# Patient Record
Sex: Female | Born: 1985
Health system: Southern US, Community
[De-identification: ages and names within clinical notes are randomized; demographics above are authoritative.]

## PROBLEM LIST (undated history)

## (undated) DIAGNOSIS — R053 Chronic cough: Secondary | ICD-10-CM

## (undated) DIAGNOSIS — F319 Bipolar disorder, unspecified: Secondary | ICD-10-CM

## (undated) DIAGNOSIS — Z87820 Personal history of traumatic brain injury: Secondary | ICD-10-CM

## (undated) DIAGNOSIS — F32A Depression, unspecified: Secondary | ICD-10-CM

## (undated) DIAGNOSIS — R569 Unspecified convulsions: Secondary | ICD-10-CM

## (undated) DIAGNOSIS — T7840XA Allergy, unspecified, initial encounter: Secondary | ICD-10-CM

## (undated) DIAGNOSIS — R05 Cough: Secondary | ICD-10-CM

## (undated) DIAGNOSIS — F445 Conversion disorder with seizures or convulsions: Secondary | ICD-10-CM

## (undated) DIAGNOSIS — G43909 Migraine, unspecified, not intractable, without status migrainosus: Secondary | ICD-10-CM

## (undated) DIAGNOSIS — Z8489 Family history of other specified conditions: Secondary | ICD-10-CM

## (undated) DIAGNOSIS — K219 Gastro-esophageal reflux disease without esophagitis: Secondary | ICD-10-CM

## (undated) DIAGNOSIS — K089 Disorder of teeth and supporting structures, unspecified: Secondary | ICD-10-CM

## (undated) DIAGNOSIS — E785 Hyperlipidemia, unspecified: Secondary | ICD-10-CM

## (undated) DIAGNOSIS — I499 Cardiac arrhythmia, unspecified: Secondary | ICD-10-CM

## (undated) DIAGNOSIS — A4902 Methicillin resistant Staphylococcus aureus infection, unspecified site: Secondary | ICD-10-CM

## (undated) DIAGNOSIS — J40 Bronchitis, not specified as acute or chronic: Secondary | ICD-10-CM

## (undated) DIAGNOSIS — M419 Scoliosis, unspecified: Secondary | ICD-10-CM

## (undated) DIAGNOSIS — O009 Unspecified ectopic pregnancy without intrauterine pregnancy: Secondary | ICD-10-CM

## (undated) DIAGNOSIS — J45909 Unspecified asthma, uncomplicated: Secondary | ICD-10-CM

## (undated) DIAGNOSIS — F419 Anxiety disorder, unspecified: Secondary | ICD-10-CM

## (undated) DIAGNOSIS — F329 Major depressive disorder, single episode, unspecified: Secondary | ICD-10-CM

## (undated) HISTORY — DX: Hyperlipidemia, unspecified: E78.5

## (undated) HISTORY — DX: Depression, unspecified: F32.A

## (undated) HISTORY — DX: Allergy, unspecified, initial encounter: T78.40XA

## (undated) HISTORY — DX: Unspecified ectopic pregnancy without intrauterine pregnancy: O00.90

## (undated) HISTORY — DX: Anxiety disorder, unspecified: F41.9

## (undated) HISTORY — DX: Unspecified convulsions: R56.9

## (undated) HISTORY — DX: Gastro-esophageal reflux disease without esophagitis: K21.9

## (undated) HISTORY — PX: ABDOMINAL HYSTERECTOMY: SHX81

## (undated) HISTORY — PX: TUBAL LIGATION: SHX77

## (undated) HISTORY — DX: Personal history of traumatic brain injury: Z87.820

## (undated) HISTORY — DX: Conversion disorder with seizures or convulsions: F44.5

## (undated) HISTORY — DX: Migraine, unspecified, not intractable, without status migrainosus: G43.909

## (undated) HISTORY — DX: Bipolar disorder, unspecified: F31.9

## (undated) HISTORY — DX: Methicillin resistant Staphylococcus aureus infection, unspecified site: A49.02

---
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
## 2004-04-06 ENCOUNTER — Emergency Department: Payer: Self-pay | Admitting: Emergency Medicine

## 2004-06-04 ENCOUNTER — Emergency Department: Payer: Self-pay | Admitting: General Practice

## 2004-07-06 ENCOUNTER — Emergency Department: Payer: Self-pay | Admitting: Emergency Medicine

## 2004-08-03 ENCOUNTER — Emergency Department: Payer: Self-pay | Admitting: General Practice

## 2004-08-08 ENCOUNTER — Emergency Department: Payer: Self-pay | Admitting: Emergency Medicine

## 2004-08-18 ENCOUNTER — Emergency Department: Payer: Self-pay | Admitting: Emergency Medicine

## 2004-08-23 ENCOUNTER — Emergency Department: Payer: Self-pay | Admitting: Emergency Medicine

## 2004-08-24 ENCOUNTER — Ambulatory Visit: Payer: Self-pay | Admitting: Emergency Medicine

## 2004-10-02 ENCOUNTER — Observation Stay: Payer: Self-pay

## 2004-11-26 ENCOUNTER — Emergency Department: Payer: Self-pay | Admitting: Emergency Medicine

## 2004-12-06 ENCOUNTER — Observation Stay: Payer: Self-pay

## 2005-01-15 ENCOUNTER — Observation Stay: Payer: Self-pay

## 2005-01-17 ENCOUNTER — Emergency Department: Payer: Self-pay | Admitting: General Practice

## 2005-02-01 ENCOUNTER — Observation Stay: Payer: Self-pay | Admitting: Obstetrics & Gynecology

## 2005-02-01 ENCOUNTER — Emergency Department: Payer: Self-pay | Admitting: Unknown Physician Specialty

## 2005-02-15 ENCOUNTER — Observation Stay: Payer: Self-pay

## 2005-02-21 ENCOUNTER — Observation Stay: Payer: Self-pay

## 2005-03-15 ENCOUNTER — Observation Stay: Payer: Self-pay | Admitting: Unknown Physician Specialty

## 2005-03-17 ENCOUNTER — Observation Stay: Payer: Self-pay | Admitting: Unknown Physician Specialty

## 2005-03-20 ENCOUNTER — Observation Stay: Payer: Self-pay | Admitting: Unknown Physician Specialty

## 2005-03-21 ENCOUNTER — Observation Stay: Payer: Self-pay

## 2005-03-21 ENCOUNTER — Other Ambulatory Visit: Payer: Self-pay

## 2005-03-29 ENCOUNTER — Observation Stay: Payer: Self-pay

## 2005-04-03 ENCOUNTER — Observation Stay: Payer: Self-pay | Admitting: Obstetrics & Gynecology

## 2005-04-07 ENCOUNTER — Inpatient Hospital Stay: Payer: Self-pay | Admitting: Obstetrics & Gynecology

## 2005-05-21 ENCOUNTER — Emergency Department: Payer: Self-pay | Admitting: Emergency Medicine

## 2005-06-20 ENCOUNTER — Emergency Department: Payer: Self-pay | Admitting: Emergency Medicine

## 2005-08-10 ENCOUNTER — Emergency Department: Payer: Self-pay | Admitting: Unknown Physician Specialty

## 2005-11-25 ENCOUNTER — Emergency Department: Payer: Self-pay | Admitting: Emergency Medicine

## 2005-12-18 ENCOUNTER — Emergency Department: Payer: Self-pay | Admitting: Emergency Medicine

## 2006-01-07 ENCOUNTER — Emergency Department: Payer: Self-pay | Admitting: Emergency Medicine

## 2006-02-26 ENCOUNTER — Emergency Department: Payer: Self-pay | Admitting: Emergency Medicine

## 2006-02-28 ENCOUNTER — Emergency Department: Payer: Self-pay | Admitting: Emergency Medicine

## 2006-04-10 ENCOUNTER — Emergency Department: Payer: Self-pay | Admitting: Emergency Medicine

## 2006-07-30 ENCOUNTER — Other Ambulatory Visit: Payer: Self-pay

## 2006-07-30 ENCOUNTER — Emergency Department: Payer: Self-pay | Admitting: General Practice

## 2006-11-14 ENCOUNTER — Ambulatory Visit: Payer: Self-pay | Admitting: Internal Medicine

## 2007-11-03 ENCOUNTER — Emergency Department: Payer: Self-pay | Admitting: Emergency Medicine

## 2007-12-08 ENCOUNTER — Emergency Department: Payer: Self-pay | Admitting: Internal Medicine

## 2008-01-25 ENCOUNTER — Emergency Department: Payer: Self-pay | Admitting: Emergency Medicine

## 2008-02-16 ENCOUNTER — Emergency Department: Payer: Self-pay | Admitting: Unknown Physician Specialty

## 2008-05-31 ENCOUNTER — Emergency Department: Payer: Self-pay | Admitting: Unknown Physician Specialty

## 2008-06-15 ENCOUNTER — Ambulatory Visit: Payer: Self-pay | Admitting: Unknown Physician Specialty

## 2008-06-25 HISTORY — PX: HERNIA REPAIR: SHX51

## 2008-07-07 ENCOUNTER — Ambulatory Visit: Payer: Self-pay | Admitting: Unknown Physician Specialty

## 2008-07-08 ENCOUNTER — Ambulatory Visit: Payer: Self-pay | Admitting: Unknown Physician Specialty

## 2008-12-08 ENCOUNTER — Emergency Department: Payer: Self-pay | Admitting: Emergency Medicine

## 2009-06-25 HISTORY — PX: ECTOPIC PREGNANCY SURGERY: SHX613

## 2009-08-28 ENCOUNTER — Emergency Department: Payer: Self-pay | Admitting: Unknown Physician Specialty

## 2009-09-05 ENCOUNTER — Emergency Department: Payer: Self-pay | Admitting: Emergency Medicine

## 2009-09-13 ENCOUNTER — Emergency Department: Payer: Self-pay | Admitting: Internal Medicine

## 2009-09-15 ENCOUNTER — Observation Stay: Payer: Self-pay | Admitting: Obstetrics & Gynecology

## 2010-01-27 ENCOUNTER — Emergency Department: Payer: Self-pay | Admitting: Unknown Physician Specialty

## 2010-02-10 ENCOUNTER — Emergency Department: Payer: Self-pay | Admitting: Unknown Physician Specialty

## 2011-03-18 ENCOUNTER — Emergency Department: Payer: Self-pay | Admitting: Emergency Medicine

## 2011-09-30 ENCOUNTER — Emergency Department: Payer: Self-pay | Admitting: Emergency Medicine

## 2011-09-30 LAB — URINALYSIS, COMPLETE
Bacteria: NONE SEEN
Bilirubin,UR: NEGATIVE
Glucose,UR: NEGATIVE mg/dL (ref 0–75)
Hyaline Cast: 1
Ketone: NEGATIVE
Nitrite: NEGATIVE
Ph: 5 (ref 4.5–8.0)
Protein: NEGATIVE
RBC,UR: 4 /HPF (ref 0–5)
Specific Gravity: 1.021 (ref 1.003–1.030)
Squamous Epithelial: 2
WBC UR: 4 /HPF (ref 0–5)

## 2011-10-14 ENCOUNTER — Emergency Department: Payer: Self-pay | Admitting: Emergency Medicine

## 2011-12-07 ENCOUNTER — Emergency Department: Payer: Self-pay | Admitting: Emergency Medicine

## 2012-06-30 ENCOUNTER — Emergency Department: Payer: Self-pay | Admitting: Emergency Medicine

## 2012-06-30 LAB — RAPID INFLUENZA A&B ANTIGENS

## 2012-06-30 LAB — BASIC METABOLIC PANEL
BUN: 11 mg/dL (ref 7–18)
Calcium, Total: 9.1 mg/dL (ref 8.5–10.1)
Co2: 24 mmol/L (ref 21–32)
Creatinine: 0.77 mg/dL (ref 0.60–1.30)
EGFR (African American): >60
EGFR (Non-African Amer.): >60
Osmolality: 267 (ref 275–301)
Potassium: 3.6 mmol/L (ref 3.5–5.1)

## 2012-06-30 LAB — CBC WITH DIFFERENTIAL/PLATELET
Basophil #: 0 10*3/uL (ref 0.0–0.1)
Eosinophil #: 0 10*3/uL (ref 0.0–0.7)
Eosinophil %: 0 %
HCT: 38.1 % (ref 35.0–47.0)
HGB: 12.9 g/dL (ref 12.0–16.0)
MCH: 29.7 pg (ref 26.0–34.0)
MCHC: 34 g/dL (ref 32.0–36.0)
MCV: 87 fL (ref 80–100)
Monocyte #: 1 x10 3/mm — ABNORMAL HIGH (ref 0.2–0.9)
Monocyte %: 9.1 %
Platelet: 201 10*3/uL (ref 150–440)
RBC: 4.36 10*6/uL (ref 3.80–5.20)
RDW: 12.5 % (ref 11.5–14.5)

## 2012-06-30 LAB — URINALYSIS, COMPLETE
Bilirubin,UR: NEGATIVE
Glucose,UR: NEGATIVE mg/dL (ref 0–75)
Leukocyte Esterase: NEGATIVE
Protein: NEGATIVE
Specific Gravity: 1.017 (ref 1.003–1.030)
Squamous Epithelial: 2
WBC UR: 1 /HPF (ref 0–5)

## 2012-09-01 ENCOUNTER — Emergency Department: Payer: Self-pay | Admitting: Emergency Medicine

## 2012-10-02 ENCOUNTER — Encounter: Payer: Self-pay | Admitting: Obstetrics and Gynecology

## 2012-10-19 ENCOUNTER — Emergency Department: Payer: Self-pay | Admitting: Emergency Medicine

## 2012-10-19 LAB — URINALYSIS, COMPLETE
Ketone: NEGATIVE
Nitrite: NEGATIVE
Ph: 8 (ref 4.5–8.0)
RBC,UR: NONE SEEN /HPF (ref 0–5)
Specific Gravity: 1.013 (ref 1.003–1.030)
Squamous Epithelial: 2

## 2012-10-19 LAB — CBC
HCT: 34.8 % — ABNORMAL LOW (ref 35.0–47.0)
HGB: 11.8 g/dL — ABNORMAL LOW (ref 12.0–16.0)
MCH: 29.8 pg (ref 26.0–34.0)
MCHC: 33.9 g/dL (ref 32.0–36.0)
MCV: 88 fL (ref 80–100)
Platelet: 246 10*3/uL (ref 150–440)
WBC: 8.8 10*3/uL (ref 3.6–11.0)

## 2012-10-19 LAB — WET PREP, GENITAL

## 2012-10-19 LAB — COMPREHENSIVE METABOLIC PANEL
Albumin: 3.3 g/dL — ABNORMAL LOW (ref 3.4–5.0)
Bilirubin,Total: 0.3 mg/dL (ref 0.2–1.0)
EGFR (Non-African Amer.): >60
Osmolality: 272 (ref 275–301)
Potassium: 3.6 mmol/L (ref 3.5–5.1)
SGOT(AST): 16 U/L (ref 15–37)
Sodium: 138 mmol/L (ref 136–145)

## 2012-10-19 LAB — HCG, QUANTITATIVE, PREGNANCY: Beta Hcg, Quant.: 54378 m[IU]/mL — ABNORMAL HIGH

## 2013-01-13 ENCOUNTER — Emergency Department: Payer: Self-pay | Admitting: Emergency Medicine

## 2013-01-13 LAB — CBC
HGB: 11 g/dL — ABNORMAL LOW (ref 12.0–16.0)
MCH: 30.9 pg (ref 26.0–34.0)
MCHC: 34.8 g/dL (ref 32.0–36.0)
MCV: 89 fL (ref 80–100)
Platelet: 241 10*3/uL (ref 150–440)
RBC: 3.55 10*6/uL — ABNORMAL LOW (ref 3.80–5.20)

## 2013-01-13 LAB — URINALYSIS, COMPLETE
Bacteria: NONE SEEN
Bilirubin,UR: NEGATIVE
Blood: NEGATIVE
Glucose,UR: NEGATIVE mg/dL (ref 0–75)
Ketone: NEGATIVE
Nitrite: NEGATIVE
Ph: 7 (ref 4.5–8.0)
Protein: NEGATIVE
RBC,UR: <1 /HPF (ref 0–5)
Specific Gravity: 1.006 (ref 1.003–1.030)
Squamous Epithelial: 2
WBC UR: 2 /HPF (ref 0–5)

## 2013-01-13 LAB — COMPREHENSIVE METABOLIC PANEL
Albumin: 3 g/dL — ABNORMAL LOW (ref 3.4–5.0)
Calcium, Total: 9.1 mg/dL (ref 8.5–10.1)
Co2: 25 mmol/L (ref 21–32)
EGFR (African American): >60
EGFR (Non-African Amer.): >60
Osmolality: 273 (ref 275–301)
Potassium: 3.7 mmol/L (ref 3.5–5.1)
SGPT (ALT): 22 U/L (ref 12–78)
Total Protein: 7.1 g/dL (ref 6.4–8.2)

## 2013-01-13 LAB — HCG, QUANTITATIVE, PREGNANCY: Beta Hcg, Quant.: 26274 m[IU]/mL — ABNORMAL HIGH

## 2013-01-29 ENCOUNTER — Observation Stay: Payer: Self-pay | Admitting: Obstetrics and Gynecology

## 2013-01-29 LAB — URINALYSIS, COMPLETE
Bilirubin,UR: NEGATIVE
Blood: NEGATIVE
Glucose,UR: NEGATIVE mg/dL (ref 0–75)
Ketone: NEGATIVE
Nitrite: NEGATIVE
Ph: 7 (ref 4.5–8.0)
Protein: NEGATIVE
RBC,UR: NONE SEEN /HPF (ref 0–5)
Squamous Epithelial: 1
WBC UR: 3 /HPF (ref 0–5)

## 2013-03-20 ENCOUNTER — Observation Stay: Payer: Self-pay | Admitting: Obstetrics and Gynecology

## 2013-04-11 ENCOUNTER — Observation Stay: Payer: Self-pay | Admitting: Advanced Practice Midwife

## 2013-04-16 ENCOUNTER — Observation Stay: Payer: Self-pay | Admitting: Obstetrics and Gynecology

## 2013-04-19 ENCOUNTER — Observation Stay: Payer: Self-pay | Admitting: Obstetrics & Gynecology

## 2013-04-21 ENCOUNTER — Inpatient Hospital Stay: Payer: Self-pay | Admitting: Obstetrics and Gynecology

## 2013-04-21 LAB — CBC WITH DIFFERENTIAL/PLATELET
Basophil %: 0.5 %
Eosinophil #: 0.1 10*3/uL (ref 0.0–0.7)
Eosinophil %: 0.8 %
HCT: 35.2 % (ref 35.0–47.0)
Lymphocyte %: 15.2 %
MCHC: 34.2 g/dL (ref 32.0–36.0)
Monocyte %: 6.8 %
Neutrophil %: 76.7 %
Platelet: 203 10*3/uL (ref 150–440)
RDW: 13.9 % (ref 11.5–14.5)

## 2013-04-22 LAB — HEMATOCRIT: HCT: 31.2 % — ABNORMAL LOW (ref 35.0–47.0)

## 2013-04-23 LAB — PATHOLOGY REPORT

## 2013-06-25 HISTORY — PX: CARPAL TUNNEL RELEASE: SHX101

## 2013-08-17 ENCOUNTER — Emergency Department: Payer: Self-pay | Admitting: Emergency Medicine

## 2013-10-20 ENCOUNTER — Emergency Department: Payer: Self-pay | Admitting: Emergency Medicine

## 2014-01-07 ENCOUNTER — Inpatient Hospital Stay: Payer: Self-pay | Admitting: Internal Medicine

## 2014-01-07 LAB — CBC WITH DIFFERENTIAL/PLATELET
Basophil #: 0.1 10*3/uL (ref 0.0–0.1)
Basophil %: 0.4 %
Eosinophil #: 0.3 10*3/uL (ref 0.0–0.7)
Eosinophil %: 2 %
HCT: 38 % (ref 35.0–47.0)
HGB: 13 g/dL (ref 12.0–16.0)
Lymphocyte #: 2.9 10*3/uL (ref 1.0–3.6)
Lymphocyte %: 18.9 %
MCH: 30.1 pg (ref 26.0–34.0)
MCHC: 34.1 g/dL (ref 32.0–36.0)
MCV: 88 fL (ref 80–100)
Monocyte #: 1.2 x10 3/mm — ABNORMAL HIGH (ref 0.2–0.9)
Monocyte %: 8 %
NEUTROS PCT: 70.7 %
Neutrophil #: 10.8 10*3/uL — ABNORMAL HIGH (ref 1.4–6.5)
Platelet: 252 10*3/uL (ref 150–440)
RBC: 4.31 10*6/uL (ref 3.80–5.20)
RDW: 13.1 % (ref 11.5–14.5)
WBC: 15.3 10*3/uL — ABNORMAL HIGH (ref 3.6–11.0)

## 2014-01-07 LAB — COMPREHENSIVE METABOLIC PANEL
ALK PHOS: 64 U/L
ALT: 18 U/L (ref 12–78)
AST: 21 U/L (ref 15–37)
Albumin: 3.8 g/dL (ref 3.4–5.0)
Anion Gap: 6 — ABNORMAL LOW (ref 7–16)
BUN: 9 mg/dL (ref 7–18)
Bilirubin,Total: 0.3 mg/dL (ref 0.2–1.0)
CALCIUM: 8.7 mg/dL (ref 8.5–10.1)
CO2: 25 mmol/L (ref 21–32)
CREATININE: 0.71 mg/dL (ref 0.60–1.30)
Chloride: 108 mmol/L — ABNORMAL HIGH (ref 98–107)
EGFR (Non-African Amer.): >60
Glucose: 122 mg/dL — ABNORMAL HIGH (ref 65–99)
OSMOLALITY: 278 (ref 275–301)
Potassium: 4 mmol/L (ref 3.5–5.1)
Sodium: 139 mmol/L (ref 136–145)
Total Protein: 7.5 g/dL (ref 6.4–8.2)

## 2014-01-09 LAB — WBC: WBC: 10.1 10*3/uL (ref 3.6–11.0)

## 2014-01-09 LAB — CREATININE, SERUM: Creatinine: 0.7 mg/dL (ref 0.60–1.30)

## 2014-01-09 LAB — VANCOMYCIN, TROUGH: VANCOMYCIN, TROUGH: 14 ug/mL (ref 10–20)

## 2014-01-12 LAB — WOUND CULTURE

## 2014-03-09 ENCOUNTER — Emergency Department: Payer: Self-pay | Admitting: Emergency Medicine

## 2014-05-23 ENCOUNTER — Emergency Department: Payer: Self-pay | Admitting: Student

## 2014-06-22 ENCOUNTER — Emergency Department: Payer: Self-pay | Admitting: Emergency Medicine

## 2014-06-25 DIAGNOSIS — Z87820 Personal history of traumatic brain injury: Secondary | ICD-10-CM

## 2014-06-25 HISTORY — DX: Personal history of traumatic brain injury: Z87.820

## 2014-08-19 ENCOUNTER — Emergency Department: Payer: Self-pay | Admitting: Emergency Medicine

## 2014-10-15 NOTE — Op Note (Signed)
PATIENT NAME:  Danielle HummerBOOTHE, Gargi M MR#:  [id number] DATE OF BIRTH:  02/03/1986  DATE OF PROCEDURE:  04/22/2013  PREOPERATIVE DIAGNOSIS: Desires permanent surgical sterilization.   POSTOPERATIVE DIAGNOSIS: Desires permanent surgical sterilization.   OPERATION PERFORMED: Bilateral postpartum tubal ligation via Pomeroy method.   ANESTHESIA USED: General.   PRIMARY SURGEON: Florina OuAndreas M. Bonney AidStaebler, MD  ESTIMATED BLOOD LOSS: Minimal.   OPERATIVE FLUIDS: 800 mL of crystalloid.   COMPLICATIONS: None.   INTRAOPERATIVE FINDINGS: Normal tubes, walked out to the fimbriated end bilaterally. There was a good bilateral cross-section of tubal ostia visualized in both tubes after excision of tubal segment and the remaining tubes were noted to be hemostatic.   SPECIMENS REMOVED: Portion of right and left tube.   CONDITION FOLLOWING PROCEDURE: Stable.   PROCEDURE IN DETAIL: Risks, benefits and alternatives of the procedure were discussed with the patient prior to proceeding to the operating room. In addition, the permanent nature of the procedure was discussed with the patient as well. The patient was taken to the operating room where general anesthesia was administered. She was positioned in the supine position, prepped and draped in the usual sterile fashion. A timeout was performed. A vertical incision was made at the base of the umbilicus approximately 3 cm in length. The subcutaneous tissue was dissected off the rectus fascia using a hemostat. The fascia was incised in the midline after tenting up using 2 hemostats using Mayo scissors. The fascial edges were tagged with a #1 Vicryl. The peritoneum was grasped with a hemostat, tented up in a similar fashion as the fascia and incised using Metzenbaum scissors. Upon gaining entry into the peritoneal cavity, the patient was airplaned to her left. A small packing sponge was placed to keep omentum and bowel out of the operative field. The right tube was identified,  walked out to its fimbriated end before walking back to the mid-isthmic portion using a Babcock clamp. The segment of tube was then doubly suture ligated using a 0 chromic wheel. The knuckle of tube that was ligated was then excised using Metzenbaum scissors. Tubal ostia were visualized. The tube remaining was hemostatic. The packing sponge was removed. The patient was airplaned to her right and the left tube was similarly walked out to the fimbriated end before being suture ligated in a similar manner. The packing sponge was removed. The previous fascial tags were tied, noting no defect in the fascia. The subcutaneous tissue was closed using 4-0 Monocryl in subcuticular fashion. Dermabond was then applied. Sponge, needle and instrument counts were correct x 2. The patient tolerated the procedure well and was taken to the recovery room in stable condition.    ____________________________ Florina OuAndreas M. Bonney AidStaebler, MD ams:jm D: 04/28/2013 15:00:14 ET T: 04/28/2013 15:48:02 ET JOB#: [id number]  cc: Florina OuAndreas M. Bonney AidStaebler, MD, <Dictator> Carmel SacramentoANDREAS Cathrine MusterM Saher Davee MD ELECTRONICALLY SIGNED 05/06/2013 8:56

## 2014-10-16 NOTE — Consult Note (Signed)
Brief Consult Note: Diagnosis: soft tissue abscess chin.   Patient was seen by consultant.   Consult note dictated.   Orders entered.   Discussed with Attending MD.   Comments: will re-eval in am possible I and D in main OR.  Electronic Signatures: Natale LayBird, Lilybelle Mayeda (MD)  (Signed 17-Jul-15 15:45)  Authored: Brief Consult Note   Last Updated: 17-Jul-15 15:45 by Natale LayBird, Shakeria Robinette (MD)

## 2014-10-16 NOTE — Consult Note (Signed)
PATIENT NAME:  [name], [name] DATE OF BIRTH:  12-Dec-1985  DATE OF CONSULTATION:  01/08/2014  REFERRING PHYSICIAN:   CONSULTING PHYSICIAN:  Brantlee Hinde A. Egbert GaribaldiBird, MD  HISTORY:  A 29 year old female admitted to the hospital with cellulitis of her chin starting several days ago. It is exquisitely painful. She has been placed on intravenous antibiotics. Medical services asked me for evaluation and possible incision and drainage.   ALLERGIES: MULTIPLE MEDICATIONS IN THE CHART.   PAST MEDICAL HISTORY: Bipolar disorder.   FAMILY HISTORY: Significant for hypertension.   SOCIAL HISTORY: Smokes tobacco. No alcohol and no drug use.   HOME MEDICATIONS:  None   PHYSICAL EXAMINATION: The patient's examination demonstrates significant induration, cellulitis, and purulent drainage from her chin. There is poor dentition. There does not appear to be an alveolar process to account for this.   LABORATORY VALUES: Noted.   IMPRESSION: Facial cellulitis, partially drained.   PLAN: Warm compresses.  Continue IV antibiotics.  Reassess in the morning.    ____________________________ Redge GainerMark A. Egbert GaribaldiBird, MD mab:ts D: 01/08/2014 18:53:08 ET T: 01/08/2014 19:54:11 ET JOB#: [id number]  cc: Loraine LericheMark A. Egbert GaribaldiBird, MD, <Dictator> Raynald KempMARK A Manus Weedman MD ELECTRONICALLY SIGNED 01/13/2014 10:54

## 2014-10-16 NOTE — H&P (Signed)
PATIENT NAME:  [name], [name] MR#:  Ross DATE OF BIRTH:  1986-05-15  DATE OF ADMISSION:  01/07/2014  REFERRING PHYSICIAN:  Physician assistant Sam Bullard.   PRIMARY CARE PHYSICIAN:  None.   CHIEF COMPLAINT:  Face pain.   HISTORY OF PRESENT ILLNESS:  A 29 year old Caucasian female with history of hyperlipidemia, bipolar disorder not otherwise specified, presenting with face pain.  She describes one day duration of a pimple located over her chin which she apparently scratched and squeezed and noted some serosanguineous drainage.  Now she has subsequently developed pain, 10 out of 10, throbbing of her chin, radiating to her right mandible, worse with talking and eating.  No relieving factors.  Also denotes having redness and swelling.  Went to an urgent care clinic.  They gave her a dose of IM ceftriaxone and she was sent in to the hospital for further work-up and evaluation.  Denies any fevers, chills, further symptomatology.   REVIEW OF SYSTEMS:  CONSTITUTIONAL:  Denies fevers, chills, fatigue.  EYES:  Denies blurry vision, double vision or eye pain.  EARS, NOSE, THROAT:  Denies tinnitus, ear pain, hearing loss.  Denies any difficulty swallowing.  RESPIRATORY:  Denies cough, wheeze, shortness of breath.  CARDIOVASCULAR:  Denies chest pain, palpations, edema.  GASTROINTESTINAL:  Denies nausea, vomiting, diarrhea, abdominal pain.  GENITOURINARY:  Denies dysuria, hematuria.  ENDOCRINE:  Denies nocturia or thyroid problems.  HEMATOLOGY AND LYMPHATIC:  Denies easy bruising, bleeding.  SKIN:  Positive for lesion as described above on her chin with surrounding erythema.  MUSCULOSKELETAL:  Denies any pain in neck, back, shoulder, knees, hips or arthritic symptoms.  NEUROLOGIC:  Denies paralysis, paresthesias. PSYCHIATRIC:  Denies anxiety or depressive symptoms.  Otherwise, full review of systems performed by me is negative.   PAST MEDICAL HISTORY:  Bipolar disorder not otherwise specified,  asthma, as well as hyperlipidemia.   SOCIAL HISTORY:  Positive for tobacco use 1-1/2 packs daily.  Denies any alcohol or drug usage.   FAMILY HISTORY:  Positive for hypertension, diabetes.   ALLERGIES:  ASPIRIN, CODEINE, ROBITUSSIN, CHOCOLATE, NUTS AND STRAWBERRIES.   HOME MEDICATIONS:  None.   PHYSICAL EXAMINATION: VITAL SIGNS:  Temperature 98.1, heart rate 102, respirations 18, blood pressure 127/61, saturating 99% on room air.  Weight 67.1 kg, BMI 23.9.  GENERAL:  Well-nourished, well-developed Caucasian female, currently in minimal distress secondary to pain.  HEAD:  Normocephalic, atraumatic.  EYES:  Pupils equal, round, reactive to light.  Extraocular muscles intact.  No scleral icterus.  MOUTH:  Moist mucous membranes.  Dentition intact.  No abscess noted.   EAR, NOSE, THROAT:  Clear without exudates.  No external lesions.  NECK:  Supple.  No thyromegaly.  No nodules.  No JVD.  PULMONARY:  Clear to auscultation bilaterally without wheezes, rales or rhonchi.  No use of accessory muscles.  Good respiratory effort.  Chest nontender to palpation.  CARDIOVASCULAR:  S1, S2, regular rate and rhythm.  No murmurs, rubs, or gallops.  No edema.  Pedal pulses 2+ bilaterally.  GASTROINTESTINAL:  Soft, nontender, nondistended.  No masses.  Positive bowel sounds.  No hepatosplenomegaly.  MUSCULOSKELETAL:  There is minor erythema and edema over the chin.  Otherwise, no further swelling or clubbing.  Range of motion full in all extremities.  NEUROLOGIC:  Cranial nerves II through XII intact.  No gross focal neurological deficits.  Sensation intact.  Reflexes intact.  SKIN:  There is a small, approximately 2 x 2 cm punctate lesion with serosanguineous drainage  over her chin with surrounding erythema and edema which is warm to touch as well as elicits pain on palpation.  Otherwise, no further lesions, rashes, cyanosis.  Skin warm, dry.  Turgor intact.  PSYCHIATRIC:  Mood and affect within normal limits.   The patient is awake, alert, oriented x 3.  Insight and judgment intact.   LABORATORY DATA:  Sodium 139, potassium 4, chloride 108, bicarb 25, BUN 9, creatinine 0.71, glucose 122.  LFTs within normal limits.  WBC 15.3, hemoglobin 13, platelets of 252.  CT performed which reveals suspected cellulitis over the chin and anterior aspect of the right submandible without associated abscess.  No evidence of osteomyelitis either.   ASSESSMENT AND PLAN:  A 29 year old Caucasian female with history of hyperlipidemia, bipolar disorder not otherwise specified, presenting with essentially cellulitis of the face.  1.  Sepsis:  Meeting septic criteria by leukocytosis and heart rate on arrival.  Sepsis secondary to cellulitis.  Antibiotic coverage with vancomycin, already received intramuscular ceftriaxone prior to arrival in the hospital, received clindamycin in the Emergency Department.  Provide intravenous fluid hydration to keep mean arterial pressure greater than 65 as well as pain control and initiation of bowel regimen.  2.  Venous thromboembolism prophylaxis with heparin subQ.  3.  CODE STATUS:  THE PATIENT IS A FULL CODE.   TIME SPENT:  45 minutes.    ____________________________ Cletis Athens. Garielle Mroz, MD dkh:ea D: 01/07/2014 23:08:50 ET T: 01/07/2014 23:30:53 ET JOB#: [id number]  cc: Cletis Athens. Edris Friedt, MD, <Dictator> Stayce Delancy Synetta Shadow MD ELECTRONICALLY SIGNED 01/08/2014 21:56

## 2014-10-16 NOTE — Discharge Summary (Signed)
PATIENT NAME:  [name], [name] MR#:  [id number] DATE OF BIRTH:  12-22-1985  DATE OF ADMISSION:  01/07/2014 DATE OF DISCHARGE:  01/10/2014  PRESENTING COMPLAINT: Swelling, pain, and tenderness on the anterior chin.    DISCHARGE DIAGNOSIS:  Methicillin-resistant Staphylococcus aureus, anterior chin abscess.   CODE STATUS: Full code.   MEDICATIONS:  1. Acetaminophen/oxycodone 5/325 one every 6 hours as needed p.r.n.  2. Ibuprofen 400 mg one every 8 hours with meals for 3-4 days, then as needed.  3. Tylenol 325 mg 2 tablets every 4 hours as needed.  4. Bactrim double-strength 1 tablet b.i.d.   FOLLOWUP: With Dr. Egbert GaribaldiBird in 1-2 weeks.   LABORATORY: Wound culture is positive for MRSA. White count is 10.1. Creatinine is 0.7.   SURGICAL CONSULTATION: Mark A. Egbert GaribaldiBird, MD  HOSPITAL COURSE: Serena CroissantHeather Martian is a 29 year old, Caucasian female with history of hyperlipidemia, bipolar disorder, presents to the Emergency Room with complaints of: 1.  Abscess/pain, tenderness, and redness over the anterior  chin. She was found to have MRSA  abscess, which started draining on its own. She was started on IV vancomycin. Surgery saw the patient and recommended no further I and D since the abscess was draining on its own. Swelling came down. Since swelling improved, the patient's antibiotics were changed to p.o. Bactrim. She will finish a 10-day course.  2.  Bipolar disorder, not on any medications.  3.  Leukocytosis due to abscess.   Hospital stay otherwise remained stable. The patient remained a full code.   TIME SPENT: 40 minutes    ____________________________ Jearl KlinefelterSona A. Allena KatzPatel, MD sap:je D: 01/12/2014 10:41:00 ET T: 01/12/2014 11:45:54 ET JOB#: [id number]  cc: Kendricks Reap A. Allena KatzPatel, MD, <Dictator> Willow OraSONA A Trinten Boudoin MD ELECTRONICALLY SIGNED 01/19/2014 15:44

## 2014-11-02 NOTE — H&P (Signed)
L&D Evaluation:  History Expanded:  HPI 33 weeks, G3P1011 spotting and contractions since 2300.  Sex last night.  Fall 2 days ago, min trauma. No ROM.  [facility], no compl. Prior ectopic w surgery.   Gravida 3   Term 1   PreTerm 0   Abortion 1   Living 1   Group B Strep Results Maternal (Result >5wks must be treated as unknown) unknown/result > 5 weeks ago   Presents with contractions   Patient's Medical History No Chronic Illness   Patient's Surgical History Lap for ectopic   Medications Pre Natal Vitamins   Allergies ASA, Codeine, Robitussin   Social History none   Family History Non-Contributory   ROS:  ROS All systems were reviewed.  HEENT, CNS, GI, GU, Respiratory, CV, Renal and Musculoskeletal systems were found to be normal.   Exam:  Vital Signs stable   General no apparent distress   Mental Status clear   Abdomen gravid, non-tender   Estimated Fetal Weight Average for gestational age   Back no CVAT   Edema no edema   Pelvic no external lesions, FT/30/-3   Mebranes Intact   FHT normal rate with no decels   Ucx regular   Skin dry   Impression:  Impression Eval for Preterm Labor at 33 weeks, no cervical change so will treat uterine ctxs/irritability w fluids and possible terbutaline or procardia.   Plan:  Plan EFM/NST, monitor contractions and for cervical change   Comments Fetal Well-being Reassuring   Follow Up Appointment already scheduled   Electronic Signatures: Letitia LibraHarris, Allyne Hebert Paul (MD)  (Signed 26-Sep-14 07:51)  Authored: L&D Evaluation   Last Updated: 26-Sep-14 07:51 by Letitia LibraHarris, [place]bury Hair Paul (MD)

## 2014-11-02 NOTE — H&P (Signed)
L&D Evaluation:  History Expanded:  HPI 26 weeks, G3P1011 DFM and mild crampy abd pains. [facility], no compl. Prior ectopic w surgery.   Gravida 3   Term 1   PreTerm 0   Abortion 1   Living 1   Presents with decreased fetal movement   Patient's Medical History No Chronic Illness   Patient's Surgical History Lap for ectopic   Medications Pre Natal Vitamins   Allergies ASA, Codeine, Robitussin   Social History none   Family History Non-Contributory   ROS:  ROS All systems were reviewed.  HEENT, CNS, GI, GU, Respiratory, CV, Renal and Musculoskeletal systems were found to be normal.   Exam:  General no apparent distress   Mental Status clear   Abdomen gravid, non-tender   Estimated Fetal Weight Average for gestational age   Edema no edema   FHT normal rate with no decels   Ucx absent   Impression:  Impression DFM   Plan:  Plan DFM. +FHTs. Fetal Well-being Reassuring   Comments Office follow-up   Follow Up Appointment already scheduled   Electronic Signatures: Letitia LibraHarris, Robert Paul (MD)  (Signed 07-Aug-14 13:29)  Authored: L&D Evaluation   Last Updated: 07-Aug-14 13:29 by Letitia LibraHarris, Robert Paul (MD)

## 2014-11-02 NOTE — H&P (Signed)
L&D Evaluation:  History Expanded:  HPI 29 yo G3P2012 at 37w 5days who who presents with contractions that started at 330am, she is having ctx every 2-5 minutes and they are painful, she desires a BTL, first sin has autism, pt is Rh neg, GBS neg, history of preeclampsia, got tDap on 03/04/13,   Gravida 3   Term 2   PreTerm 0   Abortion 1   Living 2   Blood Type (Maternal) O negative   Group B Strep Results Maternal (Result >5wks must be treated as unknown) negative   Maternal HIV Negative   Maternal Syphilis Ab Nonreactive   Maternal Varicella Immune   Rubella Results (Maternal) immune   Maternal T-Dap Immune   [facility]   Presents with contractions   Patient's Medical History No Chronic Illness   Patient's Surgical History none   Medications Pre Natal Vitamins   Allergies zantac, zyrtec, mucinex, albuterol   Social History none   Family History Non-Contributory  son has autism,   ROS:  ROS All systems were reviewed.  HEENT, CNS, GI, GU, Respiratory, CV, Renal and Musculoskeletal systems were found to be normal.   Exam:  Vital Signs stable   Urine Protein not completed   General no apparent distress   Mental Status clear   Chest clear   Heart normal sinus rhythm   Abdomen gravid, tender with contractions   Estimated Fetal Weight Average for gestational age   Fetal Position vertex   Back no CVAT   Edema no edema   Reflexes 1+   Clonus positive   Pelvic no external lesions   Mebranes Intact   FHT normal rate with no decels   Fetal Heart Rate 140   Ucx irregular   Skin dry   Lymph no lymphadenopathy   Impression:  Impression active labor   Plan:  Plan EFM/NST, monitor contractions and for cervical change   Comments admit and start pain meds get blood drawn ot desires epidural and BTL   Follow Up Appointment need to schedule. in 6 weeks   Electronic Signatures: Adria DevonKlett, Canon Gola (MD)  (Signed 28-Oct-14  08:21)  Authored: L&D Evaluation   Last Updated: 28-Oct-14 08:21 by Adria DevonKlett, Blanka Rockholt (MD)

## 2014-12-13 ENCOUNTER — Encounter: Payer: Self-pay | Admitting: *Deleted

## 2014-12-13 ENCOUNTER — Emergency Department: Admission: EM | Admit: 2014-12-13 | Discharge: 2014-12-13 | Discharge status: Absent without leave | Payer: Self-pay

## 2014-12-13 ENCOUNTER — Emergency Department
Admission: EM | Admit: 2014-12-13 | Discharge: 2014-12-13 | Disposition: A | Discharge status: Home / Self Care | Payer: Managed Care, Other (non HMO) | Attending: Emergency Medicine | Admitting: Emergency Medicine

## 2014-12-13 ENCOUNTER — Emergency Department: Payer: Managed Care, Other (non HMO)

## 2014-12-13 DIAGNOSIS — W01198A Fall on same level from slipping, tripping and stumbling with subsequent striking against other object, initial encounter: Secondary | ICD-10-CM | POA: Diagnosis not present

## 2014-12-13 DIAGNOSIS — Y92009 Unspecified place in unspecified non-institutional (private) residence as the place of occurrence of the external cause: Secondary | ICD-10-CM | POA: Insufficient documentation

## 2014-12-13 DIAGNOSIS — Y9389 Activity, other specified: Secondary | ICD-10-CM | POA: Diagnosis not present

## 2014-12-13 DIAGNOSIS — Y998 Other external cause status: Secondary | ICD-10-CM | POA: Insufficient documentation

## 2014-12-13 DIAGNOSIS — S0990XA Unspecified injury of head, initial encounter: Secondary | ICD-10-CM

## 2014-12-13 DIAGNOSIS — Z3202 Encounter for pregnancy test, result negative: Secondary | ICD-10-CM | POA: Insufficient documentation

## 2014-12-13 HISTORY — DX: Bipolar disorder, unspecified: F31.9

## 2014-12-13 HISTORY — DX: Unspecified asthma, uncomplicated: J45.909

## 2014-12-13 LAB — URINE DRUG SCREEN, QUALITATIVE (ARMC ONLY)
AMPHETAMINES, UR SCREEN: NOT DETECTED
BARBITURATES, UR SCREEN: NOT DETECTED
Benzodiazepine, Ur Scrn: NOT DETECTED
Cannabinoid 50 Ng, Ur ~~LOC~~: NOT DETECTED
Cocaine Metabolite,Ur ~~LOC~~: NOT DETECTED
MDMA (Ecstasy)Ur Screen: NOT DETECTED
Methadone Scn, Ur: NOT DETECTED
OPIATE, UR SCREEN: NOT DETECTED
Phencyclidine (PCP) Ur S: NOT DETECTED
TRICYCLIC, UR SCREEN: NOT DETECTED

## 2014-12-13 LAB — POCT PREGNANCY, URINE: PREG TEST UR: NEGATIVE

## 2014-12-13 MED ORDER — ONDANSETRON 4 MG PO TBDP
ORAL_TABLET | ORAL | Status: AC
  Filled 2014-12-13: qty 1

## 2014-12-13 MED ORDER — IBUPROFEN 600 MG PO TABS
ORAL_TABLET | ORAL | Status: AC
  Filled 2014-12-13: qty 1

## 2014-12-13 MED ORDER — IBUPROFEN 600 MG PO TABS
600.0000 mg | ORAL_TABLET | Freq: Once | ORAL | Status: AC
  Administered 2014-12-13: 600 mg via ORAL

## 2014-12-13 MED ORDER — ONDANSETRON 4 MG PO TBDP
4.0000 mg | ORAL_TABLET | Freq: Once | ORAL | Status: AC
  Administered 2014-12-13: 4 mg via ORAL

## 2014-12-13 NOTE — ED Notes (Signed)
Pt brought in via ems from home with a fall.  Pt leaned over to pick up something on the floor and fell striking the corner of a cabinet.  Pt has a hematoma to left forehead.  Vomited x 1.  Pt had loc for unknown amount of time.  Family threw water on patient to awaken her.  Clothes wet on arrival.  md at bedside.  Pt easily aroused, but has a headache and nausea.  Iv infusing.  Skin warm and dry.

## 2014-12-13 NOTE — Discharge Instructions (Signed)
Please seek medical attention for any high fevers, chest pain, shortness of breath, change in behavior, persistent vomiting, bloody stool or any other new or concerning symptoms.  Concussion A concussion is a brain injury. It is caused by:  A hit to the head.  A quick and sudden movement (jolt) of the head or neck. A concussion is usually not life threatening. Even so, it can cause serious problems. If you had a concussion before, you may have concussion-like problems after a hit to your head. HOME CARE General Instructions  Follow your doctor's directions carefully.  Take medicines only as told by your doctor.  Only take medicines your doctor says are safe.  Do not drink alcohol until your doctor says it is okay. Alcohol and some drugs can slow down healing. They can also put you at risk for further injury.  If you are having trouble remembering things, write them down.  Try to do one thing at a time if you get distracted easily. For example, do not watch TV while making dinner.  Talk to your family members or close friends when making important decisions.  Follow up with your doctor as told.  Watch your symptoms. Tell others to do the same. Serious problems can sometimes happen after a concussion. Older adults are more likely to have these problems.  Tell your teachers, school nurse, school counselor, coach, Event organiser, or work Production designer, theatre/television/film about your concussion. Tell them about what you can or cannot do. They should watch to see if:  It gets even harder for you to pay attention or concentrate.  It gets even harder for you to remember things or learn new things.  You need more time than normal to finish things.  You become annoyed (irritable) more than before.  You are not able to deal with stress as well.  You have more problems than before.  Rest. Make sure you:  Get plenty of sleep at night.  Go to sleep early.  Go to bed at the same time every day. Try to wake  up at the same time.  Rest during the day.  Take naps when you feel tired.  Limit activities where you have to think a lot or concentrate. These include:  Doing homework.  Doing work related to a job.  Watching TV.  Using the computer. Returning To Your Regular Activities Return to your normal activities slowly, not all at once. You must give your body and brain enough time to heal.   Do not play sports or do other athletic activities until your doctor says it is okay.  Ask your doctor when you can drive, ride a bicycle, or work other vehicles or machines. Never do these things if you feel dizzy.  Ask your doctor about when you can return to work or school. Preventing Another Concussion It is very important to avoid another brain injury, especially before you have healed. In rare cases, another injury can lead to permanent brain damage, brain swelling, or death. The risk of this is greatest during the first 7-10 days after your injury. Avoid injuries by:   Wearing a seat belt when riding in a car.  Not drinking too much alcohol.  Avoiding activities that could lead to a second concussion (such as contact sports).  Wearing a helmet when doing activities like:  Biking.  Skiing.  Skateboarding.  Skating.  Making your home safer by:  Removing things from the floor or stairways that could make you trip.  Using  grab bars in bathrooms and handrails by stairs.  Placing non-slip mats on floors and in bathtubs.  Improve lighting in dark areas. GET HELP IF:  It gets even harder for you to pay attention or concentrate.  It gets even harder for you to remember things or learn new things.  You need more time than normal to finish things.  You become annoyed (irritable) more than before.  You are not able to deal with stress as well.  You have more problems than before.  You have problems keeping your balance.  You are not able to react quickly when you  should. Get help if you have any of these problems for more than 2 weeks:   Lasting (chronic) headaches.  Dizziness or trouble balancing.  Feeling sick to your stomach (nausea).  Seeing (vision) problems.  Being affected by noises or light more than normal.  Feeling sad, low, down in the dumps, blue, gloomy, or empty (depressed).  Mood changes (mood swings).  Feeling of fear or nervousness about what may happen (anxiety).  Feeling annoyed.  Memory problems.  Problems concentrating or paying attention.  Sleep problems.  Feeling tired all the time. GET HELP RIGHT AWAY IF:   You have bad headaches or your headaches get worse.  You have weakness (even if it is in one hand, leg, or part of the face).  You have loss of feeling (numbness).  You feel off balance.  You keep throwing up (vomiting).  You feel tired.  One black center of your eye (pupil) is larger than the other.  You twitch or shake violently (convulse).  Your speech is not clear (slurred).  You are more confused, easily angered (agitated), or annoyed than before.  You have more trouble resting than before.  You are unable to recognize people or places.  You have neck pain.  It is difficult to wake you up.  You have unusual behavior changes.  You pass out (lose consciousness). MAKE SURE YOU:   Understand these instructions.  Will watch your condition.  Will get help right away if you are not doing well or get worse. Document Released: 05/30/2009 Document Revised: 10/26/2013 Document Reviewed: 01/01/2013 [facility] Patient Information 2015 [place], [place]. This information is not intended to replace advice given to you by your health care provider. Make sure you discuss any questions you have with your health care provider.

## 2014-12-13 NOTE — ED Notes (Signed)
AAOx3.  Skin warm and dry.  Denies c/o nausea.

## 2014-12-13 NOTE — ED Notes (Signed)
AAOx3.  Skin warm and dry.  Ambulates independently to Bathroom.  Gait steady.  NAD.

## 2014-12-13 NOTE — ED Provider Notes (Signed)
[facility] Emergency Department Provider Note  ____________________________________________  Time seen: On EMS arrival  I have reviewed the triage vital signs and the nursing notes.   HISTORY  Chief Complaint No chief complaint on file.   History limited by: Not Limited   HPI [name] is a 29 y.o. female who presents to the emergency department via EMS because of decreased responsiveness. The report is that the patient fell 5-6 hours prior to arrival. She states she was bending over to pick up something from the floor when she hit a cabinet with her head. Apparently since then she has been less responsive. She has felt nauseous. The delay in transport to the hospital is secondary to needing to get the kids to grandmother's house. The patient denies any illicit drug use today. Denies any other pain or neck pain.     No past medical history on file.  There are no active problems to display for this patient.   No past surgical history on file.  No current outpatient prescriptions on file.  Allergies Review of patient's allergies indicates not on file.  No family history on file.  Social History History  Substance Use Topics  . Smoking status: Not on file  . Smokeless tobacco: Not on file  . Alcohol Use: Not on file    Review of Systems  Constitutional: Negative for fever. Cardiovascular: Negative for chest pain. Respiratory: Negative for shortness of breath. Gastrointestinal: Negative for abdominal pain. Positive for nausea. Genitourinary: Negative for dysuria. Musculoskeletal: Negative for back pain. Skin: Negative for rash. Neurological: Positive for headache   10-point ROS otherwise negative.  ____________________________________________   PHYSICAL EXAM:  VITAL SIGNS:  98.7 F (37.1 C)  98  20  137/81 mmHg  99 %     Constitutional: Awake and alert. Eyes: Conjunctivae are normal. PERRL. Normal extraocular  movements. ENT   Head: Normocephalic and atraumatic.   Nose: No congestion/rhinnorhea.   Mouth/Throat: Mucous membranes are moist.   Neck: No stridor. Hematological/Lymphatic/Immunilogical: No cervical lymphadenopathy. Cardiovascular: Normal rate, regular rhythm.  No murmurs, rubs, or gallops. Respiratory: Normal respiratory effort without tachypnea nor retractions. Breath sounds are clear and equal bilaterally. No wheezes/rales/rhonchi. Gastrointestinal: Soft and nontender. No distention.  Genitourinary: Deferred Musculoskeletal: Normal range of motion in all extremities. No joint effusions.  No lower extremity tenderness nor edema. Neurologic:  Normal speech and language. No gross focal neurologic deficits are appreciated. Speech is normal.  Skin:  Skin is warm, dry and intact. No rash noted. Psychiatric: Mood and affect are normal. Speech and behavior are normal. Patient exhibits appropriate insight and judgment.  ____________________________________________    LABS (pertinent positives/negatives)  Labs Reviewed  URINE DRUG SCREEN, QUALITATIVE ([facility] ONLY)  POC URINE PREG, ED  POCT PREGNANCY, URINE     ____________________________________________   EKG  None  ____________________________________________    RADIOLOGY  CT head IMPRESSION: Normal brain.  ____________________________________________   PROCEDURES  Procedure(s) performed: None  Critical Care performed: No  ____________________________________________   INITIAL IMPRESSION / ASSESSMENT AND PLAN / ED COURSE  Pertinent labs & imaging results that were available during my care of the patient were reviewed by me and considered in my medical decision making (see chart for details).  Patient presented because of head injury and some altered mental status. Initial exam patient awake, alert, not answering 100% appropriately. CT head was done which was normal. Whilst patient was observed here  in the ED she received went back to being alert  and oriented 4. Able to ambulate without any difficulty. The patient likely suffered a concussion. Discussed concussion with patient. Feel patient is safe for discharge home.  ____________________________________________   FINAL CLINICAL IMPRESSION(S) / ED DIAGNOSES  Final diagnoses:  Minor head injury, initial encounter     Phineas Semen, MD 12/13/14 1933

## 2015-01-02 ENCOUNTER — Emergency Department
Admission: EM | Admit: 2015-01-02 | Discharge: 2015-01-03 | Disposition: A | Discharge status: Home / Self Care | Payer: Managed Care, Other (non HMO) | Attending: Emergency Medicine | Admitting: Emergency Medicine

## 2015-01-02 ENCOUNTER — Other Ambulatory Visit: Payer: Self-pay

## 2015-01-02 ENCOUNTER — Encounter: Payer: Self-pay | Admitting: Emergency Medicine

## 2015-01-02 DIAGNOSIS — Z79899 Other long term (current) drug therapy: Secondary | ICD-10-CM | POA: Insufficient documentation

## 2015-01-02 DIAGNOSIS — R569 Unspecified convulsions: Secondary | ICD-10-CM | POA: Diagnosis not present

## 2015-01-02 DIAGNOSIS — R55 Syncope and collapse: Secondary | ICD-10-CM | POA: Diagnosis present

## 2015-01-02 DIAGNOSIS — Z720 Tobacco use: Secondary | ICD-10-CM | POA: Insufficient documentation

## 2015-01-02 DIAGNOSIS — N390 Urinary tract infection, site not specified: Secondary | ICD-10-CM | POA: Insufficient documentation

## 2015-01-02 DIAGNOSIS — F445 Conversion disorder with seizures or convulsions: Secondary | ICD-10-CM

## 2015-01-02 DIAGNOSIS — Z3202 Encounter for pregnancy test, result negative: Secondary | ICD-10-CM | POA: Diagnosis not present

## 2015-01-02 HISTORY — DX: Scoliosis, unspecified: M41.9

## 2015-01-02 LAB — CBC WITH DIFFERENTIAL/PLATELET
Basophils Absolute: 0.1 10*3/uL (ref 0–0.1)
Basophils Relative: 1 %
EOS ABS: 0.2 10*3/uL (ref 0–0.7)
Eosinophils Relative: 2 %
HCT: 39.7 % (ref 35.0–47.0)
Hemoglobin: 13.2 g/dL (ref 12.0–16.0)
LYMPHS PCT: 32 %
Lymphs Abs: 3.2 10*3/uL (ref 1.0–3.6)
MCH: 30.2 pg (ref 26.0–34.0)
MCHC: 33.3 g/dL (ref 32.0–36.0)
MCV: 90.7 fL (ref 80.0–100.0)
Monocytes Absolute: 1 10*3/uL — ABNORMAL HIGH (ref 0.2–0.9)
Monocytes Relative: 10 %
NEUTROS PCT: 55 %
Neutro Abs: 5.6 10*3/uL (ref 1.4–6.5)
PLATELETS: 222 10*3/uL (ref 150–440)
RBC: 4.38 MIL/uL (ref 3.80–5.20)
RDW: 12.9 % (ref 11.5–14.5)
WBC: 10.2 10*3/uL (ref 3.6–11.0)

## 2015-01-02 LAB — URINALYSIS COMPLETE WITH MICROSCOPIC (ARMC ONLY)
BACTERIA UA: NONE SEEN
BILIRUBIN URINE: NEGATIVE
GLUCOSE, UA: NEGATIVE mg/dL
Hgb urine dipstick: NEGATIVE
Ketones, ur: NEGATIVE mg/dL
NITRITE: NEGATIVE
Protein, ur: NEGATIVE mg/dL
Specific Gravity, Urine: 1.008 (ref 1.005–1.030)
pH: 6 (ref 5.0–8.0)

## 2015-01-02 LAB — BASIC METABOLIC PANEL
ANION GAP: 10 (ref 5–15)
BUN: 16 mg/dL (ref 6–20)
CALCIUM: 9 mg/dL (ref 8.9–10.3)
CO2: 23 mmol/L (ref 22–32)
CREATININE: 0.71 mg/dL (ref 0.44–1.00)
Chloride: 105 mmol/L (ref 101–111)
GFR calc Af Amer: >60 mL/min (ref >60)
GFR calc non Af Amer: >60 mL/min (ref >60)
GLUCOSE: 97 mg/dL (ref 65–99)
Potassium: 3.7 mmol/L (ref 3.5–5.1)
Sodium: 138 mmol/L (ref 135–145)

## 2015-01-02 LAB — PREGNANCY, URINE: PREG TEST UR: NEGATIVE

## 2015-01-02 MED ORDER — LORAZEPAM 1 MG PO TABS
1.0000 mg | ORAL_TABLET | Freq: Once | ORAL | Status: AC
  Administered 2015-01-03: 1 mg via ORAL

## 2015-01-02 MED ORDER — SULFAMETHOXAZOLE-TRIMETHOPRIM 800-160 MG PO TABS
1.0000 | ORAL_TABLET | Freq: Once | ORAL | Status: AC
  Administered 2015-01-03: 1 via ORAL

## 2015-01-02 MED ORDER — KETOROLAC TROMETHAMINE 30 MG/ML IJ SOLN
30.0000 mg | Freq: Once | INTRAMUSCULAR | Status: AC
  Administered 2015-01-03: 30 mg via INTRAVENOUS

## 2015-01-02 NOTE — ED Notes (Signed)
Per pt has been having "fainting spells" for 2 weeks and believes it is from a concussion 3 weeks ago. Has not been seen for the syncope episodes

## 2015-01-02 NOTE — ED Notes (Signed)
Pt had one of her "seizures" in triage. Pt had her head down, then started shaky her arms. Then stopped when i lifted her arm over her head and she slowly lowered it to her lap. Then was immediately alert and oriented. Pt also not incontinent with the episode.

## 2015-01-02 NOTE — ED Provider Notes (Signed)
[facility] Emergency Department Provider Note  ____________________________________________  Time seen: Approximately 11:12 PM  I have reviewed the triage vital signs and the nursing notes.   HISTORY  Chief Complaint Loss of Consciousness    HPI [name] is a 29 y.o. female who presents from home to the ED accompanied by spouse for a chief complaint of "seizures". Patient states since her concussion 3 weeks ago (patient seen in the ED with negative CT head) she has been experiencing "fainting spells". Tonight she was at rest when her spouse says sheclosed her eyes and became "shaky". States she did this 3 times in the space of 5 minutes. There was no urinary incontinence and patient did not bite her tongue. Patient was not confused after the episodes. States she has a baseline level of stress in her life but she was not emotionally upset tonight. Denies fever, chills, chest pain, shortness of breath, headache, nausea, vomiting, diarrhea, weakness, numbness, tingling. Patient has no prior history of seizure disorder. Complains of 5/10 muscle aching in legs.   Past Medical History  Diagnosis Date  . Asthma   . Bipolar 1 disorder   . Scoliosis     There are no active problems to display for this patient.   Past Surgical History  Procedure Laterality Date  . Hernia repair    . Tubal ligation      Current Outpatient Rx  Name  Route  Sig  Dispense  Refill  . cetirizine (ZYRTEC) 10 MG tablet   Oral   Take 10 mg by mouth daily.         . divalproex (DEPAKOTE) 500 MG DR tablet   Oral   Take 500 mg by mouth 1 day or 1 dose.         Marland Kitchen FLUoxetine (PROZAC) 20 MG tablet   Oral   Take 20 mg by mouth daily.         . montelukast (SINGULAIR) 10 MG tablet   Oral   Take 10 mg by mouth at bedtime.         . Multiple Vitamin (MULTIVITAMIN) tablet   Oral   Take 1 tablet by mouth daily.         . traMADol (ULTRAM) 50 MG tablet   Oral  Take by mouth every 6 (six) hours as needed.           Allergies Codeine and Robitussin (alcohol free)  Family history No history of seizures   Social History History  Substance Use Topics  . Smoking status: Current Every Day Smoker -- 1.00 packs/day  . Smokeless tobacco: Not on file  . Alcohol Use: No  Denies illicit drug use  Review of Systems Constitutional: No fever/chills Eyes: No visual changes. ENT: No sore throat. Cardiovascular: Denies chest pain. Respiratory: Denies shortness of breath. Gastrointestinal: No abdominal pain.  No nausea, no vomiting.  No diarrhea.  No constipation. Genitourinary: Positive for urinary frequency. Negative for dysuria. Musculoskeletal: Negative for back pain. Skin: Negative for rash. Neurological: Positive for "seizures". Negative for headaches, focal weakness or numbness.  10-point ROS otherwise negative.  ____________________________________________   PHYSICAL EXAM:  VITAL SIGNS: ED Triage Vitals  Enc Vitals Group     BP 01/02/15 2025 110/80 mmHg     Pulse Rate 01/02/15 2025 96     Resp --      Temp 01/02/15 2025 98.3 F (36.8 C)     Temp Source 01/02/15 2025 Oral  SpO2 01/02/15 2025 100 %     Weight 01/02/15 2025 145 lb (65.772 kg)     Height 01/02/15 2025 5\' 5"  (1.651 m)     Head Cir --      Peak Flow --      Pain Score 01/02/15 2029 7     Pain Loc --      Pain Edu? --      Excl. in GC? --     Constitutional: Alert and oriented. Well appearing and in no acute distress. Eyes: Conjunctivae are normal. PERRL. EOMI. Head: Atraumatic. Nose: No congestion/rhinnorhea. Mouth/Throat: Mucous membranes are moist.  Oropharynx non-erythematous. Poor dentition. Neck: No stridor. No cervical spine tenderness to palpation. Cardiovascular: Normal rate, regular rhythm. Grossly normal heart sounds.  Good peripheral circulation. Respiratory: Normal respiratory effort.  No retractions. Lungs CTAB. Gastrointestinal: Soft and  nontender. No distention. No abdominal bruits. No CVA tenderness. Musculoskeletal: No lower extremity tenderness nor edema.  No joint effusions. Neurologic:  Normal speech and language. No gross focal neurologic deficits are appreciated. Speech is normal. No gait instability. Skin:  Skin is warm, dry and intact. No rash noted. Psychiatric: Mood and affect are normal. Speech and behavior are normal.  ____________________________________________   LABS (all labs ordered are listed, but only abnormal results are displayed)  Labs Reviewed  CBC WITH DIFFERENTIAL/PLATELET - Abnormal; Notable for the following:    Monocytes Absolute 1.0 (*)    All other components within normal limits  URINALYSIS COMPLETEWITH MICROSCOPIC ([facility] ONLY) - Abnormal; Notable for the following:    Color, Urine STRAW (*)    APPearance CLEAR (*)    Leukocytes, UA 1+ (*)    Squamous Epithelial / LPF 0-5 (*)    All other components within normal limits  BASIC METABOLIC PANEL  PREGNANCY, URINE   ____________________________________________  EKG  ED ECG REPORT I, SUNG,JADE J, the attending physician, personally viewed and interpreted this ECG.   Date: 01/02/2015  EKG Time: 2040  Rate: 99  Rhythm: normal EKG, normal sinus rhythm  Axis: Normal  Intervals:none  ST&T Change: Nonspecific  ____________________________________________  RADIOLOGY  None ____________________________________________   PROCEDURES  Procedure(s) performed: None  Critical Care performed: No  ____________________________________________   INITIAL IMPRESSION / ASSESSMENT AND PLAN / ED COURSE  Pertinent labs & imaging results that were available during my care of the patient were reviewed by me and considered in my medical decision making (see chart for details).  29 year old female with complaints of several episodes of "seizures" over the past several weeks since she suffered a minor head injury with concussion. Triage  nurse noted one of these episodes which does not sound like true epileptic seizure activity as patient was conscious with voluntary muscle control during the episode. Question pseudoseizure. Will treat UTI with Septra, limited prescription for benzodiazepine for probable pseudoseizure and patient will follow-up with her PCP early next week. Strict return precautions given. Patient and spouse verbalize understanding and agree with plan of care. ____________________________________________   FINAL CLINICAL IMPRESSION(S) / ED DIAGNOSES  Final diagnoses:  Pseudoseizure  UTI (lower urinary tract infection)      Irean HongJade J Sung, MD 01/03/15 [phone]

## 2015-01-03 MED ORDER — LORAZEPAM 1 MG PO TABS
ORAL_TABLET | ORAL | Status: AC
  Administered 2015-01-03: 1 mg via ORAL
  Filled 2015-01-03: qty 1

## 2015-01-03 MED ORDER — SULFAMETHOXAZOLE-TRIMETHOPRIM 800-160 MG PO TABS
ORAL_TABLET | ORAL | Status: AC
  Administered 2015-01-03: 1 via ORAL
  Filled 2015-01-03: qty 1

## 2015-01-03 MED ORDER — KETOROLAC TROMETHAMINE 30 MG/ML IJ SOLN
INTRAMUSCULAR | Status: AC
  Administered 2015-01-03: 30 mg via INTRAVENOUS
  Filled 2015-01-03: qty 1

## 2015-01-03 MED ORDER — SULFAMETHOXAZOLE-TRIMETHOPRIM 800-160 MG PO TABS: 1.0000 | ORAL_TABLET | Freq: Two times a day (BID) | ORAL | Status: DC

## 2015-01-03 MED ORDER — LORAZEPAM 1 MG PO TABS: 1.0000 mg | ORAL_TABLET | Freq: Three times a day (TID) | ORAL | Status: DC | PRN

## 2015-01-03 NOTE — ED Notes (Signed)
IV removed, catheter intact. Dressing applied. Patient tolerated well. DC instructions and RX's reviewed.

## 2015-01-03 NOTE — Discharge Instructions (Signed)
1. Start antibiotics as prescribed (Septra DS twice daily 5 days). 2. Take anxiolytic as needed (Ativan #9). 3. I do not feel the episodes you are having are epileptic seizures; however, to be on the safe side you should avoid driving until you are seen by your doctor. 4. Return to the ER for worsening symptoms, persistent vomiting, difficulty breathing or other concerns.  Urinary Tract Infection Urinary tract infections (UTIs) can develop anywhere along your urinary tract. Your urinary tract is your body's drainage system for removing wastes and extra water. Your urinary tract includes two kidneys, two ureters, a bladder, and a urethra. Your kidneys are a pair of bean-shaped organs. Each kidney is about the size of your fist. They are located below your ribs, one on each side of your spine. CAUSES Infections are caused by microbes, which are microscopic organisms, including fungi, viruses, and bacteria. These organisms are so small that they can only be seen through a microscope. Bacteria are the microbes that most commonly cause UTIs. SYMPTOMS  Symptoms of UTIs may vary by age and gender of the patient and by the location of the infection. Symptoms in young women typically include a frequent and intense urge to urinate and a painful, burning feeling in the bladder or urethra during urination. Older women and men are more likely to be tired, shaky, and weak and have muscle aches and abdominal pain. A fever may mean the infection is in your kidneys. Other symptoms of a kidney infection include pain in your back or sides below the ribs, nausea, and vomiting. DIAGNOSIS To diagnose a UTI, your caregiver will ask you about your symptoms. Your caregiver also will ask to provide a urine sample. The urine sample will be tested for bacteria and white blood cells. White blood cells are made by your body to help fight infection. TREATMENT  Typically, UTIs can be treated with medication. Because most UTIs are  caused by a bacterial infection, they usually can be treated with the use of antibiotics. The choice of antibiotic and length of treatment depend on your symptoms and the type of bacteria causing your infection. HOME CARE INSTRUCTIONS  If you were prescribed antibiotics, take them exactly as your caregiver instructs you. Finish the medication even if you feel better after you have only taken some of the medication.  Drink enough water and fluids to keep your urine clear or pale yellow.  Avoid caffeine, tea, and carbonated beverages. They tend to irritate your bladder.  Empty your bladder often. Avoid holding urine for long periods of time.  Empty your bladder before and after sexual intercourse.  After a bowel movement, women should cleanse from front to back. Use each tissue only once. SEEK MEDICAL CARE IF:   You have back pain.  You develop a fever.  Your symptoms do not begin to resolve within 3 days. SEEK IMMEDIATE MEDICAL CARE IF:   You have severe back pain or lower abdominal pain.  You develop chills.  You have nausea or vomiting.  You have continued burning or discomfort with urination. MAKE SURE YOU:   Understand these instructions.  Will watch your condition.  Will get help right away if you are not doing well or get worse. Document Released: 03/21/2005 Document Revised: 12/11/2011 Document Reviewed: 07/20/2011 [facility] Patient Information 2015 [place]ExitCare, [place]. This information is not intended to replace advice given to you by your health care provider. Make sure you discuss any questions you have with your health care provider.

## 2015-01-10 DIAGNOSIS — F3181 Bipolar II disorder: Secondary | ICD-10-CM | POA: Insufficient documentation

## 2015-01-10 DIAGNOSIS — E785 Hyperlipidemia, unspecified: Secondary | ICD-10-CM | POA: Insufficient documentation

## 2015-01-10 DIAGNOSIS — G43109 Migraine with aura, not intractable, without status migrainosus: Secondary | ICD-10-CM | POA: Insufficient documentation

## 2015-01-10 DIAGNOSIS — A4902 Methicillin resistant Staphylococcus aureus infection, unspecified site: Secondary | ICD-10-CM | POA: Insufficient documentation

## 2015-01-10 DIAGNOSIS — F319 Bipolar disorder, unspecified: Secondary | ICD-10-CM | POA: Insufficient documentation

## 2015-01-10 DIAGNOSIS — J45909 Unspecified asthma, uncomplicated: Secondary | ICD-10-CM | POA: Insufficient documentation

## 2015-01-10 DIAGNOSIS — T7840XA Allergy, unspecified, initial encounter: Secondary | ICD-10-CM | POA: Insufficient documentation

## 2015-01-12 ENCOUNTER — Encounter: Payer: Self-pay | Admitting: Family Medicine

## 2015-01-12 ENCOUNTER — Ambulatory Visit (INDEPENDENT_AMBULATORY_CARE_PROVIDER_SITE_OTHER): Payer: Managed Care, Other (non HMO) | Admitting: Family Medicine

## 2015-01-12 VITALS — BP 116/74 | HR 94 | Temp 97.8°F | Ht 64.5 in | Wt 152.0 lb

## 2015-01-12 DIAGNOSIS — R569 Unspecified convulsions: Secondary | ICD-10-CM | POA: Diagnosis not present

## 2015-01-12 DIAGNOSIS — G43109 Migraine with aura, not intractable, without status migrainosus: Secondary | ICD-10-CM

## 2015-01-12 DIAGNOSIS — F3160 Bipolar disorder, current episode mixed, unspecified: Secondary | ICD-10-CM

## 2015-01-12 NOTE — Assessment & Plan Note (Addendum)
Will get EEG and refer to neurologist; patient instructed to NOT drive until cleared by neurology; avoid flashing lights

## 2015-01-12 NOTE — Assessment & Plan Note (Signed)
Avoid triggers for migraines; get adequate sleep, avoid dehydration

## 2015-01-12 NOTE — Assessment & Plan Note (Signed)
Psychiatrist will check med level in ten days

## 2015-01-12 NOTE — Progress Notes (Signed)
BP 116/74 mmHg  Pulse 94  Temp(Src) 97.8 F (36.6 C)  Ht 5' 4.5" (1.638 m)  Wt 152 lb (68.947 kg)  BMI 25.70 kg/m2  SpO2 100%  LMP 11/30/2014 (Approximate)   Subjective:    Patient ID: [name], female    DOB: 09/10/1985, 29 y.o.   MRN: [id number]  HPI: [name] is a 29 y.o. female  Chief Complaint  Patient presents with  . Seizures    Has went to the ER several times for Seizures   A few weeks ago, she went to the ER because she hit her head and had a concussion; then started having seizures a week later; she has been to the ER twice for them; the ER is saying they are not epilepsy and they ar stress-related; no new stress She was bending over to pick up a dryer sheet and caught the left side of her head on the dresser; knocked her unconscious; out for many minutes, ambulance got there and she was seizing; no loss of B/B then but has lost control with 1 or 2 of these episodes; this past Sunday, she went to [facility] [place] [facility] [place] and had six in one day; she has not seen a neurologist; flashing lights have been a trigger lately; gets really hot before these occur; son playing video games, for example  She has had a CT scan the day she hit her head and it was negative  She has tubes tied; urine pregnancy negative; urinary tract infection on labs and finished course of Bactrim  Patient says her mother and father thought she was having seizures as a child; used to have headaches a lot too; diagnosed with migraines  She is not aware of any family hx of seizures  Relevant past medical, surgical, family and social history reviewed and updated as indicated. Interim medical history since our last visit reviewed. Allergies and medications reviewed and updated.  Review of Systems  Per HPI unless specifically indicated above     Objective:    BP 116/74 mmHg  Pulse 94  Temp(Src) 97.8 F (36.6 C)  Ht 5' 4.5" (1.638 m)  Wt 152 lb (68.947 kg)  BMI 25.70 kg/m2  SpO2  100%  LMP 11/30/2014 (Approximate)  Wt Readings from Last 3 Encounters:  01/12/15 152 lb (68.947 kg)  11/01/14 153 lb (69.4 kg)  01/02/15 145 lb (65.772 kg)    Physical Exam  Results for orders placed or performed during the hospital encounter of 01/02/15  Basic metabolic panel  Result Value Ref Range   Sodium 138 135 - 145 mmol/L   Potassium 3.7 3.5 - 5.1 mmol/L   Chloride 105 101 - 111 mmol/L   CO2 23 22 - 32 mmol/L   Glucose, Bld 97 65 - 99 mg/dL   BUN 16 6 - 20 mg/dL   Creatinine, Ser 3.08 0.44 - 1.00 mg/dL   Calcium 9.0 8.9 - 65.7 mg/dL   GFR calc non Af Amer >60 >60 mL/min   GFR calc Af Amer >60 >60 mL/min   Anion gap 10 5 - 15  CBC with Differential  Result Value Ref Range   WBC 10.2 3.6 - 11.0 K/uL   RBC 4.38 3.80 - 5.20 MIL/uL   Hemoglobin 13.2 12.0 - 16.0 g/dL   HCT 84.6 96.2 - 95.2 %   MCV 90.7 80.0 - 100.0 fL   MCH 30.2 26.0 - 34.0 pg   MCHC 33.3 32.0 - 36.0 g/dL   RDW  12.9 11.5 - 14.5 %   Platelets 222 150 - 440 K/uL   Neutrophils Relative % 55 %   Neutro Abs 5.6 1.4 - 6.5 K/uL   Lymphocytes Relative 32 %   Lymphs Abs 3.2 1.0 - 3.6 K/uL   Monocytes Relative 10 %   Monocytes Absolute 1.0 (H) 0.2 - 0.9 K/uL   Eosinophils Relative 2 %   Eosinophils Absolute 0.2 0 - 0.7 K/uL   Basophils Relative 1 %   Basophils Absolute 0.1 0 - 0.1 K/uL  Urinalysis complete, with microscopic ([facility] only)  Result Value Ref Range   Color, Urine STRAW (A) YELLOW   APPearance CLEAR (A) CLEAR   Glucose, UA NEGATIVE NEGATIVE mg/dL   Bilirubin Urine NEGATIVE NEGATIVE   Ketones, ur NEGATIVE NEGATIVE mg/dL   Specific Gravity, Urine 1.008 1.005 - 1.030   Hgb urine dipstick NEGATIVE NEGATIVE   pH 6.0 5.0 - 8.0   Protein, ur NEGATIVE NEGATIVE mg/dL   Nitrite NEGATIVE NEGATIVE   Leukocytes, UA 1+ (A) NEGATIVE   RBC / HPF 0-5 0 - 5 RBC/hpf   WBC, UA 0-5 0 - 5 WBC/hpf   Bacteria, UA NONE SEEN NONE SEEN   Squamous Epithelial / LPF 0-5 (A) NONE SEEN  Pregnancy, urine  Result Value  Ref Range   Preg Test, Ur NEGATIVE NEGATIVE      Assessment & Plan:   Problem List Items Addressed This Visit      Cardiovascular and Mediastinum   Migraine headache with aura    Avoid triggers for migraines; get adequate sleep, avoid dehydration        Other   Bipolar disorder    Psychiatrist will check med level in ten days      Seizures - Primary    Will get EEG and refer to neurologist; patient instructed to NOT drive until cleared by neurology; avoid flashing lights      Relevant Orders   Ambulatory referral to Neurology       Follow up plan: No Follow-up on file.

## 2015-01-12 NOTE — Patient Instructions (Addendum)
We'll order an EEG for you and refer you to see a neurologist Do NOT drive until cleared by the neurologist, and also do not take baths alone, get in a swimming pool, get up on ladders, etc, anything that might put you at risk if you had another seizure Get plenty of rest Stop tramadol Consider a headache stick Return in 4 weeks  Seizure, Adult A seizure is abnormal electrical activity in the brain. Seizures usually last from 30 seconds to 2 minutes. There are various types of seizures. Before a seizure, you may have a warning sensation (aura) that a seizure is about to occur. An aura may include the following symptoms:   Fear or anxiety.  Nausea.  Feeling like the room is spinning (vertigo).  Vision changes, such as seeing flashing lights or spots. Common symptoms during a seizure include:  A change in attention or behavior (altered mental status).  Convulsions with rhythmic jerking movements.  Drooling.  Rapid eye movements.  Grunting.  Loss of bladder and bowel control.  Bitter taste in the mouth.  Tongue biting. After a seizure, you may feel confused and sleepy. You may also have an injury resulting from convulsions during the seizure. HOME CARE INSTRUCTIONS   If you are given medicines, take them exactly as prescribed by your health care provider.  Keep all follow-up appointments as directed by your health care provider.  Do not swim or drive or engage in risky activity during which a seizure could cause further injury to you or others until your health care provider says it is OK.  Get adequate rest.  Teach friends and family what to do if you have a seizure. They should:  Lay you on the ground to prevent a fall.  Put a cushion under your head.  Loosen any tight clothing around your neck.  Turn you on your side. If vomiting occurs, this helps keep your airway clear.  Stay with you until you recover.  Know whether or not you need emergency care. SEEK  IMMEDIATE MEDICAL CARE IF:  The seizure lasts longer than 5 minutes.  The seizure is severe or you do not wake up immediately after the seizure.  You have an altered mental status after the seizure.  You are having more frequent or worsening seizures. Someone should drive you to the emergency department or call local emergency services (911 in [place] MAKE SURE YOU:  Understand these instructions.  Will watch your condition.  Will get help right away if you are not doing well or get worse. Document Released: 06/08/2000 Document Revised: 04/01/2013 Document Reviewed: 01/21/2013 [facility]oscopyExit[facility] Patient Information 2015 [place]ExitCare, [place]LLC. This information is not intended to replace advice given to you by your health care provider. Make sure you discuss any questions you have with your health care provider.

## 2015-01-19 ENCOUNTER — Other Ambulatory Visit: Payer: Self-pay

## 2015-01-19 DIAGNOSIS — R569 Unspecified convulsions: Secondary | ICD-10-CM

## 2015-01-25 ENCOUNTER — Encounter: Payer: Self-pay | Admitting: Neurology

## 2015-01-25 ENCOUNTER — Ambulatory Visit (INDEPENDENT_AMBULATORY_CARE_PROVIDER_SITE_OTHER): Payer: Managed Care, Other (non HMO) | Admitting: Neurology

## 2015-01-25 VITALS — BP 106/64 | HR 103 | Resp 16 | Ht 65.75 in | Wt 150.0 lb

## 2015-01-25 DIAGNOSIS — R569 Unspecified convulsions: Secondary | ICD-10-CM | POA: Diagnosis not present

## 2015-01-25 DIAGNOSIS — F319 Bipolar disorder, unspecified: Secondary | ICD-10-CM | POA: Diagnosis not present

## 2015-01-25 NOTE — Patient Instructions (Addendum)
1. Schedule open MRI brain with and without contrast 2. Schedule 1-hour sleep deprived EEG 3. As per [place] driving laws, after a seizure, one should not drive until 6 months seizure-free  4. YOU HAVE BEEN SCHEDULED AT [facility] FOR MRI BRAIN ON 02/07/15 @ 11:45 AM.    [address] [place] [address] [phone]  Seizure Precautions: 1. If medication has been prescribed for you to prevent seizures, take it exactly as directed.  Do not stop taking the medicine without talking to your doctor first, even if you have not had a seizure in a long time.   2. Avoid activities in which a seizure would cause danger to yourself or to others.  Don't operate dangerous machinery, swim alone, or climb in high or dangerous places, such as on ladders, roofs, or girders.  Do not drive unless your doctor says you may.  3. If you have any warning that you may have a seizure, lay down in a safe place where you can't hurt yourself.    4.  No driving for 6 months from last seizure, as per [facility].   Please refer to the following link on the [employer]ile[employer] of [employer]'s website for more information: http://www.epilepsyfoundation.org/answerplace/Social/driving/drivingu.cfm   5.  Maintain good sleep hygiene.  6.  Notify your neurology if you are planning pregnancy or if you become pregnant.  7.  Contact your doctor if you have any problems that may be related to the medicine you are taking.  8.  Call 911 and bring the patient back to the ED if:        A.  The seizure lasts longer than 5 minutes.       B.  The patient doesn't awaken shortly after the seizure  C.  The patient has new problems such as difficulty seeing, speaking or moving  D.  The patient was injured during the seizure  E.  The patient has a temperature over 102 F (39C)  F.  The patient vomited and now is having trouble breathing

## 2015-01-25 NOTE — Progress Notes (Signed)
NEUROLOGY CONSULTATION NOTE  [name] MRN: [id number] DOB: 1986-06-22  Referring provider: Dr. Baruch Gouty Primary care provider: Dr. Baruch Gouty  Reason for consult:  "Seizures"  Dear Dr Sherie Don:  Thank you for your kind referral of [name] for consultation of the above symptoms. Although her history is well known to you, please allow me to reiterate it for the purpose of our medical record. The patient was accompanied to the clinic by her husband who also provides collateral information. Records and images were personally reviewed where available.  HISTORY OF PRESENT ILLNESS: This is a 29 year old right-handed woman with a history of bipolar disorder, asthma, presenting for new onset seizures that started 2 weeks after she hit her head. On 12/13/14, she bent down then hit the left frontal region and recalls having a headache. Her husband noted she was confused but for the most part knew what she was doing, he wanted to bring her to the ER and got her to go into the car. They drove to her mother's house to drop off the children, then when her mother came out to the car to check on her, she was passed out. They got her out of the car and laid her on the ground where she had generalized shaking for 2 minutes. She reported grit her teeth and foamed at the mouth, no incontinence. She has no recollection of this until she woke up in the ER. She was brought to [facility], there is no record of patient/family reporting a seizure, she came in for altered mental status, head CT normal, UDS negative. She was discharged back to baseline. She reports that around 2 weeks alter, on 01/02/15, she started having seizure-like activity. She recalls having worsening of headaches and a metallic taste, then has no recollection of events. Per ER notes, she was resting and closed her eyes then became "shaky." She did this 3 times in the space of 5 minutes. One of the ER nursing notes indicated that she  witnessed one in the waiting room where she had her head down, then started shaking her arms. This stopped when she lifted her arm over her head and she slowly lowered it to her lap. She was then immediately alert and oriented. She reported 5/10 leg muscle aching, reporting that her legs and feet hurt so much you could not touch it ("throbbing and achy"), right>left.  Bloodwork done showed normal CBC and BMP, she was found to have a UTI and started on antibiotic. Since then, she has continued to have recurrent shaking spells that she is mostly amnestic of, except stating that she always has a metallic taste preceding them, and would get hot and dizzy, occasionally vomiting. Her husband reports she looks like she is getting very sleepy, "like she took a heavy medication," he tells her to lie down, then within a minute she starts shaking, usually more on the side she is not lying on. She has had up to 3-4 events a day, sometimes going 2-3 days without any. Last episode was yesterday, prior to that was a few days ago. She reports biting the side of her cheek and bowel incontinence with one of them.  She has a history of migraines since childhood, with throbbing pain in the frontal regions or diffusely. This is associated with sparkly lights and spots, nausea, photo and phonophobia. Her mother and son have migraines. She has intermittent dizziness lasting 10-15 minutes, at times occurring with the headaches. Over the  past 3 years, she has had infrequent episodes where the right foot becomes numb, then travels up her right leg, right arm, face, then goes down her left side. She vomits and feels better, then has a headache afterward. She has had the metallic taste intermittently for several years now. She denies any diplopia, dysarthria, dysphagia, neck pain, bowel/bladder dysfunction. She has chronic back pain. She denies any myoclonic jerks. Since these episodes started, her memory has been "touch and go." She has  always had the best memory, but now could not recall her social security number at one point. She has been taking Depakote since May for bipolar disorder, and reports symptoms are controlled. She denies any alcohol intake.  Epilepsy Risk Factors:  She spent 3 months in the NICU for unrecalled reasons. She has had several head injuries as a child, no prolonged loss of consciousness. She may have had febrile convulsions but is unsure. Otherwise she had a normal birth and early development.  There is no history of CNS infections such as meningitis/encephalitis, neurosurgical procedures, or family history of seizures.  PAST MEDICAL HISTORY: Past Medical History  Diagnosis Date  . Bipolar 1 disorder   . Scoliosis   . Asthma   . GERD (gastroesophageal reflux disease)   . Hyperlipidemia   . Bipolar disorder   . Scoliosis   . Allergy   . Ectopic pregnancy   . MRSA (methicillin resistant Staphylococcus aureus)   . Migraine   . History of concussion 2016  . Pseudoseizures     PAST SURGICAL HISTORY: Past Surgical History  Procedure Laterality Date  . Hernia repair    . Tubal ligation    . Carpal tunnel release  2015    left    MEDICATIONS: Current Outpatient Prescriptions on File Prior to Visit  Medication Sig Dispense Refill  . cetirizine (ZYRTEC) 10 MG tablet Take 10 mg by mouth daily.    . divalproex (DEPAKOTE) 500 MG DR tablet Take 500 mg by mouth daily.     Marland Kitchen FLUoxetine (PROZAC) 20 MG tablet Take 20 mg by mouth daily.    . montelukast (SINGULAIR) 10 MG tablet Take 10 mg by mouth at bedtime.    . Multiple Vitamin (MULTIVITAMIN) tablet Take 1 tablet by mouth daily.    . VENTOLIN HFA 108 (90 BASE) MCG/ACT inhaler INHALE 2 PUFFS PO Q 6 H  11   No current facility-administered medications on file prior to visit.    ALLERGIES: Allergies  Allergen Reactions  . Robitussin (Alcohol Free) [Guaifenesin] Anaphylaxis  . Aspirin Other (See Comments)    Nose bleeds  . Codeine Rash     FAMILY HISTORY: Family History  Problem Relation Age of Onset  . Asthma Mother   . Heart disease Mother   . Diabetes Mother   . Hyperlipidemia Mother   . Hypertension Mother   . Mental illness Mother   . Migraines Mother   . Thyroid disease Mother   . Hypertension Sister   . Cancer Maternal Grandmother   . Diabetes Maternal Grandmother   . Heart disease Maternal Grandmother   . Hyperlipidemia Maternal Grandmother   . Hypertension Maternal Grandmother   . Kidney disease Maternal Grandmother   . Heart disease Paternal Grandfather     SOCIAL HISTORY: History   Social History  . Marital Status: Married    Spouse Name: N/A  . Number of Children: 2  . Years of Education: N/A   Occupational History  . Not on file.  Social History Main Topics  . Smoking status: Current Every Day Smoker -- 1.00 packs/day    Types: Cigarettes  . Smokeless tobacco: Never Used  . Alcohol Use: No  . Drug Use: No  . Sexual Activity: Not on file   Other Topics Concern  . Not on file   Social History Narrative    REVIEW OF SYSTEMS: Constitutional: No fevers, chills, or sweats, no generalized fatigue, change in appetite Eyes: No visual changes, double vision, eye pain Ear, nose and throat: No hearing loss, ear pain, nasal congestion, sore throat Cardiovascular: No chest pain, palpitations Respiratory:  No shortness of breath at rest or with exertion, wheezes GastrointestinaI: No nausea, vomiting, diarrhea, abdominal pain, fecal incontinence Genitourinary:  No dysuria, urinary retention or frequency Musculoskeletal:  No neck pain, +back pain Integumentary: No rash, pruritus, skin lesions Neurological: as above Psychiatric: No depression, insomnia, anxiety Endocrine: No palpitations, fatigue, diaphoresis, mood swings, change in appetite, change in weight, increased thirst Hematologic/Lymphatic:  No anemia, purpura, petechiae. Allergic/Immunologic: no itchy/runny eyes, nasal  congestion, recent allergic reactions, rashes  PHYSICAL EXAM: Filed Vitals:   01/25/15 1445  BP: 106/64  Pulse: 103  Resp: 16   General: No acute distress Head:  Normocephalic/atraumatic Eyes: Fundoscopic exam shows bilateral sharp discs, no vessel changes, exudates, or hemorrhages Neck: supple, no paraspinal tenderness, full range of motion Back: No paraspinal tenderness Heart: regular rate and rhythm Lungs: Clear to auscultation bilaterally. Vascular: No carotid bruits. Skin/Extremities: No rash, no edema Neurological Exam: Mental status: alert and oriented to person, place, and time, no dysarthria or aphasia, Fund of knowledge is appropriate.  Recent and remote memory are intact.  Attention and concentration are normal.    Able to name objects and repeat phrases. Cranial nerves: CN I: not tested CN II: pupils equal, round and reactive to light, visual fields intact, fundi unremarkable. CN III, IV, VI:  full range of motion, no nystagmus, no ptosis. Patient reported feeling dizzy with eye movements CN V: decreased cold and light touch on right V1-2, split midline to pin and vibration to left side and reported having a metallic taste when tuning fork put over forehead CN VII: upper and lower face symmetric CN VIII: hearing intact to finger rub CN IX, X: gag intact, uvula midline CN XI: sternocleidomastoid and trapezius muscles intact CN XII: tongue midline Bulk & Tone: normal, no fasciculations. Motor: 5/5 throughout with no pronator drift. Sensation: decreased cold on right UE and LE, decreased pin on left UE and LE, intact to vibration and joint position sense.  Romberg test negative Deep Tendon Reflexes: brisk +2 throughout, no ankle clonus, negative Hoffman sign Plantar responses: downgoing bilaterally Cerebellar: no incoordination on finger to nose, heel to shin. No dysdiadochokinesia Gait: narrow-based and steady, able to tandem walk adequately. Tremor:  negative  IMPRESSION: This is a 29 year old right-handed woman with a history of bipolar disorder, asthma, presenting with new onset seizures that started after a mild head injury last 12/13/14. One witnessed event in the ER suggestive of psychogenic non-epileptic event. She however has had bowel incontinence with one of them, and reports olfactory hallucinations, which can be seen with partial seizures. Exam shows sensory changes, some that do not follow any clear anatomical distribution. MRI brain with and without contrast and 1-hour sleep-deprived EEG will be ordered to assess for focal abnormalities that increase risk for recurrent seizures. We discussed different types of seizures, including psychogenic non-epileptic events. If EEG normal, a 48-hour EEG will be  done to capture and classify events to help guide long-term management. She is already on Depakote, and reports bipolar symptoms are controlled. [place] driving laws were discussed with the patient, and she knows to stop driving after a seizure, until 6 months seizure-free.   Thank you for allowing me to participate in the care of this patient. Please do not hesitate to call for any questions or concerns.   Patrcia Dolly, M.D.  CC: Dr. Sherie Don

## 2015-01-26 ENCOUNTER — Telehealth: Payer: Self-pay | Admitting: Neurology

## 2015-01-26 DIAGNOSIS — F319 Bipolar disorder, unspecified: Secondary | ICD-10-CM | POA: Insufficient documentation

## 2015-01-26 NOTE — Telephone Encounter (Signed)
[name] from [place]/ [phone] called aboutPt scheduled for the same type of sleep appt/ at [facility] neuro 9:00am/[place] 8:00am/which one should be cancelled?

## 2015-01-26 NOTE — Telephone Encounter (Signed)
Returned call to [place] with scheduling. She wanted clarification for EEG for patient. Patient had also been previously scheduled for an EEG with [facility] by her pcp, when she saw Dr. Karel Jarvis yesterday she wanted patient to have EEG in this office so she could read the test herself. Darel Hong is going to call patient's pcp office to have them cancel the EEG at [facility] and patient will proceed with EEG here in our office tomorrow.

## 2015-01-27 ENCOUNTER — Ambulatory Visit (INDEPENDENT_AMBULATORY_CARE_PROVIDER_SITE_OTHER): Payer: Managed Care, Other (non HMO) | Admitting: Neurology

## 2015-01-27 ENCOUNTER — Ambulatory Visit: Payer: Managed Care, Other (non HMO)

## 2015-01-27 DIAGNOSIS — R569 Unspecified convulsions: Secondary | ICD-10-CM

## 2015-01-28 ENCOUNTER — Telehealth: Payer: Self-pay | Admitting: Family Medicine

## 2015-01-28 DIAGNOSIS — R569 Unspecified convulsions: Secondary | ICD-10-CM

## 2015-01-28 NOTE — Procedures (Signed)
ELECTROENCEPHALOGRAM REPORT  Date of Study: 01/27/2015  Patient's Name: [name] MRN: [id number] Date of Birth: 1986/03/27  Referring Provider: Dr. Patrcia Dolly  Clinical History: This is a 29 year old woman with new onset seizures after a mild head injury. She reports smelling metal before the seizures. EEG for classification.  Medications: Depakote, Prozac  Technical Summary: A multichannel digital 1-hour sleep-deprived EEG recording measured by the international 10-20 system with electrodes applied with paste and impedances below 5000 ohms performed in our laboratory with EKG monitoring in an awake and asleep patient.  Hyperventilation and photic stimulation were performed.  The digital EEG was referentially recorded, reformatted, and digitally filtered in a variety of bipolar and referential montages for optimal display.    Description: The patient is awake and asleep during the recording.  During maximal wakefulness, there is a symmetric, medium voltage 9.5-10 Hz posterior dominant rhythm that attenuates with eye opening.  The record is symmetric.  There is an excess amount of diffuse low voltage beta activity seen throughout the recording. During drowsiness and sleep, there is an increase in theta slowing of the background. Vertex waves and symmetric sleep spindles were seen.  Hyperventilation and photic stimulation did not elicit any abnormalities. Patient became nauseated and vomited during photic stimulation, with no associated epileptiform correlate.  There were no epileptiform discharges or electrographic seizures seen.    EKG lead was unremarkable.  Impression: This 1-hour awake and asleep EEG is normal except for excess amount of diffuse low voltage beta activity.  Clinical Correlation: Diffuse low voltage beta activity is commonly seen with sedating medications such as benzodiazepines.  In the absence of sedating medications, anxiety and hyperthyroidism may produce  generalized beta activity.  The absence of epileptiform discharges does not exclude a clinical diagnosis of epilepsy.  If further clinical questions remain, prolonged EEG may be helpful.  Clinical correlation is advised.   Patrcia Dolly, M.D.

## 2015-01-28 NOTE — Telephone Encounter (Signed)
Patient notified of result. She is agreeable to 48 hour eeg. Order placed in Epic, Darl Pikes will call her to schedule.

## 2015-01-28 NOTE — Telephone Encounter (Signed)
-----   Message from Van Clines, MD sent at 01/28/2015  9:22 AM EDT ----- Please let patient know routine EEG is normal, would proceed with 48-hour EEG as discussed. Thanks

## 2015-02-09 ENCOUNTER — Ambulatory Visit: Payer: Managed Care, Other (non HMO) | Admitting: Family Medicine

## 2015-02-18 ENCOUNTER — Ambulatory Visit: Payer: Managed Care, Other (non HMO) | Admitting: Family Medicine

## 2015-02-21 ENCOUNTER — Ambulatory Visit (INDEPENDENT_AMBULATORY_CARE_PROVIDER_SITE_OTHER): Payer: Managed Care, Other (non HMO) | Admitting: Neurology

## 2015-02-21 DIAGNOSIS — R569 Unspecified convulsions: Secondary | ICD-10-CM

## 2015-03-02 ENCOUNTER — Ambulatory Visit (INDEPENDENT_AMBULATORY_CARE_PROVIDER_SITE_OTHER): Payer: Managed Care, Other (non HMO) | Admitting: Family Medicine

## 2015-03-02 ENCOUNTER — Encounter: Payer: Self-pay | Admitting: Family Medicine

## 2015-03-02 VITALS — BP 105/70 | HR 79 | Temp 97.2°F | Wt 160.0 lb

## 2015-03-02 DIAGNOSIS — W57XXXA Bitten or stung by nonvenomous insect and other nonvenomous arthropods, initial encounter: Secondary | ICD-10-CM

## 2015-03-02 DIAGNOSIS — R1031 Right lower quadrant pain: Secondary | ICD-10-CM | POA: Insufficient documentation

## 2015-03-02 DIAGNOSIS — S80862A Insect bite (nonvenomous), left lower leg, initial encounter: Secondary | ICD-10-CM | POA: Diagnosis not present

## 2015-03-02 LAB — CBC WITH DIFFERENTIAL/PLATELET
HEMATOCRIT: 39.3 % (ref 34.0–46.6)
Hemoglobin: 13.5 g/dL (ref 11.1–15.9)
Lymphocytes Absolute: 2.9 10*3/uL (ref 0.7–3.1)
Lymphs: 31 %
MCH: 31.5 pg (ref 26.6–33.0)
MCHC: 34.4 g/dL (ref 31.5–35.7)
MCV: 92 fL (ref 79–97)
MID (Absolute): 0.6 10*3/uL (ref 0.1–1.6)
MID: 7 %
NEUTROS PCT: 62 %
Neutrophils Absolute: 5.7 10*3/uL (ref 1.4–7.0)
Platelets: 242 10*3/uL (ref 150–379)
RBC: 4.28 x10E6/uL (ref 3.77–5.28)
RDW: 13.4 % (ref 12.3–15.4)
WBC: 9.2 10*3/uL (ref 3.4–10.8)

## 2015-03-02 LAB — MICROSCOPIC EXAMINATION

## 2015-03-02 MED ORDER — NITROFURANTOIN MONOHYD MACRO 100 MG PO CAPS: 100.0000 mg | ORAL_CAPSULE | Freq: Two times a day (BID) | ORAL | Status: DC

## 2015-03-02 NOTE — Assessment & Plan Note (Signed)
WBC normal; urine with 2+ LE; will start antibiotics in case UTI; if pain worsens, to ER; may be adhesions, scar tissue, too, at site of mesh; let me know if worsens

## 2015-03-02 NOTE — Assessment & Plan Note (Signed)
Should resolve on its own; see AVS

## 2015-03-02 NOTE — Patient Instructions (Addendum)
Use ice topically over the sore place on the left for about 15-20 minutes at a time, 3 times a day or so; always keep cloth between ice and skin Okay to use tylenol or ibuprofen if needed per package directions Watch for worsening symptoms and let me know if needed If your abdominal pain worsens, then go to the ER We'll contact you about the urine and blood work [phone]) Hydrate

## 2015-03-02 NOTE — Procedures (Signed)
ELECTROENCEPHALOGRAM REPORT  Dates of Recording: 02/21/2015 to 02/23/2015  Patient's Name: RAILYNN Ross MRN: [id number] Date of Birth: 10/02/1985  Referring Provider: Dr. Patrcia Dolly  Procedure: 48-hour ambulatory EEG  History: This is a 29 year old woman with new onset seizures after a mild head injury. She reports smelling metal before the seizures. EEG for classification.  Medications: Depakote, Prozac  Technical Summary: This is a 48-hour multichannel digital EEG recording measured by the international 10-20 system with electrodes applied with paste and impedances below 5000 ohms performed as portable with EKG monitoring.  The digital EEG was referentially recorded, reformatted, and digitally filtered in a variety of bipolar and referential montages for optimal display.    DESCRIPTION OF RECORDING: During maximal wakefulness, the background activity consisted of a symmetric 11 Hz posterior dominant rhythm which was reactive to eye opening. The record is symmetric. There is an excess amount of diffuse low voltage beta activity seen throughout the recording. There were no epileptiform discharges or electrographic seizures seen.   During the recording, the patient progresses through wakefulness, drowsiness, and Stage 2 sleep.  During drowsiness and sleep, there is an increase in theta and delta slowing, at times sharply contoured without clear epileptogenic potential. Again, there were no epileptiform discharges seen.  Events: On 08/29 at 1805 hours, patient reports feeling frustrated. Electrographically, there were no EEG changes seen. HR 108-114 bpm.  On 08/29 at 1925 hours, she has blurred vision. Electrographically, there were no EEG changes seen. HR 96 bpm.  On 08/30 at 0903 and 0943 hours, she reports feeling frustrated and irritated. Electrographically, there were no EEG changes seen. HR 90-102 bpm.  On 08/30 at 1006 to 1045 hours, she reports blurred vision and headache.  Electrographically, there were no EEG changes seen. HR 120 bpm.  On 08/30 at 1637 hours, she reports feeling very, very hot. Electrographically, there were no EEG changes seen. HR up to 150 bpm.  There were no electrographic seizures seen.  EKG lead showed periods of sinus tachycardia when patient was asymptomatic.  IMPRESSION: This 48-hour ambulatory EEG study is normal except for excess diffuse beta activity.  CLINICAL CORRELATION: Excess beta activity is typically seen with sedating medications such as benzodiazepines. In the absence of sedating medications, anxiety and hyperthyroidism may produce generalized beta activity. A normal EEG does not exclude a clinical diagnosis of epilepsy. Typical episodes were not reported. Symptoms of frustration, blurred vision, and headaches did not show EEG correlate.  If further clinical questions remain, inpatient video EEG monitoring may be helpful.   Patrcia Dolly, M.D.
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
## 2015-03-04 ENCOUNTER — Telehealth: Payer: Self-pay | Admitting: Family Medicine

## 2015-03-04 ENCOUNTER — Ambulatory Visit: Payer: Self-pay | Admitting: Neurology

## 2015-03-04 LAB — UA/M W/RFLX CULTURE, ROUTINE

## 2015-03-04 NOTE — Telephone Encounter (Signed)
Patient was notified of result. She has a f/u appt rescheduled for next Wed.

## 2015-03-04 NOTE — Telephone Encounter (Signed)
-----   Message from Van Clines, MD sent at 03/04/2015  2:35 PM EDT ----- Patient did not come for appointment today, pls let her know the EEG is normal, no evidence of seizures. Would like to discuss what EEG means in the office, I have several openings next week if she can come. Thanks

## 2015-03-08 ENCOUNTER — Ambulatory Visit (INDEPENDENT_AMBULATORY_CARE_PROVIDER_SITE_OTHER): Payer: Managed Care, Other (non HMO) | Admitting: Family Medicine

## 2015-03-08 ENCOUNTER — Encounter: Payer: Self-pay | Admitting: Family Medicine

## 2015-03-08 VITALS — BP 113/74 | HR 109 | Temp 98.1°F | Ht 64.5 in | Wt 166.0 lb

## 2015-03-08 DIAGNOSIS — Z5181 Encounter for therapeutic drug level monitoring: Secondary | ICD-10-CM

## 2015-03-08 DIAGNOSIS — R1031 Right lower quadrant pain: Secondary | ICD-10-CM | POA: Diagnosis not present

## 2015-03-08 DIAGNOSIS — M2392 Unspecified internal derangement of left knee: Secondary | ICD-10-CM | POA: Diagnosis not present

## 2015-03-08 DIAGNOSIS — G43119 Migraine with aura, intractable, without status migrainosus: Secondary | ICD-10-CM

## 2015-03-08 DIAGNOSIS — R5383 Other fatigue: Secondary | ICD-10-CM

## 2015-03-08 DIAGNOSIS — R635 Abnormal weight gain: Secondary | ICD-10-CM | POA: Diagnosis not present

## 2015-03-08 MED ORDER — HYDROCODONE-ACETAMINOPHEN 5-325 MG PO TABS: 1.0000 | ORAL_TABLET | Freq: Four times a day (QID) | ORAL | Status: DC | PRN

## 2015-03-08 MED ORDER — NABUMETONE 750 MG PO TABS: 750.0000 mg | ORAL_TABLET | Freq: Two times a day (BID) | ORAL | Status: DC | PRN

## 2015-03-08 NOTE — Assessment & Plan Note (Addendum)
Unacceptable number of migraines per month; will ask neurologist to help manage; avoid potential triggers; Rx given for nabumetone; see AVS

## 2015-03-08 NOTE — Patient Instructions (Addendum)
We'll refer you to see an orthopaedist for the left knee Use the new medicine for the knee as needed Safeguard your medicines, and never take with alcohol, sleeping pills, or "nerve" pills Do NOT take benadryl at night while taking pain medicine hydrocodone Talk with the neurologist about your migraines Okay to use nabumetone for knee and migraines Avoid triggers You will hear about your lab results If you have not heard anything from my staff in a week about any orders/referrals/studies from today, please contact us here to follow-up [phone]   Migraine Headache A migraine headache is an intense, throbbing pain on one or both sides of your head. A migraine can last for 30 minutes to several hours. CAUSES  The exact cause of a migraine headache is not always known. However, a migraine may be caused when nerves in the brain become irritated and release chemicals that cause inflammation. This causes pain. Certain things may also trigger migraines, such as:  Alcohol.  Smoking.  Stress.  Menstruation.  Aged cheeses.  Foods or drinks that contain nitrates, glutamate, aspartame, or tyramine.  Lack of sleep.  Chocolate.  Caffeine.  Hunger.  Physical exertion.  Fatigue.  Medicines used to treat chest pain (nitroglycerine), birth control pills, estrogen, and some blood pressure medicines. SIGNS AND SYMPTOMS  Pain on one or both sides of your head.  Pulsating or throbbing pain.  Severe pain that prevents daily activities.  Pain that is aggravated by any physical activity.  Nausea, vomiting, or both.  Dizziness.  Pain with exposure to bright lights, loud noises, or activity.  General sensitivity to bright lights, loud noises, or smells. Before you get a migraine, you may get warning signs that a migraine is coming (aura). An aura may include:  Seeing flashing lights.  Seeing bright spots, halos, or zigzag lines.  Having tunnel vision or blurred  vision.  Having feelings of numbness or tingling.  Having trouble talking.  Having muscle weakness. DIAGNOSIS  A migraine headache is often diagnosed based on:  Symptoms.  Physical exam.  A CT scan or MRI of your head. These imaging tests cannot diagnose migraines, but they can help rule out other causes of headaches. TREATMENT Medicines may be given for pain and nausea. Medicines can also be given to help prevent recurrent migraines.  HOME CARE INSTRUCTIONS  Only take over-the-counter or prescription medicines for pain or discomfort as directed by your health care provider. The use of long-term narcotics is not recommended.  Lie down in a dark, quiet room when you have a migraine.  Keep a journal to find out what may trigger your migraine headaches. For example, write down:  What you eat and drink.  How much sleep you get.  Any change to your diet or medicines.  Limit alcohol consumption.  Quit smoking if you smoke.  Get 7-9 hours of sleep, or as recommended by your health care provider.  Limit stress.  Keep lights dim if bright lights bother you and make your migraines worse. SEEK IMMEDIATE MEDICAL CARE IF:   Your migraine becomes severe.  You have a fever.  You have a stiff neck.  You have vision loss.  You have muscular weakness or loss of muscle control.  You start losing your balance or have trouble walking.  You feel faint or pass out.  You have severe symptoms that are different from your first symptoms. MAKE SURE YOU:   Understand these instructions.  Will watch your condition.  Will get help  right away if you are not doing well or get worse. Document Released: 06/11/2005 Document Revised: 10/26/2013 Document Reviewed: 02/16/2013 [facility] Patient Information 2015 [place], [place]. This information is not intended to replace advice given to you by your health care provider. Make sure you discuss any questions you have with your health care  provider.

## 2015-03-08 NOTE — Assessment & Plan Note (Signed)
improved

## 2015-03-08 NOTE — Progress Notes (Signed)
BP 113/74 mmHg  Pulse 109  Temp(Src) 98.1 F (36.7 C)  Ht 5' 4.5" (1.638 m)  Wt 166 lb (75.297 kg)  BMI 28.06 kg/m2  SpO2 97%  LMP 02/04/2015 (Exact Date)   Subjective:    Patient ID: [name], female    DOB: May 10, 1986, 29 y.o.   MRN: [id number]  HPI: [name] is a 29 y.o. female  Chief Complaint  Patient presents with  . Seizures    pt states she is here to follow-up on seizures   She hurt her left knee and wants to talk about that first; she fell through her step outdoors at home; Friday; had immediate pain in the left knee and had trouble walking; went to urgent care; "tore up tendons and ligaments;" gave her toradol 10 mg to take every 4-6 hours; tried elevating and icing; they did xrays; no fractures; gave her a brace and crutches; needs referral to orthopaedist; old injury to the same knee doing tae kwon do 3 or 4 years ago; tore the tendons and ligaments that time too; pain level today 9 out of 10 and that's 45 minutes after taking toradol; moving it makes pain worse  She has seizures and has seen neurologist; going to have appt with her tomorrow, but is going to reschedule it because of the knee; her EEGs were normal;   Migraines with aura; last one was 2 days ago; takes ibuprofen or tylenol  Weight gain significant; going up incrementally; likely family hx of thyroid trouble though she isn't 100% sure  Relevant past medical, surgical, family and social history reviewed and updated as indicated. Interim medical history since our last visit reviewed. Allergies and medications reviewed and updated.  Review of Systems Per HPI unless specifically indicated above     Objective:    BP 113/74 mmHg  Pulse 109  Temp(Src) 98.1 F (36.7 C)  Ht 5' 4.5" (1.638 m)  Wt 166 lb (75.297 kg)  BMI 28.06 kg/m2  SpO2 97%  LMP 02/04/2015 (Exact Date)  Wt Readings from Last 3 Encounters:  03/08/15 166 lb (75.297 kg)  03/02/15 160 lb (72.576 kg)  01/25/15 150 lb  (68.04 kg)    Physical Exam  Constitutional: She appears well-developed and well-nourished.  Appears to be in mild pain just seated, moderate pain during examination of knee and when attempting to ambulate  Cardiovascular: Regular rhythm.   No extrasystoles are present. Tachycardia present.   Pulmonary/Chest: Effort normal and breath sounds normal.  Musculoskeletal:       Left knee: She exhibits decreased range of motion and bony tenderness. She exhibits no swelling, no effusion, no deformity and no erythema. Tenderness found. Medial joint line and lateral joint line tenderness noted.  Neurological: She is alert. She displays no atrophy and no tremor. Gait abnormal.  Using crutches  Skin: No abrasion, no bruising and no ecchymosis noted.  Psychiatric: She has a normal mood and affect. Her speech is normal and behavior is normal. Judgment normal.      Assessment & Plan:   Problem List Items Addressed This Visit      Cardiovascular and Mediastinum   Migraine headache with aura    Unacceptable number of migraines per month; will ask neurologist to help manage; avoid potential triggers; Rx given for nabumetone; see AVS      Relevant Medications   nabumetone (RELAFEN) 750 MG tablet   HYDROcodone-acetaminophen (NORCO/VICODIN) 5-325 MG per tablet     Other   Abdominal pain,  acute, right lower quadrant    improved      Abnormal weight gain    With possible fam hx of hypothyroidism; check TSH, as patient does not believe weight gain consistent with her level of dietary intake and activity level      Relevant Orders   TSH (Completed)   T4, free (Completed)   Encounter for medication monitoring    Check labs      Relevant Orders   Valproic acid level (Completed)    Other Visit Diagnoses    Internal derangement of knee joint, left    -  Primary    refer to orthopaedist; continue to use brace, crutches; nabumetone; no other NSAIDs with this; may use tylenol per package directions     Relevant Orders    Ambulatory referral to Orthopedic Surgery    Other fatigue        Relevant Orders    Vitamin B12 (Completed)    Comprehensive metabolic panel (Completed)    Vit D  25 hydroxy (rtn osteoporosis monitoring) (Completed)       Follow up plan: No Follow-up on file.  An after-visit summary was printed and given to the patient at check-out.  Please see the patient instructions which may contain other information and recommendations beyond what is mentioned above in the assessment and plan.  Orders Placed This Encounter  Procedures  . TSH  . T4, free  . Valproic acid level  . Vitamin B12  . Comprehensive metabolic panel  . Vit D  25 hydroxy (rtn osteoporosis monitoring)  . Specimen status report  . Ambulatory referral to Orthopedic Surgery   Face-to-face time with patient was more than 25 minutes, >50% time spent counseling and coordination of care

## 2015-03-09 ENCOUNTER — Telehealth: Payer: Self-pay | Admitting: Family Medicine

## 2015-03-09 ENCOUNTER — Ambulatory Visit: Payer: Self-pay | Admitting: Neurology

## 2015-03-09 LAB — VITAMIN D 25 HYDROXY (VIT D DEFICIENCY, FRACTURES): Vit D, 25-Hydroxy: 36.3 ng/mL (ref 30.0–100.0)

## 2015-03-09 LAB — T4, FREE: Free T4: 1.03 ng/dL (ref 0.82–1.77)

## 2015-03-09 LAB — TSH: TSH: 1.76 u[IU]/mL (ref 0.450–4.500)

## 2015-03-09 LAB — VITAMIN B12: Vitamin B-12: 362 pg/mL (ref 211–946)

## 2015-03-09 LAB — VALPROIC ACID LEVEL: Valproic Acid Lvl: 25 ug/mL — ABNORMAL LOW (ref 50–100)

## 2015-03-09 NOTE — Telephone Encounter (Signed)
CMP did not come through to Epic from [facility]; please request result and file ticket with help desk Let patient know vit D is normal; thyroid test was normal Her B12 is low; recommend 500 mcg of over-the-counter B12 daily Her seizure medicine level is low, so she'll want to talk to her neurologist about adjusting the dose

## 2015-03-09 NOTE — Telephone Encounter (Signed)
Somehow the CMP was never released, so not done.  It has been added now, should have results tomorrow

## 2015-03-10 ENCOUNTER — Ambulatory Visit: Payer: Self-pay | Admitting: Neurology

## 2015-03-11 LAB — COMPREHENSIVE METABOLIC PANEL
ALK PHOS: 45 IU/L (ref 39–117)
ALT: 23 IU/L (ref 0–32)
AST: 21 IU/L (ref 0–40)
Albumin/Globulin Ratio: 1.9 (ref 1.1–2.5)
Albumin: 3.9 g/dL (ref 3.5–5.5)
BUN/Creatinine Ratio: 18 (ref 8–20)
BUN: 12 mg/dL (ref 6–20)
Bilirubin Total: 0.2 mg/dL (ref 0.0–1.2)
CALCIUM: 9.4 mg/dL (ref 8.7–10.2)
CO2: 21 mmol/L (ref 18–29)
Chloride: 102 mmol/L (ref 97–108)
Creatinine, Ser: 0.65 mg/dL (ref 0.57–1.00)
GFR calc Af Amer: 138 mL/min/{1.73_m2} (ref >59)
GFR, EST NON AFRICAN AMERICAN: 120 mL/min/{1.73_m2} (ref >59)
GLUCOSE: 90 mg/dL (ref 65–99)
Globulin, Total: 2.1 g/dL (ref 1.5–4.5)
Potassium: 4.4 mmol/L (ref 3.5–5.2)
SODIUM: 142 mmol/L (ref 134–144)
Total Protein: 6 g/dL (ref 6.0–8.5)

## 2015-03-11 LAB — SPECIMEN STATUS REPORT

## 2015-03-11 NOTE — Telephone Encounter (Signed)
Let her know that her CMP is completely normal (normal sugar, normal kidneys, normal liver) Let her know the other info below too please Thank you

## 2015-03-12 DIAGNOSIS — Z5181 Encounter for therapeutic drug level monitoring: Secondary | ICD-10-CM | POA: Insufficient documentation

## 2015-03-12 DIAGNOSIS — R635 Abnormal weight gain: Secondary | ICD-10-CM | POA: Insufficient documentation

## 2015-03-12 NOTE — Assessment & Plan Note (Signed)
With possible fam hx of hypothyroidism; check TSH, as patient does not believe weight gain consistent with her level of dietary intake and activity level

## 2015-03-12 NOTE — Assessment & Plan Note (Signed)
Check labs 

## 2015-03-14 ENCOUNTER — Encounter: Payer: Self-pay | Admitting: Family Medicine

## 2015-03-14 NOTE — Telephone Encounter (Signed)
Called and left patient a voicemail asking for her to return my call. 

## 2015-03-14 NOTE — Telephone Encounter (Signed)
Patient returned call and was notified of all results

## 2015-03-14 NOTE — Telephone Encounter (Signed)
Send through MyChart: Your vitamin B12 is low, so please start over-the-counter vitamin B12 500 or 1000 mcg daily. Your valproic acid level is low, so please contact your neurologist about that so see if your dose needs adjusting.

## 2015-03-15 NOTE — Telephone Encounter (Deleted)
Send through MyChart: Your vitamin B12 is low, so please start over-the-counter vitamin B12 500 or 1000 mcg daily. Your valproic acid level is low, so please contact your neurologist about that so see if your dose needs adjusting. 

## 2015-04-05 ENCOUNTER — Encounter: Payer: Self-pay | Admitting: Surgery

## 2015-04-05 ENCOUNTER — Encounter: Payer: Self-pay | Admitting: Family Medicine

## 2015-04-05 ENCOUNTER — Ambulatory Visit (INDEPENDENT_AMBULATORY_CARE_PROVIDER_SITE_OTHER): Payer: Managed Care, Other (non HMO) | Admitting: Family Medicine

## 2015-04-05 VITALS — BP 106/71 | HR 93 | Temp 98.2°F | Ht 64.25 in | Wt 162.0 lb

## 2015-04-05 DIAGNOSIS — Z23 Encounter for immunization: Secondary | ICD-10-CM | POA: Diagnosis not present

## 2015-04-05 DIAGNOSIS — R1031 Right lower quadrant pain: Secondary | ICD-10-CM

## 2015-04-05 DIAGNOSIS — J069 Acute upper respiratory infection, unspecified: Secondary | ICD-10-CM | POA: Diagnosis not present

## 2015-04-05 MED ORDER — VENTOLIN HFA 108 (90 BASE) MCG/ACT IN AERS: 2.0000 | INHALATION_SPRAY | RESPIRATORY_TRACT | Status: DC | PRN

## 2015-04-05 MED ORDER — BENZONATATE 100 MG PO CAPS
100.0000 mg | ORAL_CAPSULE | Freq: Three times a day (TID) | ORAL | Status: DC | PRN
Start: 2015-04-05 — End: 2015-04-28

## 2015-04-05 NOTE — Assessment & Plan Note (Signed)
Persistent now, possible hernia; refer to general surgeon

## 2015-04-05 NOTE — Progress Notes (Signed)
BP 106/71 mmHg  Pulse 93  Temp(Src) 98.2 F (36.8 C)  Ht 5' 4.25" (1.632 m)  Wt 162 lb (73.483 kg)  BMI 27.59 kg/m2  SpO2 100%  LMP 03/10/2015 (Approximate)   Subjective:    Patient ID: [name], female    DOB: 09-03-1985, 30 y.o.   MRN: [id number]  HPI: [name] is a 29 y.o. female  Chief Complaint  Patient presents with  . Follow-up    Patient states she is not sure what we are following up on today  . URI    She has a cold x 1 week.   She has been sick for a week; losing her voice, on and off fever; coughing her head off to the point of throwing up; mild body aches in arms and thighs, little bit in lower back; sinus congestion; bad runny nose; stopped up; she has tried nasal decongestant, nyquil cold, nyquil cough, mucinex, OTC is not controlling the cough; "I'm miserable"; no rash, no travel; left ear is a little stopped up, not in pain, but feels like she needs to pop her ear; mostly sore throat in the back when swallowing; hurt to drink because it burns going down  She is still having lower abdominal pain She has low vitamin B12 Relevant past medical, surgical, family and social history reviewed and updated as indicated. Interim medical history since our last visit reviewed. Allergies and medications reviewed and updated.  Review of Systems  Constitutional: Positive for fever and fatigue.  HENT: Positive for congestion, hearing loss (muffled in the left), sinus pressure, sneezing and sore throat. Negative for ear pain.   Respiratory: Positive for cough.   Gastrointestinal: Positive for nausea, abdominal pain, diarrhea and abdominal distention (sometimes). Negative for blood in stool.   Per HPI unless specifically indicated above     Objective:    BP 106/71 mmHg  Pulse 93  Temp(Src) 98.2 F (36.8 C)  Ht 5' 4.25" (1.632 m)  Wt 162 lb (73.483 kg)  BMI 27.59 kg/m2  SpO2 100%  LMP 03/10/2015 (Approximate)  Wt Readings from Last 3 Encounters:   04/05/15 162 lb (73.483 kg)  03/08/15 166 lb (75.297 kg)  03/02/15 160 lb (72.576 kg)    Physical Exam  Constitutional: She appears well-developed and well-nourished. No distress.  HENT:  Head: Normocephalic and atraumatic.  Right Ear: Hearing, tympanic membrane, external ear and ear canal normal. No middle ear effusion (serous effusion).  Left Ear: Hearing, tympanic membrane, external ear and ear canal normal.  No middle ear effusion (serous effusion, left >> right).  Nose: Mucosal edema and rhinorrhea present.  Mouth/Throat: Uvula is midline and mucous membranes are normal. No oral lesions. Posterior oropharyngeal erythema (mildly injected) present. No oropharyngeal exudate, posterior oropharyngeal edema or tonsillar abscesses.  Abdominal: There is tenderness in the right lower quadrant.  Lymphadenopathy:       Head (right side): No submandibular, no preauricular and no posterior auricular adenopathy present.       Head (left side): No submandibular, no preauricular and no posterior auricular adenopathy present.    She has no cervical adenopathy.  Psychiatric: She has a normal mood and affect. Judgment and thought content normal.   Results for orders placed or performed in visit on 03/08/15  TSH  Result Value Ref Range   TSH 1.760 0.450 - 4.500 uIU/mL  T4, free  Result Value Ref Range   Free T4 1.03 0.82 - 1.77 ng/dL  Valproic acid level  Result  Value Ref Range   Valproic Acid Lvl 25 (L) 50 - 100 ug/mL  Vitamin B12  Result Value Ref Range   Vitamin B-12 362 211 - 946 pg/mL  Comprehensive metabolic panel  Result Value Ref Range   Glucose 90 65 - 99 mg/dL   BUN 12 6 - 20 mg/dL   Creatinine, Ser 4.09 0.57 - 1.00 mg/dL   GFR calc non Af Amer 120 >59 mL/min/1.73   GFR calc Af Amer 138 >59 mL/min/1.73   BUN/Creatinine Ratio 18 8 - 20   Sodium 142 134 - 144 mmol/L   Potassium 4.4 3.5 - 5.2 mmol/L   Chloride 102 97 - 108 mmol/L   CO2 21 18 - 29 mmol/L   Calcium 9.4 8.7 - 10.2  mg/dL   Total Protein 6.0 6.0 - 8.5 g/dL   Albumin 3.9 3.5 - 5.5 g/dL   Globulin, Total 2.1 1.5 - 4.5 g/dL   Albumin/Globulin Ratio 1.9 1.1 - 2.5   Bilirubin Total 0.2 0.0 - 1.2 mg/dL   Alkaline Phosphatase 45 39 - 117 IU/L   AST 21 0 - 40 IU/L   ALT 23 0 - 32 IU/L  Vit D  25 hydroxy (rtn osteoporosis monitoring)  Result Value Ref Range   Vit D, 25-Hydroxy 36.3 30.0 - 100.0 ng/mL  Specimen status report  Result Value Ref Range   specimen status report Comment       Assessment & Plan:   Problem List Items Addressed This Visit      Respiratory   Upper respiratory infection, viral - Primary    Rest, hydration, vitamin C, green tea; no need at this point for antibiotics; contagious; reasons to call reviewed; supportive, symptomatic care        Other   Abdominal pain, acute, right lower quadrant    Persistent now, possible hernia; refer to general surgeon      Relevant Orders   Ambulatory referral to General Surgery   Need for diphtheria-tetanus-pertussis (Tdap) vaccine, adult/adolescent   Relevant Orders   Tdap vaccine greater than or equal to 7yo IM (Completed)       Follow up plan: No Follow-up on file.  An after-visit summary was printed and given to the patient at check-out.  Please see the patient instructions which may contain other information and recommendations beyond what is mentioned above in the assessment and plan.

## 2015-04-05 NOTE — Patient Instructions (Addendum)
Try vitamin C (orange juice if not diabetic or vitamin C tablets) and drink green tea to help your immune system during your illness Get plenty of rest and hydration Start the new cough medicine and use as directed if needed If symptoms worsen, then please do call or go to urgent care over the weekend Use your inhaler if needed Okay to take 500 mcg or 1000 mcg of vitamin B12 daily We'll refer you to a general surgeon for the pain in the right lower abdomen/groin You received the vaccine to protect against tetanus and diphtheria and pertussis today; the tetanus and diphtheria portions will provide protection up to ten years, and the pertussis component will give you protection against whooping cough for life

## 2015-04-07 NOTE — Assessment & Plan Note (Signed)
Rest, hydration, vitamin C, green tea; no need at this point for antibiotics; contagious; reasons to call reviewed; supportive, symptomatic care

## 2015-04-20 ENCOUNTER — Ambulatory Visit: Payer: Managed Care, Other (non HMO) | Admitting: Surgery

## 2015-04-28 ENCOUNTER — Ambulatory Visit (INDEPENDENT_AMBULATORY_CARE_PROVIDER_SITE_OTHER): Payer: Managed Care, Other (non HMO) | Admitting: General Surgery

## 2015-04-28 ENCOUNTER — Encounter: Payer: Self-pay | Admitting: General Surgery

## 2015-04-28 VITALS — BP 110/73 | HR 103 | Temp 97.4°F | Ht 66.0 in

## 2015-04-28 DIAGNOSIS — R1031 Right lower quadrant pain: Secondary | ICD-10-CM

## 2015-04-28 NOTE — Addendum Note (Signed)
Addended by: Adela PortsBONICHE, Lemmie Steinhaus on: 04/28/2015 12:28 PM   Modules accepted: Orders

## 2015-04-28 NOTE — Progress Notes (Signed)
Patient ID: [name], female   DOB: 04-20-1986, 29 y.o.   MRN: [id number]  CC: ABDOMINAL PAIN  HPI [name] is a 29 y.o. female presents to clinic for evaluation of right groin pain. Patient states that the pain has been present for approximately 4 months. It is worse with touching, movement, coughing, bending, lifting. She states that the pain is improved with lying flat on her back. She has not noticed any bulge during this time period. She did have an upper respiratory infection during this time course which made the pain worse. She denies any other complaints at this time. She denies any fevers, chills, nausea, vomiting, chest pain, shortness of breath, cough, diarrhea, constipation.  HPI  Past Medical History  Diagnosis Date  . Bipolar 1 disorder (HCC)   . Scoliosis   . Asthma   . GERD (gastroesophageal reflux disease)   . Hyperlipidemia   . Bipolar disorder (HCC)   . Scoliosis   . Allergy   . Ectopic pregnancy   . MRSA (methicillin resistant Staphylococcus aureus)   . Migraine   . History of concussion 2016  . Pseudoseizures     Past Surgical History  Procedure Laterality Date  . Hernia repair  2010  . Tubal ligation    . Carpal tunnel release  2015    left    Family History  Problem Relation Age of Onset  . Asthma Mother   . Heart disease Mother   . Diabetes Mother   . Hyperlipidemia Mother   . Hypertension Mother   . Mental illness Mother   . Migraines Mother   . Thyroid disease Mother   . Stroke Mother   . Hypertension Sister   . Cancer Maternal Grandmother   . Diabetes Maternal Grandmother   . Heart disease Maternal Grandmother   . Hyperlipidemia Maternal Grandmother   . Hypertension Maternal Grandmother   . Kidney disease Maternal Grandmother   . Heart disease Paternal Grandfather     Social History Social History  Substance Use Topics  . Smoking status: Current Every Day Smoker -- 1.00 packs/day    Types: Cigarettes  . Smokeless  tobacco: Never Used  . Alcohol Use: No    Allergies  Allergen Reactions  . Robitussin (Alcohol Free) [Guaifenesin] Anaphylaxis  . Aspirin Other (See Comments)    Nose bleeds  . Codeine Rash    Current Outpatient Prescriptions  Medication Sig Dispense Refill  . cetirizine (ZYRTEC) 10 MG tablet Take 10 mg by mouth daily.    . diphenhydrAMINE (BENADRYL) 25 MG tablet Take 25 mg by mouth at bedtime.    . divalproex (DEPAKOTE) 500 MG DR tablet Take 500 mg by mouth daily.     Marland Kitchen FLUoxetine (PROZAC) 20 MG tablet Take 20 mg by mouth daily.    . montelukast (SINGULAIR) 10 MG tablet Take 10 mg by mouth at bedtime.    . Multiple Vitamin (MULTIVITAMIN) tablet Take 1 tablet by mouth daily.    . nabumetone (RELAFEN) 750 MG tablet Take 1 tablet (750 mg total) by mouth 2 (two) times daily as needed. Take with food; do NOT take with other NSAIDs 40 tablet 0  . VENTOLIN HFA 108 (90 BASE) MCG/ACT inhaler Inhale 2 puffs into the lungs every 4 (four) hours as needed for wheezing or shortness of breath. 1 Inhaler 1  . vitamin B-12 (CYANOCOBALAMIN) 1000 MCG tablet Take 1,000 mcg by mouth daily.    . vitamin C (ASCORBIC ACID) 500 MG tablet  Take 500 mg by mouth daily.     No current facility-administered medications for this visit.     Review of Systems A multipoint review of systems was completed. All pertinent positives and negatives in history of present illness remainder were negative.  Physical Exam Blood pressure 110/73, pulse 103, temperature 97.4 F (36.3 C), temperature source Oral, height 5\' 6"  (1.676 m), last menstrual period 03/10/2015. CONSTITUTIONAL: Normal affect and in no acute distress.Marland Kitchen. EYES: Pupils are equal, round, and reactive to light, Sclera are non-icteric. EARS, NOSE, MOUTH AND THROAT: The oropharynx is clear. The oral mucosa shows evidence of infections that every tooth. Multiple missing teeth and poor dentition. Hearing is intact to voice. LYMPH NODES:  Lymph nodes in the neck  are normal. RESPIRATORY:  Lungs are clear. There is normal respiratory effort, with equal breath sounds bilaterally, and without pathologic use of accessory muscles. CARDIOVASCULAR: Heart is regular without murmurs, gallops, or rubs. GI: The abdomen is soft, exquisitely tender in the right groin specifically the right side of the pubic tubercle that extends from the pubic tubercle all the way to the ASIS, no appreciable bulge, and nondistended. There are no palpable masses. There is no hepatosplenomegaly. There are normal bowel sounds in all quadrants. GU: Rectal deferred.   MUSCULOSKELETAL: Normal muscle strength and tone. No cyanosis or edema.   SKIN: Turgor is good and there are no pathologic skin lesions or ulcers. NEUROLOGIC: Motor and sensation is grossly normal. Cranial nerves are grossly intact. PSYCH:  Oriented to person, place and time. Affect is normal.  Data Reviewed No images to review no pertinent labs I have personally reviewed the patient's imaging, laboratory findings and medical records.    Assessment    29 year old female with right groin pain    Plan    29 year old female with right groin pain. Although pain is exquisite and at the right location for hernia recurrence there is no appreciable bulge on exam with or without Valsalva. Discussed with the patient that her pain distribution also was in the same location as the ilioinguinal nerve. Due to inability to palpate any recurrence of her hernia discussed the next step would be to obtain an ultrasound to evaluate for hernia recurrence. If there is no evidence of hernia recurrence within be a candidate for inguinal nerve ablation. She will follow-up in clinic 1 week after ultrasound.     Time spent with the patient was 30 minutes, with more than 50% of the time spent in face-to-face education, counseling and care coordination.     Ricarda FrameCharles Conchetta Lamia 04/28/2015, 10:53 AM

## 2015-04-28 NOTE — Patient Instructions (Signed)
Central scheduling will be contacting at your cell phone per your request to schedule your ultrasound. Please call at 4:00 PM if you have not heard from them.

## 2015-04-29 ENCOUNTER — Ambulatory Visit: Payer: Managed Care, Other (non HMO) | Admitting: General Surgery

## 2015-05-03 ENCOUNTER — Ambulatory Visit
Admission: RE | Admit: 2015-05-03 | Discharge: 2015-05-03 | Disposition: A | Discharge status: Home / Self Care | Payer: Managed Care, Other (non HMO) | Source: Ambulatory Visit | Attending: General Surgery | Admitting: General Surgery

## 2015-05-03 ENCOUNTER — Telehealth: Payer: Self-pay

## 2015-05-03 DIAGNOSIS — R1031 Right lower quadrant pain: Secondary | ICD-10-CM | POA: Insufficient documentation

## 2015-05-03 NOTE — Telephone Encounter (Signed)
Spoke with patient at this time. Appointment moved to 05/11/15. Patient also states that she does not have co-pay until Friday. I asked if she could call back in to pay on Friday 05/13/15 and she agreed that she would do this.

## 2015-05-03 NOTE — Telephone Encounter (Addendum)
Patient was called at this time to move appointment to next week in order to have Ultrasound results for appointment. Patient originally seen by Dr. Tonita CongWoodham on 04/28/15. Appointment for 05/04/15 cancelled at this time.

## 2015-05-04 ENCOUNTER — Ambulatory Visit: Payer: Self-pay | Admitting: Surgery

## 2015-05-11 ENCOUNTER — Ambulatory Visit (INDEPENDENT_AMBULATORY_CARE_PROVIDER_SITE_OTHER): Payer: Managed Care, Other (non HMO) | Admitting: General Surgery

## 2015-05-11 ENCOUNTER — Encounter: Payer: Self-pay | Admitting: General Surgery

## 2015-05-11 VITALS — BP 129/70 | HR 94 | Temp 98.3°F | Ht 66.0 in | Wt 168.0 lb

## 2015-05-11 DIAGNOSIS — R1031 Right lower quadrant pain: Secondary | ICD-10-CM | POA: Diagnosis not present

## 2015-05-11 MED ORDER — CELECOXIB 100 MG PO CAPS: 100.0000 mg | ORAL_CAPSULE | Freq: Two times a day (BID) | ORAL | Status: DC

## 2015-05-11 MED ORDER — TRAMADOL HCL 50 MG PO TABS: 50.0000 mg | ORAL_TABLET | Freq: Four times a day (QID) | ORAL | Status: DC | PRN

## 2015-05-11 NOTE — Patient Instructions (Signed)
You will need a referral to the pain clinic for a nerve block. We will get this arranged and give you a call as soon as we have an appointment confirmed.   You have been given a prescription for Ultram today, you may take this to any pharmacy to have this filled.

## 2015-05-11 NOTE — Progress Notes (Signed)
Outpatient Surgical Follow Up  05/11/2015  [name] is an 29 y.o. female.   Chief Complaint  Patient presents with  . Groin Pain  . Results    Ultrasound    HPI: 29 year old female returns to clinic for continued evaluation of right groin pain. The pain is unchanged from her previous visit. Unrelated of this she is also currently fighting a viral infection with nausea and fevers. She is also recently started imaging cycle. She denies any current diarrhea, constipation, chest pain, short of breath, emesis. She states the pain is worse with certain palpations or movements however does not bother her an absence of these. She underwent an ultrasound of the area since her last visit.  Past Medical History  Diagnosis Date  . Bipolar 1 disorder (HCC)   . Scoliosis   . Asthma   . GERD (gastroesophageal reflux disease)   . Hyperlipidemia   . Bipolar disorder (HCC)   . Scoliosis   . Allergy   . Ectopic pregnancy   . MRSA (methicillin resistant Staphylococcus aureus)   . Migraine   . History of concussion 2016  . Pseudoseizures     Past Surgical History  Procedure Laterality Date  . Hernia repair  2010  . Tubal ligation    . Carpal tunnel release  2015    left    Family History  Problem Relation Age of Onset  . Asthma Mother   . Heart disease Mother   . Diabetes Mother   . Hyperlipidemia Mother   . Hypertension Mother   . Mental illness Mother   . Migraines Mother   . Thyroid disease Mother   . Stroke Mother   . Hypertension Sister   . Cancer Maternal Grandmother   . Diabetes Maternal Grandmother   . Heart disease Maternal Grandmother   . Hyperlipidemia Maternal Grandmother   . Hypertension Maternal Grandmother   . Kidney disease Maternal Grandmother   . Heart disease Paternal Grandfather     Social History:  reports that she has been smoking Cigarettes.  She has been smoking about 1.00 pack per day. She has never used smokeless tobacco. She reports that she  does not drink alcohol or use illicit drugs.  Allergies:  Allergies  Allergen Reactions  . Robitussin (Alcohol Free) [Guaifenesin] Anaphylaxis  . Aspirin Other (See Comments)    Nose bleeds  . Codeine Rash    Medications reviewed.    ROS  A multipoint review of systems was completed. All pertinent positives and negatives were within the history of present illness remainder were negative.  BP 129/70 mmHg  Pulse 94  Temp(Src) 98.3 F (36.8 C)  Ht 5\' 6"  (1.676 m)  Wt 76.204 kg (168 lb)  BMI 27.13 kg/m2  LMP 05/11/2015 (Exact Date)  Physical Exam  Gen.: No acute distress neck sign chest: Clear to all sedation Heart: Regular rhythm Abdomen: Soft, nondistended. Well-healed right groin incision from an open anal hernia repair. Tenderness starts over the iliac crest and follows the tract of the ilioinguinal nerve over the incision site towards her pubic tubercle. No palpable bulge or evidence of herniation on exam.   No results found for this or any previous visit (from the past 48 hour(s)). No results found. Ultrasound reviewed which shows no evidence of hernia recurrence. Assessment/Plan:  Right groin pain: Ultrasound reassuring and confirms physical examination of no hernia recurrence. Due to the tracking of pain along the line of the ilioinguinal nerve suggested the patient could benefit from  a nerve ablation or block. We will place a referral to the pain management clinic for this procedure. In the meantime we will provide with a small round of Ultram for as needed pain relief. Discussed with the patient that should the nerve blocks be ineffective after multiple attempts she might ineffective from a nerve ligation in the future. She'll follow up on an as-needed basis.    Ricarda Frame  05/11/2015,negative

## 2015-05-29 ENCOUNTER — Encounter: Payer: Self-pay | Admitting: Emergency Medicine

## 2015-05-29 ENCOUNTER — Emergency Department
Admission: EM | Admit: 2015-05-29 | Discharge: 2015-05-29 | Disposition: A | Discharge status: Home / Self Care | Payer: Managed Care, Other (non HMO) | Attending: Emergency Medicine | Admitting: Emergency Medicine

## 2015-05-29 ENCOUNTER — Emergency Department: Payer: Managed Care, Other (non HMO)

## 2015-05-29 DIAGNOSIS — Y998 Other external cause status: Secondary | ICD-10-CM | POA: Diagnosis not present

## 2015-05-29 DIAGNOSIS — F17210 Nicotine dependence, cigarettes, uncomplicated: Secondary | ICD-10-CM | POA: Insufficient documentation

## 2015-05-29 DIAGNOSIS — Z791 Long term (current) use of non-steroidal anti-inflammatories (NSAID): Secondary | ICD-10-CM | POA: Diagnosis not present

## 2015-05-29 DIAGNOSIS — S79912A Unspecified injury of left hip, initial encounter: Secondary | ICD-10-CM | POA: Diagnosis not present

## 2015-05-29 DIAGNOSIS — Y9389 Activity, other specified: Secondary | ICD-10-CM | POA: Diagnosis not present

## 2015-05-29 DIAGNOSIS — Z3202 Encounter for pregnancy test, result negative: Secondary | ICD-10-CM | POA: Diagnosis not present

## 2015-05-29 DIAGNOSIS — X58XXXA Exposure to other specified factors, initial encounter: Secondary | ICD-10-CM | POA: Insufficient documentation

## 2015-05-29 DIAGNOSIS — Z79899 Other long term (current) drug therapy: Secondary | ICD-10-CM | POA: Diagnosis not present

## 2015-05-29 DIAGNOSIS — M25552 Pain in left hip: Secondary | ICD-10-CM

## 2015-05-29 DIAGNOSIS — Y9289 Other specified places as the place of occurrence of the external cause: Secondary | ICD-10-CM | POA: Diagnosis not present

## 2015-05-29 LAB — POCT PREGNANCY, URINE: PREG TEST UR: NEGATIVE

## 2015-05-29 MED ORDER — CYCLOBENZAPRINE HCL 10 MG PO TABS
10.0000 mg | ORAL_TABLET | Freq: Once | ORAL | Status: AC
  Administered 2015-05-29: 10 mg via ORAL
  Filled 2015-05-29: qty 1

## 2015-05-29 MED ORDER — CYCLOBENZAPRINE HCL 10 MG PO TABS: 10.0000 mg | ORAL_TABLET | Freq: Three times a day (TID) | ORAL | Status: DC | PRN

## 2015-05-29 MED ORDER — KETOROLAC TROMETHAMINE 60 MG/2ML IM SOLN
60.0000 mg | Freq: Once | INTRAMUSCULAR | Status: AC
  Administered 2015-05-29: 60 mg via INTRAMUSCULAR
  Filled 2015-05-29: qty 2

## 2015-05-29 MED ORDER — KETOROLAC TROMETHAMINE 10 MG PO TABS: 10.0000 mg | ORAL_TABLET | Freq: Four times a day (QID) | ORAL | Status: DC | PRN

## 2015-05-29 NOTE — ED Provider Notes (Signed)
[facility] Emergency Department Provider Note  ____________________________________________  Time seen: Approximately 12:52 PM  I have reviewed the triage vital signs and the nursing notes.   HISTORY  Chief Complaint Hip Pain   HPI [name] is a 29 y.o. female who presents to the emergency department for evaluation of left hip pain. No specific trauma. She states that she just got up out of bed and heard a pop and has been painful ever since. She denies a history of fracture or injury to that hip. She has been taking ibuprofen without relief of pain.   Past Medical History  Diagnosis Date  . Bipolar 1 disorder (HCC)   . Scoliosis   . Asthma   . GERD (gastroesophageal reflux disease)   . Hyperlipidemia   . Bipolar disorder (HCC)   . Scoliosis   . Allergy   . Ectopic pregnancy   . MRSA (methicillin resistant Staphylococcus aureus)   . Migraine   . History of concussion 2016  . Pseudoseizures     Patient Active Problem List   Diagnosis Date Noted  . Need for diphtheria-tetanus-pertussis (Tdap) vaccine, adult/adolescent 04/05/2015  . Abnormal weight gain 03/12/2015  . Encounter for medication monitoring 03/12/2015  . Abdominal pain, acute, right lower quadrant 03/02/2015  . Bipolar affective disorder (HCC) 01/26/2015  . Convulsion (HCC) 01/25/2015  . Seizures (HCC) 01/12/2015  . Asthma   . Hyperlipidemia   . Bipolar disorder (HCC)   . Allergy   . MRSA (methicillin resistant Staphylococcus aureus)   . Migraine headache with aura     Past Surgical History  Procedure Laterality Date  . Hernia repair  2010  . Tubal ligation    . Carpal tunnel release  2015    left    Current Outpatient Rx  Name  Route  Sig  Dispense  Refill  . celecoxib (CELEBREX) 100 MG capsule   Oral   Take 1 capsule (100 mg total) by mouth 2 (two) times daily.   30 capsule   0   . cetirizine (ZYRTEC) 10 MG tablet   Oral   Take 10 mg by mouth daily.          . diphenhydrAMINE (BENADRYL) 25 MG tablet   Oral   Take 25 mg by mouth at bedtime.         . divalproex (DEPAKOTE) 500 MG DR tablet   Oral   Take 500 mg by mouth daily.          Marland Kitchen. FLUoxetine (PROZAC) 20 MG tablet   Oral   Take 20 mg by mouth daily.         . montelukast (SINGULAIR) 10 MG tablet   Oral   Take 10 mg by mouth at bedtime.         . Multiple Vitamin (MULTIVITAMIN) tablet   Oral   Take 1 tablet by mouth daily.         . nabumetone (RELAFEN) 750 MG tablet   Oral   Take 1 tablet (750 mg total) by mouth 2 (two) times daily as needed. Take with food; do NOT take with other NSAIDs   40 tablet   0   . VENTOLIN HFA 108 (90 BASE) MCG/ACT inhaler   Inhalation   Inhale 2 puffs into the lungs every 4 (four) hours as needed for wheezing or shortness of breath.   1 Inhaler   1     Dispense as written.   . vitamin B-12 (CYANOCOBALAMIN)  1000 MCG tablet   Oral   Take 1,000 mcg by mouth daily.         . vitamin C (ASCORBIC ACID) 500 MG tablet   Oral   Take 500 mg by mouth daily.           Allergies Robitussin (alcohol free); Aspirin; Tramadol; and Codeine  Family History  Problem Relation Age of Onset  . Asthma Mother   . Heart disease Mother   . Diabetes Mother   . Hyperlipidemia Mother   . Hypertension Mother   . Mental illness Mother   . Migraines Mother   . Thyroid disease Mother   . Stroke Mother   . Hypertension Sister   . Cancer Maternal Grandmother   . Diabetes Maternal Grandmother   . Heart disease Maternal Grandmother   . Hyperlipidemia Maternal Grandmother   . Hypertension Maternal Grandmother   . Kidney disease Maternal Grandmother   . Heart disease Paternal Grandfather     Social History Social History  Substance Use Topics  . Smoking status: Current Every Day Smoker -- 1.00 packs/day    Types: Cigarettes  . Smokeless tobacco: Never Used  . Alcohol Use: No    Review of Systems Constitutional: No recent  illness. Eyes: No visual changes. ENT: No sore throat. Cardiovascular: Denies chest pain or palpitations. Respiratory: Denies shortness of breath. Gastrointestinal: No abdominal pain.  Genitourinary: Negative for dysuria. Musculoskeletal: Pain in left hip Skin: Negative for rash. Neurological: Negative for headaches, focal weakness or numbness. 10-point ROS otherwise negative.  ____________________________________________   PHYSICAL EXAM:  VITAL SIGNS: ED Triage Vitals  Enc Vitals Group     BP 05/29/15 1211 113/75 mmHg     Pulse Rate 05/29/15 1211 108     Resp 05/29/15 1211 16     Temp 05/29/15 1211 98 F (36.7 C)     Temp Source 05/29/15 1211 Oral     SpO2 05/29/15 1211 98 %     Weight 05/29/15 1211 168 lb (76.204 kg)     Height 05/29/15 1211  (1.651 m)     Head Cir --      Peak Flow --      Pain Score 05/29/15 1231 8     Pain Loc --      Pain Edu? --      Excl. in GC? --     Constitutional: Alert and oriented. Well appearing and in no acute distress. Eyes: Conjunctivae are normal. EOMI. Head: Atraumatic. Nose: No congestion/rhinnorhea. Neck: No stridor.  Respiratory: Normal respiratory effort.   Musculoskeletal: Left hip tender to palpation. Patient complains of pain with abduction and adduction.  Neurologic:  Normal speech and language. No gross focal neurologic deficits are appreciated. Speech is normal. No gait instability. Skin:  Skin is warm, dry and intact. Atraumatic. Psychiatric: Mood and affect are normal. Speech and behavior are normal.  ____________________________________________   LABS (all labs ordered are listed, but only abnormal results are displayed)  Labs Reviewed - No data to display ____________________________________________  RADIOLOGY  Left hip is negative for acute bony abnormality per radiology. ____________________________________________   PROCEDURES  Procedure(s) performed:  None   ____________________________________________   INITIAL IMPRESSION / ASSESSMENT AND PLAN / ED COURSE  Pertinent labs & imaging results that were available during my care of the patient were reviewed by me and considered in my medical decision making (see chart for details).  Patient was advised to follow up with orthopedics for symptoms that are not  relieved by flexeril and NSAID. She was advised to return to the emergency department for symptoms that change or worsen if unable to schedule an appointment. ____________________________________________   FINAL CLINICAL IMPRESSION(S) / ED DIAGNOSES  Final diagnoses:  Acute hip pain, left       Chinita Pester, FNP 05/29/15 1559  Sharyn Creamer, MD 05/29/15 [phone]

## 2015-05-29 NOTE — Discharge Instructions (Signed)

## 2015-05-29 NOTE — ED Notes (Signed)
Pt presents to Er with mom at bedside with c/o left hip pain that started Friday. Pt states she got up and heard it pop that progressively worsened Saturday night. " It pops when I move with a sharp jolt when I move then it eases off".Pt has taken ibuprofen around 0100 without much relief.

## 2015-05-29 NOTE — ED Notes (Signed)
Left hip pain. + pulses. No obvious injury.

## 2015-07-21 ENCOUNTER — Encounter: Payer: Self-pay | Admitting: *Deleted

## 2015-07-21 ENCOUNTER — Emergency Department
Admission: EM | Admit: 2015-07-21 | Discharge: 2015-07-21 | Disposition: A | Discharge status: Home / Self Care | Payer: Managed Care, Other (non HMO) | Attending: Student | Admitting: Student

## 2015-07-21 DIAGNOSIS — Z79899 Other long term (current) drug therapy: Secondary | ICD-10-CM | POA: Diagnosis not present

## 2015-07-21 DIAGNOSIS — J340 Abscess, furuncle and carbuncle of nose: Secondary | ICD-10-CM

## 2015-07-21 DIAGNOSIS — F17210 Nicotine dependence, cigarettes, uncomplicated: Secondary | ICD-10-CM | POA: Insufficient documentation

## 2015-07-21 DIAGNOSIS — L03211 Cellulitis of face: Secondary | ICD-10-CM | POA: Diagnosis not present

## 2015-07-21 DIAGNOSIS — Z791 Long term (current) use of non-steroidal anti-inflammatories (NSAID): Secondary | ICD-10-CM | POA: Diagnosis not present

## 2015-07-21 DIAGNOSIS — L089 Local infection of the skin and subcutaneous tissue, unspecified: Secondary | ICD-10-CM | POA: Diagnosis present

## 2015-07-21 MED ORDER — SULFAMETHOXAZOLE-TRIMETHOPRIM 800-160 MG PO TABS
1.0000 | ORAL_TABLET | Freq: Once | ORAL | Status: AC
  Administered 2015-07-21: 1 via ORAL
  Filled 2015-07-21: qty 1

## 2015-07-21 MED ORDER — OXYCODONE-ACETAMINOPHEN 5-325 MG PO TABS: 1.0000 | ORAL_TABLET | ORAL | Status: DC | PRN

## 2015-07-21 MED ORDER — SULFAMETHOXAZOLE-TRIMETHOPRIM 800-160 MG PO TABS: 1.0000 | ORAL_TABLET | Freq: Two times a day (BID) | ORAL | Status: DC

## 2015-07-21 MED ORDER — KETOROLAC TROMETHAMINE 60 MG/2ML IM SOLN
60.0000 mg | Freq: Once | INTRAMUSCULAR | Status: AC
  Administered 2015-07-21: 60 mg via INTRAMUSCULAR
  Filled 2015-07-21: qty 2

## 2015-07-21 NOTE — ED Notes (Signed)
Pt states area to nose red and swollen , states hx of MRSA, denies any congestion, states she wears safety glassess at work and thinks that might be irriatated

## 2015-07-21 NOTE — Discharge Instructions (Signed)

## 2015-07-21 NOTE — ED Provider Notes (Signed)
[facility] Emergency Department Provider Note  ____________________________________________  Time seen: Approximately 9:43 PM  I have reviewed the triage vital signs and the nursing notes.   HISTORY  Chief Complaint Skin Problem    HPI [name] is a 30 y.o. female presents for evaluation of a red swollen nose times one day. Patient thinks that he may be secondary to infection CSF history of MRSA. Denies any trauma.   Past Medical History  Diagnosis Date  . Bipolar 1 disorder (HCC)   . Scoliosis   . Asthma   . GERD (gastroesophageal reflux disease)   . Hyperlipidemia   . Bipolar disorder (HCC)   . Scoliosis   . Allergy   . Ectopic pregnancy   . MRSA (methicillin resistant Staphylococcus aureus)   . Migraine   . History of concussion 2016  . Pseudoseizures     Patient Active Problem List   Diagnosis Date Noted  . Need for diphtheria-tetanus-pertussis (Tdap) vaccine, adult/adolescent 04/05/2015  . Abnormal weight gain 03/12/2015  . Encounter for medication monitoring 03/12/2015  . Abdominal pain, acute, right lower quadrant 03/02/2015  . Bipolar affective disorder (HCC) 01/26/2015  . Convulsion (HCC) 01/25/2015  . Seizures (HCC) 01/12/2015  . Asthma   . Hyperlipidemia   . Bipolar disorder (HCC)   . Allergy   . MRSA (methicillin resistant Staphylococcus aureus)   . Migraine headache with aura     Past Surgical History  Procedure Laterality Date  . Hernia repair  2010  . Tubal ligation    . Carpal tunnel release  2015    left    Current Outpatient Rx  Name  Route  Sig  Dispense  Refill  . celecoxib (CELEBREX) 100 MG capsule   Oral   Take 1 capsule (100 mg total) by mouth 2 (two) times daily.   30 capsule   0   . cetirizine (ZYRTEC) 10 MG tablet   Oral   Take 10 mg by mouth daily.         . cyclobenzaprine (FLEXERIL) 10 MG tablet   Oral   Take 1 tablet (10 mg total) by mouth 3 (three) times daily as needed for  muscle spasms.   30 tablet   0   . diphenhydrAMINE (BENADRYL) 25 MG tablet   Oral   Take 25 mg by mouth at bedtime.         . divalproex (DEPAKOTE) 500 MG DR tablet   Oral   Take 500 mg by mouth daily.          Marland Kitchen FLUoxetine (PROZAC) 20 MG tablet   Oral   Take 20 mg by mouth daily.         Marland Kitchen ketorolac (TORADOL) 10 MG tablet   Oral   Take 1 tablet (10 mg total) by mouth every 6 (six) hours as needed.   20 tablet   0   . montelukast (SINGULAIR) 10 MG tablet   Oral   Take 10 mg by mouth at bedtime.         . Multiple Vitamin (MULTIVITAMIN) tablet   Oral   Take 1 tablet by mouth daily.         . nabumetone (RELAFEN) 750 MG tablet   Oral   Take 1 tablet (750 mg total) by mouth 2 (two) times daily as needed. Take with food; do NOT take with other NSAIDs   40 tablet   0   . oxyCODONE-acetaminophen (ROXICET) 5-325 MG tablet  Oral   Take 1-2 tablets by mouth every 4 (four) hours as needed for severe pain.   15 tablet   0   . sulfamethoxazole-trimethoprim (BACTRIM DS,SEPTRA DS) 800-160 MG tablet   Oral   Take 1 tablet by mouth 2 (two) times daily.   20 tablet   0   . VENTOLIN HFA 108 (90 BASE) MCG/ACT inhaler   Inhalation   Inhale 2 puffs into the lungs every 4 (four) hours as needed for wheezing or shortness of breath.   1 Inhaler   1     Dispense as written.   . vitamin B-12 (CYANOCOBALAMIN) 1000 MCG tablet   Oral   Take 1,000 mcg by mouth daily.         . vitamin C (ASCORBIC ACID) 500 MG tablet   Oral   Take 500 mg by mouth daily.           Allergies Robitussin (alcohol free); Aspirin; Tramadol; and Codeine  Family History  Problem Relation Age of Onset  . Asthma Mother   . Heart disease Mother   . Diabetes Mother   . Hyperlipidemia Mother   . Hypertension Mother   . Mental illness Mother   . Migraines Mother   . Thyroid disease Mother   . Stroke Mother   . Hypertension Sister   . Cancer Maternal Grandmother   . Diabetes  Maternal Grandmother   . Heart disease Maternal Grandmother   . Hyperlipidemia Maternal Grandmother   . Hypertension Maternal Grandmother   . Kidney disease Maternal Grandmother   . Heart disease Paternal Grandfather     Social History Social History  Substance Use Topics  . Smoking status: Current Every Day Smoker -- 1.00 packs/day    Types: Cigarettes  . Smokeless tobacco: Never Used  . Alcohol Use: No    Review of Systems Constitutional: No fever/chills Eyes: No visual changes. ENT: Nose mainly left side swollen tender near erythematous. Cardiovascular: Denies chest pain. Respiratory: Denies shortness of breath. Gastrointestinal: No abdominal pain.  No nausea, no vomiting.  No diarrhea.  No constipation. Genitourinary: Negative for dysuria. Musculoskeletal: Negative for back pain. Skin: Negative for rash. Neurological: Negative for headaches, focal weakness or numbness.  10-point ROS otherwise negative.  ____________________________________________   PHYSICAL EXAM:  VITAL SIGNS: ED Triage Vitals  Enc Vitals Group     BP 07/21/15 1800 118/75 mmHg     Pulse Rate 07/21/15 1800 94     Resp 07/21/15 1800 18     Temp 07/21/15 1800 98.9 F (37.2 C)     Temp Source 07/21/15 1800 Oral     SpO2 07/21/15 1800 99 %     Weight 07/21/15 1800 171 lb (77.565 kg)     Height 07/21/15 1800  (1.651 m)     Head Cir --      Peak Flow --      Pain Score 07/21/15 1801 8     Pain Loc --      Pain Edu? --      Excl. in GC? --     Constitutional: Alert and oriented. Well appearing and in no acute distress. Eyes: Conjunctivae are normal. PERRL. EOMI. Head: Atraumatic. Nose: Nose with skin erythematous tender and mildly edematous. Mouth/Throat: Mucous membranes are moist.  Oropharynx non-erythematous. Neck: No stridor.   Cardiovascular: Normal rate, regular rhythm. Grossly normal heart sounds.  Good peripheral circulation. Respiratory: Normal respiratory effort.  No  retractions. Lungs CTAB. Neurologic:  Normal speech and  language. No gross focal neurologic deficits are appreciated. No gait instability. Skin:  Skin is warm, dry and intact. No rash noted. Psychiatric: Mood and affect are normal. Speech and behavior are normal.  ____________________________________________   LABS (all labs ordered are listed, but only abnormal results are displayed)  Labs Reviewed - No data to display ____________________________________________   PROCEDURES  Procedure(s) performed: None  Critical Care performed: No  ____________________________________________   INITIAL IMPRESSION / ASSESSMENT AND PLAN / ED COURSE  Pertinent labs & imaging results that were available during my care of the patient were reviewed by me and considered in my medical decision making (see chart for details).  Acute cellulitis of the external nose. Rx given for Bactrim DS twice a day, clindamycin 300 mg 3 times a day. Patient also given Percocet 5/325 for pain she is to follow up PCP or return to the ER with any worsening symptomology. Patient voices no other emergency medical complaints at this time. ____________________________________________   FINAL CLINICAL IMPRESSION(S) / ED DIAGNOSES  Final diagnoses:  Cellulitis of nose, external      Evangeline Dakin, PA-C 07/21/15 2145  Gayla Doss, MD 07/22/15 [phone]

## 2015-08-09 ENCOUNTER — Emergency Department: Payer: Managed Care, Other (non HMO)

## 2015-08-09 ENCOUNTER — Encounter: Payer: Self-pay | Admitting: Emergency Medicine

## 2015-08-09 ENCOUNTER — Emergency Department
Admission: EM | Admit: 2015-08-09 | Discharge: 2015-08-09 | Disposition: A | Discharge status: Home / Self Care | Payer: Managed Care, Other (non HMO) | Attending: Emergency Medicine | Admitting: Emergency Medicine

## 2015-08-09 DIAGNOSIS — Z79899 Other long term (current) drug therapy: Secondary | ICD-10-CM | POA: Insufficient documentation

## 2015-08-09 DIAGNOSIS — A0811 Acute gastroenteropathy due to Norwalk agent: Secondary | ICD-10-CM | POA: Insufficient documentation

## 2015-08-09 DIAGNOSIS — Z791 Long term (current) use of non-steroidal anti-inflammatories (NSAID): Secondary | ICD-10-CM | POA: Diagnosis not present

## 2015-08-09 DIAGNOSIS — Z3202 Encounter for pregnancy test, result negative: Secondary | ICD-10-CM | POA: Insufficient documentation

## 2015-08-09 DIAGNOSIS — Z792 Long term (current) use of antibiotics: Secondary | ICD-10-CM | POA: Diagnosis not present

## 2015-08-09 DIAGNOSIS — R509 Fever, unspecified: Secondary | ICD-10-CM | POA: Diagnosis present

## 2015-08-09 DIAGNOSIS — F17210 Nicotine dependence, cigarettes, uncomplicated: Secondary | ICD-10-CM | POA: Insufficient documentation

## 2015-08-09 LAB — CBC WITH DIFFERENTIAL/PLATELET
Basophils Absolute: 0.1 10*3/uL (ref 0–0.1)
Basophils Relative: 1 %
Eosinophils Absolute: 0.2 10*3/uL (ref 0–0.7)
Eosinophils Relative: 3 %
HEMATOCRIT: 36.6 % (ref 35.0–47.0)
Hemoglobin: 12.2 g/dL (ref 12.0–16.0)
LYMPHS ABS: 2.4 10*3/uL (ref 1.0–3.6)
LYMPHS PCT: 31 %
MCH: 30.3 pg (ref 26.0–34.0)
MCHC: 33.4 g/dL (ref 32.0–36.0)
MCV: 90.6 fL (ref 80.0–100.0)
MONO ABS: 0.7 10*3/uL (ref 0.2–0.9)
MONOS PCT: 9 %
NEUTROS ABS: 4.3 10*3/uL (ref 1.4–6.5)
Neutrophils Relative %: 56 %
Platelets: 223 10*3/uL (ref 150–440)
RBC: 4.04 MIL/uL (ref 3.80–5.20)
RDW: 13.8 % (ref 11.5–14.5)
WBC: 7.7 10*3/uL (ref 3.6–11.0)

## 2015-08-09 LAB — COMPREHENSIVE METABOLIC PANEL
ALBUMIN: 3.7 g/dL (ref 3.5–5.0)
ALK PHOS: 37 U/L — AB (ref 38–126)
ALT: 15 U/L (ref 14–54)
ANION GAP: 5 (ref 5–15)
AST: 17 U/L (ref 15–41)
BUN: 9 mg/dL (ref 6–20)
CHLORIDE: 107 mmol/L (ref 101–111)
CO2: 26 mmol/L (ref 22–32)
Calcium: 8.7 mg/dL — ABNORMAL LOW (ref 8.9–10.3)
Creatinine, Ser: 0.56 mg/dL (ref 0.44–1.00)
GFR calc Af Amer: >60 mL/min (ref >60)
GFR calc non Af Amer: >60 mL/min (ref >60)
GLUCOSE: 87 mg/dL (ref 65–99)
POTASSIUM: 3.6 mmol/L (ref 3.5–5.1)
SODIUM: 138 mmol/L (ref 135–145)
Total Bilirubin: 0.2 mg/dL — ABNORMAL LOW (ref 0.3–1.2)
Total Protein: 6.4 g/dL — ABNORMAL LOW (ref 6.5–8.1)

## 2015-08-09 LAB — URINALYSIS COMPLETE WITH MICROSCOPIC (ARMC ONLY)
BACTERIA UA: NONE SEEN
Bilirubin Urine: NEGATIVE
GLUCOSE, UA: NEGATIVE mg/dL
HGB URINE DIPSTICK: NEGATIVE
Ketones, ur: NEGATIVE mg/dL
Nitrite: NEGATIVE
Protein, ur: NEGATIVE mg/dL
Specific Gravity, Urine: 1.01 (ref 1.005–1.030)
pH: 6 (ref 5.0–8.0)

## 2015-08-09 LAB — POCT PREGNANCY, URINE: PREG TEST UR: NEGATIVE

## 2015-08-09 LAB — RAPID INFLUENZA A&B ANTIGENS: Influenza A (ARMC): NOT DETECTED

## 2015-08-09 LAB — RAPID INFLUENZA A&B ANTIGENS (ARMC ONLY): INFLUENZA B (ARMC): NOT DETECTED

## 2015-08-09 MED ORDER — IOHEXOL 240 MG/ML SOLN
25.0000 mL | Freq: Once | INTRAMUSCULAR | Status: AC | PRN
  Administered 2015-08-09: 25 mL via ORAL
  Filled 2015-08-09: qty 25

## 2015-08-09 MED ORDER — SODIUM CHLORIDE 0.9 % IV BOLUS (SEPSIS)
1000.0000 mL | Freq: Once | INTRAVENOUS | Status: AC
  Administered 2015-08-09: 1000 mL via INTRAVENOUS

## 2015-08-09 MED ORDER — IOHEXOL 300 MG/ML  SOLN
100.0000 mL | Freq: Once | INTRAMUSCULAR | Status: AC | PRN
  Administered 2015-08-09: 100 mL via INTRAVENOUS
  Filled 2015-08-09: qty 100

## 2015-08-09 MED ORDER — LOPERAMIDE HCL 2 MG PO TABS: 2.0000 mg | ORAL_TABLET | Freq: Four times a day (QID) | ORAL | Status: DC | PRN

## 2015-08-09 MED ORDER — ONDANSETRON HCL 4 MG/2ML IJ SOLN
4.0000 mg | Freq: Once | INTRAMUSCULAR | Status: AC
  Administered 2015-08-09: 4 mg via INTRAVENOUS
  Filled 2015-08-09: qty 2

## 2015-08-09 MED ORDER — ONDANSETRON 4 MG PO TBDP: 4.0000 mg | ORAL_TABLET | Freq: Three times a day (TID) | ORAL | Status: DC | PRN

## 2015-08-09 NOTE — Discharge Instructions (Signed)
Norovirus Infection A norovirus infection is caused by exposure to a virus in a group of similar viruses (noroviruses). This type of infection causes inflammation in your stomach and intestines (gastroenteritis). Norovirus is the most common cause of gastroenteritis. It also causes food poisoning. Anyone can get a norovirus infection. It spreads very easily (contagious). You can get it from contaminated food, water, surfaces, or other people. Norovirus is found in the stool or vomit of infected people. You can spread the infection as soon as you feel sick until 2 weeks after you recover.  Symptoms usually begin within 2 days after you become infected. Most norovirus symptoms affect the digestive system. CAUSES Norovirus infection is caused by contact with norovirus. You can catch norovirus if you:  Eat or drink something contaminated with norovirus.  Touch surfaces or objects contaminated with norovirus and then put your hand in your mouth.  Have direct contact with an infected person who has symptoms.  Share food, drink, or utensils with someone with who is sick with norovirus. SIGNS AND SYMPTOMS Symptoms of norovirus may include:  Nausea.  Vomiting.  Diarrhea.  Stomach cramps.  Fever.  Chills.  Headache.  Muscle aches.  Tiredness. DIAGNOSIS Your health care provider may suspect norovirus based on your symptoms and physical exam. Your health care provider may also test a sample of your stool or vomit for the virus.  TREATMENT There is no specific treatment for norovirus. Most people get better without treatment in about 2 days. HOME CARE INSTRUCTIONS  Replace lost fluids by drinking plenty of water or rehydration fluids containing important minerals called electrolytes. This prevents dehydration. Drink enough fluid to keep your urine clear or pale yellow.  Do not prepare food for others while you are infected. Wait at least 3 days after recovering from the illness to do  that. PREVENTION   Wash your hands often, especially after using the toilet or changing a diaper.  Wash fruits and vegetables thoroughly before preparing or serving them.  Throw out any food that a sick person may have touched.  Disinfect contaminated surfaces immediately after someone in the household has been sick. Use a bleach-based household cleaner.  Immediately remove and wash soiled clothes or sheets. SEEK MEDICAL CARE IF:  Your vomiting, diarrhea, and stomach pain is getting worse.  Your symptoms of norovirus do not go away after 2-3 days. SEEK IMMEDIATE MEDICAL CARE IF:  You develop symptoms of dehydration that do not improve with fluid replacement. This may include:  Excessive sleepiness.  Lack of tears.  Dry mouth.  Dizziness when standing.  Weak pulse.   This information is not intended to replace advice given to you by your health care provider. Make sure you discuss any questions you have with your health care provider.   Document Released: 09/01/2002 Document Revised: 07/02/2014 Document Reviewed: 11/19/2013 Elsevier Interactive Patient Education 2016 [place] Choices to Help Relieve Diarrhea, Adult When you have diarrhea, the foods you eat and your eating habits are very important. Choosing the right foods and drinks can help relieve diarrhea. Also, because diarrhea can last up to 7 days, you need to replace lost fluids and electrolytes (such as sodium, potassium, and chloride) in order to help prevent dehydration.  WHAT GENERAL GUIDELINES DO I NEED TO FOLLOW?  Slowly drink 1 cup (8 oz) of fluid for each episode of diarrhea. If you are getting enough fluid, your urine will be clear or pale yellow.  Eat starchy foods. Some good choices  include white rice, white toast, pasta, low-fiber cereal, baked potatoes (without the skin), saltine crackers, and bagels.  Avoid large servings of any cooked vegetables.  Limit fruit to two servings per day. A  serving is  cup or 1 small piece.  Choose foods with less than 2 g of fiber per serving.  Limit fats to less than 8 tsp (38 g) per day.  Avoid fried foods.  Eat foods that have probiotics in them. Probiotics can be found in certain dairy products.  Avoid foods and beverages that may increase the speed at which food moves through the stomach and intestines (gastrointestinal tract). Things to avoid include:  High-fiber foods, such as dried fruit, raw fruits and vegetables, nuts, seeds, and whole grain foods.  Spicy foods and high-fat foods.  Foods and beverages sweetened with high-fructose corn syrup, honey, or sugar alcohols such as xylitol, sorbitol, and mannitol. WHAT FOODS ARE RECOMMENDED? Grains White rice. White, [place], or pita breads (fresh or toasted), including plain rolls, buns, or bagels. White pasta. Saltine, soda, or graham crackers. Pretzels. Low-fiber cereal. Cooked cereals made with water (such as cornmeal, farina, or cream cereals). Plain muffins. Matzo. Melba toast. Zwieback.  Vegetables Potatoes (without the skin). Strained tomato and vegetable juices. Most well-cooked and canned vegetables without seeds. Tender lettuce. Fruits Cooked or canned applesauce, apricots, cherries, fruit cocktail, grapefruit, peaches, pears, or plums. Fresh bananas, apples without skin, cherries, grapes, cantaloupe, grapefruit, peaches, oranges, or plums.  Meat and Other Protein Products Baked or boiled chicken. Eggs. Tofu. Fish. Seafood. Smooth peanut butter. Ground or well-cooked tender beef, ham, veal, lamb, pork, or poultry.  Dairy Plain yogurt, kefir, and unsweetened liquid yogurt. Lactose-free milk, buttermilk, or soy milk. Plain hard cheese. Beverages Sport drinks. Clear broths. Diluted fruit juices (except prune). Regular, caffeine-free sodas such as ginger ale. Water. Decaffeinated teas. Oral rehydration solutions. Sugar-free beverages not sweetened with sugar  alcohols. Other Bouillon, broth, or soups made from recommended foods.  The items listed above may not be a complete list of recommended foods or beverages. Contact your dietitian for more options. WHAT FOODS ARE NOT RECOMMENDED? Grains Whole grain, whole wheat, bran, or rye breads, rolls, pastas, crackers, and cereals. Wild or brown rice. Cereals that contain more than 2 g of fiber per serving. Corn tortillas or taco shells. Cooked or dry oatmeal. Granola. Popcorn. Vegetables Raw vegetables. Cabbage, broccoli, Brussels sprouts, artichokes, baked beans, beet greens, corn, kale, legumes, peas, sweet potatoes, and yams. Potato skins. Cooked spinach and cabbage. Fruits Dried fruit, including raisins and dates. Raw fruits. Stewed or dried prunes. Fresh apples with skin, apricots, mangoes, pears, raspberries, and strawberries.  Meat and Other Protein Products Chunky peanut butter. Nuts and seeds. Beans and lentils. Berniece Salines.  Dairy High-fat cheeses. Milk, chocolate milk, and beverages made with milk, such as milk shakes. Cream. Ice cream. Sweets and Desserts Sweet rolls, doughnuts, and sweet breads. Pancakes and waffles. Fats and Oils Butter. Cream sauces. Margarine. Salad oils. Plain salad dressings. Olives. Avocados.  Beverages Caffeinated beverages (such as coffee, tea, soda, or energy drinks). Alcoholic beverages. Fruit juices with pulp. Prune juice. Soft drinks sweetened with high-fructose corn syrup or sugar alcohols. Other Coconut. Hot sauce. Chili powder. Mayonnaise. Gravy. Cream-based or milk-based soups.  The items listed above may not be a complete list of foods and beverages to avoid. Contact your dietitian for more information. WHAT SHOULD I DO IF I BECOME DEHYDRATED? Diarrhea can sometimes lead to dehydration. Signs of dehydration include dark urine and dry  mouth and skin. If you think you are dehydrated, you should rehydrate with an oral rehydration solution. These solutions can be  purchased at pharmacies, retail stores, or online.  °Drink ½-1 cup (120-240 mL) of oral rehydration solution each time you have an episode of diarrhea. If drinking this amount makes your diarrhea worse, try drinking smaller amounts more often. For example, drink 1-3 tsp (5-15 mL) every 5-10 minutes.  °A general rule for staying hydrated is to drink 1½-2 L of fluid per day. Talk to your health care provider about the specific amount you should be drinking each day. Drink enough fluids to keep your urine clear or pale yellow. °  °This information is not intended to replace advice given to you by your health care provider. Make sure you discuss any questions you have with your health care provider. °  °Document Released: 09/01/2003 Document Revised: 07/02/2014 Document Reviewed: 05/04/2013 °Elsevier Interactive Patient Education ©2016 Elsevier Inc. ° °

## 2015-08-09 NOTE — ED Notes (Signed)
Pt to ed with c/o fever and body aches that started last night.  Pt states last dose tylenol was at 2pm today.  Pt afebrile at triage.

## 2015-08-09 NOTE — ED Notes (Signed)
Pt reports nausea, diarrhea and upper abd pain.   Sx for 2 days.  Pt reports cold and congestion also.  cig smoker.  Pt alert.

## 2015-08-09 NOTE — ED Provider Notes (Signed)
[facility] Emergency Department Provider Note  ____________________________________________  Time seen: Approximately 4:03 PM  I have reviewed the triage vital signs and the nursing notes.   HISTORY  Chief Complaint Fever    HPI [name] is a 30 y.o. female who presents emergency department complaining of fevers and chills, bodyaches, cough, right lower quadrant abdominal pain, nausea vomiting and diarrhea. Patient states that symptoms have been ongoing for the last 2-3 days. Patient has been taking Tylenol at home for fever reduction which she says is effective for 2-3 hours then it returns. Patient is endorsing severe chills at this time. She states that she has had a dry nonproductive cough but denies any nasal congestion or sore throat. She denies any headache or visual acuity changes, chest pain, shortness of breath. Patient does endorse some right lower quadrant abdominal pain with associated nausea and vomiting as well as one episode of diarrhea. Patient states that she has been anorexic since this morning due to nausea vomiting and abdominal pain.   Past Medical History  Diagnosis Date  . Bipolar 1 disorder (HCC)   . Scoliosis   . Asthma   . GERD (gastroesophageal reflux disease)   . Hyperlipidemia   . Bipolar disorder (HCC)   . Scoliosis   . Allergy   . Ectopic pregnancy   . MRSA (methicillin resistant Staphylococcus aureus)   . Migraine   . History of concussion 2016  . Pseudoseizures     Patient Active Problem List   Diagnosis Date Noted  . Need for diphtheria-tetanus-pertussis (Tdap) vaccine, adult/adolescent 04/05/2015  . Abnormal weight gain 03/12/2015  . Encounter for medication monitoring 03/12/2015  . Abdominal pain, acute, right lower quadrant 03/02/2015  . Bipolar affective disorder (HCC) 01/26/2015  . Convulsion (HCC) 01/25/2015  . Seizures (HCC) 01/12/2015  . Asthma   . Hyperlipidemia   . Bipolar disorder (HCC)   .  Allergy   . MRSA (methicillin resistant Staphylococcus aureus)   . Migraine headache with aura     Past Surgical History  Procedure Laterality Date  . Hernia repair  2010  . Tubal ligation    . Carpal tunnel release  2015    left    Current Outpatient Rx  Name  Route  Sig  Dispense  Refill  . celecoxib (CELEBREX) 100 MG capsule   Oral   Take 1 capsule (100 mg total) by mouth 2 (two) times daily.   30 capsule   0   . cetirizine (ZYRTEC) 10 MG tablet   Oral   Take 10 mg by mouth daily.         . cyclobenzaprine (FLEXERIL) 10 MG tablet   Oral   Take 1 tablet (10 mg total) by mouth 3 (three) times daily as needed for muscle spasms.   30 tablet   0   . diphenhydrAMINE (BENADRYL) 25 MG tablet   Oral   Take 25 mg by mouth at bedtime.         . divalproex (DEPAKOTE) 500 MG DR tablet   Oral   Take 500 mg by mouth daily.          Marland Kitchen FLUoxetine (PROZAC) 20 MG tablet   Oral   Take 20 mg by mouth daily.         Marland Kitchen ketorolac (TORADOL) 10 MG tablet   Oral   Take 1 tablet (10 mg total) by mouth every 6 (six) hours as needed.   20 tablet   0   .  loperamide (IMODIUM A-D) 2 MG tablet   Oral   Take 1 tablet (2 mg total) by mouth 4 (four) times daily as needed for diarrhea or loose stools.   30 tablet   0   . montelukast (SINGULAIR) 10 MG tablet   Oral   Take 10 mg by mouth at bedtime.         . Multiple Vitamin (MULTIVITAMIN) tablet   Oral   Take 1 tablet by mouth daily.         . nabumetone (RELAFEN) 750 MG tablet   Oral   Take 1 tablet (750 mg total) by mouth 2 (two) times daily as needed. Take with food; do NOT take with other NSAIDs   40 tablet   0   . ondansetron (ZOFRAN-ODT) 4 MG disintegrating tablet   Oral   Take 1 tablet (4 mg total) by mouth every 8 (eight) hours as needed for nausea or vomiting.   20 tablet   0   . oxyCODONE-acetaminophen (ROXICET) 5-325 MG tablet   Oral   Take 1-2 tablets by mouth every 4 (four) hours as needed for severe  pain.   15 tablet   0   . sulfamethoxazole-trimethoprim (BACTRIM DS,SEPTRA DS) 800-160 MG tablet   Oral   Take 1 tablet by mouth 2 (two) times daily.   20 tablet   0   . VENTOLIN HFA 108 (90 BASE) MCG/ACT inhaler   Inhalation   Inhale 2 puffs into the lungs every 4 (four) hours as needed for wheezing or shortness of breath.   1 Inhaler   1     Dispense as written.   . vitamin B-12 (CYANOCOBALAMIN) 1000 MCG tablet   Oral   Take 1,000 mcg by mouth daily.         . vitamin C (ASCORBIC ACID) 500 MG tablet   Oral   Take 500 mg by mouth daily.           Allergies Robitussin (alcohol free); Aspirin; Tramadol; and Codeine  Family History  Problem Relation Age of Onset  . Asthma Mother   . Heart disease Mother   . Diabetes Mother   . Hyperlipidemia Mother   . Hypertension Mother   . Mental illness Mother   . Migraines Mother   . Thyroid disease Mother   . Stroke Mother   . Hypertension Sister   . Cancer Maternal Grandmother   . Diabetes Maternal Grandmother   . Heart disease Maternal Grandmother   . Hyperlipidemia Maternal Grandmother   . Hypertension Maternal Grandmother   . Kidney disease Maternal Grandmother   . Heart disease Paternal Grandfather     Social History Social History  Substance Use Topics  . Smoking status: Current Every Day Smoker -- 1.00 packs/day    Types: Cigarettes  . Smokeless tobacco: Never Used  . Alcohol Use: No     Review of Systems  Constitutional: Positive for fever/chills Eyes: No visual changes. No discharge ENT: No sore throat. Denies nasal congestion. Denies ear pain. Cardiovascular: no chest pain. Respiratory: Positive for dry cough. No SOB. Gastrointestinal: Positive for right lower quadrant abdominal pain.  Positive for nausea, vomiting, and diarrhea. No constipation. Genitourinary: Negative for dysuria. No hematuria Musculoskeletal: Negative for back pain. Skin: Negative for rash. Neurological: Negative for  headaches, focal weakness or numbness. 10-point ROS otherwise negative.  ____________________________________________   PHYSICAL EXAM:  VITAL SIGNS: ED Triage Vitals  Enc Vitals Group     BP 08/09/15 1554 114/48  mmHg     Pulse Rate 08/09/15 1554 89     Resp 08/09/15 1554 20     Temp 08/09/15 1554 98.5 F (36.9 C)     Temp Source 08/09/15 1554 Oral     SpO2 08/09/15 1554 97 %     Weight 08/09/15 1554 171 lb (77.565 kg)     Height 08/09/15 1554 5\' 5"  (1.651 m)     Head Cir --      Peak Flow --      Pain Score 08/09/15 1554 7     Pain Loc --      Pain Edu? --      Excl. in GC? --      Constitutional: Alert and oriented. Sick appearing but in no acute distress. Eyes: Conjunctivae are normal. PERRL. EOMI. Head: Atraumatic. ENT:      Ears: EACs and TMs are unremarkable bilaterally.      Nose: No congestion/rhinnorhea.      Mouth/Throat: Mucous membranes are moist. Oropharynx is nonerythematous and nonedematous. Neck: No stridor.  Neck is soft and supple. Hematological/Lymphatic/Immunilogical: No cervical lymphadenopathy. Cardiovascular: Normal rate, regular rhythm. Normal S1 and S2.  Good peripheral circulation. Respiratory: Normal respiratory effort without tachypnea or retractions. Lungs CTAB. No wheezing, rales, rhonchi noted. Gastrointestinal: Bowel sounds 4 quadrants. Abdomen is soft to palpation. Patient is tender to palpation over the right lower quadrant. Negative for rebound tenderness. No rigidity but mild guarding over the right lower quadrant.. No distention. No CVA tenderness. Musculoskeletal: No lower extremity tenderness nor edema.  No joint effusions. Neurologic:  Normal speech and language. No gross focal neurologic deficits are appreciated.  Skin:  Skin is warm, dry and intact. No rash noted. Psychiatric: Mood and affect are normal. Speech and behavior are normal. Patient exhibits appropriate insight and  judgement.   ____________________________________________   LABS (all labs ordered are listed, but only abnormal results are displayed)  Labs Reviewed  COMPREHENSIVE METABOLIC PANEL - Abnormal; Notable for the following:    Calcium 8.7 (*)    Total Protein 6.4 (*)    Alkaline Phosphatase 37 (*)    Total Bilirubin 0.2 (*)    All other components within normal limits  URINALYSIS COMPLETEWITH MICROSCOPIC ([facility] ONLY) - Abnormal; Notable for the following:    Color, Urine YELLOW (*)    APPearance HAZY (*)    Leukocytes, UA 2+ (*)    Squamous Epithelial / LPF 0-5 (*)    All other components within normal limits  RAPID INFLUENZA A&B ANTIGENS ([facility] ONLY)  CBC WITH DIFFERENTIAL/PLATELET  POC URINE PREG, ED  POCT PREGNANCY, URINE   ____________________________________________  EKG   ____________________________________________  RADIOLOGY Festus Barren Cuthriell, personally viewed and evaluated these images (plain radiographs) as part of my medical decision making, as well as reviewing the written report by the radiologist.  Dg Chest 2 View  08/09/2015  CLINICAL DATA:  Fever and congestion EXAM: CHEST  2 VIEW COMPARISON:  August 19, 2014 FINDINGS: Lungs are clear. Heart size and pulmonary vascularity are normal. No adenopathy. No bone lesions. IMPRESSION: No edema or consolidation. Electronically Signed   By: Bretta Bang III M.D.   On: 08/09/2015 16:25   Ct Abdomen Pelvis W Contrast  08/09/2015  CLINICAL DATA:  Fever and body aches day. EXAM: CT ABDOMEN AND PELVIS WITH CONTRAST TECHNIQUE: Multidetector CT imaging of the abdomen and pelvis was performed using the standard protocol following bolus administration of intravenous contrast. CONTRAST:  OMNIPAQUE IOHEXOL 300 MG/ML  SOLN COMPARISON:  None. FINDINGS: Minimal bibasilar atelectasis Liver, gallbladder, spleen, pancreas, adrenal glands, and kidneys are within normal limits. Bladder and uterus are within normal limits.  Small amount of free fluid is layering in the pelvis. Cystic changes are noted in the ovaries. The appendix is not clearly visualized. No evidence of bowel obstruction. No abnormal adenopathy in the retroperitoneum or mesenteric. IMPRESSION: No acute intra-abdominal pathology. Electronically Signed   By: Jolaine Click M.D.   On: 08/09/2015 18:21    ____________________________________________    PROCEDURES  Procedure(s) performed:       Medications  sodium chloride 0.9 % bolus 1,000 mL (1,000 mLs Intravenous New Bag/Given 08/09/15 1645)  ondansetron (ZOFRAN) injection 4 mg (4 mg Intravenous Given 08/09/15 1646)  iohexol (OMNIPAQUE) 240 MG/ML injection 25 mL (25 mLs Oral Contrast Given 08/09/15 1647)  iohexol (OMNIPAQUE) 300 MG/ML solution 100 mL (100 mLs Intravenous Contrast Given 08/09/15 1800)     ____________________________________________   INITIAL IMPRESSION / ASSESSMENT AND PLAN / ED COURSE  Pertinent labs & imaging results that were available during my care of the patient were reviewed by me and considered in my medical decision making (see chart for details).  Patient's diagnosis is consistent with normal virus. Patient's laboratory results as well as CT scan is reassuring. There is no acute or significant abnormalities to results or physical exam. Patient will be discharged home with prescriptions for antinausea and antidiarrheal medications. She is to continue taking Tylenol and ibuprofen for additional symptom control.. Patient is to follow up with primary care provider if symptoms persist past this treatment course. Patient is given ED precautions to return to the ED for any worsening or new symptoms.     ____________________________________________  FINAL CLINICAL IMPRESSION(S) / ED DIAGNOSES  Final diagnoses:  Norovirus      NEW MEDICATIONS STARTED DURING THIS VISIT:  New Prescriptions   LOPERAMIDE (IMODIUM A-D) 2 MG TABLET    Take 1 tablet (2 mg total) by  mouth 4 (four) times daily as needed for diarrhea or loose stools.   ONDANSETRON (ZOFRAN-ODT) 4 MG DISINTEGRATING TABLET    Take 1 tablet (4 mg total) by mouth every 8 (eight) hours as needed for nausea or vomiting.        Delorise Royals Cuthriell, PA-C 08/09/15 1831  Governor Rooks, MD 08/09/15 2121

## 2015-08-09 NOTE — ED Notes (Signed)
poct pregnancy Negative 

## 2015-08-12 ENCOUNTER — Encounter: Payer: Self-pay | Admitting: Family Medicine

## 2015-08-12 ENCOUNTER — Ambulatory Visit (INDEPENDENT_AMBULATORY_CARE_PROVIDER_SITE_OTHER): Payer: Managed Care, Other (non HMO) | Admitting: Family Medicine

## 2015-08-12 VITALS — BP 97/62 | HR 79 | Temp 98.6°F | Ht 64.8 in | Wt 173.0 lb

## 2015-08-12 DIAGNOSIS — N3000 Acute cystitis without hematuria: Secondary | ICD-10-CM

## 2015-08-12 DIAGNOSIS — A084 Viral intestinal infection, unspecified: Secondary | ICD-10-CM | POA: Diagnosis not present

## 2015-08-12 MED ORDER — NITROFURANTOIN MONOHYD MACRO 100 MG PO CAPS: 100.0000 mg | ORAL_CAPSULE | Freq: Two times a day (BID) | ORAL | Status: AC

## 2015-08-12 NOTE — Progress Notes (Signed)
BP 97/62 mmHg  Pulse 79  Temp(Src) 98.6 F (37 C)  Ht 5' 4.8" (1.646 m)  Wt 173 lb (78.472 kg)  BMI 28.96 kg/m2  SpO2 99%  LMP 07/11/2015 (Exact Date)   Subjective:    Patient ID: [name], female    DOB: [birth year], 30 y.o.   MRN: [id number]  HPI: [name] is a 30 y.o. female  Chief Complaint  Patient presents with  . follow up from Hospital on Tuesday    still vomiting, no better   Went to ER on Tuesday after being sick for 2-3 days; she had vomiting, nausea, diarrhea, fever; they did labs, CXR, CT scan; they gave her IV fluids; gave her zofran and imodium; she got the zofran filled but not the imodium ($$); she is still nauseated and vomiting; still having diarrhea; no blood, no travel; constant headache; still taking tylenol; abdomen was not really hurting, just tender when he touched it in the ER; stomach just empty, "not actually hurting hurting" she says; her legs and arms and shoulders ache  Reviewed labs; 2+ LE in the urine; no dysuria; getting ready to start her period tomorrow  Relevant past medical hx reviewed Interim medical history since our last visit reviewed. Allergies and medications reviewed and updated.  Review of Systems  Per HPI unless specifically indicated above     Objective:    BP 97/62 mmHg  Pulse 79  Temp(Src) 98.6 F (37 C)  Ht 5' 4.8" (1.646 m)  Wt 173 lb (78.472 kg)  BMI 28.96 kg/m2  SpO2 99%  LMP 07/11/2015 (Exact Date)  Wt Readings from Last 3 Encounters:  08/12/15 173 lb (78.472 kg)  08/09/15 171 lb (77.565 kg)  07/21/15 171 lb (77.565 kg)    Physical Exam  Constitutional: She appears well-developed and well-nourished. No distress.  Appears to not feel well but nontoxic  Cardiovascular: Normal rate and regular rhythm.   Pulmonary/Chest: Effort normal and breath sounds normal.  Abdominal: Soft. Bowel sounds are normal. She exhibits no distension. There is no tenderness.  Lymphadenopathy:    She has no cervical  adenopathy.  Skin: She is not diaphoretic. No pallor.  Psychiatric: She has a normal mood and affect.    Results for orders placed or performed during the hospital encounter of 08/09/15  Rapid Influenza A&B [facility] only)  Result Value Ref Range   Influenza A [facility]) NOT DETECTED    Influenza B [facility]) NOT DETECTED   Comprehensive metabolic panel  Result Value Ref Range   Sodium 138 135 - 145 mmol/L   Potassium 3.6 3.5 - 5.1 mmol/L   Chloride 107 101 - 111 mmol/L   CO2 26 22 - 32 mmol/L   Glucose, Bld 87 65 - 99 mg/dL   BUN 9 6 - 20 mg/dL   Creatinine, Ser 1.32 0.44 - 1.00 mg/dL   Calcium 8.7 (L) 8.9 - 10.3 mg/dL   Total Protein 6.4 (L) 6.5 - 8.1 g/dL   Albumin 3.7 3.5 - 5.0 g/dL   AST 17 15 - 41 U/L   ALT 15 14 - 54 U/L   Alkaline Phosphatase 37 (L) 38 - 126 U/L   Total Bilirubin 0.2 (L) 0.3 - 1.2 mg/dL   GFR calc non Af Amer >60 >60 mL/min   GFR calc Af Amer >60 >60 mL/min   Anion gap 5 5 - 15  CBC with Differential  Result Value Ref Range   WBC 7.7 3.6 - 11.0 K/uL  RBC 4.04 3.80 - 5.20 MIL/uL   Hemoglobin 12.2 12.0 - 16.0 g/dL   HCT 16.1 09.6 - 04.5 %   MCV 90.6 80.0 - 100.0 fL   MCH 30.3 26.0 - 34.0 pg   MCHC 33.4 32.0 - 36.0 g/dL   RDW 40.9 81.1 - 91.4 %   Platelets 223 150 - 440 K/uL   Neutrophils Relative % 56 %   Neutro Abs 4.3 1.4 - 6.5 K/uL   Lymphocytes Relative 31 %   Lymphs Abs 2.4 1.0 - 3.6 K/uL   Monocytes Relative 9 %   Monocytes Absolute 0.7 0.2 - 0.9 K/uL   Eosinophils Relative 3 %   Eosinophils Absolute 0.2 0 - 0.7 K/uL   Basophils Relative 1 %   Basophils Absolute 0.1 0 - 0.1 K/uL  Urinalysis complete, with microscopic ([facility] only)  Result Value Ref Range   Color, Urine YELLOW (A) YELLOW   APPearance HAZY (A) CLEAR   Glucose, UA NEGATIVE NEGATIVE mg/dL   Bilirubin Urine NEGATIVE NEGATIVE   Ketones, ur NEGATIVE NEGATIVE mg/dL   Specific Gravity, Urine 1.010 1.005 - 1.030   Hgb urine dipstick NEGATIVE NEGATIVE   pH 6.0 5.0 - 8.0    Protein, ur NEGATIVE NEGATIVE mg/dL   Nitrite NEGATIVE NEGATIVE   Leukocytes, UA 2+ (A) NEGATIVE   RBC / HPF 0-5 0 - 5 RBC/hpf   WBC, UA 0-5 0 - 5 WBC/hpf   Bacteria, UA NONE SEEN NONE SEEN   Squamous Epithelial / LPF 0-5 (A) NONE SEEN   Mucous PRESENT   Pregnancy, urine POC  Result Value Ref Range   Preg Test, Ur NEGATIVE NEGATIVE      Assessment & Plan:   Problem List Items Addressed This Visit    None    Visit Diagnoses    Viral gastroenteritis    -  Primary    supportive care; rest, hydration, etc.; out of work today through Sunday, may return Monday if better    Acute cystitis without hematuria        reviewed hospital labs; start antibiotic; call if needed       Follow up plan: No Follow-up on file.  Meds ordered this encounter  Medications  . traZODone (DESYREL) 50 MG tablet    Sig: 50-100 mg at bedtime.  . nitrofurantoin, macrocrystal-monohydrate, (MACROBID) 100 MG capsule    Sig: Take 1 capsule (100 mg total) by mouth 2 (two) times daily.    Dispense:  6 capsule    Refill:  0   An after-visit summary was printed and given to the patient at check-out.  Please see the patient instructions which may contain other information and recommendations beyond what is mentioned above in the assessment and plan.

## 2015-08-12 NOTE — Patient Instructions (Addendum)
GINA-- out of work Friday Saturday and Sunday; may return Monday Try vitamin C (orange juice if not diabetic or vitamin C tablets) and drink green tea to help your immune system during your illness Get plenty of rest and hydration Start the new antibiotics Call if getting worse or go to the ER  Viral Gastroenteritis Viral gastroenteritis is also called stomach flu. This illness is caused by a certain type of germ (virus). It can cause sudden watery poop (diarrhea) and throwing up (vomiting). This can cause you to lose body fluids (dehydration). This illness usually lasts for 3 to 8 days. It usually goes away on its own. HOME CARE   Drink enough fluids to keep your pee (urine) clear or pale yellow. Drink small amounts of fluids often.  Ask your doctor how to replace body fluid losses (rehydration).  Avoid:  Foods high in sugar.  Alcohol.  Bubbly (carbonated) drinks.  Tobacco.  Juice.  Caffeine drinks.  Very hot or cold fluids.  Fatty, greasy foods.  Eating too much at one time.  Dairy products until 24 to 48 hours after your watery poop stops.  You may eat foods with active cultures (probiotics). They can be found in some yogurts and supplements.  Wash your hands well to avoid spreading the illness.  Only take medicines as told by your doctor. Do not give aspirin to children. Do not take medicines for watery poop (antidiarrheals).  Ask your doctor if you should keep taking your regular medicines.  Keep all doctor visits as told. GET HELP RIGHT AWAY IF:   You cannot keep fluids down.  You do not pee at least once every 6 to 8 hours.  You are short of breath.  You see blood in your poop or throw up. This may look like coffee grounds.  You have belly (abdominal) pain that gets worse or is just in one small spot (localized).  You keep throwing up or having watery poop.  You have a fever.  The patient is a child younger than 3 months, and he or she has a  fever.  The patient is a child older than 3 months, and he or she has a fever and problems that do not go away.  The patient is a child older than 3 months, and he or she has a fever and problems that suddenly get worse.  The patient is a baby, and he or she has no tears when crying. MAKE SURE YOU:   Understand these instructions.  Will watch your condition.  Will get help right away if you are not doing well or get worse.   This information is not intended to replace advice given to you by your health care provider. Make sure you discuss any questions you have with your health care provider.   Document Released: 11/28/2007 Document Revised: 09/03/2011 Document Reviewed: 03/28/2011 Elsevier Interactive Patient Education [employer]

## 2015-09-25 ENCOUNTER — Emergency Department
Admission: EM | Admit: 2015-09-25 | Discharge: 2015-09-25 | Disposition: A | Discharge status: Home / Self Care | Payer: Managed Care, Other (non HMO) | Attending: Emergency Medicine | Admitting: Emergency Medicine

## 2015-09-25 DIAGNOSIS — F445 Conversion disorder with seizures or convulsions: Secondary | ICD-10-CM

## 2015-09-25 DIAGNOSIS — R635 Abnormal weight gain: Secondary | ICD-10-CM | POA: Insufficient documentation

## 2015-09-25 DIAGNOSIS — E785 Hyperlipidemia, unspecified: Secondary | ICD-10-CM | POA: Insufficient documentation

## 2015-09-25 DIAGNOSIS — J45909 Unspecified asthma, uncomplicated: Secondary | ICD-10-CM | POA: Diagnosis not present

## 2015-09-25 DIAGNOSIS — R569 Unspecified convulsions: Secondary | ICD-10-CM | POA: Diagnosis present

## 2015-09-25 DIAGNOSIS — G43109 Migraine with aura, not intractable, without status migrainosus: Secondary | ICD-10-CM | POA: Diagnosis not present

## 2015-09-25 DIAGNOSIS — F17210 Nicotine dependence, cigarettes, uncomplicated: Secondary | ICD-10-CM | POA: Diagnosis not present

## 2015-09-25 DIAGNOSIS — Z79899 Other long term (current) drug therapy: Secondary | ICD-10-CM | POA: Insufficient documentation

## 2015-09-25 DIAGNOSIS — M419 Scoliosis, unspecified: Secondary | ICD-10-CM | POA: Insufficient documentation

## 2015-09-25 DIAGNOSIS — F319 Bipolar disorder, unspecified: Secondary | ICD-10-CM | POA: Diagnosis not present

## 2015-09-25 LAB — URINALYSIS COMPLETE WITH MICROSCOPIC (ARMC ONLY)
Bilirubin Urine: NEGATIVE
Glucose, UA: NEGATIVE mg/dL
HGB URINE DIPSTICK: NEGATIVE
LEUKOCYTES UA: NEGATIVE
NITRITE: NEGATIVE
PH: 6 (ref 5.0–8.0)
PROTEIN: NEGATIVE mg/dL
SPECIFIC GRAVITY, URINE: 1.023 (ref 1.005–1.030)

## 2015-09-25 LAB — CBC
HEMATOCRIT: 38.3 % (ref 35.0–47.0)
HEMOGLOBIN: 13 g/dL (ref 12.0–16.0)
MCH: 30.9 pg (ref 26.0–34.0)
MCHC: 34 g/dL (ref 32.0–36.0)
MCV: 90.9 fL (ref 80.0–100.0)
Platelets: 220 10*3/uL (ref 150–440)
RBC: 4.22 MIL/uL (ref 3.80–5.20)
RDW: 13.1 % (ref 11.5–14.5)
WBC: 10.9 10*3/uL (ref 3.6–11.0)

## 2015-09-25 LAB — BASIC METABOLIC PANEL
ANION GAP: 10 (ref 5–15)
BUN: <5 mg/dL — ABNORMAL LOW (ref 6–20)
CHLORIDE: 103 mmol/L (ref 101–111)
CO2: 25 mmol/L (ref 22–32)
CREATININE: 0.64 mg/dL (ref 0.44–1.00)
Calcium: 9.3 mg/dL (ref 8.9–10.3)
GFR calc non Af Amer: >60 mL/min (ref >60)
Glucose, Bld: 83 mg/dL (ref 65–99)
POTASSIUM: 3.3 mmol/L — AB (ref 3.5–5.1)
Sodium: 138 mmol/L (ref 135–145)

## 2015-09-25 LAB — POCT PREGNANCY, URINE: PREG TEST UR: NEGATIVE

## 2015-09-25 MED ORDER — POTASSIUM CHLORIDE CRYS ER 20 MEQ PO TBCR
40.0000 meq | EXTENDED_RELEASE_TABLET | Freq: Once | ORAL | Status: AC
  Administered 2015-09-25: 40 meq via ORAL
  Filled 2015-09-25: qty 2

## 2015-09-25 MED ORDER — SODIUM CHLORIDE 0.9 % IV BOLUS (SEPSIS)
1000.0000 mL | Freq: Once | INTRAVENOUS | Status: AC
  Administered 2015-09-25: 1000 mL via INTRAVENOUS

## 2015-09-25 NOTE — ED Notes (Signed)
Pharmacy called and notified of need of K-DUR. States they will send it. Patient made aware.

## 2015-09-25 NOTE — ED Notes (Signed)
Per EMS, patient comes from home due to seizures. Patient has a hx of bipolar and schizophrenia. Patient has been seen for these seizures multiple times and has been dx with stress/pseudo seizures. Upon arrival patient was rolling her eyes to the back of her head, resisting this RN to open her eye lids. Patient was responsive to pain, she withdrew from nail bed pressure. Patient then opened her eyes. Pupils are round, equal and reactive. No loss of bowel and bladder. Patient is non verbal but will follow simple commands and shake her head yes and no.

## 2015-09-25 NOTE — ED Provider Notes (Signed)
[facility] Emergency Department Provider Note  ____________________________________________  Time seen: Approximately 6:59 PM  I have reviewed the triage vital signs and the nursing notes.   HISTORY  Chief Complaint Seizures  The patient is unwilling to give verbal responses but does comply with examination.  HPI [name] is a 30 y.o. female with a history of bipolar disorder and pseudoseizures presenting for an episode of pseudoseizure. The patient is unwilling to give me any verbal responses except for her allergies and intermittent information. She is unwilling to describe the episode today. She has nodded and consented to treatment with IV fluids, and laboratory studies. She will provide a urine sample.  No hx of recent illness.     Past Medical History  Diagnosis Date  . Bipolar 1 disorder (HCC)   . Scoliosis   . Asthma   . GERD (gastroesophageal reflux disease)   . Hyperlipidemia   . Bipolar disorder (HCC)   . Scoliosis   . Allergy   . Ectopic pregnancy   . MRSA (methicillin resistant Staphylococcus aureus)   . Migraine   . History of concussion 2016  . Pseudoseizures     Patient Active Problem List   Diagnosis Date Noted  . Need for diphtheria-tetanus-pertussis (Tdap) vaccine, adult/adolescent 04/05/2015  . Abnormal weight gain 03/12/2015  . Encounter for medication monitoring 03/12/2015  . Abdominal pain, acute, right lower quadrant 03/02/2015  . Bipolar affective disorder (HCC) 01/26/2015  . Convulsion (HCC) 01/25/2015  . Seizures (HCC) 01/12/2015  . Asthma   . Hyperlipidemia   . Bipolar disorder (HCC)   . Allergy   . MRSA (methicillin resistant Staphylococcus aureus)   . Migraine headache with aura     Past Surgical History  Procedure Laterality Date  . Hernia repair  2010  . Tubal ligation    . Carpal tunnel release  2015    left    Current Outpatient Rx  Name  Route  Sig  Dispense  Refill  . cetirizine  (ZYRTEC) 10 MG tablet   Oral   Take 10 mg by mouth daily.         . diphenhydrAMINE (BENADRYL) 25 MG tablet   Oral   Take 25 mg by mouth at bedtime.         . divalproex (DEPAKOTE) 500 MG DR tablet   Oral   Take 500 mg by mouth 2 (two) times daily.          Marland Kitchen FLUoxetine (PROZAC) 20 MG tablet   Oral   Take 20 mg by mouth daily.         . montelukast (SINGULAIR) 10 MG tablet   Oral   Take 10 mg by mouth at bedtime.         . Multiple Vitamin (MULTIVITAMIN) tablet   Oral   Take 1 tablet by mouth daily.         . nabumetone (RELAFEN) 750 MG tablet   Oral   Take 1 tablet (750 mg total) by mouth 2 (two) times daily as needed. Take with food; do NOT take with other NSAIDs   40 tablet   0   . ondansetron (ZOFRAN-ODT) 4 MG disintegrating tablet   Oral   Take 1 tablet (4 mg total) by mouth every 8 (eight) hours as needed for nausea or vomiting.   20 tablet   0   . oxyCODONE-acetaminophen (ROXICET) 5-325 MG tablet   Oral   Take 1-2 tablets by mouth every 4 (  four) hours as needed for severe pain. Patient not taking: Reported on 08/12/2015   15 tablet   0   . traZODone (DESYREL) 50 MG tablet      50-100 mg at bedtime.         . VENTOLIN HFA 108 (90 BASE) MCG/ACT inhaler   Inhalation   Inhale 2 puffs into the lungs every 4 (four) hours as needed for wheezing or shortness of breath.   1 Inhaler   1     Dispense as written.   . vitamin B-12 (CYANOCOBALAMIN) 1000 MCG tablet   Oral   Take 1,000 mcg by mouth daily.         . vitamin C (ASCORBIC ACID) 500 MG tablet   Oral   Take 500 mg by mouth daily.           Allergies Robitussin (alcohol free); Aspirin; Tramadol; and Codeine  Family History  Problem Relation Age of Onset  . Asthma Mother   . Heart disease Mother   . Diabetes Mother   . Hyperlipidemia Mother   . Hypertension Mother   . Mental illness Mother   . Migraines Mother   . Thyroid disease Mother   . Stroke Mother   . Hypertension  Sister   . Cancer Maternal Grandmother   . Diabetes Maternal Grandmother   . Heart disease Maternal Grandmother   . Hyperlipidemia Maternal Grandmother   . Hypertension Maternal Grandmother   . Kidney disease Maternal Grandmother   . Heart disease Paternal Grandfather     Social History Social History  Substance Use Topics  . Smoking status: Current Every Day Smoker -- 1.00 packs/day    Types: Cigarettes  . Smokeless tobacco: Never Used  . Alcohol Use: No    Review of Systems Constitutional: No fever/chills.  No lightheadedness or syncope. Eyes: No visual changes. ENT: No sore throat. No congestion or rhinorrhea. Cardiovascular: Denies chest pain. Denies palpitations. Respiratory: Denies shortness of breath.  No cough. Gastrointestinal: No abdominal pain.  No nausea, no vomiting.  No diarrhea.  No constipation. Genitourinary: Negative for dysuria. Musculoskeletal: Negative for back pain. Skin: Negative for rash. Neurological: Negative for headaches. No focal numbness, tingling or weakness. Positive pseudoseizure that is not described.  10-point ROS otherwise negative.  ____________________________________________   PHYSICAL EXAM:  VITAL SIGNS: ED Triage Vitals  Enc Vitals Group     BP 09/25/15 1857 124/86 mmHg     Pulse Rate 09/25/15 1857 84     Resp 09/25/15 1857 20     Temp 09/25/15 1857 98.4 F (36.9 C)     Temp src --      SpO2 09/25/15 1857 96 %     Weight 09/25/15 1857 173 lb (78.472 kg)     Height 09/25/15 1857 5\' 5"  (1.651 m)     Head Cir --      Peak Flow --      Pain Score --      Pain Loc --      Pain Edu? --      Excl. in GC? --     Constitutional: Patient is alert and makes good eye contact. She gets occasional verbal responses and is able to comply with my examination. She is in no acute distress.  Eyes: Conjunctivae are normal.  EOMI. No scleral icterus. PERRLA with pupils 5 mm and reactive bilaterally. Head: Atraumatic. No raccoon eyes or  Battle sign. Nose: No congestion/rhinnorhea. Mouth/Throat: Mucous membranes are moist.  Neck: No  stridor.  Supple.  No JVD. No meningismus. Cardiovascular: Normal rate, regular rhythm. No murmurs, rubs or gallops.  Respiratory: Normal respiratory effort.  No accessory muscle use or retractions. Lungs CTAB.  No wheezes, rales or ronchi. Gastrointestinal: Soft, nontender and nondistended.  No guarding or rebound.  No peritoneal signs. Musculoskeletal: No LE edema. No ttp in the calves or palpable cords.  Negative Homan's sign. Neurologic:  Alert. Protecting her airway.Marland Kitchen  Speech is clear.  Face is symmetric.  EOMI.  Moves all extremities well. Skin:  Skin is warm, dry and intact. No rash noted. Psychiatric: Abnormal mood and affect but it is difficult to further assess the patient's psychiatric condition because she will not answer questions.  ____________________________________________   LABS (all labs ordered are listed, but only abnormal results are displayed)  Labs Reviewed  BASIC METABOLIC PANEL - Abnormal; Notable for the following:    Potassium 3.3 (*)    BUN <5 (*)    All other components within normal limits  URINALYSIS COMPLETEWITH MICROSCOPIC ([facility] ONLY) - Abnormal; Notable for the following:    Color, Urine YELLOW (*)    APPearance CLEAR (*)    Ketones, ur TRACE (*)    Bacteria, UA RARE (*)    Squamous Epithelial / LPF 0-5 (*)    All other components within normal limits  CBC  POC URINE PREG, ED  POCT PREGNANCY, URINE   ____________________________________________  EKG  Not indicated ____________________________________________  RADIOLOGY  No results found.  ____________________________________________   PROCEDURES  Procedure(s) performed: None  Critical Care performed: No ____________________________________________   INITIAL IMPRESSION / ASSESSMENT AND PLAN / ED COURSE  Pertinent labs & imaging results that were available during my care of the  patient were reviewed by me and considered in my medical decision making (see chart for details).  30 y.o. female with a history of bipolar disorder and pseudoseizures presenting for an episode of likely pseudoseizure. Unfortunately, there is no significant description of the event that brought her here in the first place and she is unable to give me any information. I have suggested that I would give her some fluids, and do some basic laboratory studies and when I returned she'll be able to give me more information.  ----------------------------------------- 8:19 PM on 09/25/2015 -----------------------------------------  Patient continues to be hemodynamically stable and her laboratory studies are all reassuring. She states that she is feeling better, she is now conversing and looking at video is with her boyfriend on her cell phone, so we will plan to discharge her home. Since return precautions as well as follow-up instructions. ____________________________________________  FINAL CLINICAL IMPRESSION(S) / ED DIAGNOSES  Final diagnoses:  Pseudoseizure (HCC)      NEW MEDICATIONS STARTED DURING THIS VISIT:  New Prescriptions   No medications on file     Rockne Menghini, MD 09/25/15 2020

## 2015-09-25 NOTE — Discharge Instructions (Signed)
Please return to the emergency department for seizures, fainting, fever or any other symptoms concerning to you. Nonepileptic Seizures Nonepileptic seizures are seizures that are not caused by abnormal electrical signals in your brain. These seizures often seem like epileptic seizures, but they are not caused by epilepsy.  There are two types of nonepileptic seizures:  A physiologic nonepileptic seizure results from a disruption in your brain.  A psychogenic seizure results from emotional stress. These seizures are sometimes called pseudoseizures. CAUSES  Causes of physiologic nonepileptic seizures include:   Sudden drop in blood pressure.  Low blood sugar.  Low levels of salt (sodium) in your blood.  Low levels of calcium in your blood.  Migraine.  Heart rhythm problems.  Sleep disorders.  Drug and alcohol abuse. Common causes of psychogenic nonepileptic seizures include:  Stress.  Emotional trauma.  Sexual or physical abuse.  Major life events, such as divorce or the death of a loved one.  Mental health disorders, including panic attack and hyperactivity disorder. SIGNS AND SYMPTOMS A nonepileptic seizure can look like an epileptic seizure, including uncontrollable shaking (convulsions), or changes in attention, behavior, or the ability to remain awake and alert. However, there are some differences. Nonepileptic seizures usually:  Do not cause physical injuries.  Start slowly.  Include crying or shrieking.  Last longer than 2 minutes.  Have a short recovery time without headache or exhaustion. DIAGNOSIS  Your health care provider can usually diagnose nonepileptic seizures after taking your medical history and giving you a physical exam. Your health care provider may want to talk to your friends or relatives who have seen you have a seizure.  You may also need to have tests to look for causes of physiologic nonepileptic seizures. This may include an  electroencephalogram (EEG), which is a test that measures electrical activity in your brain. If you have had an epileptic seizure, the results of your EEG will be abnormal. If your health care provider thinks you have had a psychogenic nonepileptic seizure, you may need to see a mental health specialist for an evaluation. TREATMENT  Treatment depends on the type and cause of your seizures.  For physiologic nonepileptic seizures, treatment is aimed at addressing the underlying condition that caused the seizures. These seizures usually stop when the underlying condition is properly treated.  Nonepileptic seizures do not respond to the seizure medicines used to treat epilepsy.  For psychogenic seizures, you may need to work with a mental health specialist. HOME CARE INSTRUCTIONS Home care will depend on the type of nonepileptic seizures you have.   Follow all your health care provider's instructions.  Keep all your follow-up appointments. SEEK MEDICAL CARE IF: You continue to have seizures after treatment. SEEK IMMEDIATE MEDICAL CARE IF:  Your seizures change or become more frequent.  You injure yourself during a seizure.  You have one seizure after another.  You have trouble recovering from a seizure.  You have chest pain or trouble breathing. MAKE SURE YOU:  Understand these instructions.  Will watch your condition.  Will get help right away if you are not doing well or get worse.   This information is not intended to replace advice given to you by your health care provider. Make sure you discuss any questions you have with your health care provider.   Document Released: 07/27/2005 Document Revised: 07/02/2014 Document Reviewed: 04/07/2013 Elsevier Interactive Patient Education [employer]2016 [employer]

## 2015-10-11 ENCOUNTER — Emergency Department
Admission: EM | Admit: 2015-10-11 | Discharge: 2015-10-11 | Disposition: A | Discharge status: Home / Self Care | Payer: Managed Care, Other (non HMO) | Attending: Emergency Medicine | Admitting: Emergency Medicine

## 2015-10-11 ENCOUNTER — Emergency Department: Payer: Managed Care, Other (non HMO)

## 2015-10-11 DIAGNOSIS — S91131A Puncture wound without foreign body of right great toe without damage to nail, initial encounter: Secondary | ICD-10-CM | POA: Insufficient documentation

## 2015-10-11 DIAGNOSIS — W450XXA Nail entering through skin, initial encounter: Secondary | ICD-10-CM | POA: Insufficient documentation

## 2015-10-11 DIAGNOSIS — F319 Bipolar disorder, unspecified: Secondary | ICD-10-CM | POA: Insufficient documentation

## 2015-10-11 DIAGNOSIS — F445 Conversion disorder with seizures or convulsions: Secondary | ICD-10-CM | POA: Diagnosis not present

## 2015-10-11 DIAGNOSIS — E785 Hyperlipidemia, unspecified: Secondary | ICD-10-CM | POA: Insufficient documentation

## 2015-10-11 DIAGNOSIS — Y990 Civilian activity done for income or pay: Secondary | ICD-10-CM | POA: Diagnosis not present

## 2015-10-11 DIAGNOSIS — J45909 Unspecified asthma, uncomplicated: Secondary | ICD-10-CM | POA: Insufficient documentation

## 2015-10-11 DIAGNOSIS — Y9389 Activity, other specified: Secondary | ICD-10-CM | POA: Insufficient documentation

## 2015-10-11 DIAGNOSIS — Y9289 Other specified places as the place of occurrence of the external cause: Secondary | ICD-10-CM | POA: Insufficient documentation

## 2015-10-11 DIAGNOSIS — F17210 Nicotine dependence, cigarettes, uncomplicated: Secondary | ICD-10-CM | POA: Diagnosis not present

## 2015-10-11 DIAGNOSIS — Z79899 Other long term (current) drug therapy: Secondary | ICD-10-CM | POA: Diagnosis not present

## 2015-10-11 DIAGNOSIS — S91139A Puncture wound without foreign body of unspecified toe(s) without damage to nail, initial encounter: Secondary | ICD-10-CM

## 2015-10-11 MED ORDER — HYDROCODONE-ACETAMINOPHEN 5-325 MG PO TABS
2.0000 | ORAL_TABLET | Freq: Once | ORAL | Status: AC
  Administered 2015-10-11: 2 via ORAL
  Filled 2015-10-11: qty 2

## 2015-10-11 MED ORDER — IBUPROFEN 800 MG PO TABS: 800.0000 mg | ORAL_TABLET | Freq: Three times a day (TID) | ORAL | Status: DC | PRN

## 2015-10-11 MED ORDER — CIPROFLOXACIN HCL 500 MG PO TABS: 500.0000 mg | ORAL_TABLET | Freq: Two times a day (BID) | ORAL | Status: AC

## 2015-10-11 NOTE — ED Provider Notes (Signed)
[facility] Emergency Department Provider Note  ____________________________________________  Time seen: Approximately 4:38 PM  I have reviewed the triage vital signs and the nursing notes.   HISTORY  Chief Complaint Puncture Wound    HPI [name] is a 30 y.o. female presents for evaluation of right great toe pain. Patient states that her foot slipped under a piece of a coronary work causing a nail to puncture her shoe into the medial aspect of her toe. Complains of right medial toe pain.Has not taken any medication over-the-counter describes her pain as a throbbing sensation around them a sharp pain. Kind of an achy feeling. Last tetanus given less than one year ago.   Past Medical History  Diagnosis Date  . Bipolar 1 disorder (HCC)   . Scoliosis   . Asthma   . GERD (gastroesophageal reflux disease)   . Hyperlipidemia   . Bipolar disorder (HCC)   . Scoliosis   . Allergy   . Ectopic pregnancy   . MRSA (methicillin resistant Staphylococcus aureus)   . Migraine   . History of concussion 2016  . Pseudoseizures     Patient Active Problem List   Diagnosis Date Noted  . Need for diphtheria-tetanus-pertussis (Tdap) vaccine, adult/adolescent 04/05/2015  . Abnormal weight gain 03/12/2015  . Encounter for medication monitoring 03/12/2015  . Abdominal pain, acute, right lower quadrant 03/02/2015  . Bipolar affective disorder (HCC) 01/26/2015  . Convulsion (HCC) 01/25/2015  . Seizures (HCC) 01/12/2015  . Asthma   . Hyperlipidemia   . Bipolar disorder (HCC)   . Allergy   . MRSA (methicillin resistant Staphylococcus aureus)   . Migraine headache with aura     Past Surgical History  Procedure Laterality Date  . Hernia repair  2010  . Tubal ligation    . Carpal tunnel release  2015    left    Current Outpatient Rx  Name  Route  Sig  Dispense  Refill  . cetirizine (ZYRTEC) 10 MG tablet   Oral   Take 10 mg by mouth daily.         .  ciprofloxacin (CIPRO) 500 MG tablet   Oral   Take 1 tablet (500 mg total) by mouth 2 (two) times daily.   20 tablet   0   . divalproex (DEPAKOTE) 500 MG DR tablet   Oral   Take 500 mg by mouth 2 (two) times daily.          Marland Kitchen FLUoxetine (PROZAC) 20 MG tablet   Oral   Take 20 mg by mouth daily.         Marland Kitchen ibuprofen (ADVIL,MOTRIN) 800 MG tablet   Oral   Take 1 tablet (800 mg total) by mouth every 8 (eight) hours as needed.   30 tablet   0   . montelukast (SINGULAIR) 10 MG tablet   Oral   Take 10 mg by mouth at bedtime.         . Multiple Vitamin (MULTIVITAMIN) tablet   Oral   Take 1 tablet by mouth daily.         . traZODone (DESYREL) 50 MG tablet      50-100 mg at bedtime.         . VENTOLIN HFA 108 (90 BASE) MCG/ACT inhaler   Inhalation   Inhale 2 puffs into the lungs every 4 (four) hours as needed for wheezing or shortness of breath.   1 Inhaler   1     Dispense  as written.   . vitamin B-12 (CYANOCOBALAMIN) 1000 MCG tablet   Oral   Take 1,000 mcg by mouth daily.         . vitamin C (ASCORBIC ACID) 500 MG tablet   Oral   Take 500 mg by mouth daily.           Allergies Robitussin (alcohol free); Aspirin; Tramadol; and Codeine  Family History  Problem Relation Age of Onset  . Asthma Mother   . Heart disease Mother   . Diabetes Mother   . Hyperlipidemia Mother   . Hypertension Mother   . Mental illness Mother   . Migraines Mother   . Thyroid disease Mother   . Stroke Mother   . Hypertension Sister   . Cancer Maternal Grandmother   . Diabetes Maternal Grandmother   . Heart disease Maternal Grandmother   . Hyperlipidemia Maternal Grandmother   . Hypertension Maternal Grandmother   . Kidney disease Maternal Grandmother   . Heart disease Paternal Grandfather     Social History Social History  Substance Use Topics  . Smoking status: Current Every Day Smoker -- 1.00 packs/day    Types: Cigarettes  . Smokeless tobacco: Never Used  .  Alcohol Use: No    Review of Systems Constitutional: No fever/chills Musculoskeletal: Positive for right medial great toe pain. No shoulder and right great toe. Skin: Negative for rash. Neurological: Negative for headaches, focal weakness or numbness.  10-point ROS otherwise negative.  ____________________________________________   PHYSICAL EXAM:  VITAL SIGNS: ED Triage Vitals  Enc Vitals Group     BP 10/11/15 1539 118/70 mmHg     Pulse Rate 10/11/15 1539 99     Resp 10/11/15 1539 18     Temp 10/11/15 1539 98.6 F (37 C)     Temp Source 10/11/15 1539 Oral     SpO2 10/11/15 1539 96 %     Weight 10/11/15 1539 166 lb (75.297 kg)     Height 10/11/15 1539 5\' 5"  (1.651 m)     Head Cir --      Peak Flow --      Pain Score 10/11/15 1540 7     Pain Loc --      Pain Edu? --      Excl. in GC? --     Constitutional: Alert and oriented. Well appearing and in no acute distress. Musculoskeletal: Medial aspect of right great toe with puncture wound visible with no evidence of drainage or erythema. Neurologic:  Distally neurovascularly intact. Skin:  Skin is warm, dry and intact. No rash noted. Negative for erythema. Psychiatric: Mood and affect are normal. Speech and behavior are normal.  ____________________________________________   LABS (all labs ordered are listed, but only abnormal results are displayed)  Labs Reviewed - No data to display ____________________________________________   RADIOLOGY  No visible evidence of foreign body. Wound cleaned. ____________________________________________   PROCEDURES  Procedure(s) performed: None  Critical Care performed: No  ____________________________________________   INITIAL IMPRESSION / ASSESSMENT AND PLAN / ED COURSE  Pertinent labs & imaging results that were available during my care of the patient were reviewed by me and considered in my medical decision making (see chart for details).  No acute osseous  findings no evidence of foreign body. We'll start patient on Cipro 500 mg twice a day for 10 days. Patient follow-up PCP or return to ER for any worsening symptomology. She voices no other emergency medical complaints at this time. ____________________________________________   FINAL  CLINICAL IMPRESSION(S) / ED DIAGNOSES  Final diagnoses:  Puncture wound of toe of right foot, initial encounter     This chart was dictated using voice recognition software/Dragon. Despite best efforts to proofread, errors can occur which can change the meaning. Any change was purely unintentional.   Evangeline Dakin, PA-C 10/11/15 1700  Governor Rooks, MD 10/11/15 2121

## 2015-10-11 NOTE — Discharge Instructions (Signed)
Puncture Wound A puncture wound is an injury that is caused by a sharp, thin object that goes through (penetrates) your skin. Usually, a puncture wound does not leave a large opening in your skin, so it may not bleed a lot. However, when you get a puncture wound, dirt or other materials (foreign bodies) can be forced into your wound and break off inside. This increases the chance of infection, such as tetanus. CAUSES Puncture wounds are caused by any sharp, thin object that goes through your skin, such as:  Animal teeth, as with an animal bite.  Sharp, pointed objects, such as nails, splinters of glass, fishhooks, and needles. SYMPTOMS Symptoms of a puncture wound include:  Pain.  Bleeding.  Swelling.  Bruising.  Fluid leaking from the wound.  Numbness, tingling, or loss of function. DIAGNOSIS This condition is diagnosed with a medical history and physical exam. Your wound will be checked to see if it contains any foreign bodies. You may also have X-rays or other imaging tests. TREATMENT Treatment for a puncture wound depends on how serious the wound is. It also depends on whether the wound contains any foreign bodies. Treatment for all types of puncture wounds usually starts with:  Controlling the bleeding.  Washing out the wound with a germ-free (sterile) salt-water solution.  Checking the wound for foreign bodies. Treatment may also include:  Having the wound opened surgically to remove a foreign object.  Closing the wound with stitches (sutures) if it continues to bleed.  Covering the wound with antibiotic ointments and a bandage (dressing).  Receiving a tetanus shot.  Receiving a rabies vaccine. HOME CARE INSTRUCTIONS Medicines  Take or apply over-the-counter and prescription medicines only as told by your health care provider.  If you were prescribed an antibiotic, take or apply it as told by your health care provider. Do not stop using the antibiotic even if  your condition improves. Wound Care  There are many ways to close and cover a wound. For example, a wound can be covered with sutures, skin glue, or adhesive strips. Follow instructions from your health care provider about:  How to take care of your wound.  When and how you should change your dressing.  When you should remove your dressing.  Removing whatever was used to close your wound.  Keep the dressing dry as told by your health care provider. Do not take baths, swim, use a hot tub, or do anything that would put your wound underwater until your health care provider approves.  Clean the wound as told by your health care provider.  Do not scratch or pick at the wound.  Check your wound every day for signs of infection. Watch for:  Redness, swelling, or pain.  Fluid, blood, or pus. General Instructions  Raise (elevate) the injured area above the level of your heart while you are sitting or lying down.  If your puncture wound is in your foot, ask your health care provider if you need to avoid putting weight on your foot and for how long.  Keep all follow-up visits as told by your health care provider. This is important. SEEK MEDICAL CARE IF:  You received a tetanus shot and you have swelling, severe pain, redness, or bleeding at the injection site.  You have a fever.  Your sutures come out.  You notice a bad smell coming from your wound or your dressing.  You notice something coming out of your wound, such as wood or glass.  Your   pain is not controlled with medicine.  You have increased redness, swelling, or pain at the site of your wound.  You have fluid, blood, or pus coming from your wound.  You notice a change in the color of your skin near your wound.  You need to change the dressing frequently due to fluid, blood, or pus draining from your wound.  You develop a new rash.  You develop numbness around your wound. SEEK IMMEDIATE MEDICAL CARE IF:  You  develop severe swelling around your wound.  Your pain suddenly increases and is severe.  You develop painful skin lumps.  You have a red streak going away from your wound.  The wound is on your hand or foot and you cannot properly move a finger or toe.  The wound is on your hand or foot and you notice that your fingers or toes look pale or bluish.   This information is not intended to replace advice given to you by your health care provider. Make sure you discuss any questions you have with your health care provider.   Document Released: 03/21/2005 Document Revised: 03/02/2015 Document Reviewed: 08/04/2014 Elsevier Interactive Patient Education 2016 [employer]  

## 2015-10-11 NOTE — ED Notes (Signed)
Pt states she was at work and a nail went through her shoe and into the right medial foot..Marland Kitchen

## 2015-10-11 NOTE — ED Notes (Signed)
See triage note   States she had a nail go thur her shoe at work. Puncture wound noted to right medial foot

## 2016-01-08 ENCOUNTER — Emergency Department
Admission: EM | Admit: 2016-01-08 | Discharge: 2016-01-08 | Disposition: A | Discharge status: Home / Self Care | Payer: Managed Care, Other (non HMO) | Attending: Emergency Medicine | Admitting: Emergency Medicine

## 2016-01-08 ENCOUNTER — Encounter: Payer: Self-pay | Admitting: Emergency Medicine

## 2016-01-08 DIAGNOSIS — Z8669 Personal history of other diseases of the nervous system and sense organs: Secondary | ICD-10-CM | POA: Diagnosis not present

## 2016-01-08 DIAGNOSIS — E785 Hyperlipidemia, unspecified: Secondary | ICD-10-CM | POA: Insufficient documentation

## 2016-01-08 DIAGNOSIS — N390 Urinary tract infection, site not specified: Secondary | ICD-10-CM

## 2016-01-08 DIAGNOSIS — R300 Dysuria: Secondary | ICD-10-CM | POA: Diagnosis present

## 2016-01-08 DIAGNOSIS — J45909 Unspecified asthma, uncomplicated: Secondary | ICD-10-CM | POA: Diagnosis not present

## 2016-01-08 DIAGNOSIS — F17210 Nicotine dependence, cigarettes, uncomplicated: Secondary | ICD-10-CM | POA: Insufficient documentation

## 2016-01-08 DIAGNOSIS — F319 Bipolar disorder, unspecified: Secondary | ICD-10-CM | POA: Diagnosis not present

## 2016-01-08 LAB — URINALYSIS COMPLETE WITH MICROSCOPIC (ARMC ONLY)
Bilirubin Urine: NEGATIVE
Glucose, UA: NEGATIVE mg/dL
Ketones, ur: NEGATIVE mg/dL
Nitrite: NEGATIVE
PROTEIN: 100 mg/dL — AB
SPECIFIC GRAVITY, URINE: 1.011 (ref 1.005–1.030)
Trans Epithel, UA: 1
pH: 7 (ref 5.0–8.0)

## 2016-01-08 LAB — BASIC METABOLIC PANEL
Anion gap: 9 (ref 5–15)
BUN: 14 mg/dL (ref 6–20)
CHLORIDE: 106 mmol/L (ref 101–111)
CO2: 25 mmol/L (ref 22–32)
CREATININE: 0.66 mg/dL (ref 0.44–1.00)
Calcium: 9.3 mg/dL (ref 8.9–10.3)
GFR calc non Af Amer: >60 mL/min (ref >60)
Glucose, Bld: 130 mg/dL — ABNORMAL HIGH (ref 65–99)
POTASSIUM: 3.9 mmol/L (ref 3.5–5.1)
Sodium: 140 mmol/L (ref 135–145)

## 2016-01-08 LAB — CBC
HEMATOCRIT: 38.4 % (ref 35.0–47.0)
HEMOGLOBIN: 13.1 g/dL (ref 12.0–16.0)
MCH: 30.4 pg (ref 26.0–34.0)
MCHC: 34.2 g/dL (ref 32.0–36.0)
MCV: 88.9 fL (ref 80.0–100.0)
Platelets: 240 10*3/uL (ref 150–440)
RBC: 4.31 MIL/uL (ref 3.80–5.20)
RDW: 12.8 % (ref 11.5–14.5)
WBC: 11.3 10*3/uL — AB (ref 3.6–11.0)

## 2016-01-08 LAB — POCT PREGNANCY, URINE: Preg Test, Ur: NEGATIVE

## 2016-01-08 MED ORDER — CEPHALEXIN 500 MG PO CAPS
500.0000 mg | ORAL_CAPSULE | Freq: Four times a day (QID) | ORAL | Status: DC
  Administered 2016-01-08: 500 mg via ORAL
  Filled 2016-01-08: qty 1

## 2016-01-08 MED ORDER — ONDANSETRON 4 MG PO TBDP: 4.0000 mg | ORAL_TABLET | Freq: Three times a day (TID) | ORAL | Status: DC | PRN

## 2016-01-08 MED ORDER — FLUCONAZOLE 150 MG PO TABS: ORAL_TABLET | ORAL | Status: DC

## 2016-01-08 MED ORDER — CEPHALEXIN 500 MG PO CAPS: 500.0000 mg | ORAL_CAPSULE | Freq: Four times a day (QID) | ORAL | Status: DC

## 2016-01-08 MED ORDER — ONDANSETRON 4 MG PO TBDP
4.0000 mg | ORAL_TABLET | Freq: Once | ORAL | Status: AC
  Administered 2016-01-08: 4 mg via ORAL
  Filled 2016-01-08: qty 1

## 2016-01-08 NOTE — Discharge Instructions (Signed)
Increase fluids. Take antibiotics as directed 4 times a day until completely finished. Take Zofran if needed for nausea. Follow-up with your doctor for recheck of your urine after finishing the antibiotic. Return to the emergency room if any severe worsening of her symptoms.

## 2016-01-08 NOTE — ED Notes (Signed)
Says urinary sx for several day and was going to wait to see pcp when she got her check, but it is getting worse.  Has blood when she wipes now.  Urgency and her left lower back hurts and is now in mid low back.

## 2016-01-08 NOTE — ED Notes (Signed)
Patient states she has been having low back pain on the left side radiating to the right side. Pt also states when she urinates there is pain and after she is done and wipes blood is present. Pt states she has had UTIs in the past but never this bad.

## 2016-01-08 NOTE — ED Provider Notes (Signed)
[facility] Emergency Department Provider Note  ____________________________________________  Time seen: Approximately 1:20 PM  I have reviewed the triage vital signs and the nursing notes.   HISTORY  Chief Complaint Urinary Frequency; Dysuria; and Back Pain   HPI [name] is a 30 y.o. female is here with complaint of dysuria, urgency with low back pain and also saw some blood while she was wiping. She states that she has had urinary tract infections in the past but this is first time she has ever seen blood. She currently has some nausea but no vomiting. She states that yesterday there was some vomiting. She is unaware of anything fever or chills. Her last urinary tract infection was approximately 4 years ago. She denies any vaginal discharge. She does have somelow back pain near her sacrum but denies any flank pain. She also denies any history of kidney stones. Currently she rates her pain as an 8/10.   Past Medical History  Diagnosis Date  . Bipolar 1 disorder (HCC)   . Scoliosis   . Asthma   . GERD (gastroesophageal reflux disease)   . Hyperlipidemia   . Bipolar disorder (HCC)   . Scoliosis   . Allergy   . Ectopic pregnancy   . MRSA (methicillin resistant Staphylococcus aureus)   . Migraine   . History of concussion 2016  . Pseudoseizures     Patient Active Problem List   Diagnosis Date Noted  . Need for diphtheria-tetanus-pertussis (Tdap) vaccine, adult/adolescent 04/05/2015  . Abnormal weight gain 03/12/2015  . Encounter for medication monitoring 03/12/2015  . Abdominal pain, acute, right lower quadrant 03/02/2015  . Bipolar affective disorder (HCC) 01/26/2015  . Convulsion (HCC) 01/25/2015  . Seizures (HCC) 01/12/2015  . Asthma   . Hyperlipidemia   . Bipolar disorder (HCC)   . Allergy   . MRSA (methicillin resistant Staphylococcus aureus)   . Migraine headache with aura     Past Surgical History  Procedure Laterality Date   . Hernia repair  2010  . Tubal ligation    . Carpal tunnel release  2015    left    Current Outpatient Rx  Name  Route  Sig  Dispense  Refill  . cephALEXin (KEFLEX) 500 MG capsule   Oral   Take 1 capsule (500 mg total) by mouth 4 (four) times daily.   40 capsule   0   . cetirizine (ZYRTEC) 10 MG tablet   Oral   Take 10 mg by mouth daily.         . divalproex (DEPAKOTE) 500 MG DR tablet   Oral   Take 500 mg by mouth 2 (two) times daily.          . fluconazole (DIFLUCAN) 150 MG tablet      1 tablet po if needed for yeast infection   1 tablet   0   . FLUoxetine (PROZAC) 20 MG tablet   Oral   Take 20 mg by mouth daily.         Marland Kitchen ibuprofen (ADVIL,MOTRIN) 800 MG tablet   Oral   Take 1 tablet (800 mg total) by mouth every 8 (eight) hours as needed.   30 tablet   0   . montelukast (SINGULAIR) 10 MG tablet   Oral   Take 10 mg by mouth at bedtime.         . Multiple Vitamin (MULTIVITAMIN) tablet   Oral   Take 1 tablet by mouth daily.         Marland Kitchen  ondansetron (ZOFRAN ODT) 4 MG disintegrating tablet   Oral   Take 1 tablet (4 mg total) by mouth every 8 (eight) hours as needed for nausea or vomiting.   20 tablet   0   . traZODone (DESYREL) 50 MG tablet      50-100 mg at bedtime.         . VENTOLIN HFA 108 (90 BASE) MCG/ACT inhaler   Inhalation   Inhale 2 puffs into the lungs every 4 (four) hours as needed for wheezing or shortness of breath.   1 Inhaler   1     Dispense as written.   . vitamin B-12 (CYANOCOBALAMIN) 1000 MCG tablet   Oral   Take 1,000 mcg by mouth daily.         . vitamin C (ASCORBIC ACID) 500 MG tablet   Oral   Take 500 mg by mouth daily.           Allergies Robitussin (alcohol free); Aspirin; Tramadol; and Codeine  Family History  Problem Relation Age of Onset  . Asthma Mother   . Heart disease Mother   . Diabetes Mother   . Hyperlipidemia Mother   . Hypertension Mother   . Mental illness Mother   . Migraines  Mother   . Thyroid disease Mother   . Stroke Mother   . Hypertension Sister   . Cancer Maternal Grandmother   . Diabetes Maternal Grandmother   . Heart disease Maternal Grandmother   . Hyperlipidemia Maternal Grandmother   . Hypertension Maternal Grandmother   . Kidney disease Maternal Grandmother   . Heart disease Paternal Grandfather     Social History Social History  Substance Use Topics  . Smoking status: Current Every Day Smoker -- 1.00 packs/day    Types: Cigarettes  . Smokeless tobacco: Never Used  . Alcohol Use: No    Review of Systems Constitutional: No fever/chills Cardiovascular: Denies chest pain. Respiratory: Denies shortness of breath. Gastrointestinal: No abdominal pain. Positive nausea, positive and resolved vomiting.   Genitourinary: Positive for dysuria. Musculoskeletal: Positive for low back pain. Skin: Negative for rash. Neurological: Negative for headaches, focal weakness or numbness.  10-point ROS otherwise negative.  ____________________________________________   PHYSICAL EXAM:  VITAL SIGNS: ED Triage Vitals  Enc Vitals Group     BP 01/08/16 1138 122/78 mmHg     Pulse Rate 01/08/16 1138 117     Resp 01/08/16 1138 14     Temp 01/08/16 1138 98.5 F (36.9 C)     Temp Source 01/08/16 1138 Oral     SpO2 --      Weight 01/08/16 1138 181 lb (82.101 kg)     Height 01/08/16 1138 5\' 5"  (1.651 m)     Head Cir --      Peak Flow --      Pain Score 01/08/16 1138 8     Pain Loc --      Pain Edu? --      Excl. in GC? --     Constitutional: Alert and oriented. Well appearing and in no acute distress. Eyes: Conjunctivae are normal. PERRL. EOMI. Head: Atraumatic. Nose: No congestion/rhinnorhea. Neck: No stridor.   Cardiovascular: Normal rate, regular rhythm. Grossly normal heart sounds.  Good peripheral circulation. Respiratory: Normal respiratory effort.  No retractions. Lungs CTAB. Gastrointestinal: Soft and nontender. No distention. Bowel  sounds normoactive 4 quadrants. Musculoskeletal: Moves upper and lower extremities without any difficulty. Normal gait was noted. Neurologic:  Normal speech and language. No  gross focal neurologic deficits are appreciated. No gait instability. Skin:  Skin is warm, dry and intact. No rash noted. Psychiatric: Mood and affect are normal. Speech and behavior are normal.  ____________________________________________   LABS (all labs ordered are listed, but only abnormal results are displayed)  Labs Reviewed  URINALYSIS COMPLETEWITH MICROSCOPIC ([facility] ONLY) - Abnormal; Notable for the following:    Color, Urine YELLOW (*)    APPearance CLOUDY (*)    Hgb urine dipstick 2+ (*)    Protein, ur 100 (*)    Leukocytes, UA 3+ (*)    Bacteria, UA RARE (*)    Squamous Epithelial / LPF 0-5 (*)    All other components within normal limits  CBC - Abnormal; Notable for the following:    WBC 11.3 (*)    All other components within normal limits  BASIC METABOLIC PANEL - Abnormal; Notable for the following:    Glucose, Bld 130 (*)    All other components within normal limits  URINE CULTURE  POC URINE PREG, ED  POCT PREGNANCY, URINE     PROCEDURES  Procedure(s) performed: None  Procedures  Critical Care performed: No  ____________________________________________   INITIAL IMPRESSION / ASSESSMENT AND PLAN / ED COURSE  Pertinent labs & imaging results that were available during my care of the patient were reviewed by me and considered in my medical decision making (see chart for details).  Patient was given Zofran while in the emergency room for nausea however she has not had any vomiting today at all. Patient will be taken Keflex 500 mg 4 times a day for 10 days, Zofran as needed for nausea and Diflucan if needed for yeast infection. Patient will follow up with her primary care doctor after finishing the antibiotic for recheck of her urine. Also culture of her urine was ordered  today. ____________________________________________   FINAL CLINICAL IMPRESSION(S) / ED DIAGNOSES  Final diagnoses:  Acute urinary tract infection      NEW MEDICATIONS STARTED DURING THIS VISIT:  New Prescriptions   CEPHALEXIN (KEFLEX) 500 MG CAPSULE    Take 1 capsule (500 mg total) by mouth 4 (four) times daily.   FLUCONAZOLE (DIFLUCAN) 150 MG TABLET    1 tablet po if needed for yeast infection   ONDANSETRON (ZOFRAN ODT) 4 MG DISINTEGRATING TABLET    Take 1 tablet (4 mg total) by mouth every 8 (eight) hours as needed for nausea or vomiting.     Note:  This document was prepared using Dragon voice recognition software and may include unintentional dictation errors.    Tommi RumpsRhonda L Summers, PA-C 01/08/16 1353  Nita Sicklearolina Veronese, MD 01/08/16 2120

## 2016-01-10 LAB — URINE CULTURE: SPECIAL REQUESTS: NORMAL

## 2016-02-22 ENCOUNTER — Emergency Department
Admission: EM | Admit: 2016-02-22 | Discharge: 2016-02-22 | Disposition: A | Discharge status: Home / Self Care | Payer: Managed Care, Other (non HMO) | Attending: Emergency Medicine | Admitting: Emergency Medicine

## 2016-02-22 ENCOUNTER — Encounter: Payer: Self-pay | Admitting: Emergency Medicine

## 2016-02-22 DIAGNOSIS — F17210 Nicotine dependence, cigarettes, uncomplicated: Secondary | ICD-10-CM | POA: Diagnosis not present

## 2016-02-22 DIAGNOSIS — B349 Viral infection, unspecified: Secondary | ICD-10-CM | POA: Insufficient documentation

## 2016-02-22 DIAGNOSIS — J45909 Unspecified asthma, uncomplicated: Secondary | ICD-10-CM | POA: Insufficient documentation

## 2016-02-22 DIAGNOSIS — R197 Diarrhea, unspecified: Secondary | ICD-10-CM

## 2016-02-22 DIAGNOSIS — K529 Noninfective gastroenteritis and colitis, unspecified: Secondary | ICD-10-CM | POA: Insufficient documentation

## 2016-02-22 DIAGNOSIS — R112 Nausea with vomiting, unspecified: Secondary | ICD-10-CM | POA: Diagnosis present

## 2016-02-22 LAB — COMPREHENSIVE METABOLIC PANEL
ALBUMIN: 4 g/dL (ref 3.5–5.0)
ALT: 26 U/L (ref 14–54)
ANION GAP: 6 (ref 5–15)
AST: 25 U/L (ref 15–41)
Alkaline Phosphatase: 51 U/L (ref 38–126)
BILIRUBIN TOTAL: 0.4 mg/dL (ref 0.3–1.2)
BUN: 12 mg/dL (ref 6–20)
CALCIUM: 9.2 mg/dL (ref 8.9–10.3)
CO2: 27 mmol/L (ref 22–32)
Chloride: 105 mmol/L (ref 101–111)
Creatinine, Ser: 0.55 mg/dL (ref 0.44–1.00)
GFR calc non Af Amer: >60 mL/min (ref >60)
GLUCOSE: 93 mg/dL (ref 65–99)
POTASSIUM: 4 mmol/L (ref 3.5–5.1)
Sodium: 138 mmol/L (ref 135–145)
TOTAL PROTEIN: 7.2 g/dL (ref 6.5–8.1)

## 2016-02-22 LAB — URINALYSIS COMPLETE WITH MICROSCOPIC (ARMC ONLY)
BACTERIA UA: NONE SEEN
Bilirubin Urine: NEGATIVE
Glucose, UA: NEGATIVE mg/dL
HGB URINE DIPSTICK: NEGATIVE
Ketones, ur: NEGATIVE mg/dL
LEUKOCYTES UA: NEGATIVE
NITRITE: NEGATIVE
PH: 6 (ref 5.0–8.0)
Protein, ur: NEGATIVE mg/dL
SPECIFIC GRAVITY, URINE: 1.011 (ref 1.005–1.030)

## 2016-02-22 LAB — LIPASE, BLOOD: Lipase: 26 U/L (ref 11–51)

## 2016-02-22 LAB — CBC
HEMATOCRIT: 37.5 % (ref 35.0–47.0)
HEMOGLOBIN: 13.4 g/dL (ref 12.0–16.0)
MCH: 31.3 pg (ref 26.0–34.0)
MCHC: 35.6 g/dL (ref 32.0–36.0)
MCV: 88 fL (ref 80.0–100.0)
Platelets: 245 10*3/uL (ref 150–440)
RBC: 4.26 MIL/uL (ref 3.80–5.20)
RDW: 12.9 % (ref 11.5–14.5)
WBC: 9.4 10*3/uL (ref 3.6–11.0)

## 2016-02-22 MED ORDER — ONDANSETRON HCL 4 MG PO TABS: 4.0000 mg | ORAL_TABLET | Freq: Three times a day (TID) | ORAL | 0 refills | Status: AC | PRN

## 2016-02-22 MED ORDER — ONDANSETRON HCL 4 MG/2ML IJ SOLN
4.0000 mg | Freq: Once | INTRAMUSCULAR | Status: AC
  Administered 2016-02-22: 4 mg via INTRAVENOUS
  Filled 2016-02-22: qty 2

## 2016-02-22 MED ORDER — SODIUM CHLORIDE 0.9 % IV BOLUS (SEPSIS)
1000.0000 mL | Freq: Once | INTRAVENOUS | Status: AC
  Administered 2016-02-22: 1000 mL via INTRAVENOUS

## 2016-02-22 NOTE — ED Notes (Signed)
Pt in via triage with complaints of cold chills since Sunday, with N/V/D starting yesterday.  Pt reports unable to keep anything down, being sent home from work today.  Pt denies any fever.  Pt A/Ox4, no immediate distress noted.

## 2016-02-22 NOTE — ED Triage Notes (Signed)
Pt presents with generalized body aches and chills with diarrhea. Pt denies any abd pain at this time.

## 2016-02-22 NOTE — ED Provider Notes (Signed)
[facility] Provider Note  ____________________________________________  Time seen: Approximately 11:50 AM  I have reviewed the triage vital signs and the nursing notes.   HISTORY  Chief Complaint Diarrhea and Generalized Body Aches    HPI [name] is a 30 y.o. female , NAD, presents to emergency with 2 day history of nausea, vomiting, diarrhea, body aches. States that she has multiple family members who have experienced similar symptoms over the last week in which she has been exposed. States that her symptoms began last night and worsened this morning while she was at work. She was sent home from work to be evaluated due to her symptoms. Has had chills and sweats but no specific fevers. Has not noted any blood in her emesis or stools. Has not been able to keep food down but has been able to sip fluids. Has not had any chest pain, shortness of breath, abdominal pain, saddle paresthesias or loss of bowel or bladder control. Denies dysuria, changes in urinary patterns, sore throat.   Past Medical History:  Diagnosis Date  . Allergy   . Asthma   . Bipolar 1 disorder (HCC)   . Bipolar disorder (HCC)   . Ectopic pregnancy   . GERD (gastroesophageal reflux disease)   . History of concussion 2016  . Hyperlipidemia   . Migraine   . MRSA (methicillin resistant Staphylococcus aureus)   . Pseudoseizures   . Scoliosis   . Scoliosis     Patient Active Problem List   Diagnosis Date Noted  . Need for diphtheria-tetanus-pertussis (Tdap) vaccine, adult/adolescent 04/05/2015  . Abnormal weight gain 03/12/2015  . Encounter for medication monitoring 03/12/2015  . Abdominal pain, acute, right lower quadrant 03/02/2015  . Bipolar affective disorder (HCC) 01/26/2015  . Convulsion (HCC) 01/25/2015  . Seizures (HCC) 01/12/2015  . Asthma   . Hyperlipidemia   . Bipolar disorder (HCC)   . Allergy   . MRSA (methicillin resistant Staphylococcus  aureus)   . Migraine headache with aura     Past Surgical History:  Procedure Laterality Date  . CARPAL TUNNEL RELEASE  2015   left  . HERNIA REPAIR  2010  . TUBAL LIGATION      Prior to Admission medications   Medication Sig Start Date End Date Taking? Authorizing Provider  cephALEXin (KEFLEX) 500 MG capsule Take 1 capsule (500 mg total) by mouth 4 (four) times daily. 01/08/16   Tommi Rumps, PA-C  cetirizine (ZYRTEC) 10 MG tablet Take 10 mg by mouth daily.    Historical Provider, MD  divalproex (DEPAKOTE) 500 MG DR tablet Take 500 mg by mouth 2 (two) times daily.     Historical Provider, MD  fluconazole (DIFLUCAN) 150 MG tablet 1 tablet po if needed for yeast infection 01/08/16   Tommi Rumps, PA-C  FLUoxetine (PROZAC) 20 MG tablet Take 20 mg by mouth daily.    Historical Provider, MD  ibuprofen (ADVIL,MOTRIN) 800 MG tablet Take 1 tablet (800 mg total) by mouth every 8 (eight) hours as needed. 10/11/15   Charmayne Sheer Beers, PA-C  montelukast (SINGULAIR) 10 MG tablet Take 10 mg by mouth at bedtime.    Historical Provider, MD  Multiple Vitamin (MULTIVITAMIN) tablet Take 1 tablet by mouth daily.    Historical Provider, MD  ondansetron (ZOFRAN ODT) 4 MG disintegrating tablet Take 1 tablet (4 mg total) by mouth every 8 (eight) hours as needed for nausea or vomiting. 01/08/16   Tommi Rumps, PA-C  ondansetron (ZOFRAN) 4 MG tablet Take 1 tablet (4 mg total) by mouth every 8 (eight) hours as needed for nausea or vomiting. 02/22/16 02/29/16  Trishia Cuthrell L Jerek Meulemans, PA-C  traZODone (DESYREL) 50 MG tablet 50-100 mg at bedtime. 07/07/15   Historical Provider, MD  VENTOLIN HFA 108 (90 BASE) MCG/ACT inhaler Inhale 2 puffs into the lungs every 4 (four) hours as needed for wheezing or shortness of breath. 04/05/15   Kerman PasseyMelinda P Lada, MD  vitamin B-12 (CYANOCOBALAMIN) 1000 MCG tablet Take 1,000 mcg by mouth daily.    Historical Provider, MD  vitamin C (ASCORBIC ACID) 500 MG tablet Take 500 mg by mouth daily.     Historical Provider, MD    Allergies Robitussin (alcohol free) [guaifenesin]; Aspirin; Tramadol; and Codeine  Family History  Problem Relation Age of Onset  . Asthma Mother   . Heart disease Mother   . Diabetes Mother   . Hyperlipidemia Mother   . Hypertension Mother   . Mental illness Mother   . Migraines Mother   . Thyroid disease Mother   . Stroke Mother   . Hypertension Sister   . Cancer Maternal Grandmother   . Diabetes Maternal Grandmother   . Heart disease Maternal Grandmother   . Hyperlipidemia Maternal Grandmother   . Hypertension Maternal Grandmother   . Kidney disease Maternal Grandmother   . Heart disease Paternal Grandfather     Social History Social History  Substance Use Topics  . Smoking status: Current Every Day Smoker    Packs/day: 1.00    Types: Cigarettes  . Smokeless tobacco: Never Used  . Alcohol use No     Review of Systems  Constitutional: Positive chills, fatigue but no fevers. ENT: No sore throat. Cardiovascular: No chest pain. Respiratory: No cough. No shortness of breath. No wheezing.  Gastrointestinal: Positive diarrhea, nausea, vomiting. No abdominal pain, constipation. Genitourinary: Negative for dysuria, hematuria. No urinary hesitancy, urgency or increased frequency. Musculoskeletal: Positive for general myalgias.  Skin: Negative for rash, redness, swelling, bruising, skin sores. Neurological: Negative for headaches, focal weakness or numbness. No tingling, loss of bowel or bladder control, saddle paresthesias 10-point ROS otherwise negative.  ____________________________________________   PHYSICAL EXAM:  VITAL SIGNS: ED Triage Vitals  Enc Vitals Group     BP 02/22/16 1019 119/76     Pulse Rate 02/22/16 1019 (!) 101     Resp 02/22/16 1019 16     Temp 02/22/16 1019 98.6 F (37 C)     Temp Source 02/22/16 1019 Oral     SpO2 02/22/16 1019 98 %     Weight 02/22/16 1019 181 lb (82.1 kg)     Height 02/22/16 1019 5\' 5"  (1.651  m)     Head Circumference --      Peak Flow --      Pain Score 02/22/16 1022 7     Pain Loc --      Pain Edu? --      Excl. in GC? --      Constitutional: Alert and oriented. Well appearing and in no acute distress. Eyes: Conjunctivae are normal without icterus or injection  Head: Atraumatic. Neck: Supple with full range of motion Hematological/Lymphatic/Immunilogical: No cervical lymphadenopathy. Cardiovascular: Normal rate, regular rhythm. Normal S1 and S2.  Good peripheral circulation. Respiratory: Normal respiratory effort without tachypnea or retractions. Lungs CTAB with breath sounds noted in all lung fields. Gastrointestinal: Soft and nontender without distention or guarding in all quadrants. No rebound or rigidity. Bowel sounds slightly hyperactive  diffusely.  Musculoskeletal: No lower extremity tenderness nor edema.  No joint effusions. Neurologic:  Normal speech and language. No gross focal neurologic deficits are appreciated. Gait and posture are normal Skin:  Skin is warm, dry and intact. No rash, redness, swelling, skin sores, petechia noted. Psychiatric: Mood and affect are normal. Speech and behavior are normal. Patient exhibits appropriate insight and judgement.   ____________________________________________   LABS (all labs ordered are listed, but only abnormal results are displayed)  Labs Reviewed  URINALYSIS COMPLETEWITH MICROSCOPIC ([facility] ONLY) - Abnormal; Notable for the following:       Result Value   Color, Urine YELLOW (*)    APPearance CLEAR (*)    Squamous Epithelial / LPF 0-5 (*)    All other components within normal limits  LIPASE, BLOOD  COMPREHENSIVE METABOLIC PANEL  CBC   ____________________________________________  EKG  None ____________________________________________  RADIOLOGY  None ____________________________________________    PROCEDURES  Procedure(s) performed: None   Procedures   Medications  sodium chloride 0.9 %  bolus 1,000 mL (0 mLs Intravenous Stopped 02/22/16 1313)  ondansetron (ZOFRAN) injection 4 mg (4 mg Intravenous Given 02/22/16 1220)     ____________________________________________   INITIAL IMPRESSION / ASSESSMENT AND PLAN / ED COURSE  Pertinent labs & imaging results that were available during my care of the patient were reviewed by me and considered in my medical decision making (see chart for details).  Clinical Course    Patient's diagnosis is consistent with Diarrhea, non-intractable vomiting with nausea due to viral gastroenteritis. Patient will be discharged home with prescriptions for Zofran to take as directed. Patient encouraged to stay hydrated with water, Gatorade. Patient is to follow up with her primary care provider or [facility] if symptoms persist past this treatment course. Patient is given ED precautions to return to the ED for any worsening or new symptoms.      ____________________________________________  FINAL CLINICAL IMPRESSION(S) / ED DIAGNOSES  Final diagnoses:  Diarrhea, unspecified type  Non-intractable vomiting with nausea, unspecified vomiting type  Viral syndrome  Gastroenteritis      NEW MEDICATIONS STARTED DURING THIS VISIT:  Discharge Medication List as of 02/22/2016  1:21 PM    START taking these medications   Details  ondansetron (ZOFRAN) 4 MG tablet Take 1 tablet (4 mg total) by mouth every 8 (eight) hours as needed for nausea or vomiting., Starting Wed 02/22/2016, Until Wed 02/29/2016, Print             Hope Pigeon, PA-C 02/22/16 1350    Nita Sickle, MD 02/23/16 1158

## 2016-03-14 ENCOUNTER — Emergency Department: Payer: Managed Care, Other (non HMO)

## 2016-03-14 ENCOUNTER — Encounter: Payer: Self-pay | Admitting: *Deleted

## 2016-03-14 ENCOUNTER — Emergency Department
Admission: EM | Admit: 2016-03-14 | Discharge: 2016-03-14 | Disposition: A | Discharge status: Home / Self Care | Payer: Managed Care, Other (non HMO) | Attending: Emergency Medicine | Admitting: Emergency Medicine

## 2016-03-14 DIAGNOSIS — Z79899 Other long term (current) drug therapy: Secondary | ICD-10-CM | POA: Diagnosis not present

## 2016-03-14 DIAGNOSIS — J45901 Unspecified asthma with (acute) exacerbation: Secondary | ICD-10-CM | POA: Diagnosis not present

## 2016-03-14 DIAGNOSIS — F17210 Nicotine dependence, cigarettes, uncomplicated: Secondary | ICD-10-CM | POA: Diagnosis not present

## 2016-03-14 DIAGNOSIS — J209 Acute bronchitis, unspecified: Secondary | ICD-10-CM | POA: Insufficient documentation

## 2016-03-14 DIAGNOSIS — R091 Pleurisy: Secondary | ICD-10-CM | POA: Diagnosis present

## 2016-03-14 LAB — CBC
HCT: 40.1 % (ref 35.0–47.0)
Hemoglobin: 13.9 g/dL (ref 12.0–16.0)
MCH: 30.5 pg (ref 26.0–34.0)
MCHC: 34.7 g/dL (ref 32.0–36.0)
MCV: 87.7 fL (ref 80.0–100.0)
PLATELETS: 247 10*3/uL (ref 150–440)
RBC: 4.57 MIL/uL (ref 3.80–5.20)
RDW: 13.3 % (ref 11.5–14.5)
WBC: 13.3 10*3/uL — AB (ref 3.6–11.0)

## 2016-03-14 LAB — BASIC METABOLIC PANEL
Anion gap: 6 (ref 5–15)
BUN: 13 mg/dL (ref 6–20)
CHLORIDE: 107 mmol/L (ref 101–111)
CO2: 24 mmol/L (ref 22–32)
CREATININE: 0.8 mg/dL (ref 0.44–1.00)
Calcium: 9.4 mg/dL (ref 8.9–10.3)
Glucose, Bld: 106 mg/dL — ABNORMAL HIGH (ref 65–99)
Potassium: 3.9 mmol/L (ref 3.5–5.1)
SODIUM: 137 mmol/L (ref 135–145)

## 2016-03-14 LAB — TROPONIN I

## 2016-03-14 MED ORDER — KETOROLAC TROMETHAMINE 60 MG/2ML IM SOLN
15.0000 mg | Freq: Once | INTRAMUSCULAR | Status: AC
  Administered 2016-03-14: 15 mg via INTRAMUSCULAR
  Filled 2016-03-14: qty 2

## 2016-03-14 MED ORDER — NAPROXEN 500 MG PO TABS: 500.0000 mg | ORAL_TABLET | Freq: Two times a day (BID) | ORAL | 0 refills | Status: DC

## 2016-03-14 MED ORDER — PREDNISONE 20 MG PO TABS
60.0000 mg | ORAL_TABLET | Freq: Once | ORAL | Status: AC
  Administered 2016-03-14: 60 mg via ORAL
  Filled 2016-03-14: qty 3

## 2016-03-14 MED ORDER — PREDNISONE 20 MG PO TABS: 40.0000 mg | ORAL_TABLET | Freq: Every day | ORAL | 0 refills | Status: DC

## 2016-03-14 MED ORDER — IPRATROPIUM-ALBUTEROL 0.5-2.5 (3) MG/3ML IN SOLN
3.0000 mL | Freq: Once | RESPIRATORY_TRACT | Status: AC
Start: 2016-03-14 — End: 2016-03-14
  Administered 2016-03-14: 3 mL via RESPIRATORY_TRACT
  Filled 2016-03-14 (×2): qty 3

## 2016-03-14 MED ORDER — ALBUTEROL SULFATE (2.5 MG/3ML) 0.083% IN NEBU
5.0000 mg | INHALATION_SOLUTION | Freq: Once | RESPIRATORY_TRACT | Status: AC
  Administered 2016-03-14: 5 mg via RESPIRATORY_TRACT
  Filled 2016-03-14 (×2): qty 6

## 2016-03-14 MED ORDER — ALBUTEROL SULFATE HFA 108 (90 BASE) MCG/ACT IN AERS: 2.0000 | INHALATION_SPRAY | RESPIRATORY_TRACT | 0 refills | Status: DC | PRN

## 2016-03-14 NOTE — ED Triage Notes (Signed)
Pt c/o cough x 1 week, dry, non-productive. Pt denies fever, c/o chills. Pt states she becomes short of breath after coughing. Pt c/o chest pain associated w/ cough. Pt states vomiting after coughing, intermittent, result of coughing until she gags, usually after eating. Pt A&O x4 and ambulatory to triage, able to speak in complete sentences.

## 2016-03-14 NOTE — ED Provider Notes (Signed)
[facility] Emergency Department Provider Note  ____________________________________________  Time seen: Approximately 8:07 PM  I have reviewed the triage vital signs and the nursing notes.   HISTORY  Chief Complaint Pleurisy    HPI [name] is a 30 y.o. female who complains of a tightness in the center of the chest that is nonradiating, intermittent, worse with coughing. Coughing is nonproductive, provoked by deep breathing. This is been going on for 2 days. She's been using her inhaler today with only mild relief. No recent travel trauma hospitalizations or surgeries. No history of DVT or PE. Eating and drinking normally. No fever chills or sweats. No syncope.     Past Medical History:  Diagnosis Date  . Allergy   . Asthma   . Bipolar 1 disorder (HCC)   . Bipolar disorder (HCC)   . Ectopic pregnancy   . GERD (gastroesophageal reflux disease)   . History of concussion 2016  . Hyperlipidemia   . Migraine   . MRSA (methicillin resistant Staphylococcus aureus)   . Pseudoseizures   . Scoliosis   . Scoliosis      Patient Active Problem List   Diagnosis Date Noted  . Need for diphtheria-tetanus-pertussis (Tdap) vaccine, adult/adolescent 04/05/2015  . Abnormal weight gain 03/12/2015  . Encounter for medication monitoring 03/12/2015  . Abdominal pain, acute, right lower quadrant 03/02/2015  . Bipolar affective disorder (HCC) 01/26/2015  . Convulsion (HCC) 01/25/2015  . Seizures (HCC) 01/12/2015  . Asthma   . Hyperlipidemia   . Bipolar disorder (HCC)   . Allergy   . MRSA (methicillin resistant Staphylococcus aureus)   . Migraine headache with aura      Past Surgical History:  Procedure Laterality Date  . CARPAL TUNNEL RELEASE  2015   left  . HERNIA REPAIR  2010  . TUBAL LIGATION       Prior to Admission medications   Medication Sig Start Date End Date Taking? Authorizing Provider  albuterol (PROVENTIL HFA) 108 (90 Base)  MCG/ACT inhaler Inhale 2 puffs into the lungs every 4 (four) hours as needed for wheezing or shortness of breath. 03/14/16   Sharman Cheek, MD  cephALEXin (KEFLEX) 500 MG capsule Take 1 capsule (500 mg total) by mouth 4 (four) times daily. 01/08/16   Tommi Rumps, PA-C  cetirizine (ZYRTEC) 10 MG tablet Take 10 mg by mouth daily.    Historical Provider, MD  divalproex (DEPAKOTE) 500 MG DR tablet Take 500 mg by mouth 2 (two) times daily.     Historical Provider, MD  fluconazole (DIFLUCAN) 150 MG tablet 1 tablet po if needed for yeast infection 01/08/16   Tommi Rumps, PA-C  FLUoxetine (PROZAC) 20 MG tablet Take 20 mg by mouth daily.    Historical Provider, MD  ibuprofen (ADVIL,MOTRIN) 800 MG tablet Take 1 tablet (800 mg total) by mouth every 8 (eight) hours as needed. 10/11/15   Charmayne Sheer Beers, PA-C  montelukast (SINGULAIR) 10 MG tablet Take 10 mg by mouth at bedtime.    Historical Provider, MD  Multiple Vitamin (MULTIVITAMIN) tablet Take 1 tablet by mouth daily.    Historical Provider, MD  naproxen (NAPROSYN) 500 MG tablet Take 1 tablet (500 mg total) by mouth 2 (two) times daily with a meal. 03/14/16   Sharman Cheek, MD  ondansetron (ZOFRAN ODT) 4 MG disintegrating tablet Take 1 tablet (4 mg total) by mouth every 8 (eight) hours as needed for nausea or vomiting. 01/08/16   Tommi Rumps, PA-C  predniSONE (DELTASONE) 20 MG tablet Take 2 tablets (40 mg total) by mouth daily. 03/14/16   Sharman Cheek, MD  traZODone (DESYREL) 50 MG tablet 50-100 mg at bedtime. 07/07/15   Historical Provider, MD  VENTOLIN HFA 108 (90 BASE) MCG/ACT inhaler Inhale 2 puffs into the lungs every 4 (four) hours as needed for wheezing or shortness of breath. 04/05/15   Kerman Passey, MD  vitamin B-12 (CYANOCOBALAMIN) 1000 MCG tablet Take 1,000 mcg by mouth daily.    Historical Provider, MD  vitamin C (ASCORBIC ACID) 500 MG tablet Take 500 mg by mouth daily.    Historical Provider, MD     Allergies Robitussin  (alcohol free) [guaifenesin]; Aspirin; Tramadol; and Codeine   Family History  Problem Relation Age of Onset  . Asthma Mother   . Heart disease Mother   . Diabetes Mother   . Hyperlipidemia Mother   . Hypertension Mother   . Mental illness Mother   . Migraines Mother   . Thyroid disease Mother   . Stroke Mother   . Hypertension Sister   . Cancer Maternal Grandmother   . Diabetes Maternal Grandmother   . Heart disease Maternal Grandmother   . Hyperlipidemia Maternal Grandmother   . Hypertension Maternal Grandmother   . Kidney disease Maternal Grandmother   . Heart disease Paternal Grandfather     Social History Social History  Substance Use Topics  . Smoking status: Current Every Day Smoker    Packs/day: 1.00    Types: Cigarettes  . Smokeless tobacco: Never Used  . Alcohol use No    Review of Systems  Constitutional:   No fever or chills.  ENT:   Positive sore throat and rhinorrhea. Cardiovascular:   Positive as above chest pain. Respiratory:   Positive shortness of breath and nonproductive cough. No hemoptysis. Gastrointestinal:   Negative for abdominal pain, vomiting and diarrhea.  Musculoskeletal:   Negative for focal pain or swelling 10-point ROS otherwise negative.  ____________________________________________   PHYSICAL EXAM:  VITAL SIGNS: ED Triage Vitals  Enc Vitals Group     BP 03/14/16 1917 114/69     Pulse Rate 03/14/16 1917 (!) 110     Resp 03/14/16 1917 18     Temp 03/14/16 1917 98.6 F (37 C)     Temp Source 03/14/16 1917 Oral     SpO2 03/14/16 1917 98 %     Weight 03/14/16 1917 181 lb (82.1 kg)     Height 03/14/16 1917 5\' 5"  (1.651 m)     Head Circumference --      Peak Flow --      Pain Score 03/14/16 1918 5     Pain Loc --      Pain Edu? --      Excl. in GC? --     Vital signs reviewed, nursing assessments reviewed.   Constitutional:   Alert and oriented. Well appearing and in no distress. Eyes:   No scleral icterus. No  conjunctival pallor. PERRL. EOMI.  No nystagmus. ENT   Head:   Normocephalic and atraumatic.   Nose:   No congestion/rhinnorhea. No septal hematoma   Mouth/Throat:   MMM, no pharyngeal erythema. No peritonsillar mass.    Neck:   No stridor. No SubQ emphysema. No meningismus. Hematological/Lymphatic/Immunilogical:   No cervical lymphadenopathy. Cardiovascular:   RRRHeart rate 90. Symmetric bilateral radial and DP pulses.  No murmurs.  Respiratory:   Normal respiratory effort without tachypnea nor retractions. Diffuse expiratory wheezing. Deep breath provokes  coughing fit. No rales or rhonchi. Symmetric air entry Gastrointestinal:   Soft and nontender. Non distended. There is no CVA tenderness.  No rebound, rigidity, or guarding. Genitourinary:   deferred Musculoskeletal:   Nontender with normal range of motion in all extremities. No joint effusions.  No lower extremity tenderness.  No edema. Neurologic:   Normal speech and language.  CN 2-10 normal. Motor grossly intact. No gross focal neurologic deficits are appreciated.  Skin:    Skin is warm, dry and intact. No rash noted.  No petechiae, purpura, or bullae.  ____________________________________________    LABS (pertinent positives/negatives) (all labs ordered are listed, but only abnormal results are displayed) Labs Reviewed  BASIC METABOLIC PANEL - Abnormal; Notable for the following:       Result Value   Glucose, Bld 106 (*)    All other components within normal limits  CBC - Abnormal; Notable for the following:    WBC 13.3 (*)    All other components within normal limits  TROPONIN I   ____________________________________________   EKG  Interpreted by me Sinus tachycardia rate 103, normal axis intervals QRS ST segments and T waves. No evidence of right heart strain.  ____________________________________________    RADIOLOGY  Chest x-ray  unremarkable  ____________________________________________   PROCEDURES Procedures  ____________________________________________   INITIAL IMPRESSION / ASSESSMENT AND PLAN / ED COURSE  Pertinent labs & imaging results that were available during my care of the patient were reviewed by me and considered in my medical decision making (see chart for details).  Patient well appearing no acute distress. Presents with chest tightness shortness of breath and wheezing. Clinically her exam is consistent with bronchitis and asthma exacerbation. X-rays unremarkable. Vital signs unremarkable except for mild tachycardia in triage which I think is due to bronchodilator use. Low suspicion for PE pneumothorax carditis ACS dissection pneumonia or sepsis.  Prednisone, bronchodilators, NSAIDs, follow-up with primary care. Usual Return precautions given.     Clinical Course   ____________________________________________   FINAL CLINICAL IMPRESSION(S) / ED DIAGNOSES  Final diagnoses:  RAD (reactive airway disease), unspecified asthma severity, with acute exacerbation  Acute bronchitis, unspecified organism       Portions of this note were generated with dragon dictation software. Dictation errors may occur despite best attempts at proofreading.    Sharman CheekPhillip Onyekachi Gathright, MD 03/14/16 2011

## 2016-07-03 ENCOUNTER — Emergency Department
Admission: EM | Admit: 2016-07-03 | Discharge: 2016-07-03 | Disposition: A | Discharge status: Home / Self Care | Payer: Self-pay | Attending: Emergency Medicine | Admitting: Emergency Medicine

## 2016-07-03 ENCOUNTER — Emergency Department: Payer: Self-pay

## 2016-07-03 ENCOUNTER — Encounter: Payer: Self-pay | Admitting: *Deleted

## 2016-07-03 DIAGNOSIS — M25552 Pain in left hip: Secondary | ICD-10-CM | POA: Insufficient documentation

## 2016-07-03 DIAGNOSIS — Y939 Activity, unspecified: Secondary | ICD-10-CM | POA: Insufficient documentation

## 2016-07-03 DIAGNOSIS — Y929 Unspecified place or not applicable: Secondary | ICD-10-CM | POA: Insufficient documentation

## 2016-07-03 DIAGNOSIS — Y999 Unspecified external cause status: Secondary | ICD-10-CM | POA: Insufficient documentation

## 2016-07-03 DIAGNOSIS — F17210 Nicotine dependence, cigarettes, uncomplicated: Secondary | ICD-10-CM | POA: Insufficient documentation

## 2016-07-03 DIAGNOSIS — J45909 Unspecified asthma, uncomplicated: Secondary | ICD-10-CM | POA: Insufficient documentation

## 2016-07-03 DIAGNOSIS — W19XXXA Unspecified fall, initial encounter: Secondary | ICD-10-CM | POA: Insufficient documentation

## 2016-07-03 DIAGNOSIS — Z79899 Other long term (current) drug therapy: Secondary | ICD-10-CM | POA: Insufficient documentation

## 2016-07-03 LAB — POCT PREGNANCY, URINE: PREG TEST UR: NEGATIVE

## 2016-07-03 MED ORDER — OXYCODONE-ACETAMINOPHEN 7.5-325 MG PO TABS: 1.0000 | ORAL_TABLET | Freq: Four times a day (QID) | ORAL | 0 refills | Status: DC | PRN

## 2016-07-03 MED ORDER — OXYCODONE-ACETAMINOPHEN 5-325 MG PO TABS
1.0000 | ORAL_TABLET | Freq: Once | ORAL | Status: AC
  Administered 2016-07-03: 1 via ORAL
  Filled 2016-07-03: qty 1

## 2016-07-03 NOTE — ED Triage Notes (Signed)
States she fell on Sunday and is now having left hip pain, ambulatory to triage

## 2016-07-03 NOTE — ED Provider Notes (Signed)
[facility] Emergency Department Provider Note   ____________________________________________   First MD Initiated Contact with Patient 07/03/16 1330     (approximate)  I have reviewed the triage vital signs and the nursing notes.   HISTORY  Chief Complaint Hip Pain    HPI [name] is a 31 y.o. female patient complaining of left hip pain secondary to a fall 2 days ago. Patient pain is increase especially with ambulation and weightbearing. No palliative measures for this complaint. Patient rates the pain as a 4/10. Patient described a pain as "achy".   Past Medical History:  Diagnosis Date  . Allergy   . Asthma   . Bipolar 1 disorder (HCC)   . Bipolar disorder (HCC)   . Ectopic pregnancy   . GERD (gastroesophageal reflux disease)   . History of concussion 2016  . Hyperlipidemia   . Migraine   . MRSA (methicillin resistant Staphylococcus aureus)   . Pseudoseizures   . Scoliosis   . Scoliosis     Patient Active Problem List   Diagnosis Date Noted  . Need for diphtheria-tetanus-pertussis (Tdap) vaccine, adult/adolescent 04/05/2015  . Abnormal weight gain 03/12/2015  . Encounter for medication monitoring 03/12/2015  . Abdominal pain, acute, right lower quadrant 03/02/2015  . Bipolar affective disorder (HCC) 01/26/2015  . Convulsion (HCC) 01/25/2015  . Seizures (HCC) 01/12/2015  . Asthma   . Hyperlipidemia   . Bipolar disorder (HCC)   . Allergy   . MRSA (methicillin resistant Staphylococcus aureus)   . Migraine headache with aura     Past Surgical History:  Procedure Laterality Date  . CARPAL TUNNEL RELEASE  2015   left  . HERNIA REPAIR  2010  . TUBAL LIGATION      Prior to Admission medications   Medication Sig Start Date End Date Taking? Authorizing Provider  albuterol (PROVENTIL HFA) 108 (90 Base) MCG/ACT inhaler Inhale 2 puffs into the lungs every 4 (four) hours as needed for wheezing or shortness of breath. 03/14/16    Sharman CheekPhillip Stafford, MD  cephALEXin (KEFLEX) 500 MG capsule Take 1 capsule (500 mg total) by mouth 4 (four) times daily. 01/08/16   Tommi Rumpshonda L Summers, PA-C  cetirizine (ZYRTEC) 10 MG tablet Take 10 mg by mouth daily.    Historical Provider, MD  divalproex (DEPAKOTE) 500 MG DR tablet Take 500 mg by mouth 2 (two) times daily.     Historical Provider, MD  fluconazole (DIFLUCAN) 150 MG tablet 1 tablet po if needed for yeast infection 01/08/16   Tommi Rumpshonda L Summers, PA-C  FLUoxetine (PROZAC) 20 MG tablet Take 20 mg by mouth daily.    Historical Provider, MD  ibuprofen (ADVIL,MOTRIN) 800 MG tablet Take 1 tablet (800 mg total) by mouth every 8 (eight) hours as needed. 10/11/15   Charmayne Sheerharles M Beers, PA-C  montelukast (SINGULAIR) 10 MG tablet Take 10 mg by mouth at bedtime.    Historical Provider, MD  Multiple Vitamin (MULTIVITAMIN) tablet Take 1 tablet by mouth daily.    Historical Provider, MD  naproxen (NAPROSYN) 500 MG tablet Take 1 tablet (500 mg total) by mouth 2 (two) times daily with a meal. 03/14/16   Sharman CheekPhillip Stafford, MD  ondansetron (ZOFRAN ODT) 4 MG disintegrating tablet Take 1 tablet (4 mg total) by mouth every 8 (eight) hours as needed for nausea or vomiting. 01/08/16   Tommi Rumpshonda L Summers, PA-C  predniSONE (DELTASONE) 20 MG tablet Take 2 tablets (40 mg total) by mouth daily. 03/14/16   Sharman CheekPhillip Stafford,  MD  traZODone (DESYREL) 50 MG tablet 50-100 mg at bedtime. 07/07/15   Historical Provider, MD  VENTOLIN HFA 108 (90 BASE) MCG/ACT inhaler Inhale 2 puffs into the lungs every 4 (four) hours as needed for wheezing or shortness of breath. 04/05/15   Kerman Passey, MD  vitamin B-12 (CYANOCOBALAMIN) 1000 MCG tablet Take 1,000 mcg by mouth daily.    Historical Provider, MD  vitamin C (ASCORBIC ACID) 500 MG tablet Take 500 mg by mouth daily.    Historical Provider, MD    Allergies Robitussin (alcohol free) [guaifenesin]; Aspirin; Tramadol; and Codeine  Family History  Problem Relation Age of Onset  . Asthma Mother    . Heart disease Mother   . Diabetes Mother   . Hyperlipidemia Mother   . Hypertension Mother   . Mental illness Mother   . Migraines Mother   . Thyroid disease Mother   . Stroke Mother   . Hypertension Sister   . Cancer Maternal Grandmother   . Diabetes Maternal Grandmother   . Heart disease Maternal Grandmother   . Hyperlipidemia Maternal Grandmother   . Hypertension Maternal Grandmother   . Kidney disease Maternal Grandmother   . Heart disease Paternal Grandfather     Social History Social History  Substance Use Topics  . Smoking status: Current Every Day Smoker    Packs/day: 1.00    Types: Cigarettes  . Smokeless tobacco: Never Used  . Alcohol use No    Review of Systems Constitutional: No fever/chills Eyes: No visual changes. ENT: No sore throat. Cardiovascular: Denies chest pain. Respiratory: Denies shortness of breath. Gastrointestinal: No abdominal pain.  No nausea, no vomiting.  No diarrhea.  No constipation. Genitourinary: Negative for dysuria. Musculoskeletal: Left hip pain Skin: Negative for rash. Neurological: Negative for headaches, focal weakness or numbness. Psychiatric:Bipolar Endocrine:Hyperlipidemia Hematological/Lymphatic: Allergic/Immunilogical: See medication list ____________________________________________   PHYSICAL EXAM:  VITAL SIGNS: ED Triage Vitals  Enc Vitals Group     BP 07/03/16 1259 121/77     Pulse Rate 07/03/16 1259 (!) 55     Resp 07/03/16 1259 18     Temp 07/03/16 1259 98 F (36.7 C)     Temp Source 07/03/16 1259 Oral     SpO2 07/03/16 1259 94 %     Weight 07/03/16 1300 195 lb (88.5 kg)     Height 07/03/16 1300 5\' 5"  (1.651 m)     Head Circumference --      Peak Flow --      Pain Score 07/03/16 1300 8     Pain Loc --      Pain Edu? --      Excl. in GC? --     Constitutional: Alert and oriented. Well appearing and in no acute distress. Eyes: Conjunctivae are normal. PERRL. EOMI. Head: Atraumatic. Nose: No  congestion/rhinnorhea. Mouth/Throat: Mucous membranes are moist.  Oropharynx non-erythematous. Neck: No stridor.  No cervical spine tenderness to palpation. Hematological/Lymphatic/Immunilogical: No cervical lymphadenopathy. Cardiovascular: Normal rate, regular rhythm. Grossly normal heart sounds.  Good peripheral circulation. Respiratory: Normal respiratory effort.  No retractions. Lungs CTAB. Gastrointestinal: Soft and nontender. No distention. No abdominal bruits. No CVA tenderness. Musculoskeletal: No obvious deformity to the left hip. No leg length discrepancy. Patient tender to palpation at the greater trochanter and posterior hip. Patient pain increases with weightbearing she is ambulatory with atypical gait. Neurologic:  Normal speech and language. No gross focal neurologic deficits are appreciated. No gait instability. Skin:  Skin is warm, dry and intact. No  rash noted. No abrasions or ecchymosis from the fall. Psychiatric: Mood and affect are normal. Speech and behavior are normal.  ____________________________________________   LABS (all labs ordered are listed, but only abnormal results are displayed)  Labs Reviewed - No data to display ____________________________________________  EKG   ____________________________________________  RADIOLOGY  No acute final x-ray of the left hip. ____________________________________________   PROCEDURES  Procedure(s) performed: None  Procedures  Critical Care performed: No  ____________________________________________   INITIAL IMPRESSION / ASSESSMENT AND PLAN / ED COURSE  Pertinent labs & imaging results that were available during my care of the patient were reviewed by me and considered in my medical decision making (see chart for details).  Left hip pain secondary to contusion. Patient given discharge Instructions. Patient given 3 days prescription for Percocets. Patient is to follow-up with PCP if condition  persists.  Clinical Course      ____________________________________________   FINAL CLINICAL IMPRESSION(S) / ED DIAGNOSES  Final diagnoses:  None      NEW MEDICATIONS STARTED DURING THIS VISIT:  New Prescriptions   No medications on file     Note:  This document was prepared using Dragon voice recognition software and may include unintentional dictation errors.    Joni Reining, PA-C 07/03/16 1435    Charlynne Pander, MD 07/03/16 [phone]

## 2016-07-24 ENCOUNTER — Emergency Department
Admission: EM | Admit: 2016-07-24 | Discharge: 2016-07-24 | Disposition: A | Discharge status: Home / Self Care | Payer: Self-pay | Attending: Emergency Medicine | Admitting: Emergency Medicine

## 2016-07-24 ENCOUNTER — Emergency Department: Payer: Self-pay

## 2016-07-24 ENCOUNTER — Encounter: Payer: Self-pay | Admitting: Emergency Medicine

## 2016-07-24 DIAGNOSIS — W208XXA Other cause of strike by thrown, projected or falling object, initial encounter: Secondary | ICD-10-CM | POA: Insufficient documentation

## 2016-07-24 DIAGNOSIS — Z79899 Other long term (current) drug therapy: Secondary | ICD-10-CM | POA: Insufficient documentation

## 2016-07-24 DIAGNOSIS — Y999 Unspecified external cause status: Secondary | ICD-10-CM | POA: Insufficient documentation

## 2016-07-24 DIAGNOSIS — S9032XA Contusion of left foot, initial encounter: Secondary | ICD-10-CM | POA: Insufficient documentation

## 2016-07-24 DIAGNOSIS — Y929 Unspecified place or not applicable: Secondary | ICD-10-CM | POA: Insufficient documentation

## 2016-07-24 DIAGNOSIS — J45909 Unspecified asthma, uncomplicated: Secondary | ICD-10-CM | POA: Insufficient documentation

## 2016-07-24 DIAGNOSIS — Y939 Activity, unspecified: Secondary | ICD-10-CM | POA: Insufficient documentation

## 2016-07-24 DIAGNOSIS — F17210 Nicotine dependence, cigarettes, uncomplicated: Secondary | ICD-10-CM | POA: Insufficient documentation

## 2016-07-24 MED ORDER — MELOXICAM 15 MG PO TABS: 15.0000 mg | ORAL_TABLET | Freq: Every day | ORAL | 0 refills | Status: AC

## 2016-07-24 NOTE — ED Provider Notes (Signed)
[facility] Emergency Department Provider Note  ____________________________________________  Time seen: Approximately 5:51 PM  I have reviewed the triage vital signs and the nursing notes.   HISTORY  Chief Complaint Foot Injury    HPI [name] is a 31 y.o. female that presents to the emergency department with left foot pain after a can of soup fell out of the cupboard onto foot yesterday. She states the foot is extremely tender to touch and patient has not been able to bear weight since incident. Patient states that she can move and feel her toes. She is having some tingling in her toes. There is a small cut on the top of foot were can hit. Bleeding was controlled and injury is beginning to heal. No additional injuries. Patient denies fever, nausea, vomiting, ankle pain.   Past Medical History:  Diagnosis Date  . Allergy   . Asthma   . Bipolar 1 disorder (HCC)   . Bipolar disorder (HCC)   . Ectopic pregnancy   . GERD (gastroesophageal reflux disease)   . History of concussion 2016  . Hyperlipidemia   . Migraine   . MRSA (methicillin resistant Staphylococcus aureus)   . Pseudoseizures   . Scoliosis   . Scoliosis     Patient Active Problem List   Diagnosis Date Noted  . Need for diphtheria-tetanus-pertussis (Tdap) vaccine, adult/adolescent 04/05/2015  . Abnormal weight gain 03/12/2015  . Encounter for medication monitoring 03/12/2015  . Abdominal pain, acute, right lower quadrant 03/02/2015  . Bipolar affective disorder (HCC) 01/26/2015  . Convulsion (HCC) 01/25/2015  . Seizures (HCC) 01/12/2015  . Asthma   . Hyperlipidemia   . Bipolar disorder (HCC)   . Allergy   . MRSA (methicillin resistant Staphylococcus aureus)   . Migraine headache with aura     Past Surgical History:  Procedure Laterality Date  . CARPAL TUNNEL RELEASE  2015   left  . HERNIA REPAIR  2010  . TUBAL LIGATION      Prior to Admission medications   Medication  Sig Start Date End Date Taking? Authorizing Provider  albuterol (PROVENTIL HFA) 108 (90 Base) MCG/ACT inhaler Inhale 2 puffs into the lungs every 4 (four) hours as needed for wheezing or shortness of breath. 03/14/16   Sharman Cheek, MD  cephALEXin (KEFLEX) 500 MG capsule Take 1 capsule (500 mg total) by mouth 4 (four) times daily. 01/08/16   Tommi Rumps, PA-C  cetirizine (ZYRTEC) 10 MG tablet Take 10 mg by mouth daily.    Historical Provider, MD  divalproex (DEPAKOTE) 500 MG DR tablet Take 500 mg by mouth 2 (two) times daily.     Historical Provider, MD  fluconazole (DIFLUCAN) 150 MG tablet 1 tablet po if needed for yeast infection 01/08/16   Tommi Rumps, PA-C  FLUoxetine (PROZAC) 20 MG tablet Take 20 mg by mouth daily.    Historical Provider, MD  ibuprofen (ADVIL,MOTRIN) 800 MG tablet Take 1 tablet (800 mg total) by mouth every 8 (eight) hours as needed. 10/11/15   Evangeline Dakin, PA-C  meloxicam (MOBIC) 15 MG tablet Take 1 tablet (15 mg total) by mouth daily. 07/24/16 08/03/16  Enid Derry, PA-C  montelukast (SINGULAIR) 10 MG tablet Take 10 mg by mouth at bedtime.    Historical Provider, MD  Multiple Vitamin (MULTIVITAMIN) tablet Take 1 tablet by mouth daily.    Historical Provider, MD  naproxen (NAPROSYN) 500 MG tablet Take 1 tablet (500 mg total) by mouth 2 (two) times daily  with a meal. 03/14/16   Sharman CheekPhillip Stafford, MD  ondansetron (ZOFRAN ODT) 4 MG disintegrating tablet Take 1 tablet (4 mg total) by mouth every 8 (eight) hours as needed for nausea or vomiting. 01/08/16   Tommi Rumpshonda L Summers, PA-C  oxyCODONE-acetaminophen (PERCOCET) 7.5-325 MG tablet Take 1 tablet by mouth every 6 (six) hours as needed for severe pain. 07/03/16   Joni Reiningonald K Smith, PA-C  predniSONE (DELTASONE) 20 MG tablet Take 2 tablets (40 mg total) by mouth daily. 03/14/16   Sharman CheekPhillip Stafford, MD  traZODone (DESYREL) 50 MG tablet 50-100 mg at bedtime. 07/07/15   Historical Provider, MD  VENTOLIN HFA 108 (90 BASE) MCG/ACT inhaler  Inhale 2 puffs into the lungs every 4 (four) hours as needed for wheezing or shortness of breath. 04/05/15   Kerman PasseyMelinda P Lada, MD  vitamin B-12 (CYANOCOBALAMIN) 1000 MCG tablet Take 1,000 mcg by mouth daily.    Historical Provider, MD  vitamin C (ASCORBIC ACID) 500 MG tablet Take 500 mg by mouth daily.    Historical Provider, MD    Allergies Robitussin (alcohol free) [guaifenesin]; Aspirin; Tramadol; and Codeine  Family History  Problem Relation Age of Onset  . Asthma Mother   . Heart disease Mother   . Diabetes Mother   . Hyperlipidemia Mother   . Hypertension Mother   . Mental illness Mother   . Migraines Mother   . Thyroid disease Mother   . Stroke Mother   . Hypertension Sister   . Cancer Maternal Grandmother   . Diabetes Maternal Grandmother   . Heart disease Maternal Grandmother   . Hyperlipidemia Maternal Grandmother   . Hypertension Maternal Grandmother   . Kidney disease Maternal Grandmother   . Heart disease Paternal Grandfather     Social History Social History  Substance Use Topics  . Smoking status: Current Every Day Smoker    Packs/day: 1.00    Types: Cigarettes  . Smokeless tobacco: Never Used  . Alcohol use No     Review of Systems  Constitutional: No fever/chills ENT: No upper respiratory complaints. Cardiovascular: No chest pain. Respiratory: No cough. No SOB. Gastrointestinal: No abdominal pain.  No nausea, no vomiting.  Skin: Negative for rash,  ecchymosis. Neurological: Negative for headaches   ____________________________________________   PHYSICAL EXAM:  VITAL SIGNS: ED Triage Vitals  Enc Vitals Group     BP 07/24/16 1451 109/63     Pulse Rate 07/24/16 1451 91     Resp 07/24/16 1451 20     Temp 07/24/16 1451 98.3 F (36.8 C)     Temp Source 07/24/16 1451 Oral     SpO2 07/24/16 1451 100 %     Weight 07/24/16 1452 195 lb (88.5 kg)     Height 07/24/16 1452 5\' 5"  (1.651 m)     Head Circumference --      Peak Flow --      Pain Score  07/24/16 1504 7     Pain Loc --      Pain Edu? --      Excl. in GC? --      Constitutional: Alert and oriented. Well appearing and in no acute distress. Eyes: Conjunctivae are normal. PERRL. EOMI. Head: Atraumatic. ENT:      Ears:      Nose: No congestion/rhinnorhea.      Mouth/Throat: Mucous membranes are moist.  Neck: No stridor. Cardiovascular: Normal rate, regular rhythm.  Good peripheral circulation. 2+ dorsalis pedis and posterior tibialis pulses. Respiratory: Normal respiratory effort without  tachypnea or retractions. Lungs CTAB. Good air entry to the bases with no decreased or absent breath sounds. Musculoskeletal: Full range of motion to all extremities. No gross deformities appreciated. Extremity tenderness to palpation over entire foot. Neurologic:  Normal speech and language. No gross focal neurologic deficits are appreciated.  Sensation intact. Skin:  Skin is warm, dry and intact. No rash noted. 1/2 centimeter well-healing abrasion to top of left foot. Psychiatric: Mood and affect are normal. Speech and behavior are normal. Patient exhibits appropriate insight and judgement.   ____________________________________________   LABS (all labs ordered are listed, but only abnormal results are displayed)  Labs Reviewed - No data to display ____________________________________________  EKG   ____________________________________________  RADIOLOGY Lexine Baton, personally viewed and evaluated these images (plain radiographs) as part of my medical decision making, as well as reviewing the written report by the radiologist.  Dg Foot Complete Left  Result Date: 07/24/2016 CLINICAL DATA:  LEFT foot pain, dropped a large can assume on top of her foot last night, reddened, bruised, unable to bear weight, toes tingling and numb EXAM: LEFT FOOT - COMPLETE 3+ VIEW COMPARISON:  None FINDINGS: Osseous mineralization normal. Joint spaces preserved. No fracture, dislocation, or  bone destruction. IMPRESSION: Normal exam. Electronically Signed   By: Ulyses Southward M.D.   On: 07/24/2016 16:40    ____________________________________________    PROCEDURES  Procedure(s) performed:    Procedures    Medications - No data to display   ____________________________________________   INITIAL IMPRESSION / ASSESSMENT AND PLAN / ED COURSE  Pertinent labs & imaging results that were available during my care of the patient were reviewed by me and considered in my medical decision making (see chart for details).  Review of the [place] CSRS was performed in accordance of the [facility] prior to dispensing any controlled drugs.     Patient's diagnosis is consistent with foot injury. Vital signs and exam are reassuring. X-ray negative for any acute bony abnormalities. Patient was given crutches and foot was Ace wrapped in ED. Patient will be discharged home with prescriptions for Meloxicam. Patient is to follow up with PCP as directed. Patient is given ED precautions to return to the ED for any worsening or new symptoms.     ____________________________________________  FINAL CLINICAL IMPRESSION(S) / ED DIAGNOSES  Final diagnoses:  Contusion of left foot, initial encounter      NEW MEDICATIONS STARTED DURING THIS VISIT:  Discharge Medication List as of 07/24/2016  5:20 PM    START taking these medications   Details  meloxicam (MOBIC) 15 MG tablet Take 1 tablet (15 mg total) by mouth daily., Starting Tue 07/24/2016, Until Fri 08/03/2016, Print            This chart was dictated using voice recognition software/Dragon. Despite best efforts to proofread, errors can occur which can change the meaning. Any change was purely unintentional.    Enid Derry, PA-C 07/24/16 1755    Myrna Blazer, MD 07/25/16 2350

## 2016-07-24 NOTE — ED Triage Notes (Signed)
Patient states she dropped a couple of soup cans on her left foot last PM.  Used ice and elevation, without improvement.  Here because of "tingling and numbness" in great toe.  Small abrasion noted top of foot.  No obvious deformity.

## 2016-07-24 NOTE — ED Notes (Signed)
Pt reports left foot pain - she dropped a large can of soup on the top of her foot last night - the top of foot is slightly red and bruised - pt reports inability to bear wight on left foot and reports that toes are tingling and numb

## 2016-11-18 ENCOUNTER — Encounter: Payer: Self-pay | Admitting: Emergency Medicine

## 2016-11-18 ENCOUNTER — Emergency Department
Admission: EM | Admit: 2016-11-18 | Discharge: 2016-11-18 | Disposition: A | Discharge status: Home / Self Care | Payer: Managed Care, Other (non HMO) | Attending: Emergency Medicine | Admitting: Emergency Medicine

## 2016-11-18 DIAGNOSIS — J45909 Unspecified asthma, uncomplicated: Secondary | ICD-10-CM | POA: Insufficient documentation

## 2016-11-18 DIAGNOSIS — N3000 Acute cystitis without hematuria: Secondary | ICD-10-CM | POA: Insufficient documentation

## 2016-11-18 DIAGNOSIS — E785 Hyperlipidemia, unspecified: Secondary | ICD-10-CM | POA: Insufficient documentation

## 2016-11-18 DIAGNOSIS — F17210 Nicotine dependence, cigarettes, uncomplicated: Secondary | ICD-10-CM | POA: Insufficient documentation

## 2016-11-18 DIAGNOSIS — Z79899 Other long term (current) drug therapy: Secondary | ICD-10-CM | POA: Insufficient documentation

## 2016-11-18 LAB — URINALYSIS, COMPLETE (UACMP) WITH MICROSCOPIC
Bacteria, UA: NONE SEEN
Bilirubin Urine: NEGATIVE
GLUCOSE, UA: NEGATIVE mg/dL
Hgb urine dipstick: NEGATIVE
Ketones, ur: NEGATIVE mg/dL
NITRITE: NEGATIVE
PH: 7 (ref 5.0–8.0)
Protein, ur: NEGATIVE mg/dL
SPECIFIC GRAVITY, URINE: 1.019 (ref 1.005–1.030)

## 2016-11-18 MED ORDER — CEPHALEXIN 500 MG PO CAPS: 500.0000 mg | ORAL_CAPSULE | Freq: Three times a day (TID) | ORAL | 0 refills | Status: DC

## 2016-11-18 MED ORDER — ONDANSETRON 4 MG PO TBDP
4.0000 mg | ORAL_TABLET | Freq: Once | ORAL | Status: AC
  Administered 2016-11-18: 4 mg via ORAL
  Filled 2016-11-18: qty 1

## 2016-11-18 MED ORDER — PHENAZOPYRIDINE HCL 100 MG PO TABS: 100.0000 mg | ORAL_TABLET | Freq: Three times a day (TID) | ORAL | 0 refills | Status: DC | PRN

## 2016-11-18 MED ORDER — ONDANSETRON 4 MG PO TBDP: 4.0000 mg | ORAL_TABLET | Freq: Three times a day (TID) | ORAL | 0 refills | Status: DC | PRN

## 2016-11-18 NOTE — ED Triage Notes (Signed)
Urinary symptoms x 4 days

## 2016-11-18 NOTE — Discharge Instructions (Signed)
Increase fluids.  Zofran if needed for nausea.  Follow up with your doctor if any continued problems.

## 2016-11-18 NOTE — ED Notes (Signed)
See triage note  Developed some urinary sx;s about 4 days ago  No fever or abd pain

## 2016-11-18 NOTE — ED Provider Notes (Signed)
[facility] Emergency Department Provider Note  ____________________________________________   First MD Initiated Contact with Patient 11/18/16 1243     (approximate)  I have reviewed the triage vital signs and the nursing notes.   HISTORY  Chief Complaint Dysuria    HPI [name] is a 31 y.o. female chief complaint of dysuria for the last 4 days. Patient has a history of urinary tract infections with the last one being approximately 8 months ago. Patient states that she is not having any burning but there is occasionally hesitancy. She has also had a history of kidney stones. She is unaware of any fever but has had some nausea without vomiting.Currently she rates her pain is 3/10.   Past Medical History:  Diagnosis Date  . Allergy   . Asthma   . Bipolar 1 disorder (HCC)   . Bipolar disorder (HCC)   . Ectopic pregnancy   . GERD (gastroesophageal reflux disease)   . History of concussion 2016  . Hyperlipidemia   . Migraine   . MRSA (methicillin resistant Staphylococcus aureus)   . Pseudoseizures   . Scoliosis   . Scoliosis     Patient Active Problem List   Diagnosis Date Noted  . Need for diphtheria-tetanus-pertussis (Tdap) vaccine, adult/adolescent 04/05/2015  . Abnormal weight gain 03/12/2015  . Encounter for medication monitoring 03/12/2015  . Abdominal pain, acute, right lower quadrant 03/02/2015  . Bipolar affective disorder (HCC) 01/26/2015  . Convulsion (HCC) 01/25/2015  . Seizures (HCC) 01/12/2015  . Asthma   . Hyperlipidemia   . Bipolar disorder (HCC)   . Allergy   . MRSA (methicillin resistant Staphylococcus aureus)   . Migraine headache with aura     Past Surgical History:  Procedure Laterality Date  . CARPAL TUNNEL RELEASE  2015   left  . HERNIA REPAIR  2010  . TUBAL LIGATION      Prior to Admission medications   Medication Sig Start Date End Date Taking? Authorizing Provider  cephALEXin (KEFLEX) 500 MG  capsule Take 1 capsule (500 mg total) by mouth 3 (three) times daily. 11/18/16   Tommi Rumps, PA-C  cetirizine (ZYRTEC) 10 MG tablet Take 10 mg by mouth daily.    [provider]  divalproex (DEPAKOTE) 500 MG DR tablet Take 500 mg by mouth 2 (two) times daily.     [provider]  FLUoxetine (PROZAC) 20 MG tablet Take 20 mg by mouth daily.    [provider]  montelukast (SINGULAIR) 10 MG tablet Take 10 mg by mouth at bedtime.    [provider]  Multiple Vitamin (MULTIVITAMIN) tablet Take 1 tablet by mouth daily.    [provider]  ondansetron (ZOFRAN ODT) 4 MG disintegrating tablet Take 1 tablet (4 mg total) by mouth every 8 (eight) hours as needed for nausea or vomiting. 11/18/16   Tommi Rumps, PA-C  phenazopyridine (PYRIDIUM) 100 MG tablet Take 1 tablet (100 mg total) by mouth 3 (three) times daily as needed for pain. 11/18/16 11/18/17  Tommi Rumps, PA-C  traZODone (DESYREL) 50 MG tablet 50-100 mg at bedtime. 07/07/15   [provider]  vitamin B-12 (CYANOCOBALAMIN) 1000 MCG tablet Take 1,000 mcg by mouth daily.    [provider]  vitamin C (ASCORBIC ACID) 500 MG tablet Take 500 mg by mouth daily.    [provider]    Allergies Robitussin (alcohol free) [guaifenesin]; Aspirin; Tramadol; and Codeine  Family History  Problem Relation Age  of Onset  . Asthma Mother   . Heart disease Mother   . Diabetes Mother   . Hyperlipidemia Mother   . Hypertension Mother   . Mental illness Mother   . Migraines Mother   . Thyroid disease Mother   . Stroke Mother   . Hypertension Sister   . Cancer Maternal Grandmother   . Diabetes Maternal Grandmother   . Heart disease Maternal Grandmother   . Hyperlipidemia Maternal Grandmother   . Hypertension Maternal Grandmother   . Kidney disease Maternal Grandmother   . Heart disease Paternal Grandfather     Social History Social History  Substance Use Topics  .  Smoking status: Current Every Day Smoker    Packs/day: 1.00    Types: Cigarettes  . Smokeless tobacco: Never Used  . Alcohol use No    Review of Systems Constitutional: No fever/chills Cardiovascular: Denies chest pain. Respiratory: Denies shortness of breath. Gastrointestinal: No abdominal pain.  Positive nausea, no vomiting.  Genitourinary: Positive for dysuria. Musculoskeletal: Negative for back pain. Skin: Negative for rash. Neurological: Negative for headaches, focal weakness or numbness.   ____________________________________________   PHYSICAL EXAM:  VITAL SIGNS: ED Triage Vitals  Enc Vitals Group     BP 11/18/16 1221 112/80     Pulse Rate 11/18/16 1221 (!) 114     Resp 11/18/16 1221 16     Temp 11/18/16 1221 98.4 F (36.9 C)     Temp src --      SpO2 11/18/16 1221 98 %     Weight 11/18/16 1221 195 lb (88.5 kg)     Height 11/18/16 1221 5\' 5"  (1.651 m)     Head Circumference --      Peak Flow --      Pain Score 11/18/16 1220 3     Pain Loc --      Pain Edu? --      Excl. in GC? --     Constitutional: Alert and oriented. Well appearing and in no acute distress. Eyes: Conjunctivae are normal. PERRL. EOMI. Head: Atraumatic. Nose: No congestion/rhinnorhea. Neck: No stridor.   Cardiovascular: Normal rate, regular rhythm. Grossly normal heart sounds.  Good peripheral circulation. Respiratory: Normal respiratory effort.  No retractions. Lungs CTAB. Gastrointestinal: Soft and nontender. No distention. No CVA tenderness noted. Musculoskeletal: No lower extremity tenderness nor edema.  No joint effusions. Neurologic:  Normal speech and language. No gross focal neurologic deficits are appreciated. No gait instability. Skin:  Skin is warm, dry and intact. No rash noted. Psychiatric: Mood and affect are normal. Speech and behavior are normal.  ____________________________________________   LABS (all labs ordered are listed, but only abnormal results are  displayed)  Labs Reviewed  URINALYSIS, COMPLETE (UACMP) WITH MICROSCOPIC - Abnormal; Notable for the following:       Result Value   Color, Urine YELLOW (*)    APPearance HAZY (*)    Leukocytes, UA MODERATE (*)    Squamous Epithelial / LPF 0-5 (*)    Non Squamous Epithelial 0-5 (*)    All other components within normal limits   _  PROCEDURES  Procedure(s) performed: None  Procedures  Critical Care performed: No  ____________________________________________   INITIAL IMPRESSION / ASSESSMENT AND PLAN / ED COURSE  Pertinent labs & imaging results that were available during my care of the patient were reviewed by me and considered in my medical decision making (see chart for details).  Patient was given Zofran while in the emergency department. She is  discharged with a prescription for Zofran if needed for nausea, Pyridium 100 mg 3 times a day for 3 days and Keflex 500 mg 3 times a day for 10 days. Patient will follow-up with her PCP if any continued problems.     ____________________________________________   FINAL CLINICAL IMPRESSION(S) / ED DIAGNOSES  Final diagnoses:  Acute cystitis without hematuria      NEW MEDICATIONS STARTED DURING THIS VISIT:  New Prescriptions   CEPHALEXIN (KEFLEX) 500 MG CAPSULE    Take 1 capsule (500 mg total) by mouth 3 (three) times daily.   ONDANSETRON (ZOFRAN ODT) 4 MG DISINTEGRATING TABLET    Take 1 tablet (4 mg total) by mouth every 8 (eight) hours as needed for nausea or vomiting.   PHENAZOPYRIDINE (PYRIDIUM) 100 MG TABLET    Take 1 tablet (100 mg total) by mouth 3 (three) times daily as needed for pain.     Note:  This document was prepared using Dragon voice recognition software and may include unintentional dictation errors.    Tommi Rumps, PA-C 11/18/16 1350    Arnaldo Natal, MD 11/19/16 1539

## 2016-11-20 LAB — URINE CULTURE: Special Requests: NORMAL

## 2017-01-15 ENCOUNTER — Encounter: Payer: Self-pay | Admitting: Emergency Medicine

## 2017-01-15 ENCOUNTER — Emergency Department
Admission: EM | Admit: 2017-01-15 | Discharge: 2017-01-15 | Disposition: A | Discharge status: Home / Self Care | Payer: Managed Care, Other (non HMO) | Attending: Student in an Organized Health Care Education/Training Program | Admitting: Student in an Organized Health Care Education/Training Program

## 2017-01-15 DIAGNOSIS — I10 Essential (primary) hypertension: Secondary | ICD-10-CM | POA: Insufficient documentation

## 2017-01-15 DIAGNOSIS — Z87820 Personal history of traumatic brain injury: Secondary | ICD-10-CM | POA: Insufficient documentation

## 2017-01-15 DIAGNOSIS — Z79899 Other long term (current) drug therapy: Secondary | ICD-10-CM | POA: Insufficient documentation

## 2017-01-15 DIAGNOSIS — R197 Diarrhea, unspecified: Secondary | ICD-10-CM | POA: Insufficient documentation

## 2017-01-15 DIAGNOSIS — R51 Headache: Secondary | ICD-10-CM | POA: Insufficient documentation

## 2017-01-15 DIAGNOSIS — R519 Headache, unspecified: Secondary | ICD-10-CM

## 2017-01-15 DIAGNOSIS — J45909 Unspecified asthma, uncomplicated: Secondary | ICD-10-CM | POA: Insufficient documentation

## 2017-01-15 DIAGNOSIS — F17210 Nicotine dependence, cigarettes, uncomplicated: Secondary | ICD-10-CM | POA: Insufficient documentation

## 2017-01-15 LAB — URINALYSIS, COMPLETE (UACMP) WITH MICROSCOPIC
BACTERIA UA: NONE SEEN
Bilirubin Urine: NEGATIVE
Glucose, UA: NEGATIVE mg/dL
Hgb urine dipstick: NEGATIVE
Ketones, ur: NEGATIVE mg/dL
Leukocytes, UA: NEGATIVE
NITRITE: NEGATIVE
PH: 6 (ref 5.0–8.0)
Protein, ur: NEGATIVE mg/dL
RBC / HPF: NONE SEEN RBC/hpf (ref 0–5)
SPECIFIC GRAVITY, URINE: 1.005 (ref 1.005–1.030)

## 2017-01-15 LAB — CBC
HCT: 37.5 % (ref 35.0–47.0)
Hemoglobin: 12.7 g/dL (ref 12.0–16.0)
MCH: 29.3 pg (ref 26.0–34.0)
MCHC: 33.9 g/dL (ref 32.0–36.0)
MCV: 86.5 fL (ref 80.0–100.0)
Platelets: 265 10*3/uL (ref 150–440)
RBC: 4.33 MIL/uL (ref 3.80–5.20)
RDW: 13.8 % (ref 11.5–14.5)
WBC: 10 10*3/uL (ref 3.6–11.0)

## 2017-01-15 LAB — COMPREHENSIVE METABOLIC PANEL
ALBUMIN: 3.7 g/dL (ref 3.5–5.0)
ALT: 27 U/L (ref 14–54)
AST: 24 U/L (ref 15–41)
Alkaline Phosphatase: 60 U/L (ref 38–126)
Anion gap: 9 (ref 5–15)
BILIRUBIN TOTAL: 0.4 mg/dL (ref 0.3–1.2)
BUN: 9 mg/dL (ref 6–20)
CO2: 23 mmol/L (ref 22–32)
Calcium: 8.8 mg/dL — ABNORMAL LOW (ref 8.9–10.3)
Chloride: 105 mmol/L (ref 101–111)
Creatinine, Ser: 0.65 mg/dL (ref 0.44–1.00)
GFR calc Af Amer: >60 mL/min (ref >60)
GFR calc non Af Amer: >60 mL/min (ref >60)
GLUCOSE: 102 mg/dL — AB (ref 65–99)
POTASSIUM: 4 mmol/L (ref 3.5–5.1)
Sodium: 137 mmol/L (ref 135–145)
TOTAL PROTEIN: 6.9 g/dL (ref 6.5–8.1)

## 2017-01-15 LAB — LIPASE, BLOOD: Lipase: 26 U/L (ref 11–51)

## 2017-01-15 LAB — POCT PREGNANCY, URINE: PREG TEST UR: NEGATIVE

## 2017-01-15 MED ORDER — SODIUM CHLORIDE 0.9 % IV BOLUS (SEPSIS)
1000.0000 mL | Freq: Once | INTRAVENOUS | Status: AC
  Administered 2017-01-15: 1000 mL via INTRAVENOUS

## 2017-01-15 MED ORDER — ONDANSETRON HCL 4 MG/2ML IJ SOLN
4.0000 mg | Freq: Once | INTRAMUSCULAR | Status: AC | PRN
  Administered 2017-01-15: 4 mg via INTRAVENOUS
  Filled 2017-01-15: qty 2

## 2017-01-15 MED ORDER — ACETAMINOPHEN 500 MG PO TABS
1000.0000 mg | ORAL_TABLET | Freq: Once | ORAL | Status: AC
  Administered 2017-01-15: 1000 mg via ORAL
  Filled 2017-01-15: qty 2

## 2017-01-15 MED ORDER — DEXAMETHASONE SODIUM PHOSPHATE 10 MG/ML IJ SOLN
10.0000 mg | Freq: Once | INTRAMUSCULAR | Status: AC
  Administered 2017-01-15: 10 mg via INTRAVENOUS
  Filled 2017-01-15: qty 1

## 2017-01-15 MED ORDER — PROCHLORPERAZINE EDISYLATE 5 MG/ML IJ SOLN
10.0000 mg | Freq: Once | INTRAMUSCULAR | Status: AC
  Administered 2017-01-15: 10 mg via INTRAVENOUS
  Filled 2017-01-15: qty 2

## 2017-01-15 NOTE — ED Notes (Signed)
Dr. Robinson in room to reassess patient.  Will continue to monitor.   

## 2017-01-15 NOTE — ED Notes (Signed)
MD in room to assess patient.  Will continue to monitor.   

## 2017-01-15 NOTE — ED Provider Notes (Signed)
[facility] Emergency Department Provider Note    First MD Initiated Contact with Patient 01/15/17 0630     (approximate)  I have reviewed the triage vital signs and the nursing notes.   HISTORY  Chief Complaint Headache and Diarrhea    HPI Danielle AARONS is a 31 y.o. female who has a history of migraine headaches presents with a headache lasting for the past 24 hours associated with 2 episodes of watery diarrhea today. Patient states that she is having associated nausea but no vomiting. Does endorse photophobia. No numbness or tingling. No blurry vision. Patient states is similar in nature to previous headaches however this was simply lasting longer than the previous ones. She has not taken anything to help with the headache at home. Denies any trauma. No fevers. No neck pain.   Past Medical History:  Diagnosis Date  . Allergy   . Asthma   . Bipolar 1 disorder (HCC)   . Bipolar disorder (HCC)   . Ectopic pregnancy   . GERD (gastroesophageal reflux disease)   . History of concussion 2016  . Hyperlipidemia   . Migraine   . MRSA (methicillin resistant Staphylococcus aureus)   . Pseudoseizures   . Scoliosis   . Scoliosis    Family History  Problem Relation Age of Onset  . Asthma Mother   . Heart disease Mother   . Diabetes Mother   . Hyperlipidemia Mother   . Hypertension Mother   . Mental illness Mother   . Migraines Mother   . Thyroid disease Mother   . Stroke Mother   . Hypertension Sister   . Cancer Maternal Grandmother   . Diabetes Maternal Grandmother   . Heart disease Maternal Grandmother   . Hyperlipidemia Maternal Grandmother   . Hypertension Maternal Grandmother   . Kidney disease Maternal Grandmother   . Heart disease Paternal Grandfather    Past Surgical History:  Procedure Laterality Date  . CARPAL TUNNEL RELEASE  2015   left  . HERNIA REPAIR  2010  . TUBAL LIGATION     Patient Active Problem List   Diagnosis Date  Noted  . Need for diphtheria-tetanus-pertussis (Tdap) vaccine, adult/adolescent 04/05/2015  . Abnormal weight gain 03/12/2015  . Encounter for medication monitoring 03/12/2015  . Abdominal pain, acute, right lower quadrant 03/02/2015  . Bipolar affective disorder (HCC) 01/26/2015  . Convulsion (HCC) 01/25/2015  . Seizures (HCC) 01/12/2015  . Asthma   . Hyperlipidemia   . Bipolar disorder (HCC)   . Allergy   . MRSA (methicillin resistant Staphylococcus aureus)   . Migraine headache with aura       Prior to Admission medications   Medication Sig Start Date End Date Taking? Authorizing Provider  cephALEXin (KEFLEX) 500 MG capsule Take 1 capsule (500 mg total) by mouth 3 (three) times daily. 11/18/16   Tommi Rumps, PA-C  cetirizine (ZYRTEC) 10 MG tablet Take 10 mg by mouth daily.    [provider]  divalproex (DEPAKOTE) 500 MG DR tablet Take 500 mg by mouth 2 (two) times daily.     [provider]  FLUoxetine (PROZAC) 20 MG tablet Take 20 mg by mouth daily.    [provider]  montelukast (SINGULAIR) 10 MG tablet Take 10 mg by mouth at bedtime.    [provider]  Multiple Vitamin (MULTIVITAMIN) tablet Take 1 tablet by mouth daily.    [provider]  ondansetron (ZOFRAN ODT) 4 MG disintegrating tablet Take 1  tablet (4 mg total) by mouth every 8 (eight) hours as needed for nausea or vomiting. 11/18/16   Tommi Rumps, PA-C  phenazopyridine (PYRIDIUM) 100 MG tablet Take 1 tablet (100 mg total) by mouth 3 (three) times daily as needed for pain. 11/18/16 11/18/17  Tommi Rumps, PA-C  traZODone (DESYREL) 50 MG tablet 50-100 mg at bedtime. 07/07/15   [provider]  vitamin B-12 (CYANOCOBALAMIN) 1000 MCG tablet Take 1,000 mcg by mouth daily.    [provider]  vitamin C (ASCORBIC ACID) 500 MG tablet Take 500 mg by mouth daily.    [provider]    Allergies Robitussin (alcohol free) [guaifenesin]; Aspirin;  Tramadol; and Codeine    Social History Social History  Substance Use Topics  . Smoking status: Current Every Day Smoker    Packs/day: 1.00    Types: Cigarettes  . Smokeless tobacco: Never Used  . Alcohol use No    Review of Systems Patient denies headaches, rhinorrhea, blurry vision, numbness, shortness of breath, chest pain, edema, cough, abdominal pain, nausea, vomiting, diarrhea, dysuria, fevers, rashes or hallucinations unless otherwise stated above in HPI. ____________________________________________   PHYSICAL EXAM:  VITAL SIGNS: Vitals:   01/15/17 0614  BP: 114/83  Pulse: 95  Resp: 20  Temp: 98.5 F (36.9 C)    Constitutional: Alert and oriented.laying with cover over head in dark room.  However, she is well appearing and in no acute distress. Eyes: Conjunctivae are normal.  Head: Atraumatic. Nose: No congestion/rhinnorhea. Mouth/Throat: Mucous membranes are moist.   Neck: No stridor. Painless ROM.  Cardiovascular: Normal rate, regular rhythm. Grossly normal heart sounds.  Good peripheral circulation. Respiratory: Normal respiratory effort.  No retractions. Lungs CTAB. Gastrointestinal: Soft and nontender. No distention. No abdominal bruits. No CVA tenderness. Genitourinary:  Musculoskeletal: No lower extremity tenderness nor edema.  No joint effusions. Neurologic: CN- intact.  No facial droop, Normal FNF.  Normal heel to shin.  Sensation intact bilaterally. Normal speech and language. No gross focal neurologic deficits are appreciated. No gait instability. Skin:  Skin is warm, dry and intact. No rash noted. Psychiatric: Mood and affect are normal. Speech and behavior are normal.  ____________________________________________   LABS (all labs ordered are listed, but only abnormal results are displayed)  Results for orders placed or performed during the hospital encounter of 01/15/17 (from the past 24 hour(s))  Lipase, blood     Status: None   Collection  Time: 01/15/17  6:21 AM  Result Value Ref Range   Lipase 26 11 - 51 U/L  Comprehensive metabolic panel     Status: Abnormal   Collection Time: 01/15/17  6:21 AM  Result Value Ref Range   Sodium 137 135 - 145 mmol/L   Potassium 4.0 3.5 - 5.1 mmol/L   Chloride 105 101 - 111 mmol/L   CO2 23 22 - 32 mmol/L   Glucose, Bld 102 (H) 65 - 99 mg/dL   BUN 9 6 - 20 mg/dL   Creatinine, Ser 1.61 0.44 - 1.00 mg/dL   Calcium 8.8 (L) 8.9 - 10.3 mg/dL   Total Protein 6.9 6.5 - 8.1 g/dL   Albumin 3.7 3.5 - 5.0 g/dL   AST 24 15 - 41 U/L   ALT 27 14 - 54 U/L   Alkaline Phosphatase 60 38 - 126 U/L   Total Bilirubin 0.4 0.3 - 1.2 mg/dL   GFR calc non Af Amer >60 >60 mL/min   GFR calc Af Amer >60 >60 mL/min  Anion gap 9 5 - 15  CBC     Status: None   Collection Time: 01/15/17  6:21 AM  Result Value Ref Range   WBC 10.0 3.6 - 11.0 K/uL   RBC 4.33 3.80 - 5.20 MIL/uL   Hemoglobin 12.7 12.0 - 16.0 g/dL   HCT 40.937.5 81.135.0 - 91.447.0 %   MCV 86.5 80.0 - 100.0 fL   MCH 29.3 26.0 - 34.0 pg   MCHC 33.9 32.0 - 36.0 g/dL   RDW 78.213.8 95.611.5 - 21.314.5 %   Platelets 265 150 - 440 K/uL   ____________________________________________ ____________________________________________  RADIOLOGY   ____________________________________________   PROCEDURES  Procedure(s) performed:  Procedures    Critical Care performed: no ____________________________________________   INITIAL IMPRESSION / ASSESSMENT AND PLAN / ED COURSE  Pertinent labs & imaging results that were available during my care of the patient were reviewed by me and considered in my medical decision making (see chart for details).  DDX: migraine, tension, cluster, dehydration  Niel HummerHeather M Brandon is a 31 y.o. who presents to the ED with HA for last 1 day. Not worst HA ever. Gradual onset.  Also complaining of watery diarrhea that is nonbloody. HA similar to previous episodes. Denies focal neurologic symptoms. Denies trauma. No fevers or neck pain. No vision  loss. Afebrile in ED. VSS. Exam as above. No meningeal signs. No CN, motor, sensory or cerebellar deficits. Temporal arteries palpable and non-tender. Appears well and non-toxic.  Donnell exam is soft and benign in all 4 quadrants.  Will provide IV fluids for hydration and IV medications for symptom control.  Likely tension, non-specific or possible migraine HA. Clinical picture is not consistent with ICH, SAH, SDH, EDH, TIA, or CVA. No concern for meningitis or encephalitis. No concern for GCA/Temporal arteritis.    ----------------------------------------- 8:12 AM on 01/15/2017 -----------------------------------------   Pain improved. Repeat neuro exam is again without focal deficit, nuchal rigidity or evidence of meningeal irritation.  Stable to D/C home, follow up with PCP or Neurology if persistent recurrent Has.  Have discussed with the patient and available family all diagnostics and treatments performed thus far and all questions were answered to the best of my ability. The patient demonstrates understanding and agreement with plan.    ____________________________________________   FINAL CLINICAL IMPRESSION(S) / ED DIAGNOSES  Final diagnoses:  Bad headache  Diarrhea, unspecified type      NEW MEDICATIONS STARTED DURING THIS VISIT:  New Prescriptions   No medications on file     Note:  This document was prepared using Dragon voice recognition software and may include unintentional dictation errors.    Willy Eddyobinson, Lira Stephen, MD 01/15/17 [phone]

## 2017-01-15 NOTE — Discharge Instructions (Signed)

## 2017-01-15 NOTE — ED Triage Notes (Signed)
Patient ambulatory to triage with steady gait, without difficulty or distress noted; pt reports frontal HA accomp by nausea & diarrhea since yesterday

## 2017-02-26 ENCOUNTER — Emergency Department
Admission: EM | Admit: 2017-02-26 | Discharge: 2017-02-26 | Disposition: A | Discharge status: Home / Self Care | Payer: Self-pay | Attending: Emergency Medicine | Admitting: Emergency Medicine

## 2017-02-26 DIAGNOSIS — F17210 Nicotine dependence, cigarettes, uncomplicated: Secondary | ICD-10-CM | POA: Insufficient documentation

## 2017-02-26 DIAGNOSIS — Z79899 Other long term (current) drug therapy: Secondary | ICD-10-CM | POA: Insufficient documentation

## 2017-02-26 DIAGNOSIS — M62830 Muscle spasm of back: Secondary | ICD-10-CM | POA: Insufficient documentation

## 2017-02-26 DIAGNOSIS — J45909 Unspecified asthma, uncomplicated: Secondary | ICD-10-CM | POA: Insufficient documentation

## 2017-02-26 LAB — POCT PREGNANCY, URINE: Preg Test, Ur: NEGATIVE

## 2017-02-26 MED ORDER — METHOCARBAMOL 500 MG PO TABS: ORAL_TABLET | ORAL | 0 refills | Status: DC

## 2017-02-26 MED ORDER — KETOROLAC TROMETHAMINE 30 MG/ML IJ SOLN
30.0000 mg | Freq: Once | INTRAMUSCULAR | Status: AC
  Administered 2017-02-26: 30 mg via INTRAMUSCULAR
  Filled 2017-02-26: qty 1

## 2017-02-26 MED ORDER — OXYCODONE-ACETAMINOPHEN 5-325 MG PO TABS
1.0000 | ORAL_TABLET | Freq: Once | ORAL | Status: AC
  Administered 2017-02-26: 1 via ORAL
  Filled 2017-02-26: qty 1

## 2017-02-26 MED ORDER — METHOCARBAMOL 500 MG PO TABS
1000.0000 mg | ORAL_TABLET | Freq: Once | ORAL | Status: AC
  Administered 2017-02-26: 1000 mg via ORAL
  Filled 2017-02-26: qty 2

## 2017-02-26 MED ORDER — OXYCODONE-ACETAMINOPHEN 5-325 MG PO TABS: 1.0000 | ORAL_TABLET | Freq: Four times a day (QID) | ORAL | 0 refills | Status: AC | PRN

## 2017-02-26 NOTE — Discharge Instructions (Signed)
Moist heat or ice to her back as needed for comfort. Continue taking Robaxin one or 2 tablets every 6 hours as needed for muscle spasms. Percocet one every 6 hours as needed for moderate pain. Follow-up with your primary care provider if any continued problems.

## 2017-02-26 NOTE — ED Notes (Signed)
See triage note  Presents with lower back pain   States pain started after "bad coughing" spell  Unable to lie still d/t pain

## 2017-02-26 NOTE — ED Provider Notes (Signed)
[facility] Emergency Department Provider Note  ___________________________________________   First MD Initiated Contact with Patient 02/26/17 1219     (approximate)  I have reviewed the triage vital signs and the nursing notes.   HISTORY  Chief Complaint Back Pain   HPI [name] is a 31 y.o. female  patient is brought in today by her mother with complaint of sudden severe low back pain. Patient states that she coughed one time this morning and felt a pulling sensation in her back which has continued to get worse today. Patient is not taking any over-the-counter medication for her pain. Currently she is unable to move without pain. She cannot find a comfortable position. She denies any paresthesias into her lower extremities. She denies any previous back problems. She rates her pain as an 8/10.   Past Medical History:  Diagnosis Date  . Allergy   . Asthma   . Bipolar 1 disorder (HCC)   . Bipolar disorder (HCC)   . Ectopic pregnancy   . GERD (gastroesophageal reflux disease)   . History of concussion 2016  . Hyperlipidemia   . Migraine   . MRSA (methicillin resistant Staphylococcus aureus)   . Pseudoseizures   . Scoliosis   . Scoliosis     Patient Active Problem List   Diagnosis Date Noted  . Need for diphtheria-tetanus-pertussis (Tdap) vaccine, adult/adolescent 04/05/2015  . Abnormal weight gain 03/12/2015  . Encounter for medication monitoring 03/12/2015  . Abdominal pain, acute, right lower quadrant 03/02/2015  . Bipolar affective disorder (HCC) 01/26/2015  . Convulsion (HCC) 01/25/2015  . Seizures (HCC) 01/12/2015  . Asthma   . Hyperlipidemia   . Bipolar disorder (HCC)   . Allergy   . MRSA (methicillin resistant Staphylococcus aureus)   . Migraine headache with aura     Past Surgical History:  Procedure Laterality Date  . CARPAL TUNNEL RELEASE  2015   left  . HERNIA REPAIR  2010  . TUBAL LIGATION      Prior to  Admission medications   Medication Sig Start Date End Date Taking? Authorizing Provider  divalproex (DEPAKOTE) 500 MG DR tablet Take 500 mg by mouth 2 (two) times daily.     [provider]  FLUoxetine (PROZAC) 20 MG tablet Take 20 mg by mouth daily.    [provider]  HYDROXYZINE PAMOATE PO Take by mouth 3 (three) times daily as needed.     [provider]  methocarbamol (ROBAXIN) 500 MG tablet 1-2 tablets every 6 hours prn muscles spasms 02/26/17   Tommi Rumps, PA-C  Multiple Vitamin (MULTIVITAMIN) tablet Take 1 tablet by mouth daily.    [provider]  oxyCODONE-acetaminophen (ROXICET) 5-325 MG tablet Take 1 tablet by mouth every 6 (six) hours as needed for moderate pain. 02/26/17 02/26/18  Tommi Rumps, PA-C  vitamin B-12 (CYANOCOBALAMIN) 1000 MCG tablet Take 1,000 mcg by mouth daily.    [provider]  vitamin C (ASCORBIC ACID) 500 MG tablet Take 500 mg by mouth daily.    [provider]    Allergies Robitussin (alcohol free) [guaifenesin]; Aspirin; Tramadol; and Codeine  Family History  Problem Relation Age of Onset  . Asthma Mother   . Heart disease Mother   . Diabetes Mother   . Hyperlipidemia Mother   . Hypertension Mother   . Mental illness Mother   . Migraines Mother   . Thyroid disease Mother   . Stroke Mother   . Hypertension Sister   .  Cancer Maternal Grandmother   . Diabetes Maternal Grandmother   . Heart disease Maternal Grandmother   . Hyperlipidemia Maternal Grandmother   . Hypertension Maternal Grandmother   . Kidney disease Maternal Grandmother   . Heart disease Paternal Grandfather     Social History Social History  Substance Use Topics  . Smoking status: Current Every Day Smoker    Packs/day: 1.00    Types: Cigarettes  . Smokeless tobacco: Never Used  . Alcohol use No    Review of Systems Constitutional: No fever/chills Cardiovascular: Denies chest pain. Respiratory: Denies shortness  of breath. Gastrointestinal: No abdominal pain.  No nausea, no vomiting. Genitourinary: Negative for dysuria. Musculoskeletal: Positive for low back pain. Skin: Negative for rash. Neurological: Negative for headaches, focal weakness or numbness. ____________________________________________   PHYSICAL EXAM:  VITAL SIGNS: ED Triage Vitals  Enc Vitals Group     BP 02/26/17 1159 123/80     Pulse Rate 02/26/17 1159 (!) 116     Resp 02/26/17 1159 18     Temp 02/26/17 1159 98.3 F (36.8 C)     Temp Source 02/26/17 1159 Oral     SpO2 02/26/17 1159 99 %     Weight 02/26/17 1200 179 lb (81.2 kg)     Height 02/26/17 1200 5\' 5"  (1.651 m)     Head Circumference --      Peak Flow --      Pain Score 02/26/17 1159 8     Pain Loc --      Pain Edu? --      Excl. in GC? --    Constitutional: Alert and oriented. Well appearing and in no acute distress. Eyes: Conjunctivae are normal. PERRL. EOMI. Head: Atraumatic. Neck: No stridor.   Cardiovascular: Normal rate, regular rhythm. Grossly normal heart sounds.  Good peripheral circulation. Respiratory: Normal respiratory effort.  No retractions. Lungs CTAB. Musculoskeletal: On examination the back there is no gross deformity however there is marked tenderness on palpation of the paravertebral muscles bilaterally. Patient is unable to move without severe pain. Exam is limited secondary to her inability to move. Reflexes were 2+ bilaterally with patient lying. No abrasions or ecchymosis was noted to suggest injury. Neurologic:  Normal speech and language. No gross focal neurologic deficits are appreciated.  Skin:  Skin is warm, dry and intact. No rash noted. Psychiatric: Mood and affect are normal. Speech and behavior are normal.  ____________________________________________   LABS (all labs ordered are listed, but only abnormal results are displayed)  Labs Reviewed  POC URINE PREG, ED  POCT PREGNANCY, URINE     PROCEDURES  Procedure(s)  performed: None  Procedures  Critical Care performed: No  ____________________________________________   INITIAL IMPRESSION / ASSESSMENT AND PLAN / ED COURSE  Pertinent labs & imaging results that were available during my care of the patient were reviewed by me and considered in my medical decision making (see chart for details).  Patient was given Toradol 30 mg IM along with Robaxin 1000 mg by mouth. Patient was then given Percocet 5 mg and was able to sit up comfortably and move upper extremities without aggravating her back. Patient will continue with muscle relaxants and pain medication. She is encouraged to use ice or heat to her back. She will follow-up with her PCP if any continued problems.    ___________________________________________   FINAL CLINICAL IMPRESSION(S) / ED DIAGNOSES  Final diagnoses:  Muscle spasm of back      NEW MEDICATIONS STARTED DURING THIS VISIT:  Discharge Medication List as of 02/26/2017  2:46 PM    START taking these medications   Details  methocarbamol (ROBAXIN) 500 MG tablet 1-2 tablets every 6 hours prn muscles spasms, Print    oxyCODONE-acetaminophen (ROXICET) 5-325 MG tablet Take 1 tablet by mouth every 6 (six) hours as needed for moderate pain., Starting Tue 02/26/2017, Until Wed 02/26/2018, Print         Note:  This document was prepared using Dragon voice recognition software and may include unintentional dictation errors.    Tommi RumpsSummers, Rhonda L, PA-C 02/26/17 1606    Nita SickleVeronese, [place], MD 02/27/17 [phone]

## 2017-02-26 NOTE — ED Triage Notes (Signed)
Pt states she had a cough spell this morning and had sudden onset lower back pain that has not gotten any better.

## 2017-04-07 ENCOUNTER — Encounter: Payer: Self-pay | Admitting: Emergency Medicine

## 2017-04-07 ENCOUNTER — Emergency Department
Admission: EM | Admit: 2017-04-07 | Discharge: 2017-04-07 | Disposition: A | Discharge status: Home / Self Care | Payer: Self-pay | Attending: Emergency Medicine | Admitting: Emergency Medicine

## 2017-04-07 DIAGNOSIS — J45909 Unspecified asthma, uncomplicated: Secondary | ICD-10-CM | POA: Insufficient documentation

## 2017-04-07 DIAGNOSIS — J069 Acute upper respiratory infection, unspecified: Secondary | ICD-10-CM | POA: Insufficient documentation

## 2017-04-07 DIAGNOSIS — F17210 Nicotine dependence, cigarettes, uncomplicated: Secondary | ICD-10-CM | POA: Insufficient documentation

## 2017-04-07 DIAGNOSIS — Z79899 Other long term (current) drug therapy: Secondary | ICD-10-CM | POA: Insufficient documentation

## 2017-04-07 LAB — POCT RAPID STREP A: STREPTOCOCCUS, GROUP A SCREEN (DIRECT): NEGATIVE

## 2017-04-07 MED ORDER — METHYLPREDNISOLONE 4 MG PO TBPK: ORAL_TABLET | ORAL | 0 refills | Status: DC

## 2017-04-07 MED ORDER — BENZONATATE 200 MG PO CAPS: 200.0000 mg | ORAL_CAPSULE | Freq: Three times a day (TID) | ORAL | 0 refills | Status: DC | PRN

## 2017-04-07 MED ORDER — AZITHROMYCIN 250 MG PO TABS: ORAL_TABLET | ORAL | 0 refills | Status: DC

## 2017-04-07 NOTE — ED Triage Notes (Signed)
FIRST NURSE NOTE-cough, pain to chest wall when coughing. X 1 week. NAD

## 2017-04-07 NOTE — ED Triage Notes (Signed)
Cough, sore throat x 7 days - no medications today

## 2017-04-07 NOTE — ED Provider Notes (Signed)
[facility] Emergency Department Provider Note  ____________________________________________   First MD Initiated Contact with Patient 04/07/17 1208     (approximate)  I have reviewed the triage vital signs and the nursing notes.   HISTORY  Chief Complaint Sore Throat    HPI [name] is a 31 y.o. female Complains of sore throat, cough, and chest pain with cough for 1 week. Denies fever or chills. States that she she'll of green mucus out when she coughs. Has had some wheezing. Mostly at night. Denies vomiting or diarrhea.patient is a smoker   Past Medical History:  Diagnosis Date  . Allergy   . Asthma   . Bipolar 1 disorder (HCC)   . Bipolar disorder (HCC)   . Ectopic pregnancy   . GERD (gastroesophageal reflux disease)   . History of concussion 2016  . Hyperlipidemia   . Migraine   . MRSA (methicillin resistant Staphylococcus aureus)   . Pseudoseizures   . Scoliosis   . Scoliosis     Patient Active Problem List   Diagnosis Date Noted  . Need for diphtheria-tetanus-pertussis (Tdap) vaccine, adult/adolescent 04/05/2015  . Abnormal weight gain 03/12/2015  . Encounter for medication monitoring 03/12/2015  . Abdominal pain, acute, right lower quadrant 03/02/2015  . Bipolar affective disorder (HCC) 01/26/2015  . Convulsion (HCC) 01/25/2015  . Seizures (HCC) 01/12/2015  . Asthma   . Hyperlipidemia   . Bipolar disorder (HCC)   . Allergy   . MRSA (methicillin resistant Staphylococcus aureus)   . Migraine headache with aura     Past Surgical History:  Procedure Laterality Date  . CARPAL TUNNEL RELEASE  2015   left  . HERNIA REPAIR  2010  . TUBAL LIGATION      Prior to Admission medications   Medication Sig Start Date End Date Taking? Authorizing Provider  azithromycin (ZITHROMAX Z-PAK) 250 MG tablet 2 pills today then 1 pill a day for 4 days 04/07/17   Sherrie Mustache Roselyn Bering, PA-C  benzonatate (TESSALON) 200 MG capsule Take 1  capsule (200 mg total) by mouth 3 (three) times daily as needed for cough. 04/07/17   Amaya Blakeman, Roselyn Bering, PA-C  divalproex (DEPAKOTE) 500 MG DR tablet Take 500 mg by mouth 2 (two) times daily.     [provider]  FLUoxetine (PROZAC) 20 MG tablet Take 20 mg by mouth daily.    [provider]  HYDROXYZINE PAMOATE PO Take by mouth 3 (three) times daily as needed.     [provider]  methocarbamol (ROBAXIN) 500 MG tablet 1-2 tablets every 6 hours prn muscles spasms 02/26/17   Tommi Rumps, PA-C  methylPREDNISolone (MEDROL DOSEPAK) 4 MG TBPK tablet Take 6 pills on day one then decrease by 1 pill each day 04/07/17   Faythe Ghee, PA-C  Multiple Vitamin (MULTIVITAMIN) tablet Take 1 tablet by mouth daily.    [provider]  oxyCODONE-acetaminophen (ROXICET) 5-325 MG tablet Take 1 tablet by mouth every 6 (six) hours as needed for moderate pain. 02/26/17 02/26/18  Tommi Rumps, PA-C  vitamin B-12 (CYANOCOBALAMIN) 1000 MCG tablet Take 1,000 mcg by mouth daily.    [provider]  vitamin C (ASCORBIC ACID) 500 MG tablet Take 500 mg by mouth daily.    [provider]    Allergies Robitussin (alcohol free) [guaifenesin]; Aspirin; Tramadol; and Codeine  Family History  Problem Relation Age of Onset  . Asthma Mother   . Heart disease Mother   .  Diabetes Mother   . Hyperlipidemia Mother   . Hypertension Mother   . Mental illness Mother   . Migraines Mother   . Thyroid disease Mother   . Stroke Mother   . Hypertension Sister   . Cancer Maternal Grandmother   . Diabetes Maternal Grandmother   . Heart disease Maternal Grandmother   . Hyperlipidemia Maternal Grandmother   . Hypertension Maternal Grandmother   . Kidney disease Maternal Grandmother   . Heart disease Paternal Grandfather     Social History Social History  Substance Use Topics  . Smoking status: Current Every Day Smoker    Packs/day: 1.00    Types: Cigarettes  . Smokeless  tobacco: Never Used  . Alcohol use No    Review of Systems  Constitutional: No fever/chills Eyes: No visual changes. ENT: positive sore throat.Suprane nose and congestion Respiratory: positive cough Genitourinary: Negative for dysuria. Musculoskeletal: Negative for back pain. Skin: Negative for rash.    ____________________________________________   PHYSICAL EXAM:  VITAL SIGNS: ED Triage Vitals  Enc Vitals Group     BP 04/07/17 1129 111/83     Pulse Rate 04/07/17 1129 100     Resp 04/07/17 1129 18     Temp 04/07/17 1129 98.7 F (37.1 C)     Temp Source 04/07/17 1129 Oral     SpO2 04/07/17 1129 97 %     Weight 04/07/17 1129 194 lb (88 kg)     Height 04/07/17 1129  (1.651 m)     Head Circumference --      Peak Flow --      Pain Score 04/07/17 1128 6     Pain Loc --      Pain Edu? --      Excl. in GC? --     Constitutional: Alert and oriented. Well appearing and in no acute distress. Eyes: Conjunctivae are normal.  Head: Atraumatic. Nose: No congestion/rhinnorhea. Mouth/Throat: Mucous membranes are moist. Throat is irritated  Cardiovascular: Normal rate, regular rhythm. Respiratory: Normal respiratory effort.  No retractions. Lungs are clear to auscultation.  Cough is deep and harsh. GU: deferred Musculoskeletal: FROM all extremities, warm and well perfused Neurologic:  Normal speech and language.  Skin:  Skin is warm, dry and intact. No rash noted. Psychiatric: Mood and affect are normal. Speech and behavior are normal.  ____________________________________________   LABS (all labs ordered are listed, but only abnormal results are displayed)  Labs Reviewed  POCT RAPID STREP A   ____________________________________________   ____________________________________________  RADIOLOGY    ____________________________________________   PROCEDURES  Procedure(s) performed: No      ____________________________________________   INITIAL  IMPRESSION / ASSESSMENT AND PLAN / ED COURSE  Pertinent labs & imaging results that were available during my care of the patient were reviewed by me and considered in my medical decision making (see chart for details).  Patient is 31 year old ealthy female. Diagnosed with acute upper respiratory infection. Gave prescription for Z-Pak, Tessalon Perles, and steroid pack. Patient was instructed to follow-up with her doctor if not better in 5 days. She is to return to the emergency room  if she is worsening      ____________________________________________   FINAL CLINICAL IMPRESSION(S) / ED DIAGNOSES  Final diagnoses:  Acute URI      NEW MEDICATIONS STARTED DURING THIS VISIT:  Discharge Medication List as of 04/07/2017 12:16 PM    START taking these medications   Details  azithromycin (ZITHROMAX Z-PAK) 250 MG tablet 2 pills  today then 1 pill a day for 4 days, Print    benzonatate (TESSALON) 200 MG capsule Take 1 capsule (200 mg total) by mouth 3 (three) times daily as needed for cough., Starting Sun 04/07/2017, Print    methylPREDNISolone (MEDROL DOSEPAK) 4 MG TBPK tablet Take 6 pills on day one then decrease by 1 pill each day, Print         Note:  This document was prepared using Dragon voice recognition software and may include unintentional dictation errors.    Faythe Ghee, PA-C 04/07/17 1357    Jene Every, MD 04/07/17 [phone]

## 2017-04-07 NOTE — Discharge Instructions (Signed)
Take medications as prescribed. Follow-up with your regular doctor if not better in 3-5 days. Return to the emergency room if you are worsening with the cough chest pain or shortness breath

## 2017-07-01 ENCOUNTER — Other Ambulatory Visit: Payer: Self-pay

## 2017-07-01 ENCOUNTER — Emergency Department
Admission: EM | Admit: 2017-07-01 | Discharge: 2017-07-01 | Disposition: A | Discharge status: Home / Self Care | Payer: Self-pay | Attending: Emergency Medicine | Admitting: Emergency Medicine

## 2017-07-01 DIAGNOSIS — R05 Cough: Secondary | ICD-10-CM | POA: Insufficient documentation

## 2017-07-01 DIAGNOSIS — J019 Acute sinusitis, unspecified: Secondary | ICD-10-CM | POA: Insufficient documentation

## 2017-07-01 DIAGNOSIS — J45909 Unspecified asthma, uncomplicated: Secondary | ICD-10-CM | POA: Insufficient documentation

## 2017-07-01 DIAGNOSIS — F17210 Nicotine dependence, cigarettes, uncomplicated: Secondary | ICD-10-CM | POA: Insufficient documentation

## 2017-07-01 DIAGNOSIS — Z79899 Other long term (current) drug therapy: Secondary | ICD-10-CM | POA: Insufficient documentation

## 2017-07-01 DIAGNOSIS — J040 Acute laryngitis: Secondary | ICD-10-CM | POA: Insufficient documentation

## 2017-07-01 MED ORDER — AMOXICILLIN-POT CLAVULANATE 875-125 MG PO TABS: 1.0000 | ORAL_TABLET | Freq: Two times a day (BID) | ORAL | 0 refills | Status: AC

## 2017-07-01 MED ORDER — PREDNISONE 10 MG PO TABS: ORAL_TABLET | ORAL | 0 refills | Status: DC

## 2017-07-01 NOTE — ED Notes (Signed)
See triage note  Presents with sinus pressure and cough for about 1 week

## 2017-07-01 NOTE — ED Provider Notes (Signed)
[facility] Emergency Department Provider Note  ____________________________________________  Time seen: Approximately 6:39 PM  I have reviewed the triage vital signs and the nursing notes.   HISTORY  Chief Complaint Cough    HPI [name] is a 32 y.o. female that presents emergency department for evaluation of nasal congestion for 1 week and hoarse voice for 4 days. Nasal drainage is causing patient to cough today.  She states that it feels like her throat is irritated which is causing her to cough.  She has not had a fever but has felt warm.  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  Several members of her family are sick.  She smokes 1/2 pack of cigarettes per day.    Past Medical History:  Diagnosis Date  . Allergy   . Asthma   . Bipolar 1 disorder (HCC)   . Bipolar disorder (HCC)   . Ectopic pregnancy   . GERD (gastroesophageal reflux disease)   . History of concussion 2016  . Hyperlipidemia   . Migraine   . MRSA (methicillin resistant Staphylococcus aureus)   . Pseudoseizures   . Scoliosis   . Scoliosis     Patient Active Problem List   Diagnosis Date Noted  . Need for diphtheria-tetanus-pertussis (Tdap) vaccine, adult/adolescent 04/05/2015  . Abnormal weight gain 03/12/2015  . Encounter for medication monitoring 03/12/2015  . Abdominal pain, acute, right lower quadrant 03/02/2015  . Bipolar affective disorder (HCC) 01/26/2015  . Convulsion (HCC) 01/25/2015  . Seizures (HCC) 01/12/2015  . Asthma   . Hyperlipidemia   . Bipolar disorder (HCC)   . Allergy   . MRSA (methicillin resistant Staphylococcus aureus)   . Migraine headache with aura     Past Surgical History:  Procedure Laterality Date  . CARPAL TUNNEL RELEASE  2015   left  . HERNIA REPAIR  2010  . TUBAL LIGATION      Prior to Admission medications   Medication Sig Start Date End Date Taking? Authorizing Provider  amoxicillin-clavulanate (AUGMENTIN)  875-125 MG tablet Take 1 tablet by mouth 2 (two) times daily for 10 days. 07/01/17 07/11/17  Enid Derry, PA-C  azithromycin (ZITHROMAX Z-PAK) 250 MG tablet 2 pills today then 1 pill a day for 4 days 04/07/17   Sherrie Mustache Roselyn Bering, PA-C  benzonatate (TESSALON) 200 MG capsule Take 1 capsule (200 mg total) by mouth 3 (three) times daily as needed for cough. 04/07/17   Fisher, Roselyn Bering, PA-C  divalproex (DEPAKOTE) 500 MG DR tablet Take 500 mg by mouth 2 (two) times daily.     [provider]  FLUoxetine (PROZAC) 20 MG tablet Take 20 mg by mouth daily.    [provider]  HYDROXYZINE PAMOATE PO Take by mouth 3 (three) times daily as needed.     [provider]  methocarbamol (ROBAXIN) 500 MG tablet 1-2 tablets every 6 hours prn muscles spasms 02/26/17   Tommi Rumps, PA-C  methylPREDNISolone (MEDROL DOSEPAK) 4 MG TBPK tablet Take 6 pills on day one then decrease by 1 pill each day 04/07/17   Faythe Ghee, PA-C  Multiple Vitamin (MULTIVITAMIN) tablet Take 1 tablet by mouth daily.    [provider]  oxyCODONE-acetaminophen (ROXICET) 5-325 MG tablet Take 1 tablet by mouth every 6 (six) hours as needed for moderate pain. 02/26/17 02/26/18  Tommi Rumps, PA-C  predniSONE (DELTASONE) 10 MG tablet Take 6 tablets on day 1, take 5 tablets on day  2, take 4 tablets on day 3, take 3 tablets on day 4, take 2 tablets on day 5, take 1 tablet on day 6 07/01/17   Enid DerryWagner, Myana Schlup, PA-C  vitamin B-12 (CYANOCOBALAMIN) 1000 MCG tablet Take 1,000 mcg by mouth daily.    [provider]  vitamin C (ASCORBIC ACID) 500 MG tablet Take 500 mg by mouth daily.    [provider]    Allergies Robitussin (alcohol free) [guaifenesin]; Aspirin; Tramadol; and Codeine  Family History  Problem Relation Age of Onset  . Asthma Mother   . Heart disease Mother   . Diabetes Mother   . Hyperlipidemia Mother   . Hypertension Mother   . Mental illness Mother   . Migraines Mother   .  Thyroid disease Mother   . Stroke Mother   . Hypertension Sister   . Cancer Maternal Grandmother   . Diabetes Maternal Grandmother   . Heart disease Maternal Grandmother   . Hyperlipidemia Maternal Grandmother   . Hypertension Maternal Grandmother   . Kidney disease Maternal Grandmother   . Heart disease Paternal Grandfather     Social History Social History   Tobacco Use  . Smoking status: Current Every Day Smoker    Packs/day: 1.00    Types: Cigarettes  . Smokeless tobacco: Never Used  Substance Use Topics  . Alcohol use: No    Alcohol/week: 0.0 oz  . Drug use: No     Review of Systems  Constitutional: No fever/chills ENT: Positive for congestion and rhinorrhea.  Cardiovascular: No chest pain. Respiratory: Positive for cough. No SOB. Gastrointestinal: No abdominal pain.  No nausea, no vomiting.  No diarrhea.  No constipation. Musculoskeletal: Negative for musculoskeletal pain. Skin: Negative for rash, abrasions, lacerations, ecchymosis. Neurological: Negative for headaches.   ____________________________________________   PHYSICAL EXAM:  VITAL SIGNS: ED Triage Vitals  Enc Vitals Group     BP 07/01/17 1818 117/89     Pulse Rate 07/01/17 1818 89     Resp 07/01/17 1818 20     Temp 07/01/17 1818 98.5 F (36.9 C)     Temp Source 07/01/17 1818 Oral     SpO2 07/01/17 1818 97 %     Weight 07/01/17 1819 175 lb (79.4 kg)     Height 07/01/17 1819 5\' 5"  (1.651 m)     Head Circumference --      Peak Flow --      Pain Score 07/01/17 1818 6     Pain Loc --      Pain Edu? --      Excl. in GC? --      Constitutional: Alert and oriented. Well appearing and in no acute distress.  Hoarse voice. Eyes: Conjunctivae are normal. PERRL. EOMI. No discharge. Head: Atraumatic. ENT: No frontal and maxillary sinus tenderness.      Ears: Tympanic membranes pearly gray with good landmarks. No discharge.      Nose: Mild congestion/rhinnorhea.      Mouth/Throat: Mucous membranes  are moist. Oropharynx non-erythematous. Tonsils not enlarged. No exudates. Uvula midline. Neck: No stridor.   Hematological/Lymphatic/Immunilogical: No cervical lymphadenopathy. Cardiovascular: Normal rate, regular rhythm.  Good peripheral circulation. Respiratory: Normal respiratory effort without tachypnea or retractions. Lungs CTAB. Good air entry to the bases with no decreased or absent breath sounds. Gastrointestinal: Bowel sounds 4 quadrants. Soft and nontender to palpation. No guarding or rigidity. No palpable masses. No distention. Musculoskeletal: Full range of motion to all extremities. No gross deformities appreciated. Neurologic:  Normal  speech and language. No gross focal neurologic deficits are appreciated.  Skin:  Skin is warm, dry and intact. No rash noted. Psychiatric: Mood and affect are normal. Speech and behavior are normal. Patient exhibits appropriate insight and judgement.   ____________________________________________   LABS (all labs ordered are listed, but only abnormal results are displayed)  Labs Reviewed - No data to display ____________________________________________  EKG   ____________________________________________  RADIOLOGY   No results found.  ____________________________________________    PROCEDURES  Procedure(s) performed:    Procedures    Medications - No data to display   ____________________________________________   INITIAL IMPRESSION / ASSESSMENT AND PLAN / ED COURSE  Pertinent labs & imaging results that were available during my care of the patient were reviewed by me and considered in my medical decision making (see chart for details).  Review of the [place] CSRS was performed in accordance of the [facility] prior to dispensing any controlled drugs.     Patient's diagnosis is consistent with sinus infection and laryngitis. Vital signs and exam are reassuring. Patient appears well and is staying well hydrated. Patient feels  comfortable going home. Patient will be discharged home with prescriptions for Augmentin and prednisone. Patient is to follow up with PCP as needed or otherwise directed. Patient is given ED precautions to return to the ED for any worsening or new symptoms.     ____________________________________________  FINAL CLINICAL IMPRESSION(S) / ED DIAGNOSES  Final diagnoses:  Acute non-recurrent sinusitis, unspecified location  Laryngitis      NEW MEDICATIONS STARTED DURING THIS VISIT:  ED Discharge Orders        Ordered    amoxicillin-clavulanate (AUGMENTIN) 875-125 MG tablet  2 times daily     07/01/17 1842    predniSONE (DELTASONE) 10 MG tablet     07/01/17 1842          This chart was dictated using voice recognition software/Dragon. Despite best efforts to proofread, errors can occur which can change the meaning. Any change was purely unintentional.    Enid Derry, PA-C 07/01/17 1847    Jene Every, MD 07/01/17 [phone]

## 2017-07-01 NOTE — ED Triage Notes (Signed)
Cough x 1 day, nasal congestion x 1 week.

## 2017-07-23 ENCOUNTER — Emergency Department
Admission: EM | Admit: 2017-07-23 | Discharge: 2017-07-23 | Disposition: A | Discharge status: Home / Self Care | Payer: Self-pay | Attending: Emergency Medicine | Admitting: Emergency Medicine

## 2017-07-23 ENCOUNTER — Other Ambulatory Visit: Payer: Self-pay

## 2017-07-23 ENCOUNTER — Encounter: Payer: Self-pay | Admitting: Emergency Medicine

## 2017-07-23 DIAGNOSIS — F17210 Nicotine dependence, cigarettes, uncomplicated: Secondary | ICD-10-CM | POA: Insufficient documentation

## 2017-07-23 DIAGNOSIS — J45909 Unspecified asthma, uncomplicated: Secondary | ICD-10-CM | POA: Insufficient documentation

## 2017-07-23 DIAGNOSIS — M419 Scoliosis, unspecified: Secondary | ICD-10-CM | POA: Insufficient documentation

## 2017-07-23 DIAGNOSIS — R51 Headache: Secondary | ICD-10-CM | POA: Insufficient documentation

## 2017-07-23 DIAGNOSIS — R519 Headache, unspecified: Secondary | ICD-10-CM

## 2017-07-23 DIAGNOSIS — F319 Bipolar disorder, unspecified: Secondary | ICD-10-CM | POA: Insufficient documentation

## 2017-07-23 MED ORDER — KETOROLAC TROMETHAMINE 10 MG PO TABS: 10.0000 mg | ORAL_TABLET | Freq: Three times a day (TID) | ORAL | 0 refills | Status: DC | PRN

## 2017-07-23 MED ORDER — KETOROLAC TROMETHAMINE 60 MG/2ML IM SOLN
INTRAMUSCULAR | Status: AC
  Administered 2017-07-23: 60 mg via INTRAMUSCULAR
  Filled 2017-07-23: qty 2

## 2017-07-23 MED ORDER — PROCHLORPERAZINE MALEATE 10 MG PO TABS
10.0000 mg | ORAL_TABLET | Freq: Once | ORAL | Status: DC
  Filled 2017-07-23 (×2): qty 1

## 2017-07-23 MED ORDER — DIPHENHYDRAMINE HCL 25 MG PO CAPS
ORAL_CAPSULE | ORAL | Status: AC
  Administered 2017-07-23: 25 mg via ORAL
  Filled 2017-07-23: qty 1

## 2017-07-23 MED ORDER — KETOROLAC TROMETHAMINE 60 MG/2ML IM SOLN
60.0000 mg | Freq: Once | INTRAMUSCULAR | Status: AC
  Administered 2017-07-23: 60 mg via INTRAMUSCULAR

## 2017-07-23 MED ORDER — DIPHENHYDRAMINE HCL 25 MG PO CAPS
25.0000 mg | ORAL_CAPSULE | Freq: Once | ORAL | Status: AC
  Administered 2017-07-23: 25 mg via ORAL

## 2017-07-23 NOTE — ED Notes (Signed)
Pt c/o HA since earlier today and numb and tingling hands. Pt is NAD at this time.

## 2017-07-23 NOTE — Discharge Instructions (Signed)
Please drink plenty water to stay well-hydrated.  You may take Tylenol or Toradol for your headache.  Compazine is for nausea.  If you are taking Toradol, do not take any other NSAID medications including Aleve, ibuprofen, Motrin, or Advil.  Take Toradol with a small amount of food to prevent irritation of your stomach.  Return to the emergency department if you develop severe pain, lightheadedness or fainting, vomiting, fever, or any other symptoms concerning to you.

## 2017-07-23 NOTE — ED Provider Notes (Signed)
[facility] Emergency Department Provider Note  ____________________________________________  Time seen: Approximately 10:12 PM  I have reviewed the triage vital signs and the nursing notes.   HISTORY  Chief Complaint Headache    HPI Niel HummerHeather M Ziehm is a 32 y.o. female with a history of migraines, bipolar disorder, pseudoseizures, presenting with headache.  It is a dull ache in the back of her head.  The patient reports that for the last week or so, she has been having a headache with some mild nasal congestion.  She has not had any fevers, nausea or vomiting, neck stiffness, visual changes, numbness tingling or weakness.  She has tried some over-the-counter medications without any improvement.  Past Medical History:  Diagnosis Date  . Allergy   . Asthma   . Bipolar 1 disorder (HCC)   . Bipolar disorder (HCC)   . Ectopic pregnancy   . GERD (gastroesophageal reflux disease)   . History of concussion 2016  . Hyperlipidemia   . Migraine   . MRSA (methicillin resistant Staphylococcus aureus)   . Pseudoseizures   . Scoliosis   . Scoliosis     Patient Active Problem List   Diagnosis Date Noted  . Need for diphtheria-tetanus-pertussis (Tdap) vaccine, adult/adolescent 04/05/2015  . Abnormal weight gain 03/12/2015  . Encounter for medication monitoring 03/12/2015  . Abdominal pain, acute, right lower quadrant 03/02/2015  . Bipolar affective disorder (HCC) 01/26/2015  . Convulsion (HCC) 01/25/2015  . Seizures (HCC) 01/12/2015  . Asthma   . Hyperlipidemia   . Bipolar disorder (HCC)   . Allergy   . MRSA (methicillin resistant Staphylococcus aureus)   . Migraine headache with aura     Past Surgical History:  Procedure Laterality Date  . CARPAL TUNNEL RELEASE  2015   left  . HERNIA REPAIR  2010  . TUBAL LIGATION      Current Outpatient Rx  . Order #: [id number] Class: Print  . Order #: [id number] Class: Print  . Order #: [id number] Class: Historical  Med  . Order #: [id number] Class: Historical Med  . Order #: [id number] Class: Historical Med  . Order #: [id number] Class: Print  . Order #: [id number] Class: Print  . Order #: [id number] Class: Print  . Order #: [id number] Class: Historical Med  . Order #: [id number] Class: Print  . Order #: [id number] Class: Print  . Order #: [id number] Class: Historical Med  . Order #: [id number] Class: Historical Med    Allergies Robitussin (alcohol free) [guaifenesin]; Aspirin; Tramadol; and Codeine  Family History  Problem Relation Age of Onset  . Asthma Mother   . Heart disease Mother   . Diabetes Mother   . Hyperlipidemia Mother   . Hypertension Mother   . Mental illness Mother   . Migraines Mother   . Thyroid disease Mother   . Stroke Mother   . Hypertension Sister   . Cancer Maternal Grandmother   . Diabetes Maternal Grandmother   . Heart disease Maternal Grandmother   . Hyperlipidemia Maternal Grandmother   . Hypertension Maternal Grandmother   . Kidney disease Maternal Grandmother   . Heart disease Paternal Grandfather     Social History Social History   Tobacco Use  . Smoking status: Current Every Day Smoker    Packs/day: 1.00    Types: Cigarettes  . Smokeless tobacco: Never Used  Substance Use Topics  . Alcohol use: No    Alcohol/week: 0.0 oz  . Drug use: No    Review of Systems Constitutional: No fever/chills.  No lightheadedness or syncope. Eyes: No  visual changes. ENT: No sore throat.  Positive for nasal congestion no neck pain or stiffness. Cardiovascular: Denies chest pain. Denies palpitations. Respiratory: Denies shortness of breath.  No cough. Gastrointestinal: No abdominal pain.  No nausea, no vomiting.  No diarrhea.  No constipation. Genitourinary: Negative for dysuria. Musculoskeletal: Negative for back pain. Skin: Negative for rash. Neurological: Positive for headaches. No focal numbness, tingling or weakness.      ____________________________________________   PHYSICAL EXAM:  VITAL SIGNS: ED Triage Vitals  Enc Vitals Group     BP 07/23/17 1754 127/79     Pulse Rate 07/23/17 1754 (!) 109     Resp 07/23/17 1754 20     Temp 07/23/17 1754 98.5 F (36.9 C)     Temp src --      SpO2 07/23/17 1754 99 %     Weight 07/23/17 1753 172 lb (78 kg)     Height --      Head Circumference --      Peak Flow --      Pain Score 07/23/17 1753 8     Pain Loc --      Pain Edu? --      Excl. in GC? --     Constitutional: Alert and oriented. Well appearing and in no acute distress. Answers questions appropriately.  The patient is resting comfortably in the stretcher, she intermittently plays on her phone and is able to move about without any difficulty. Eyes: Conjunctivae are normal.  EOMI. PERRLA.  No vertical or horizontal nystagmus.  No eye discharge.  No scleral icterus. Head: Atraumatic.  No significant tenderness over the frontal or maxillary sinuses. Nose: Mild congestion without ongoing rhinorrhea. Mouth/Throat: Mucous membranes are moist.  Neck: No stridor.  Supple.  No JVD.  No meningismus. Cardiovascular: Normal rate, regular rhythm. No murmurs, rubs or gallops.  Respiratory: Normal respiratory effort.  No accessory muscle use or retractions. Lungs CTAB.  No wheezes, rales or ronchi. Musculoskeletal: Moves all extremities well. Neurologic:  A&Ox3.  Speech is clear.  Face and smile are symmetric.  EOMI. PERRLA.  No vertical or horizontal nystagmus.  Moves all extremities well.  Normal gait without ataxia. Skin:  Skin is warm, dry and intact. No rash noted. Psychiatric: Mood and affect are normal. Speech and behavior are normal.  Normal judgement.  ____________________________________________   LABS (all labs ordered are listed, but only abnormal results are displayed)  Labs Reviewed - No data to display ____________________________________________  EKG  Not  indicated ____________________________________________  RADIOLOGY  No results found.  ____________________________________________   PROCEDURES  Procedure(s) performed: None  Procedures  Critical Care performed: No ____________________________________________   INITIAL IMPRESSION / ASSESSMENT AND PLAN / ED COURSE  Pertinent labs & imaging results that were available during my care of the patient were reviewed by me and considered in my medical decision making (see chart for details).  32 y.o. female with a history of migraines presenting with 1 week of mild nasal congestion with headache.  Overall, the patient is well-appearing and afebrile on my examination.  She has no focal neurologic deficits.  We will plan to treat her symptomatically, and reevaluate her for final disposition.  I do not suspect any acute emergent pathology including intracranial bleeding, subarachnoid hemorrhage, or meningitis.  ----------------------------------------- 10:16 PM on 07/23/2017 -----------------------------------------  At this time, the patient's headache has significantly improved and she is able to tolerate liquids without any difficulty.  She remains afebrile.  We will plan to discharge the  patient home with close PMD follow-up.  Return precautions were discussed.  ____________________________________________  FINAL CLINICAL IMPRESSION(S) / ED DIAGNOSES  Final diagnoses:  Acute nonintractable headache, unspecified headache type         NEW MEDICATIONS STARTED DURING THIS VISIT:  New Prescriptions   KETOROLAC (TORADOL) 10 MG TABLET    Take 1 tablet (10 mg total) by mouth every 8 (eight) hours as needed for moderate pain (with food).      Rockne Menghini, MD 07/23/17 2217

## 2017-07-23 NOTE — ED Triage Notes (Signed)
Pt to ed via ems with reports of headache. Ems reports all VSS>

## 2017-09-03 IMAGING — CR DG CHEST 2V
1 series · 2 of 2 positions shown · non-contrast
Comparison: 08/09/2015

CLINICAL DATA: Cough for 1 week.  Nonproductive cough.  Pleurisy.

EXAM:
CHEST  2 VIEW

[Series 1: dg chest 2 view · 0.14mm/px · 2 of 2 slices shown]
[im 1/2]
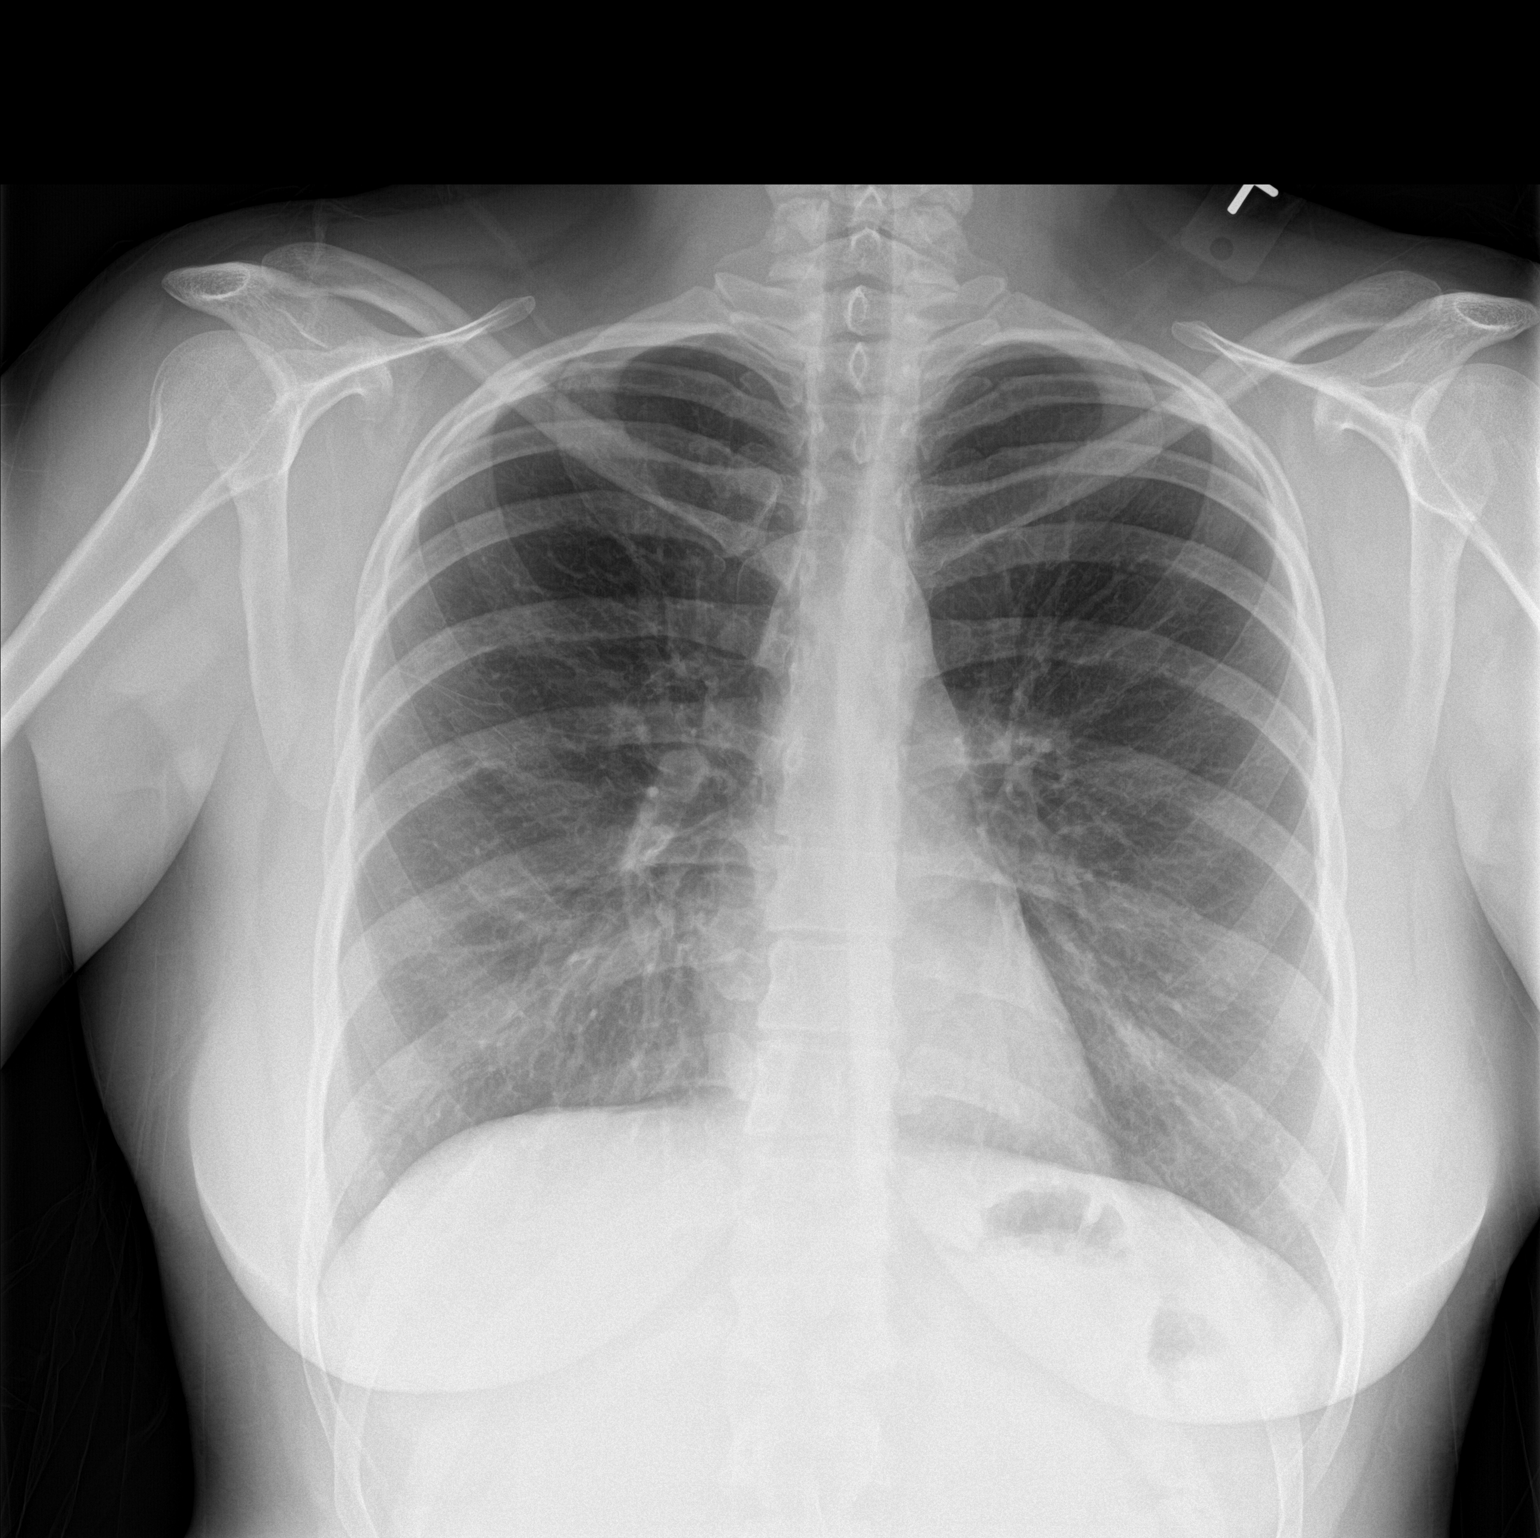
[im 2/2]
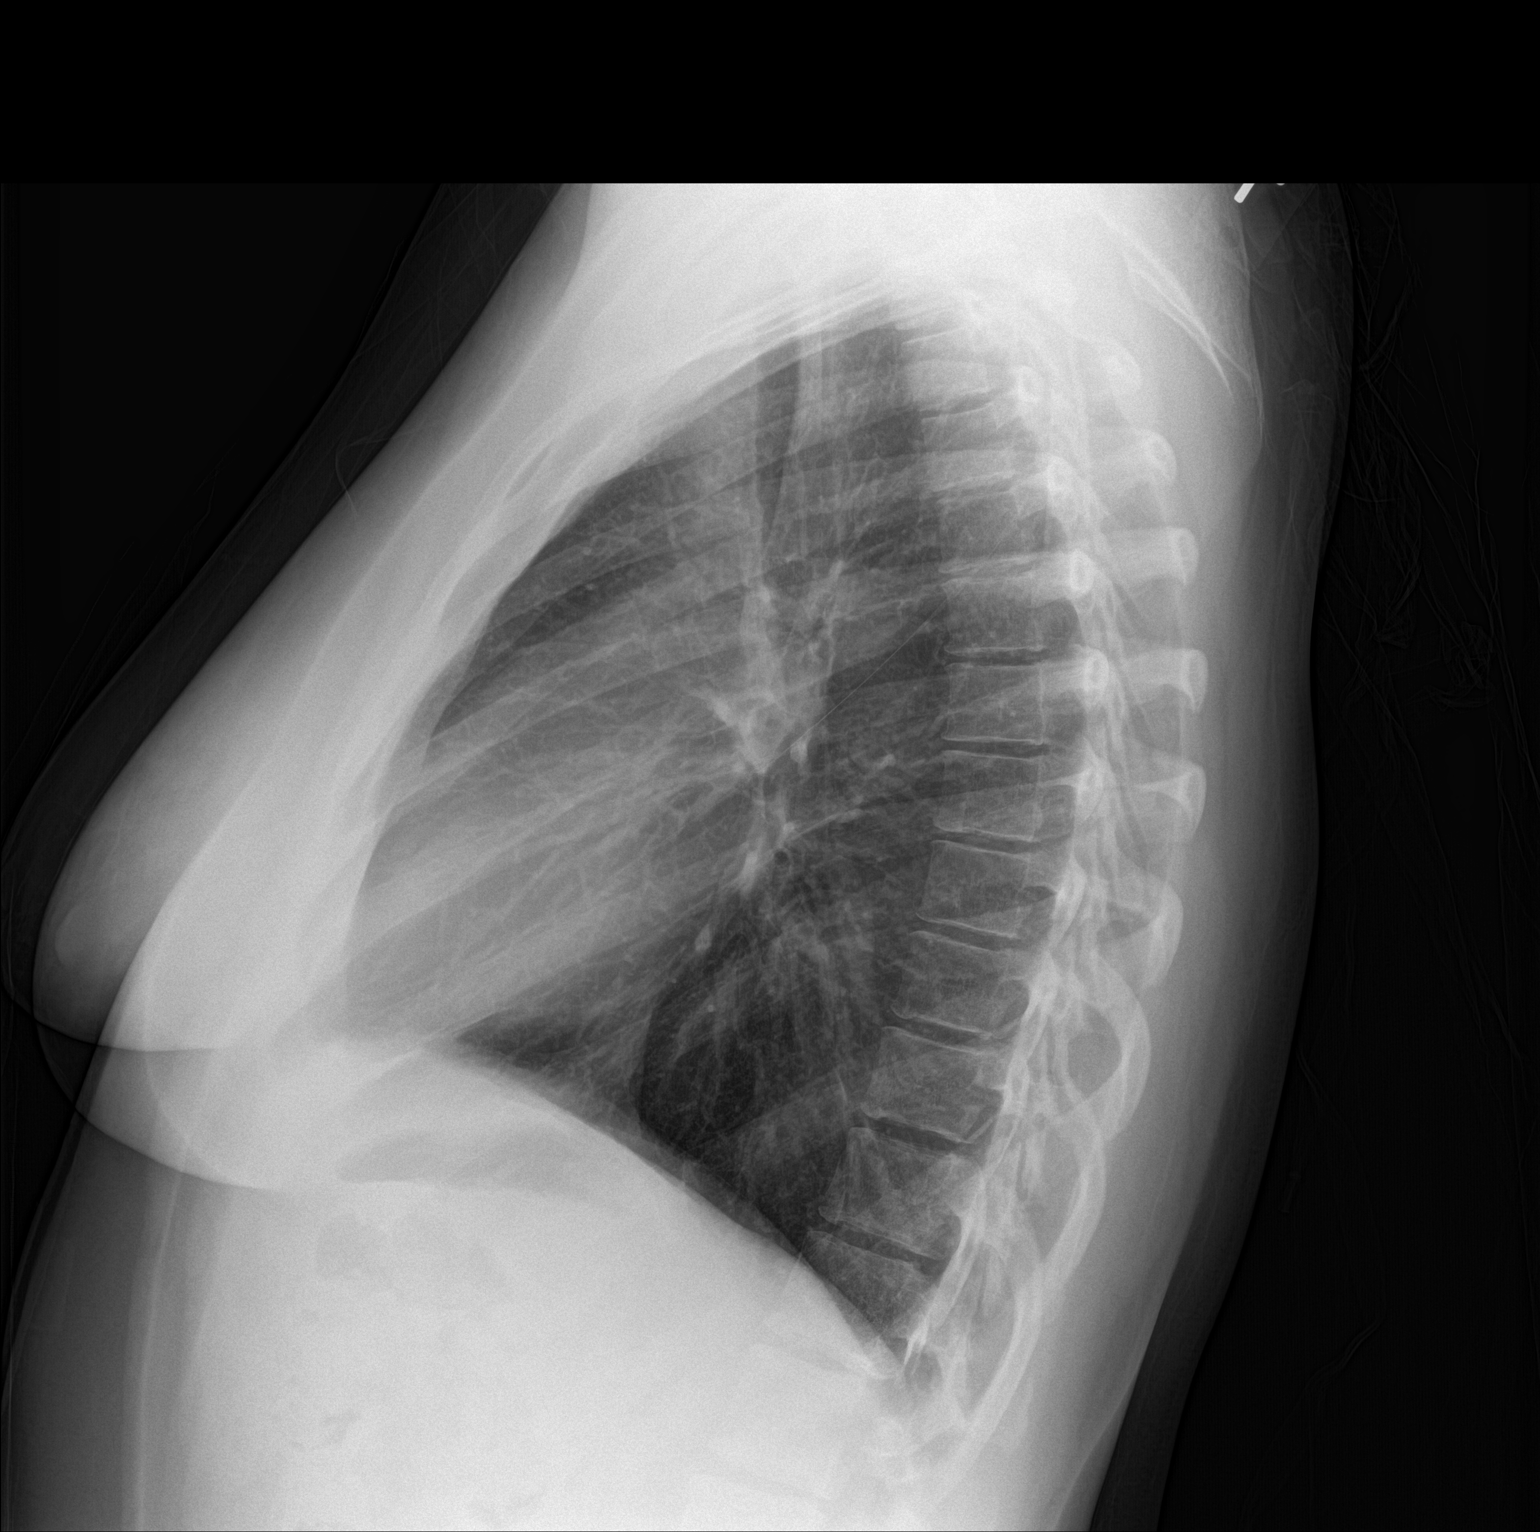

[2 of 2 positions shown; findings below may reference images not displayed]

FINDINGS: The heart size and mediastinal contours are within normal limits.
Both lungs are clear. The visualized skeletal structures are
unremarkable.
IMPRESSION: No active cardiopulmonary disease.

## 2017-11-08 ENCOUNTER — Other Ambulatory Visit: Payer: Self-pay

## 2017-11-08 ENCOUNTER — Emergency Department
Admission: EM | Admit: 2017-11-08 | Discharge: 2017-11-08 | Disposition: A | Discharge status: Home / Self Care | Payer: Self-pay | Attending: Emergency Medicine | Admitting: Emergency Medicine

## 2017-11-08 DIAGNOSIS — J45909 Unspecified asthma, uncomplicated: Secondary | ICD-10-CM | POA: Insufficient documentation

## 2017-11-08 DIAGNOSIS — K529 Noninfective gastroenteritis and colitis, unspecified: Secondary | ICD-10-CM | POA: Insufficient documentation

## 2017-11-08 DIAGNOSIS — Z79899 Other long term (current) drug therapy: Secondary | ICD-10-CM | POA: Insufficient documentation

## 2017-11-08 DIAGNOSIS — F17210 Nicotine dependence, cigarettes, uncomplicated: Secondary | ICD-10-CM | POA: Insufficient documentation

## 2017-11-08 LAB — URINALYSIS, COMPLETE (UACMP) WITH MICROSCOPIC
BACTERIA UA: NONE SEEN
Bilirubin Urine: NEGATIVE
GLUCOSE, UA: NEGATIVE mg/dL
HGB URINE DIPSTICK: NEGATIVE
Ketones, ur: NEGATIVE mg/dL
NITRITE: NEGATIVE
Protein, ur: NEGATIVE mg/dL
SPECIFIC GRAVITY, URINE: 1.016 (ref 1.005–1.030)
pH: 5 (ref 5.0–8.0)

## 2017-11-08 LAB — COMPREHENSIVE METABOLIC PANEL
ALBUMIN: 4 g/dL (ref 3.5–5.0)
ALT: 24 U/L (ref 14–54)
ANION GAP: 8 (ref 5–15)
AST: 27 U/L (ref 15–41)
Alkaline Phosphatase: 62 U/L (ref 38–126)
BILIRUBIN TOTAL: 0.5 mg/dL (ref 0.3–1.2)
BUN: 8 mg/dL (ref 6–20)
CO2: 22 mmol/L (ref 22–32)
CREATININE: 0.6 mg/dL (ref 0.44–1.00)
Calcium: 9.1 mg/dL (ref 8.9–10.3)
Chloride: 107 mmol/L (ref 101–111)
Glucose, Bld: 104 mg/dL — ABNORMAL HIGH (ref 65–99)
Potassium: 3.5 mmol/L (ref 3.5–5.1)
Sodium: 137 mmol/L (ref 135–145)
TOTAL PROTEIN: 7.1 g/dL (ref 6.5–8.1)

## 2017-11-08 LAB — CBC
HEMATOCRIT: 39.5 % (ref 35.0–47.0)
Hemoglobin: 13.3 g/dL (ref 12.0–16.0)
MCH: 29.6 pg (ref 26.0–34.0)
MCHC: 33.8 g/dL (ref 32.0–36.0)
MCV: 87.6 fL (ref 80.0–100.0)
Platelets: 237 10*3/uL (ref 150–440)
RBC: 4.51 MIL/uL (ref 3.80–5.20)
RDW: 13.2 % (ref 11.5–14.5)
WBC: 12.3 10*3/uL — AB (ref 3.6–11.0)

## 2017-11-08 LAB — LIPASE, BLOOD: Lipase: 26 U/L (ref 11–51)

## 2017-11-08 LAB — POCT PREGNANCY, URINE: PREG TEST UR: NEGATIVE

## 2017-11-08 MED ORDER — SODIUM CHLORIDE 0.9 % IV SOLN
1000.0000 mL | Freq: Once | INTRAVENOUS | Status: AC
  Administered 2017-11-08: 1000 mL via INTRAVENOUS

## 2017-11-08 MED ORDER — ONDANSETRON 4 MG PO TBDP: 4.0000 mg | ORAL_TABLET | Freq: Three times a day (TID) | ORAL | 0 refills | Status: DC | PRN

## 2017-11-08 MED ORDER — ONDANSETRON HCL 4 MG/2ML IJ SOLN
4.0000 mg | Freq: Once | INTRAMUSCULAR | Status: AC
  Administered 2017-11-08: 4 mg via INTRAVENOUS
  Filled 2017-11-08: qty 2

## 2017-11-08 NOTE — ED Provider Notes (Signed)
[facility] Emergency Department Provider Note   ____________________________________________    I have reviewed the triage vital signs and the nursing notes.   HISTORY  Chief Complaint Nausea and Emesis     HPI [name] is a 32 y.o. female who presents with complaints of nausea vomiting and diarrhea which developed 2 days ago.  She complains of body aches as well primarily in the low back and thighs.  Does not think that she has had a fever but some chills.  No sick contacts reported.  Occasional abdominal cramps.  Has not taken anything for this.  Missed work the last 2 days.   Past Medical History:  Diagnosis Date  . Allergy   . Asthma   . Bipolar 1 disorder (HCC)   . Bipolar disorder (HCC)   . Ectopic pregnancy   . GERD (gastroesophageal reflux disease)   . History of concussion 2016  . Hyperlipidemia   . Migraine   . MRSA (methicillin resistant Staphylococcus aureus)   . Pseudoseizures   . Scoliosis   . Scoliosis     Patient Active Problem List   Diagnosis Date Noted  . Need for diphtheria-tetanus-pertussis (Tdap) vaccine, adult/adolescent 04/05/2015  . Abnormal weight gain 03/12/2015  . Encounter for medication monitoring 03/12/2015  . Abdominal pain, acute, right lower quadrant 03/02/2015  . Bipolar affective disorder (HCC) 01/26/2015  . Convulsion (HCC) 01/25/2015  . Seizures (HCC) 01/12/2015  . Asthma   . Hyperlipidemia   . Bipolar disorder (HCC)   . Allergy   . MRSA (methicillin resistant Staphylococcus aureus)   . Migraine headache with aura     Past Surgical History:  Procedure Laterality Date  . CARPAL TUNNEL RELEASE  2015   left  . HERNIA REPAIR  2010  . TUBAL LIGATION      Prior to Admission medications   Medication Sig Start Date End Date Taking? Authorizing Provider  azithromycin (ZITHROMAX Z-PAK) 250 MG tablet 2 pills today then 1 pill a day for 4 days 04/07/17   Sherrie Mustache Roselyn Bering, PA-C  benzonatate  (TESSALON) 200 MG capsule Take 1 capsule (200 mg total) by mouth 3 (three) times daily as needed for cough. 04/07/17   Fisher, Roselyn Bering, PA-C  divalproex (DEPAKOTE) 500 MG DR tablet Take 500 mg by mouth 2 (two) times daily.     [provider]  FLUoxetine (PROZAC) 20 MG tablet Take 20 mg by mouth daily.    [provider]  HYDROXYZINE PAMOATE PO Take by mouth 3 (three) times daily as needed.     [provider]  ketorolac (TORADOL) 10 MG tablet Take 1 tablet (10 mg total) by mouth every 8 (eight) hours as needed for moderate pain (with food). 07/23/17   Rockne Menghini, MD  methocarbamol (ROBAXIN) 500 MG tablet 1-2 tablets every 6 hours prn muscles spasms 02/26/17   Tommi Rumps, PA-C  methylPREDNISolone (MEDROL DOSEPAK) 4 MG TBPK tablet Take 6 pills on day one then decrease by 1 pill each day 04/07/17   Faythe Ghee, PA-C  Multiple Vitamin (MULTIVITAMIN) tablet Take 1 tablet by mouth daily.    [provider]  ondansetron (ZOFRAN ODT) 4 MG disintegrating tablet Take 1 tablet (4 mg total) by mouth every 8 (eight) hours as needed for nausea or vomiting. 11/08/17   Jene Every, MD  oxyCODONE-acetaminophen (ROXICET) 5-325 MG tablet Take 1 tablet by mouth every 6 (six) hours as needed for moderate pain. 02/26/17 02/26/18  Tommi Rumps, PA-C  predniSONE (DELTASONE) 10 MG tablet Take 6 tablets on day 1, take 5 tablets on day 2, take 4 tablets on day 3, take 3 tablets on day 4, take 2 tablets on day 5, take 1 tablet on day 6 07/01/17   Enid Derry, PA-C  vitamin B-12 (CYANOCOBALAMIN) 1000 MCG tablet Take 1,000 mcg by mouth daily.    [provider]  vitamin C (ASCORBIC ACID) 500 MG tablet Take 500 mg by mouth daily.    [provider]     Allergies Robitussin (alcohol free) [guaifenesin]; Aspirin; Tramadol; and Codeine  Family History  Problem Relation Age of Onset  . Asthma Mother   . Heart disease Mother   . Diabetes Mother   .  Hyperlipidemia Mother   . Hypertension Mother   . Mental illness Mother   . Migraines Mother   . Thyroid disease Mother   . Stroke Mother   . Hypertension Sister   . Cancer Maternal Grandmother   . Diabetes Maternal Grandmother   . Heart disease Maternal Grandmother   . Hyperlipidemia Maternal Grandmother   . Hypertension Maternal Grandmother   . Kidney disease Maternal Grandmother   . Heart disease Paternal Grandfather     Social History Social History   Tobacco Use  . Smoking status: Current Every Day Smoker    Packs/day: 1.00    Types: Cigarettes  . Smokeless tobacco: Never Used  Substance Use Topics  . Alcohol use: No    Alcohol/week: 0.0 oz  . Drug use: No    Review of Systems  Constitutional: No fever Eyes: No visual changes.  ENT: No sore throat. Cardiovascular: Denies chest pain. Respiratory: Denies shortness of breath. Gastrointestinal: As above Genitourinary: Negative for dysuria. Musculoskeletal: Positive for myalgias Skin: Negative for rash. Neurological: Negative for headaches   ____________________________________________   PHYSICAL EXAM:  VITAL SIGNS: ED Triage Vitals  Enc Vitals Group     BP 11/08/17 0708 118/86     Pulse Rate 11/08/17 0708 97     Resp 11/08/17 0708 18     Temp 11/08/17 0708 98.3 F (36.8 C)     Temp Source 11/08/17 0708 Oral     SpO2 11/08/17 0708 99 %     Weight 11/08/17 0709 78 kg (172 lb)     Height 11/08/17 0709 1.651 m ( )     Head Circumference --      Peak Flow --      Pain Score 11/08/17 0715 5     Pain Loc --      Pain Edu? --      Excl. in GC? --     Constitutional: Alert and oriented. No acute distress. Pleasant and interactive Eyes: Conjunctivae are normal.   Nose: No congestion/rhinnorhea. Mouth/Throat: Mucous membranes are moist.    Cardiovascular: Normal rate, regular rhythm. Grossly normal heart sounds.  Good peripheral circulation. Respiratory: Normal respiratory effort.  No retractions.  Lungs CTAB. Gastrointestinal: Soft and nontender. No distention.  No CVA tenderness.  Musculoskeletal:  Warm and well perfused Neurologic:  Normal speech and language. No gross focal neurologic deficits are appreciated.  Skin:  Skin is warm, dry and intact. No rash noted. Psychiatric: Mood and affect are normal. Speech and behavior are normal.  ____________________________________________   LABS (all labs ordered are listed, but only abnormal results are displayed)  Labs Reviewed  CBC - Abnormal; Notable for the following components:      Result Value  WBC 12.3 (*)    All other components within normal limits  COMPREHENSIVE METABOLIC PANEL - Abnormal; Notable for the following components:   Glucose, Bld 104 (*)    All other components within normal limits  URINALYSIS, COMPLETE (UACMP) WITH MICROSCOPIC - Abnormal; Notable for the following components:   Color, Urine AMBER (*)    APPearance HAZY (*)    Leukocytes, UA TRACE (*)    All other components within normal limits  LIPASE, BLOOD  POCT PREGNANCY, URINE   ____________________________________________  EKG  None ____________________________________________  RADIOLOGY  None ____________________________________________   PROCEDURES  Procedure(s) performed: No  Procedures   Critical Care performed: No ____________________________________________   INITIAL IMPRESSION / ASSESSMENT AND PLAN / ED COURSE  Pertinent labs & imaging results that were available during my care of the patient were reviewed by me and considered in my medical decision making (see chart for details).  Patient overall well-appearing in no acute distress.  Abdominal exam is quite normal.  HPI most consistent with viral gastroenteritis given myalgias, nausea vomiting diarrhea.  Will give IV fluids, IV Zofran check labs and reevaluate.  ----------------------------------------- 8:36 AM on  11/08/2017 -----------------------------------------  Patient feels significantly better, lab work is overall unremarkable, mild elevation white blood cell count is nonspecific.  Will discharge with p.o. Zofran    ____________________________________________   FINAL CLINICAL IMPRESSION(S) / ED DIAGNOSES  Final diagnoses:  Gastroenteritis        Note:  This document was prepared using Dragon voice recognition software and may include unintentional dictation errors.    Jene Every, MD 11/08/17 [phone]

## 2017-11-08 NOTE — ED Triage Notes (Signed)
Pt arrived via POV from home with c/o Nausea/Vomiting since Wednesday. Pt states that she has been sick and unable to eat for past few days and having lower back pain of "5/10." Pt states that she has been really tired and aching.

## 2017-12-19 ENCOUNTER — Encounter: Payer: Self-pay | Admitting: Emergency Medicine

## 2017-12-19 ENCOUNTER — Emergency Department
Admission: EM | Admit: 2017-12-19 | Discharge: 2017-12-19 | Disposition: A | Discharge status: Home / Self Care | Payer: Self-pay | Attending: Emergency Medicine | Admitting: Emergency Medicine

## 2017-12-19 DIAGNOSIS — Z79899 Other long term (current) drug therapy: Secondary | ICD-10-CM | POA: Insufficient documentation

## 2017-12-19 DIAGNOSIS — W230XXA Caught, crushed, jammed, or pinched between moving objects, initial encounter: Secondary | ICD-10-CM | POA: Insufficient documentation

## 2017-12-19 DIAGNOSIS — S5011XA Contusion of right forearm, initial encounter: Secondary | ICD-10-CM | POA: Insufficient documentation

## 2017-12-19 DIAGNOSIS — J45909 Unspecified asthma, uncomplicated: Secondary | ICD-10-CM | POA: Insufficient documentation

## 2017-12-19 DIAGNOSIS — F17210 Nicotine dependence, cigarettes, uncomplicated: Secondary | ICD-10-CM | POA: Insufficient documentation

## 2017-12-19 DIAGNOSIS — Y929 Unspecified place or not applicable: Secondary | ICD-10-CM | POA: Insufficient documentation

## 2017-12-19 DIAGNOSIS — Y939 Activity, unspecified: Secondary | ICD-10-CM | POA: Insufficient documentation

## 2017-12-19 DIAGNOSIS — Y990 Civilian activity done for income or pay: Secondary | ICD-10-CM | POA: Insufficient documentation

## 2017-12-19 MED ORDER — TRAMADOL HCL 50 MG PO TABS: 50.0000 mg | ORAL_TABLET | Freq: Two times a day (BID) | ORAL | 0 refills | Status: DC | PRN

## 2017-12-19 MED ORDER — OXYCODONE-ACETAMINOPHEN 7.5-325 MG PO TABS: 1.0000 | ORAL_TABLET | Freq: Four times a day (QID) | ORAL | 0 refills | Status: DC | PRN

## 2017-12-19 MED ORDER — IBUPROFEN 600 MG PO TABS: 600.0000 mg | ORAL_TABLET | Freq: Three times a day (TID) | ORAL | 0 refills | Status: DC | PRN

## 2017-12-19 NOTE — ED Provider Notes (Signed)
[facility] Emergency Department Provider Note   ____________________________________________   First MD Initiated Contact with Patient 12/19/17 0820     (approximate)  I have reviewed the triage vital signs and the nursing notes.   HISTORY  Chief Complaint Arm Injury    HPI [name] is a 32 y.o. female patient presents with right arm pain secondary to contusion by machine at work.  Patient is adamant she did not want to file Worker's Compensation for injury.  Patient rates the pain a 7/10.  Patient described pain is "aching".  Patient has lost sensation loss of function of the right upper extremity.  Patient is right-hand dominant.  No palliative measures prior to arrival.   Past Medical History:  Diagnosis Date  . Allergy   . Asthma   . Bipolar 1 disorder (HCC)   . Bipolar disorder (HCC)   . Ectopic pregnancy   . GERD (gastroesophageal reflux disease)   . History of concussion 2016  . Hyperlipidemia   . Migraine   . MRSA (methicillin resistant Staphylococcus aureus)   . Pseudoseizures   . Scoliosis   . Scoliosis     Patient Active Problem List   Diagnosis Date Noted  . Need for diphtheria-tetanus-pertussis (Tdap) vaccine, adult/adolescent 04/05/2015  . Abnormal weight gain 03/12/2015  . Encounter for medication monitoring 03/12/2015  . Abdominal pain, acute, right lower quadrant 03/02/2015  . Bipolar affective disorder (HCC) 01/26/2015  . Convulsion (HCC) 01/25/2015  . Seizures (HCC) 01/12/2015  . Asthma   . Hyperlipidemia   . Bipolar disorder (HCC)   . Allergy   . MRSA (methicillin resistant Staphylococcus aureus)   . Migraine headache with aura     Past Surgical History:  Procedure Laterality Date  . CARPAL TUNNEL RELEASE  2015   left  . HERNIA REPAIR  2010  . TUBAL LIGATION      Prior to Admission medications   Medication Sig Start Date End Date Taking? Authorizing Provider  azithromycin (ZITHROMAX Z-PAK) 250 MG  tablet 2 pills today then 1 pill a day for 4 days 04/07/17   Sherrie Mustache Roselyn Bering, PA-C  benzonatate (TESSALON) 200 MG capsule Take 1 capsule (200 mg total) by mouth 3 (three) times daily as needed for cough. 04/07/17   Fisher, Roselyn Bering, PA-C  divalproex (DEPAKOTE) 500 MG DR tablet Take 500 mg by mouth 2 (two) times daily.     [provider]  FLUoxetine (PROZAC) 20 MG tablet Take 20 mg by mouth daily.    [provider]  HYDROXYZINE PAMOATE PO Take by mouth 3 (three) times daily as needed.     [provider]  ketorolac (TORADOL) 10 MG tablet Take 1 tablet (10 mg total) by mouth every 8 (eight) hours as needed for moderate pain (with food). 07/23/17   Rockne Menghini, MD  methocarbamol (ROBAXIN) 500 MG tablet 1-2 tablets every 6 hours prn muscles spasms 02/26/17   Tommi Rumps, PA-C  methylPREDNISolone (MEDROL DOSEPAK) 4 MG TBPK tablet Take 6 pills on day one then decrease by 1 pill each day 04/07/17   Faythe Ghee, PA-C  Multiple Vitamin (MULTIVITAMIN) tablet Take 1 tablet by mouth daily.    [provider]  ondansetron (ZOFRAN ODT) 4 MG disintegrating tablet Take 1 tablet (4 mg total) by mouth every 8 (eight) hours as needed for nausea or vomiting. 11/08/17   Jene Every, MD  oxyCODONE-acetaminophen (PERCOCET) 7.5-325 MG tablet Take 1 tablet by mouth every 6 (  six) hours as needed for severe pain. 12/19/17   Joni Reining, PA-C  oxyCODONE-acetaminophen (ROXICET) 5-325 MG tablet Take 1 tablet by mouth every 6 (six) hours as needed for moderate pain. 02/26/17 02/26/18  Tommi Rumps, PA-C  predniSONE (DELTASONE) 10 MG tablet Take 6 tablets on day 1, take 5 tablets on day 2, take 4 tablets on day 3, take 3 tablets on day 4, take 2 tablets on day 5, take 1 tablet on day 6 07/01/17   Enid Derry, PA-C  vitamin B-12 (CYANOCOBALAMIN) 1000 MCG tablet Take 1,000 mcg by mouth daily.    [provider]  vitamin C (ASCORBIC ACID) 500 MG tablet Take 500 mg by  mouth daily.    [provider]    Allergies Robitussin (alcohol free) [guaifenesin]; Aspirin; Tramadol; and Codeine  Family History  Problem Relation Age of Onset  . Asthma Mother   . Heart disease Mother   . Diabetes Mother   . Hyperlipidemia Mother   . Hypertension Mother   . Mental illness Mother   . Migraines Mother   . Thyroid disease Mother   . Stroke Mother   . Hypertension Sister   . Cancer Maternal Grandmother   . Diabetes Maternal Grandmother   . Heart disease Maternal Grandmother   . Hyperlipidemia Maternal Grandmother   . Hypertension Maternal Grandmother   . Kidney disease Maternal Grandmother   . Heart disease Paternal Grandfather     Social History Social History   Tobacco Use  . Smoking status: Current Every Day Smoker    Packs/day: 1.00    Types: Cigarettes  . Smokeless tobacco: Never Used  Substance Use Topics  . Alcohol use: No    Alcohol/week: 0.0 oz  . Drug use: No    Review of Systems Constitutional: No fever/chills Eyes: No visual changes. ENT: No sore throat. Cardiovascular: Denies chest pain. Respiratory: Denies shortness of breath. Gastrointestinal: No abdominal pain.  No nausea, no vomiting.  No diarrhea.  No constipation. Genitourinary: Negative for dysuria. Musculoskeletal: Negative for back pain. Skin: Negative for rash. Neurological: Negative for headaches, focal weakness or numbness. Psychiatric:Bipolar Endocrine:Hyperlipidemia Allergic/Immunilogical: See medication list ____________________________________________   PHYSICAL EXAM:  VITAL SIGNS: ED Triage Vitals [12/19/17 0818]  Enc Vitals Group     BP 131/73     Pulse Rate (!) 106     Resp 20     Temp 98.3 F (36.8 C)     Temp Source Oral     SpO2 100 %     Weight 172 lb (78 kg)     Height 5\' 5"  (1.651 m)     Head Circumference      Peak Flow      Pain Score 7     Pain Loc      Pain Edu?      Excl. in GC?     Constitutional: Alert and  oriented. Well appearing and in no acute distress. Cardiovascular: Normal rate, regular rhythm. Grossly normal heart sounds.  Good peripheral circulation. Respiratory: Normal respiratory effort.  No retractions. Lungs CTAB. Musculoskeletal: No obvious deformity to the right upper extremity.  Patient has full and equal range of motion. Neurologic:  Normal speech and language. No gross focal neurologic deficits are appreciated. No gait instability. Skin:  Skin is warm, dry and intact. No rash noted.  Ecchymosis right forearm Psychiatric: Mood and affect are normal. Speech and behavior are normal.  ____________________________________________   LABS (all labs ordered are listed, but  only abnormal results are displayed)  Labs Reviewed - No data to display ____________________________________________  EKG   ____________________________________________  RADIOLOGY  ED MD interpretation:    Official radiology report(s): No results found.  ____________________________________________   PROCEDURES  Procedure(s) performed: None  Procedures  Critical Care performed: No  ____________________________________________   INITIAL IMPRESSION / ASSESSMENT AND PLAN / ED COURSE  As part of my medical decision making, I reviewed the following data within the electronic MEDICAL RECORD NUMBER    Right forearm pain secondary to contusion.  Patient given discharge care instruction advised take medication as directed.  Patient advised follow-up PCP if no improvement in 3 to 5 days.      ____________________________________________   FINAL CLINICAL IMPRESSION(S) / ED DIAGNOSES  Final diagnoses:  Contusion of right forearm, initial encounter     ED Discharge Orders        Ordered    ibuprofen (ADVIL,MOTRIN) 600 MG tablet  Every 8 hours PRN,   Status:  Discontinued     12/19/17 0857    traMADol (ULTRAM) 50 MG tablet  Every 12 hours PRN,   Status:  Discontinued     12/19/17 0858     oxyCODONE-acetaminophen (PERCOCET) 7.5-325 MG tablet  Every 6 hours PRN     12/19/17 [id number]       Note:  This document was prepared using Dragon voice recognition software and may include unintentional dictation errors.    Joni ReiningSmith, Lennon Boutwell K, PA-C 12/19/17 [id number]    Jene EveryKinner, Robert, MD 12/19/17 1242

## 2017-12-19 NOTE — ED Triage Notes (Signed)
Pt reports got her right arm caught in a machine at work and now with pain. Bruising noted to forearm. Pt reports she does not want to file workers comp.

## 2017-12-19 NOTE — ED Notes (Signed)
See triage note  States she put her right arm into a machine [facility]   No deformity noted  Good pulses

## 2017-12-19 NOTE — Discharge Instructions (Addendum)
Follow discharge care instructions. 

## 2018-01-05 ENCOUNTER — Emergency Department
Admission: EM | Admit: 2018-01-05 | Discharge: 2018-01-05 | Disposition: A | Discharge status: Home / Self Care | Payer: Self-pay | Attending: Emergency Medicine | Admitting: Emergency Medicine

## 2018-01-05 ENCOUNTER — Emergency Department: Payer: Self-pay

## 2018-01-05 DIAGNOSIS — M79672 Pain in left foot: Secondary | ICD-10-CM | POA: Insufficient documentation

## 2018-01-05 DIAGNOSIS — Z79899 Other long term (current) drug therapy: Secondary | ICD-10-CM | POA: Insufficient documentation

## 2018-01-05 DIAGNOSIS — J45909 Unspecified asthma, uncomplicated: Secondary | ICD-10-CM | POA: Insufficient documentation

## 2018-01-05 DIAGNOSIS — M25562 Pain in left knee: Secondary | ICD-10-CM | POA: Insufficient documentation

## 2018-01-05 DIAGNOSIS — F17210 Nicotine dependence, cigarettes, uncomplicated: Secondary | ICD-10-CM | POA: Insufficient documentation

## 2018-01-05 MED ORDER — OXYCODONE-ACETAMINOPHEN 7.5-325 MG PO TABS: 1.0000 | ORAL_TABLET | Freq: Four times a day (QID) | ORAL | 0 refills | Status: DC | PRN

## 2018-01-05 MED ORDER — OXYCODONE-ACETAMINOPHEN 5-325 MG PO TABS
1.0000 | ORAL_TABLET | Freq: Once | ORAL | Status: AC
  Administered 2018-01-05: 1 via ORAL
  Filled 2018-01-05: qty 1

## 2018-01-05 NOTE — ED Triage Notes (Signed)
Patient reports she stepped on a soft spot on the floor and fell through. Patient c/o left knee pain and left foot pain.

## 2018-01-05 NOTE — Discharge Instructions (Signed)
Wear Ace wrap and splint for 2 days.  Ambulate with crutches as needed.

## 2018-01-05 NOTE — ED Provider Notes (Signed)
[facility] Emergency Department Provider Note  ____________________________________________   First MD Initiated Contact with Patient 01/05/18 2028     (approximate)  I have reviewed the triage vital signs and the nursing notes.   HISTORY  Chief Complaint Leg Injury    HPI [name] is a 32 y.o. female patient complain of left knee and left foot pain secondary to stepping to a floor at home.  Patient rates the pain as a 10/10.  Patient described the pain is "ache".  No palliative measures prior to arrival.  Past Medical History:  Diagnosis Date  . Allergy   . Asthma   . Bipolar 1 disorder (HCC)   . Bipolar disorder (HCC)   . Ectopic pregnancy   . GERD (gastroesophageal reflux disease)   . History of concussion 2016  . Hyperlipidemia   . Migraine   . MRSA (methicillin resistant Staphylococcus aureus)   . Pseudoseizures   . Scoliosis   . Scoliosis     Patient Active Problem List   Diagnosis Date Noted  . Need for diphtheria-tetanus-pertussis (Tdap) vaccine, adult/adolescent 04/05/2015  . Abnormal weight gain 03/12/2015  . Encounter for medication monitoring 03/12/2015  . Abdominal pain, acute, right lower quadrant 03/02/2015  . Bipolar affective disorder (HCC) 01/26/2015  . Convulsion (HCC) 01/25/2015  . Seizures (HCC) 01/12/2015  . Asthma   . Hyperlipidemia   . Bipolar disorder (HCC)   . Allergy   . MRSA (methicillin resistant Staphylococcus aureus)   . Migraine headache with aura     Past Surgical History:  Procedure Laterality Date  . CARPAL TUNNEL RELEASE  2015   left  . HERNIA REPAIR  2010  . TUBAL LIGATION      Prior to Admission medications   Medication Sig Start Date End Date Taking? Authorizing Provider  azithromycin (ZITHROMAX Z-PAK) 250 MG tablet 2 pills today then 1 pill a day for 4 days 04/07/17   Sherrie Mustache Roselyn Bering, PA-C  benzonatate (TESSALON) 200 MG capsule Take 1 capsule (200 mg total) by mouth 3 (three)  times daily as needed for cough. 04/07/17   Fisher, Roselyn Bering, PA-C  divalproex (DEPAKOTE) 500 MG DR tablet Take 500 mg by mouth 2 (two) times daily.     [provider]  FLUoxetine (PROZAC) 20 MG tablet Take 20 mg by mouth daily.    [provider]  HYDROXYZINE PAMOATE PO Take by mouth 3 (three) times daily as needed.     [provider]  ketorolac (TORADOL) 10 MG tablet Take 1 tablet (10 mg total) by mouth every 8 (eight) hours as needed for moderate pain (with food). 07/23/17   Rockne Menghini, MD  methocarbamol (ROBAXIN) 500 MG tablet 1-2 tablets every 6 hours prn muscles spasms 02/26/17   Tommi Rumps, PA-C  methylPREDNISolone (MEDROL DOSEPAK) 4 MG TBPK tablet Take 6 pills on day one then decrease by 1 pill each day 04/07/17   Faythe Ghee, PA-C  Multiple Vitamin (MULTIVITAMIN) tablet Take 1 tablet by mouth daily.    [provider]  ondansetron (ZOFRAN ODT) 4 MG disintegrating tablet Take 1 tablet (4 mg total) by mouth every 8 (eight) hours as needed for nausea or vomiting. 11/08/17   Jene Every, MD  oxyCODONE-acetaminophen (PERCOCET) 7.5-325 MG tablet Take 1 tablet by mouth every 6 (six) hours as needed for severe pain. 12/19/17   Joni Reining, PA-C  oxyCODONE-acetaminophen (PERCOCET) 7.5-325 MG tablet Take 1 tablet by mouth every 6 (six)  hours as needed for severe pain. 01/05/18   Joni ReiningSmith, Ronald K, PA-C  oxyCODONE-acetaminophen (ROXICET) 5-325 MG tablet Take 1 tablet by mouth every 6 (six) hours as needed for moderate pain. 02/26/17 02/26/18  Tommi RumpsSummers, Rhonda L, PA-C  predniSONE (DELTASONE) 10 MG tablet Take 6 tablets on day 1, take 5 tablets on day 2, take 4 tablets on day 3, take 3 tablets on day 4, take 2 tablets on day 5, take 1 tablet on day 6 07/01/17   Enid DerryWagner, Ashley, PA-C  vitamin B-12 (CYANOCOBALAMIN) 1000 MCG tablet Take 1,000 mcg by mouth daily.    [provider]  vitamin C (ASCORBIC ACID) 500 MG tablet Take 500 mg by mouth daily.     [provider]    Allergies Robitussin (alcohol free) [guaifenesin]; Aspirin; Tramadol; and Codeine  Family History  Problem Relation Age of Onset  . Asthma Mother   . Heart disease Mother   . Diabetes Mother   . Hyperlipidemia Mother   . Hypertension Mother   . Mental illness Mother   . Migraines Mother   . Thyroid disease Mother   . Stroke Mother   . Hypertension Sister   . Cancer Maternal Grandmother   . Diabetes Maternal Grandmother   . Heart disease Maternal Grandmother   . Hyperlipidemia Maternal Grandmother   . Hypertension Maternal Grandmother   . Kidney disease Maternal Grandmother   . Heart disease Paternal Grandfather     Social History Social History   Tobacco Use  . Smoking status: Current Every Day Smoker    Packs/day: 1.00    Types: Cigarettes  . Smokeless tobacco: Never Used  Substance Use Topics  . Alcohol use: No    Alcohol/week: 0.0 oz  . Drug use: No    Review of Systems Constitutional: No fever/chills Eyes: No visual changes. ENT: No sore throat. Cardiovascular: Denies chest pain. Respiratory: Denies shortness of breath. Gastrointestinal: No abdominal pain.  No nausea, no vomiting.  No diarrhea.  No constipation. Genitourinary: Negative for dysuria. Musculoskeletal: Left knee and foot pain. Skin: Negative for rash. Neurological: Negative for headaches, focal weakness or numbness. Psychiatric:Bipolar Endocrine:Hyperlipidemia Allergic/Immunilogical: See medication list. ____________________________________________   PHYSICAL EXAM:  VITAL SIGNS: ED Triage Vitals  Enc Vitals Group     BP 01/05/18 2018 118/66     Pulse Rate 01/05/18 2018 (!) 106     Resp 01/05/18 2018 16     Temp 01/05/18 2018 98.4 F (36.9 C)     Temp Source 01/05/18 2018 Oral     SpO2 01/05/18 2018 98 %     Weight 01/05/18 2019 172 lb (78 kg)     Height 01/05/18 2019 5\' 5"  (1.651 m)     Head Circumference --      Peak Flow --      Pain Score  01/05/18 2023 8     Pain Loc --      Pain Edu? --      Excl. in GC? --    Constitutional: Alert and oriented. Well appearing and in no acute distress. Cardiovascular: Normal rate, regular rhythm. Grossly normal heart sounds.  Good peripheral circulation. Respiratory: Normal respiratory effort.  No retractions. Lungs CTAB. Musculoskeletal: No obvious deformity to the left lower extremity.  Full and equal range of motion to grimace of pain. Neurologic:  Normal speech and language. No gross focal neurologic deficits are appreciated. No gait instability. Skin:  Skin is warm, dry and intact. No rash noted.  Abrasion left anterior  patella and dorsal aspect of the left foot. Psychiatric: Mood and affect are normal. Speech and behavior are normal.  ____________________________________________   LABS (all labs ordered are listed, but only abnormal results are displayed)  Labs Reviewed - No data to display ____________________________________________  EKG   ____________________________________________  RADIOLOGY  ED MD interpretation:    Official radiology report(s): Dg Knee Complete 4 Views Left  Result Date: 01/05/2018 CLINICAL DATA:  Larey Seat through floor today.  Anterior pain. EXAM: LEFT KNEE - COMPLETE 4+ VIEW COMPARISON:  None. FINDINGS: No evidence of fracture, dislocation, or joint effusion. No evidence of arthropathy or other focal bone abnormality. Soft tissues are unremarkable. IMPRESSION: Normal Electronically Signed   By: Paulina Fusi M.D.   On: 01/05/2018 21:04   Dg Foot Complete Left  Result Date: 01/05/2018 CLINICAL DATA:  Larey Seat through floor today.  Pain. EXAM: LEFT FOOT - COMPLETE 3+ VIEW COMPARISON:  07/24/2016 FINDINGS: There is no evidence of fracture or dislocation. There is no evidence of arthropathy or other focal bone abnormality. Soft tissues are unremarkable. IMPRESSION: Negative. Electronically Signed   By: Paulina Fusi M.D.   On: 01/05/2018 21:05     ____________________________________________   PROCEDURES  Procedure(s) performed: None  Procedures  Critical Care performed: No  ____________________________________________   INITIAL IMPRESSION / ASSESSMENT AND PLAN / ED COURSE  As part of my medical decision making, I reviewed the following data within the electronic MEDICAL RECORD NUMBER    Left knee and ankle pain secondary to fall.  Discussed negative x-ray findings with patient.  Patient given discharge care instruction advised take medication as directed.  Patient advised follow-up PCP if condition persist.      ____________________________________________   FINAL CLINICAL IMPRESSION(S) / ED DIAGNOSES  Final diagnoses:  Anterior knee pain, left  Left foot pain     ED Discharge Orders        Ordered    oxyCODONE-acetaminophen (PERCOCET) 7.5-325 MG tablet  Every 6 hours PRN     01/05/18 2116       Note:  This document was prepared using Dragon voice recognition software and may include unintentional dictation errors.    Joni Reining, PA-C 01/05/18 2136    Nita Sickle, MD 01/06/18 [phone]

## 2018-03-15 ENCOUNTER — Other Ambulatory Visit: Payer: Self-pay

## 2018-03-15 ENCOUNTER — Emergency Department
Admission: EM | Admit: 2018-03-15 | Discharge: 2018-03-15 | Disposition: A | Discharge status: Home / Self Care | Payer: Self-pay | Attending: Emergency Medicine | Admitting: Emergency Medicine

## 2018-03-15 ENCOUNTER — Encounter: Payer: Self-pay | Admitting: Emergency Medicine

## 2018-03-15 DIAGNOSIS — F17210 Nicotine dependence, cigarettes, uncomplicated: Secondary | ICD-10-CM | POA: Insufficient documentation

## 2018-03-15 DIAGNOSIS — J45909 Unspecified asthma, uncomplicated: Secondary | ICD-10-CM | POA: Insufficient documentation

## 2018-03-15 DIAGNOSIS — Z79899 Other long term (current) drug therapy: Secondary | ICD-10-CM | POA: Insufficient documentation

## 2018-03-15 DIAGNOSIS — J069 Acute upper respiratory infection, unspecified: Secondary | ICD-10-CM | POA: Insufficient documentation

## 2018-03-15 MED ORDER — ALBUTEROL SULFATE HFA 108 (90 BASE) MCG/ACT IN AERS: 2.0000 | INHALATION_SPRAY | Freq: Four times a day (QID) | RESPIRATORY_TRACT | 0 refills | Status: DC | PRN

## 2018-03-15 MED ORDER — PROMETHAZINE-DM 6.25-15 MG/5ML PO SYRP: 5.0000 mL | ORAL_SOLUTION | Freq: Four times a day (QID) | ORAL | 0 refills | Status: DC | PRN

## 2018-03-15 MED ORDER — AMOXICILLIN-POT CLAVULANATE 875-125 MG PO TABS: 1.0000 | ORAL_TABLET | Freq: Two times a day (BID) | ORAL | 0 refills | Status: DC

## 2018-03-15 MED ORDER — DEXAMETHASONE SODIUM PHOSPHATE 10 MG/ML IJ SOLN
10.0000 mg | Freq: Once | INTRAMUSCULAR | Status: AC
  Administered 2018-03-15: 10 mg via INTRAMUSCULAR
  Filled 2018-03-15: qty 1

## 2018-03-15 NOTE — Discharge Instructions (Addendum)
You have been diagnosed with an upper respiratory infection.  You were given a steroid shot today in the ER.  I have given you a prescription for antibiotics twice a day for 10 days, and albuterol inhaler to use every 4-6 hours as needed, and cough syrup to take every 8 hours as needed.  Follow-up with your PCP as needed if symptoms persist or worsen.

## 2018-03-15 NOTE — ED Triage Notes (Signed)
Congestion and sore throat x 2 weeks.

## 2018-03-15 NOTE — ED Notes (Signed)
Pt states not feeling good for a few weeks and thought it was her seasonal allergies but states now feels worse with cough. No pcp d/t no insurance.

## 2018-03-15 NOTE — ED Provider Notes (Signed)
[facility] Emergency Department Provider Note ____________________________________________  Time seen: 1830  I have reviewed the triage vital signs and the nursing notes.  HISTORY  Chief Complaint  Nasal Congestion and Sore Throat   HPI [name] is a 32 y.o. female presents to the ER with complaint of nasal congestion, sore throat and cough.  She reports this started 2 weeks ago.  She is blowing yellow/green mucus out of her nose.  She denies difficulty swallowing.  The cough is productive of yellow/green mucus.  She reports mild shortness of breath with coughing spells.  She denies runny nose, ear pain or chest pain.  She denies fever, chills or body aches.  She reports a history of allergies and asthma.  She is a 1 PPD smoker.  She has not had sick contacts.  Past Medical History:  Diagnosis Date  . Allergy   . Asthma   . Bipolar 1 disorder (HCC)   . Bipolar disorder (HCC)   . Ectopic pregnancy   . GERD (gastroesophageal reflux disease)   . History of concussion 2016  . Hyperlipidemia   . Migraine   . MRSA (methicillin resistant Staphylococcus aureus)   . Pseudoseizures   . Scoliosis   . Scoliosis     Patient Active Problem List   Diagnosis Date Noted  . Need for diphtheria-tetanus-pertussis (Tdap) vaccine, adult/adolescent 04/05/2015  . Abnormal weight gain 03/12/2015  . Encounter for medication monitoring 03/12/2015  . Abdominal pain, acute, right lower quadrant 03/02/2015  . Bipolar affective disorder (HCC) 01/26/2015  . Convulsion (HCC) 01/25/2015  . Seizures (HCC) 01/12/2015  . Asthma   . Hyperlipidemia   . Bipolar disorder (HCC)   . Allergy   . MRSA (methicillin resistant Staphylococcus aureus)   . Migraine headache with aura     Past Surgical History:  Procedure Laterality Date  . CARPAL TUNNEL RELEASE  2015   left  . HERNIA REPAIR  2010  . TUBAL LIGATION      Prior to Admission medications   Medication Sig Start Date  End Date Taking? Authorizing Provider  albuterol (PROVENTIL HFA;VENTOLIN HFA) 108 (90 Base) MCG/ACT inhaler Inhale 2 puffs into the lungs every 6 (six) hours as needed for wheezing or shortness of breath. 03/15/18   Lorre Munroe, NP  amoxicillin-clavulanate (AUGMENTIN) 875-125 MG tablet Take 1 tablet by mouth 2 (two) times daily. 03/15/18   Lorre Munroe, NP  divalproex (DEPAKOTE) 500 MG DR tablet Take 500 mg by mouth 2 (two) times daily.     [provider]  FLUoxetine (PROZAC) 20 MG tablet Take 20 mg by mouth daily.    [provider]  HYDROXYZINE PAMOATE PO Take by mouth 3 (three) times daily as needed.     [provider]  Multiple Vitamin (MULTIVITAMIN) tablet Take 1 tablet by mouth daily.    [provider]  promethazine-dextromethorphan (PROMETHAZINE-DM) 6.25-15 MG/5ML syrup Take 5 mLs by mouth 4 (four) times daily as needed for cough. 03/15/18   Lorre Munroe, NP  vitamin B-12 (CYANOCOBALAMIN) 1000 MCG tablet Take 1,000 mcg by mouth daily.    [provider]  vitamin C (ASCORBIC ACID) 500 MG tablet Take 500 mg by mouth daily.    [provider]    Allergies Robitussin (alcohol free) [guaifenesin]; Aspirin; Tramadol; and Codeine  Family History  Problem Relation Age of Onset  . Asthma Mother   . Heart disease Mother   . Diabetes Mother   . Hyperlipidemia  Mother   . Hypertension Mother   . Mental illness Mother   . Migraines Mother   . Thyroid disease Mother   . Stroke Mother   . Hypertension Sister   . Cancer Maternal Grandmother   . Diabetes Maternal Grandmother   . Heart disease Maternal Grandmother   . Hyperlipidemia Maternal Grandmother   . Hypertension Maternal Grandmother   . Kidney disease Maternal Grandmother   . Heart disease Paternal Grandfather     Social History Social History   Tobacco Use  . Smoking status: Current Every Day Smoker    Packs/day: 1.00    Types: Cigarettes  . Smokeless tobacco:  Never Used  Substance Use Topics  . Alcohol use: No    Alcohol/week: 0.0 standard drinks  . Drug use: No    Review of Systems  Constitutional: Negative for fever, chills or body aches. ENT: Positive for nasal congestion, sore throat.  Negative for runny nose or ear pain. Cardiovascular: Negative for chest pain. Respiratory: Positive for cough and shortness of breath.   ____________________________________________  PHYSICAL EXAM:  VITAL SIGNS: ED Triage Vitals  Enc Vitals Group     BP 03/15/18 1728 102/79     Pulse Rate 03/15/18 1725 100     Resp 03/15/18 1725 20     Temp 03/15/18 1725 98.3 F (36.8 C)     Temp Source 03/15/18 1725 Oral     SpO2 03/15/18 1725 98 %     Weight 03/15/18 1726 172 lb (78 kg)     Height 03/15/18 1726 5\' 5"  (1.651 m)     Head Circumference --      Peak Flow --      Pain Score 03/15/18 1726 7     Pain Loc --      Pain Edu? --      Excl. in GC? --     Constitutional: Alert and oriented. Well appearing and in no distress. Eyes: Conjunctivae are normal. PERRL. Normal extraocular movements Ears: Canals clear. TMs intact bilaterally. Nose: Mucosa boggy, turbinates swollen. Mouth/Throat: Mucous membranes are erythematous but moist. + PND. Hematological/Lymphatic/Immunological: No cervical lymphadenopathy. Cardiovascular: Normal rate, regular rhythm.  Respiratory: Normal respiratory effort.  Intermittent expiratory wheeze bilaterally but no rales or rhonchi noted. ____________________________________________  INITIAL IMPRESSION / ASSESSMENT AND PLAN / ED COURSE  Acute Upper Respiratory Infection:  Decadron 10 mg IM today for sore throat/wheezing RX provided for Augmentin BID x 10 days RX provided for Albuterol inhaler every 4-6 hours as needed RX provided for Promethazine DM cough syrup ____________________________________________  FINAL CLINICAL IMPRESSION(S) / ED DIAGNOSES  Final diagnoses:  Upper respiratory infection with cough and  congestion      Lorre MunroeBaity, Bryan Goin W, NP 03/15/18 1846    Sharman CheekStafford, Phillip, MD 03/16/18 1919

## 2018-05-21 ENCOUNTER — Other Ambulatory Visit: Payer: Self-pay

## 2018-05-21 ENCOUNTER — Emergency Department
Admission: EM | Admit: 2018-05-21 | Discharge: 2018-05-21 | Disposition: A | Discharge status: Home / Self Care | Payer: Self-pay | Attending: Emergency Medicine | Admitting: Emergency Medicine

## 2018-05-21 ENCOUNTER — Encounter: Payer: Self-pay | Admitting: Emergency Medicine

## 2018-05-21 DIAGNOSIS — Z79899 Other long term (current) drug therapy: Secondary | ICD-10-CM | POA: Insufficient documentation

## 2018-05-21 DIAGNOSIS — J45909 Unspecified asthma, uncomplicated: Secondary | ICD-10-CM | POA: Insufficient documentation

## 2018-05-21 DIAGNOSIS — F17210 Nicotine dependence, cigarettes, uncomplicated: Secondary | ICD-10-CM | POA: Insufficient documentation

## 2018-05-21 DIAGNOSIS — J020 Streptococcal pharyngitis: Secondary | ICD-10-CM | POA: Insufficient documentation

## 2018-05-21 DIAGNOSIS — J040 Acute laryngitis: Secondary | ICD-10-CM | POA: Insufficient documentation

## 2018-05-21 DIAGNOSIS — R05 Cough: Secondary | ICD-10-CM | POA: Insufficient documentation

## 2018-05-21 MED ORDER — BENZONATATE 100 MG PO CAPS: 100.0000 mg | ORAL_CAPSULE | Freq: Three times a day (TID) | ORAL | 0 refills | Status: AC | PRN

## 2018-05-21 MED ORDER — DEXAMETHASONE SODIUM PHOSPHATE 10 MG/ML IJ SOLN
10.0000 mg | Freq: Once | INTRAMUSCULAR | Status: AC
  Administered 2018-05-21: 10 mg via INTRAMUSCULAR
  Filled 2018-05-21: qty 1

## 2018-05-21 MED ORDER — AMOXICILLIN 500 MG PO TABS: 500.0000 mg | ORAL_TABLET | Freq: Two times a day (BID) | ORAL | 0 refills | Status: AC

## 2018-05-21 NOTE — ED Provider Notes (Signed)
[facility] Emergency Department Provider Note  ____________________________________________  Time seen: Approximately 7:29 PM  I have reviewed the triage vital signs and the nursing notes.   HISTORY  Chief Complaint Cough and Sore Throat    HPI [name] is a 32 y.o. female presents to the emergency department with laryngitis, pharyngitis, nonproductive cough, rhinorrhea and congestion for the past 3 days.  Patient's daughter has experienced similar symptoms.  Patient is tolerating fluids by mouth and managing her own secretions.  She is able to speak in complete sentences.  No associated diarrhea, vomiting, or abdominal pain.  No recent travel.  Patient has not taken her temperature at home.   Past Medical History:  Diagnosis Date  . Allergy   . Asthma   . Bipolar 1 disorder (HCC)   . Bipolar disorder (HCC)   . Ectopic pregnancy   . GERD (gastroesophageal reflux disease)   . History of concussion 2016  . Hyperlipidemia   . Migraine   . MRSA (methicillin resistant Staphylococcus aureus)   . Pseudoseizures   . Scoliosis   . Scoliosis     Patient Active Problem List   Diagnosis Date Noted  . Need for diphtheria-tetanus-pertussis (Tdap) vaccine, adult/adolescent 04/05/2015  . Abnormal weight gain 03/12/2015  . Encounter for medication monitoring 03/12/2015  . Abdominal pain, acute, right lower quadrant 03/02/2015  . Bipolar affective disorder (HCC) 01/26/2015  . Convulsion (HCC) 01/25/2015  . Seizures (HCC) 01/12/2015  . Asthma   . Hyperlipidemia   . Bipolar disorder (HCC)   . Allergy   . MRSA (methicillin resistant Staphylococcus aureus)   . Migraine headache with aura     Past Surgical History:  Procedure Laterality Date  . CARPAL TUNNEL RELEASE  2015   left  . HERNIA REPAIR  2010  . TUBAL LIGATION      Prior to Admission medications   Medication Sig Start Date End Date Taking? Authorizing Provider  albuterol (PROVENTIL  HFA;VENTOLIN HFA) 108 (90 Base) MCG/ACT inhaler Inhale 2 puffs into the lungs every 6 (six) hours as needed for wheezing or shortness of breath. 03/15/18   Baity, Salvadore Oxfordegina W, NP  amoxicillin (AMOXIL) 500 MG tablet Take 1 tablet (500 mg total) by mouth 2 (two) times daily for 7 days. 05/21/18 05/28/18  Orvil FeilWoods, Exie Chrismer M, PA-C  amoxicillin-clavulanate (AUGMENTIN) 875-125 MG tablet Take 1 tablet by mouth 2 (two) times daily. 03/15/18   Lorre MunroeBaity, Regina W, NP  benzonatate (TESSALON PERLES) 100 MG capsule Take 1 capsule (100 mg total) by mouth 3 (three) times daily as needed for up to 7 days for cough. 05/21/18 05/28/18  Orvil FeilWoods, Mateja Dier M, PA-C  divalproex (DEPAKOTE) 500 MG DR tablet Take 500 mg by mouth 2 (two) times daily.     [provider]  FLUoxetine (PROZAC) 20 MG tablet Take 20 mg by mouth daily.    [provider]  HYDROXYZINE PAMOATE PO Take by mouth 3 (three) times daily as needed.     [provider]  Multiple Vitamin (MULTIVITAMIN) tablet Take 1 tablet by mouth daily.    [provider]  promethazine-dextromethorphan (PROMETHAZINE-DM) 6.25-15 MG/5ML syrup Take 5 mLs by mouth 4 (four) times daily as needed for cough. 03/15/18   Lorre MunroeBaity, Regina W, NP  vitamin B-12 (CYANOCOBALAMIN) 1000 MCG tablet Take 1,000 mcg by mouth daily.    [provider]  vitamin C (ASCORBIC ACID) 500 MG tablet Take 500 mg by mouth daily.    [provider]  Allergies Robitussin (alcohol free) [guaifenesin]; Aspirin; Tramadol; and Codeine  Family History  Problem Relation Age of Onset  . Asthma Mother   . Heart disease Mother   . Diabetes Mother   . Hyperlipidemia Mother   . Hypertension Mother   . Mental illness Mother   . Migraines Mother   . Thyroid disease Mother   . Stroke Mother   . Hypertension Sister   . Cancer Maternal Grandmother   . Diabetes Maternal Grandmother   . Heart disease Maternal Grandmother   . Hyperlipidemia Maternal Grandmother   .  Hypertension Maternal Grandmother   . Kidney disease Maternal Grandmother   . Heart disease Paternal Grandfather     Social History Social History   Tobacco Use  . Smoking status: Current Every Day Smoker    Packs/day: 1.00    Types: Cigarettes  . Smokeless tobacco: Never Used  Substance Use Topics  . Alcohol use: No    Alcohol/week: 0.0 standard drinks  . Drug use: No    Review of Systems  Constitutional: Patient has been afebrile.  Eyes: No visual changes. No discharge ENT: Patient has congestion and pharyngitis.  Cardiovascular: no chest pain. Respiratory: Patient has cough.  Gastrointestinal: No abdominal pain.  No nausea, no vomiting. Patient had diarrhea.  Genitourinary: Negative for dysuria. No hematuria  Skin: Negative for rash, abrasions, lacerations, ecchymosis. Neurological: Patient has headache, no focal weakness or numbness.     ____________________________________________   PHYSICAL EXAM:  VITAL SIGNS: ED Triage Vitals  Enc Vitals Group     BP 05/21/18 1837 111/63     Pulse Rate 05/21/18 1837 86     Resp 05/21/18 1837 18     Temp 05/21/18 1837 98 F (36.7 C)     Temp Source 05/21/18 1837 Oral     SpO2 05/21/18 1837 97 %     Weight --      Height 05/21/18 1828 5\' 6"  (1.676 m)     Head Circumference --      Peak Flow --      Pain Score 05/21/18 1828 6     Pain Loc --      Pain Edu? --      Excl. in GC? --      Constitutional: Alert and oriented. Patient is lying supine. Eyes: Conjunctivae are normal. PERRL. EOMI. Head: Atraumatic. ENT:      Ears: Tympanic membranes are mildly injected with mild effusion bilaterally.       Nose: No congestion/rhinnorhea.      Mouth/Throat: Mucous membranes are moist. Posterior pharynx is mildly erythematous.  Hematological/Lymphatic/Immunilogical: No cervical lymphadenopathy.  Cardiovascular: Normal rate, regular rhythm. Normal S1 and S2.  Good peripheral circulation. Respiratory: Normal respiratory  effort without tachypnea or retractions. Lungs CTAB. Good air entry to the bases with no decreased or absent breath sounds. Gastrointestinal: Bowel sounds 4 quadrants. Soft and nontender to palpation. No guarding or rigidity. No palpable masses. No distention. No CVA tenderness. Musculoskeletal: Full range of motion to all extremities. No gross deformities appreciated. Neurologic:  Normal speech and language. No gross focal neurologic deficits are appreciated.  Skin:  Skin is warm, dry and intact. No rash noted. Psychiatric: Mood and affect are normal. Speech and behavior are normal. Patient exhibits appropriate insight and judgement.   ____________________________________________   LABS (all labs ordered are listed, but only abnormal results are displayed)  Labs Reviewed - No data to display ____________________________________________  EKG   ____________________________________________  RADIOLOGY  No results  found.  ____________________________________________    PROCEDURES  Procedure(s) performed:    Procedures    Medications  dexamethasone (DECADRON) injection 10 mg (10 mg Intramuscular Given 05/21/18 2001)     ____________________________________________   INITIAL IMPRESSION / ASSESSMENT AND PLAN / ED COURSE  Pertinent labs & imaging results that were available during my care of the patient were reviewed by me and considered in my medical decision making (see chart for details).  Review of the [place] CSRS was performed in accordance of the [facility] prior to dispensing any controlled drugs.      Assessment and plan: Laryngitis:  Pharyngitis Patient presents to the emergency department with rhinorrhea, congestion and nonproductive cough for the past 2 to 3 days.  I suspect unspecified viral URI.  Patient reports that her pharyngitis is bothering her the most.  Patient's 93-year-old daughter tested positive for group A strep in the emergency department.  Patient was  given an injection of Decadron in the emergency department and she was also treated empirically for group A strep.  Rest and hydration were encouraged.  Patient was advised to follow-up with primary care as needed.     ____________________________________________  FINAL CLINICAL IMPRESSION(S) / ED DIAGNOSES  Final diagnoses:  Laryngitis  Strep pharyngitis      NEW MEDICATIONS STARTED DURING THIS VISIT:  ED Discharge Orders         Ordered    amoxicillin (AMOXIL) 500 MG tablet  2 times daily     05/21/18 2038    benzonatate (TESSALON PERLES) 100 MG capsule  3 times daily PRN     05/21/18 2057              This chart was dictated using voice recognition software/Dragon. Despite best efforts to proofread, errors can occur which can change the meaning. Any change was purely unintentional.    Gasper Lloyd 05/21/18 2127    Phineas Semen, MD 05/21/18 2224

## 2018-05-21 NOTE — ED Triage Notes (Signed)
Patient presents to the ED for cough and sore throat x 3 days.  Patient's daughter has similar symptoms.

## 2018-07-07 ENCOUNTER — Emergency Department
Admission: EM | Admit: 2018-07-07 | Discharge: 2018-07-07 | Disposition: A | Discharge status: Home / Self Care | Payer: PRIVATE HEALTH INSURANCE | Attending: Emergency Medicine | Admitting: Emergency Medicine

## 2018-07-07 ENCOUNTER — Encounter: Payer: Self-pay | Admitting: Emergency Medicine

## 2018-07-07 ENCOUNTER — Other Ambulatory Visit: Payer: Self-pay

## 2018-07-07 DIAGNOSIS — H9201 Otalgia, right ear: Secondary | ICD-10-CM | POA: Diagnosis present

## 2018-07-07 DIAGNOSIS — F17210 Nicotine dependence, cigarettes, uncomplicated: Secondary | ICD-10-CM | POA: Insufficient documentation

## 2018-07-07 DIAGNOSIS — R51 Headache: Secondary | ICD-10-CM | POA: Insufficient documentation

## 2018-07-07 DIAGNOSIS — J45909 Unspecified asthma, uncomplicated: Secondary | ICD-10-CM | POA: Insufficient documentation

## 2018-07-07 DIAGNOSIS — Z79899 Other long term (current) drug therapy: Secondary | ICD-10-CM | POA: Diagnosis not present

## 2018-07-07 DIAGNOSIS — R519 Headache, unspecified: Secondary | ICD-10-CM

## 2018-07-07 MED ORDER — AMOXICILLIN 500 MG PO CAPS: 500.0000 mg | ORAL_CAPSULE | Freq: Three times a day (TID) | ORAL | 0 refills | Status: DC

## 2018-07-07 NOTE — ED Triage Notes (Signed)
R earache x 5 days.

## 2018-07-07 NOTE — ED Provider Notes (Signed)
[facility] Emergency Department Provider Note  ____________________________________________  Time seen: Approximately 2:21 PM  I have reviewed the triage vital signs and the nursing notes.   HISTORY  Chief Complaint Otalgia    HPI [name] is a 33 y.o. female that presents to the emergency department for evaluation of right ear pain for 5 days.  Patient states the pain is primarily right in front and behind her ear and along her back jaw.  Pain is worse with swallowing.  She thought maybe she had a sinus infection and is taken her sinus medications without relief.  Patient has not seen a dentist in a long time.  No fever, chills, dental pain.   Past Medical History:  Diagnosis Date  . Allergy   . Asthma   . Bipolar 1 disorder (HCC)   . Bipolar disorder (HCC)   . Ectopic pregnancy   . GERD (gastroesophageal reflux disease)   . History of concussion 2016  . Hyperlipidemia   . Migraine   . MRSA (methicillin resistant Staphylococcus aureus)   . Pseudoseizures   . Scoliosis   . Scoliosis     Patient Active Problem List   Diagnosis Date Noted  . Need for diphtheria-tetanus-pertussis (Tdap) vaccine, adult/adolescent 04/05/2015  . Abnormal weight gain 03/12/2015  . Encounter for medication monitoring 03/12/2015  . Abdominal pain, acute, right lower quadrant 03/02/2015  . Bipolar affective disorder (HCC) 01/26/2015  . Convulsion (HCC) 01/25/2015  . Seizures (HCC) 01/12/2015  . Asthma   . Hyperlipidemia   . Bipolar disorder (HCC)   . Allergy   . MRSA (methicillin resistant Staphylococcus aureus)   . Migraine headache with aura     Past Surgical History:  Procedure Laterality Date  . CARPAL TUNNEL RELEASE  2015   left  . HERNIA REPAIR  2010  . TUBAL LIGATION      Prior to Admission medications   Medication Sig Start Date End Date Taking? Authorizing Provider  albuterol (PROVENTIL HFA;VENTOLIN HFA) 108 (90 Base) MCG/ACT inhaler Inhale  2 puffs into the lungs every 6 (six) hours as needed for wheezing or shortness of breath. 03/15/18   Baity, Salvadore Oxfordegina W, NP  amoxicillin (AMOXIL) 500 MG capsule Take 1 capsule (500 mg total) by mouth 3 (three) times daily. 07/07/18   Enid DerryWagner, Gennette Shadix, PA-C  amoxicillin-clavulanate (AUGMENTIN) 875-125 MG tablet Take 1 tablet by mouth 2 (two) times daily. 03/15/18   Lorre MunroeBaity, Regina W, NP  divalproex (DEPAKOTE) 500 MG DR tablet Take 500 mg by mouth 2 (two) times daily.     [provider]  FLUoxetine (PROZAC) 20 MG tablet Take 20 mg by mouth daily.    [provider]  HYDROXYZINE PAMOATE PO Take by mouth 3 (three) times daily as needed.     [provider]  Multiple Vitamin (MULTIVITAMIN) tablet Take 1 tablet by mouth daily.    [provider]  promethazine-dextromethorphan (PROMETHAZINE-DM) 6.25-15 MG/5ML syrup Take 5 mLs by mouth 4 (four) times daily as needed for cough. 03/15/18   Lorre MunroeBaity, Regina W, NP  vitamin B-12 (CYANOCOBALAMIN) 1000 MCG tablet Take 1,000 mcg by mouth daily.    [provider]  vitamin C (ASCORBIC ACID) 500 MG tablet Take 500 mg by mouth daily.    [provider]    Allergies Robitussin (alcohol free) [guaifenesin]; Aspirin; Tramadol; and Codeine  Family History  Problem Relation Age of Onset  . Asthma Mother   . Heart disease Mother   . Diabetes Mother   .  Hyperlipidemia Mother   . Hypertension Mother   . Mental illness Mother   . Migraines Mother   . Thyroid disease Mother   . Stroke Mother   . Hypertension Sister   . Cancer Maternal Grandmother   . Diabetes Maternal Grandmother   . Heart disease Maternal Grandmother   . Hyperlipidemia Maternal Grandmother   . Hypertension Maternal Grandmother   . Kidney disease Maternal Grandmother   . Heart disease Paternal Grandfather     Social History Social History   Tobacco Use  . Smoking status: Current Every Day Smoker    Packs/day: 1.00    Types: Cigarettes  .  Smokeless tobacco: Never Used  Substance Use Topics  . Alcohol use: No    Alcohol/week: 0.0 standard drinks  . Drug use: No     Review of Systems  Constitutional: No fever/chills ENT: No upper respiratory complaints. Cardiovascular: No chest pain. Respiratory: No cough. No SOB. Gastrointestinal:  No nausea, no vomiting.  Musculoskeletal: Negative for musculoskeletal pain. Skin: Negative for rash, abrasions, lacerations, ecchymosis. Neurological: Negative for headaches   ____________________________________________   PHYSICAL EXAM:  VITAL SIGNS: ED Triage Vitals [07/07/18 1225]  Enc Vitals Group     BP 128/79     Pulse Rate 73     Resp 18     Temp 98.5 F (36.9 C)     Temp Source Oral     SpO2 99 %     Weight 175 lb (79.4 kg)     Height 5\' 5"  (1.651 m)     Head Circumference      Peak Flow      Pain Score 10     Pain Loc      Pain Edu?      Excl. in GC?      Constitutional: Alert and oriented. Well appearing and in no acute distress. Eyes: Conjunctivae are normal. PERRL. EOMI. Head: Atraumatic. ENT:      Ears: Tympanic membranes are pearly.  Right tympanic membrane is bulging.  Yellow fluid behind right tympanic membrane.      Nose: No congestion/rhinnorhea.      Mouth/Throat: Mucous membranes are moist.  Poor dentition.  Large cavity to right back molar. Neck: No stridor.   Cardiovascular: Normal rate, regular rhythm.  Good peripheral circulation. Respiratory: Normal respiratory effort without tachypnea or retractions. Lungs CTAB. Good air entry to the bases with no decreased or absent breath sounds. Musculoskeletal: Full range of motion to all extremities. No gross deformities appreciated. Neurologic:  Normal speech and language. No gross focal neurologic deficits are appreciated.  Skin:  Skin is warm, dry and intact. No rash noted. Psychiatric: Mood and affect are normal. Speech and behavior are normal. Patient exhibits appropriate insight and  judgement.   ____________________________________________   LABS (all labs ordered are listed, but only abnormal results are displayed)  Labs Reviewed - No data to display ____________________________________________  EKG   ____________________________________________  RADIOLOGY   No results found.  ____________________________________________    PROCEDURES  Procedure(s) performed:    Procedures    Medications - No data to display   ____________________________________________   INITIAL IMPRESSION / ASSESSMENT AND PLAN / ED COURSE  Pertinent labs & imaging results that were available during my care of the patient were reviewed by me and considered in my medical decision making (see chart for details).  Review of the [place] CSRS was performed in accordance of the N[facility] prior to dispensing any controlled drugs.  Patient presented emergency department for evaluation of earache for 5 days.  Vital signs and exam are reassuring.  Patient does have some yellow fluid behind right tympanic membrane.  She is also has a large cavity to her right back molar along the same distribution of pain.  She will be covered for your infection and dental infection.  Patient will be discharged home with prescriptions for amoxicillin. Patient is to follow up with primary care and dentist as directed. Patient is given ED precautions to return to the ED for any worsening or new symptoms.     ____________________________________________  FINAL CLINICAL IMPRESSION(S) / ED DIAGNOSES  Final diagnoses:  Facial pain  Right ear pain      NEW MEDICATIONS STARTED DURING THIS VISIT:  ED Discharge Orders         Ordered    amoxicillin (AMOXIL) 500 MG capsule  3 times daily     07/07/18 1443              This chart was dictated using voice recognition software/Dragon. Despite best efforts to proofread, errors can occur which can change the meaning. Any change was purely  unintentional.    Enid Derry, PA-C 07/07/18 1521    Arnaldo Natal, MD 07/07/18 [phone]

## 2018-07-07 NOTE — ED Notes (Signed)
See triage note  States she developed pain to right ear last week   Thought she had a sinus infection  Has tried OTC meds with min relief    States pain has increased  With some draiange

## 2018-07-07 NOTE — Discharge Instructions (Signed)
OPTIONS FOR DENTAL FOLLOW UP CARE ° °[place] Department of Health and Human Services - Local Safety Net Dental Clinics °http://www.ncdhhs.gov/dph/oralhealth/services/safetynetclinics.htm °  °[facility] (336-562-3123) ° °[facility] [place] ([phone]) ° °[facility] ([phone] ext [phone]) ° °[facility] ([phone]) ° °[facility] ([phone]) °This clinic caters to the indigent population and is on a lottery system. °Location: °[facility], [address], [place] °Clinic Hours: °Wednesdays from 6pm - 9pm, patients seen by a lottery system. °For dates, call or go to [email or web address] °Services: °Cleanings, fillings and simple extractions. °Payment Options: °DENTAL WORK IS FREE OF CHARGE. Bring proof of income or support. °Best way to get seen: °Arrive at 5:15 pm - this is a lottery, NOT first come/first serve, so arriving earlier will not increase your chances of being seen. °  °  °[facility] °[phone] °Select option 1 for emergencies °  °Location: °[facility], [address], [place] °Clinic Hours: °No walk-ins accepted - call the day before to schedule an appointment. °Check in times are 9:30 am and 1:30 pm. °Services: °Simple extractions, temporary fillings, pulpectomy/pulp debridement, uncomplicated abscess drainage. °Payment Options: °PAYMENT IS DUE AT THE TIME OF SERVICE.  Fee is usually $100-200, additional surgical procedures (e.g. abscess drainage) may be extra. °Cash, checks, Visa/MasterCard accepted.  Can file Medicaid if patient is covered for dental - patient should call case worker to check. °No discount for [facility] patients. °Best way to get seen: °MUST call the day before and get onto the schedule. Can usually be seen the next 1-2 days. No walk-ins accepted. °  °  °[facility] °[phone] °   °Location: °[facility], [address], [place] °Clinic Hours: °M, W, Th, F 8am or 1:30pm, Tues 9a or 1:30 - first come/first served. °Services: °Simple extractions, temporary fillings, uncomplicated abscess drainage.  You do not need to be an [place] resident. °Payment Options: °PAYMENT IS DUE AT THE TIME OF SERVICE. °Dental insurance, otherwise sliding scale - bring proof of income or support. °Depending on income and treatment needed, cost is usually $50-200. °Best way to get seen: °Arrive early as it is first come/first served. °  °  °[facility] °[phone] °  °Location: °[address] °Clinic Hours: °Mon-Thu 8a-5p °Services: °Most basic dental services including extractions and fillings. °Payment Options: °PAYMENT IS DUE AT THE TIME OF SERVICE. °Sliding scale, up to 50% off - bring proof if income or support. °Medicaid with dental option accepted. °Best way to get seen: °Call to schedule an appointment, can usually be seen within 2 weeks OR they will try to see walk-ins - show up at 8a or 2p (you may have to wait). °  °  °[facility] °919-245-2435 °[place] RESIDENTS ONLY °  °Location: °[facility], [address], [place], [place] [address] °Clinic Hours: By appointment only. °Monday - Thursday 8am-5pm, Friday 8am-12pm °Services: Cleanings, fillings, extractions. °Payment Options: °PAYMENT IS DUE AT THE TIME OF SERVICE. °Cash, Visa or MasterCard. Sliding scale - $30 minimum per service. °Best way to get seen: °Come in to office, complete packet and make an appointment - need proof of income °or support monies for each household member and proof of [place] residence. °Usually takes about a month to get in. °  °  °[facility] °[phone] °  °Location: °[address]   [place] °Clinic Hours: Walk-in Urgent Care Dental Services are offered Monday-Friday  mornings only. °The numbers of emergencies accepted daily is limited to the number of °providers available. °Maximum 15 - Mondays, Wednesdays & Thursdays °Maximum 10 - Tuesdays & Fridays °Services: °You do not need to be a [place] resident to be seen for a dental emergency. °Emergencies are defined as pain, swelling, abnormal bleeding, or dental trauma. Walkins will receive x-rays if needed. °NOTE: Dental cleaning is not an emergency. °Payment Options: °PAYMENT IS DUE AT THE TIME OF SERVICE. °Minimum co-pay is $40.00 for uninsured patients. °Minimum co-pay is $3.00 for Medicaid with dental coverage. °Dental Insurance is accepted and must be presented at time of visit. °Medicare does not cover dental. °Forms of payment: Cash, credit card, checks. °Best way to get seen: °If not previously registered with the clinic, walk-in dental registration begins at 7:15 am and is on a first come/first serve basis. °If previously registered with the clinic, call to make an appointment. °  °  °[facility] °[phone] °[place] RESIDENTS ONLY °  °Location: °[address], [place], [place] °Clinic Hours: °Mon-Thu 10a-2p °Services: Extractions only! °Payment Options: °FREE (donations accepted) - bring proof of income or support °Best way to get seen: °Call and schedule an appointment OR come at 8am on the 1st Monday of every month (except for holidays) when it is first come/first served. °  °  °[facility] °[phone] °  °Location: °[address], [place] °Clinic Hours: °Friday mornings °Services, Payment Options, Best way to get seen: °Call for info °

## 2018-08-08 ENCOUNTER — Encounter: Payer: Self-pay | Admitting: Emergency Medicine

## 2018-08-08 ENCOUNTER — Emergency Department: Payer: PRIVATE HEALTH INSURANCE

## 2018-08-08 ENCOUNTER — Emergency Department
Admission: EM | Admit: 2018-08-08 | Discharge: 2018-08-08 | Disposition: A | Discharge status: Home / Self Care | Payer: PRIVATE HEALTH INSURANCE | Attending: Emergency Medicine | Admitting: Emergency Medicine

## 2018-08-08 DIAGNOSIS — F17210 Nicotine dependence, cigarettes, uncomplicated: Secondary | ICD-10-CM | POA: Insufficient documentation

## 2018-08-08 DIAGNOSIS — Z79899 Other long term (current) drug therapy: Secondary | ICD-10-CM | POA: Insufficient documentation

## 2018-08-08 DIAGNOSIS — J45909 Unspecified asthma, uncomplicated: Secondary | ICD-10-CM | POA: Insufficient documentation

## 2018-08-08 DIAGNOSIS — M654 Radial styloid tenosynovitis [de Quervain]: Secondary | ICD-10-CM | POA: Insufficient documentation

## 2018-08-08 MED ORDER — MELOXICAM 15 MG PO TABS: 15.0000 mg | ORAL_TABLET | Freq: Every day | ORAL | 0 refills | Status: DC

## 2018-08-08 NOTE — ED Provider Notes (Signed)
[facility] Emergency Department Provider Note  ____________________________________________  Time seen: Approximately 8:19 PM  I have reviewed the triage vital signs and the nursing notes.   HISTORY  Chief Complaint Wrist Pain    HPI [name] is a 33 y.o. female who presents emergency department complaining of pain to the left wrist just proximal to the MCP joint of the thumb.  Patient reports that she is unsure whether she has injured it from repetitive motions at work versus a fall approximately a week ago.  Patient reports that a week ago she fell landed on both of her wrist.  She denies any pain complaint initially after the fall.  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  No radicular symptoms into the hand.  No other injury or complaint.  No medications worse complaint prior to arrival.    Past Medical History:  Diagnosis Date  . Allergy   . Asthma   . Bipolar 1 disorder (HCC)   . Bipolar disorder (HCC)   . Ectopic pregnancy   . GERD (gastroesophageal reflux disease)   . History of concussion 2016  . Hyperlipidemia   . Migraine   . MRSA (methicillin resistant Staphylococcus aureus)   . Pseudoseizures   . Scoliosis   . Scoliosis     Patient Active Problem List   Diagnosis Date Noted  . Need for diphtheria-tetanus-pertussis (Tdap) vaccine, adult/adolescent 04/05/2015  . Abnormal weight gain 03/12/2015  . Encounter for medication monitoring 03/12/2015  . Abdominal pain, acute, right lower quadrant 03/02/2015  . Bipolar affective disorder (HCC) 01/26/2015  . Convulsion (HCC) 01/25/2015  . Seizures (HCC) 01/12/2015  . Asthma   . Hyperlipidemia   . Bipolar disorder (HCC)   . Allergy   . MRSA (methicillin resistant Staphylococcus aureus)   . Migraine headache with aura     Past Surgical History:  Procedure Laterality  Date  . CARPAL TUNNEL RELEASE  2015   left  . HERNIA REPAIR  2010  . TUBAL LIGATION      Prior to Admission medications   Medication Sig Start Date End Date Taking? Authorizing Provider  albuterol (PROVENTIL HFA;VENTOLIN HFA) 108 (90 Base) MCG/ACT inhaler Inhale 2 puffs into the lungs every 6 (six) hours as needed for wheezing or shortness of breath. 03/15/18   Baity, Salvadore Oxfordegina W, NP  amoxicillin (AMOXIL) 500 MG capsule Take 1 capsule (500 mg total) by mouth 3 (three) times daily. 07/07/18   Enid DerryWagner, Ashley, PA-C  amoxicillin-clavulanate (AUGMENTIN) 875-125 MG tablet Take 1 tablet by mouth 2 (two) times daily. 03/15/18   Lorre MunroeBaity, Regina W, NP  divalproex (DEPAKOTE) 500 MG DR tablet Take 500 mg by mouth 2 (two) times daily.     [provider]  FLUoxetine (PROZAC) 20 MG tablet Take 20 mg by mouth daily.    [provider]  HYDROXYZINE PAMOATE PO Take by mouth 3 (three) times daily as needed.     [provider]  meloxicam (MOBIC) 15 MG tablet Take 1 tablet (15 mg total) by mouth daily. 08/08/18   Wilmarie Sparlin, Delorise RoyalsJonathan D, PA-C  Multiple Vitamin (MULTIVITAMIN) tablet Take 1 tablet by mouth daily.    [provider]  promethazine-dextromethorphan (PROMETHAZINE-DM) 6.25-15 MG/5ML syrup Take 5 mLs by mouth 4 (four) times daily as needed for cough. 03/15/18   Lorre MunroeBaity, Regina W, NP  vitamin B-12 (CYANOCOBALAMIN) 1000 MCG tablet Take  1,000 mcg by mouth daily.    [provider]  vitamin C (ASCORBIC ACID) 500 MG tablet Take 500 mg by mouth daily.    [provider]    Allergies Robitussin (alcohol free) [guaifenesin]; Aspirin; Tramadol; and Codeine  Family History  Problem Relation Age of Onset  . Asthma Mother   . Heart disease Mother   . Diabetes Mother   . Hyperlipidemia Mother   . Hypertension Mother   . Mental illness Mother   . Migraines Mother   . Thyroid disease Mother   . Stroke Mother   . Hypertension Sister   . Cancer Maternal Grandmother    . Diabetes Maternal Grandmother   . Heart disease Maternal Grandmother   . Hyperlipidemia Maternal Grandmother   . Hypertension Maternal Grandmother   . Kidney disease Maternal Grandmother   . Heart disease Paternal Grandfather     Social History Social History   Tobacco Use  . Smoking status: Current Every Day Smoker    Packs/day: 1.00    Types: Cigarettes  . Smokeless tobacco: Never Used  Substance Use Topics  . Alcohol use: No    Alcohol/week: 0.0 standard drinks  . Drug use: No     Review of Systems  Constitutional: No fever/chills Eyes: No visual changes. Cardiovascular: no chest pain. Respiratory: no cough. No SOB. Gastrointestinal: No abdominal pain.  No nausea, no vomiting.  Musculoskeletal: Positive for left wrist pain Skin: Negative for rash, abrasions, lacerations, ecchymosis. Neurological: Negative for headaches, focal weakness or numbness. 10-point ROS otherwise negative.  ____________________________________________   PHYSICAL EXAM:  VITAL SIGNS: ED Triage Vitals  Enc Vitals Group     BP 08/08/18 1854 117/69     Pulse Rate 08/08/18 1854 94     Resp 08/08/18 1854 18     Temp 08/08/18 1854 98.2 F (36.8 C)     Temp Source 08/08/18 1854 Oral     SpO2 08/08/18 1854 100 %     Weight 08/08/18 1855 175 lb (79.4 kg)     Height 08/08/18 1855 5\' 5"  (1.651 m)     Head Circumference --      Peak Flow --      Pain Score 08/08/18 1855 8     Pain Loc --      Pain Edu? --      Excl. in GC? --      Constitutional: Alert and oriented. Well appearing and in no acute distress. Eyes: Conjunctivae are normal. PERRL. EOMI. Head: Atraumatic. Neck: No stridor.    Cardiovascular: Normal rate, regular rhythm. Normal S1 and S2.  Good peripheral circulation. Respiratory: Normal respiratory effort without tachypnea or retractions. Lungs CTAB. Good air entry to the bases with no decreased or absent breath sounds. Musculoskeletal: Full range of motion to all  extremities. No gross deformities appreciated.  Visualization of the left wrist and hand reveals no gross signs of trauma with lacerations, abrasions, ecchymosis, edema or deformity.  Patient has full range of motion to the wrist and all 5 digits.  Patient is tender to palpation along the extensor pollicis longus.  Patient does have a positive Finkelstein's test.  No other significant findings on physical exam. Neurologic:  Normal speech and language. No gross focal neurologic deficits are appreciated.  Skin:  Skin is warm, dry and intact. No rash noted. Psychiatric: Mood and affect are normal. Speech and behavior are normal. Patient exhibits appropriate insight and judgement.   ____________________________________________   LABS (all labs ordered are  listed, but only abnormal results are displayed)  Labs Reviewed - No data to display ____________________________________________  EKG   ____________________________________________  RADIOLOGY I personally viewed and evaluated these images as part of my medical decision making, as well as reviewing the written report by the radiologist.  I concur with radiologist finding of no acute osseous abnormality to the left wrist.  Dg Wrist Complete Left  Result Date: 08/08/2018 CLINICAL DATA:  Left wrist pain starting today. Patient fell 1 week ago. EXAM: LEFT WRIST - COMPLETE 3+ VIEW COMPARISON:  None. FINDINGS: There is no evidence of fracture or dislocation. There is no evidence of arthropathy or other focal bone abnormality. Soft tissues are unremarkable. IMPRESSION: No acute osseous abnormality. Electronically Signed   By: Tollie Eth M.D.   On: 08/08/2018 19:56    ____________________________________________    PROCEDURES  Procedure(s) performed:    Procedures    Medications - No data to display   ____________________________________________   INITIAL IMPRESSION / ASSESSMENT AND PLAN / ED COURSE  Pertinent labs & imaging  results that were available during my care of the patient were reviewed by me and considered in my medical decision making (see chart for details).  Review of the [place] CSRS was performed in accordance of the [facility] prior to dispensing any controlled drugs.      Patient's diagnosis is consistent with de Quervain's tenosynovitis.  Patient presents emergency department with left wrist/thumb pain.  On exam, findings are consistent with de Quervain's tenosynovitis.  X-ray reveals no acute osseous abnormality.  Patient will be placed on meloxicam for symptom improvement..  Follow-up with orthopedics if symptoms persist.  Patient is given ED precautions to return to the ED for any worsening or new symptoms.     ____________________________________________  FINAL CLINICAL IMPRESSION(S) / ED DIAGNOSES  Final diagnoses:  De Quervain's tenosynovitis, left      NEW MEDICATIONS STARTED DURING THIS VISIT:  ED Discharge Orders         Ordered    meloxicam (MOBIC) 15 MG tablet  Daily     08/08/18 2028              This chart was dictated using voice recognition software/Dragon. Despite best efforts to proofread, errors can occur which can change the meaning. Any change was purely unintentional.    Lanette [place] 08/08/18 2028    Sharman Cheek, MD 08/18/18 [phone]

## 2018-08-08 NOTE — ED Triage Notes (Signed)
Pt to ED with c/o of left wrist pain that started today. Pt states she fell approx 1 week ago.

## 2018-09-07 ENCOUNTER — Encounter: Payer: Self-pay | Admitting: Emergency Medicine

## 2018-09-07 ENCOUNTER — Other Ambulatory Visit: Payer: Self-pay

## 2018-09-07 ENCOUNTER — Emergency Department
Admission: EM | Admit: 2018-09-07 | Discharge: 2018-09-07 | Disposition: A | Discharge status: Home / Self Care | Payer: PRIVATE HEALTH INSURANCE | Attending: Emergency Medicine | Admitting: Emergency Medicine

## 2018-09-07 DIAGNOSIS — J019 Acute sinusitis, unspecified: Secondary | ICD-10-CM | POA: Insufficient documentation

## 2018-09-07 DIAGNOSIS — B9689 Other specified bacterial agents as the cause of diseases classified elsewhere: Secondary | ICD-10-CM | POA: Insufficient documentation

## 2018-09-07 DIAGNOSIS — E785 Hyperlipidemia, unspecified: Secondary | ICD-10-CM | POA: Insufficient documentation

## 2018-09-07 DIAGNOSIS — J45909 Unspecified asthma, uncomplicated: Secondary | ICD-10-CM | POA: Insufficient documentation

## 2018-09-07 DIAGNOSIS — F17210 Nicotine dependence, cigarettes, uncomplicated: Secondary | ICD-10-CM | POA: Insufficient documentation

## 2018-09-07 DIAGNOSIS — Z79899 Other long term (current) drug therapy: Secondary | ICD-10-CM | POA: Insufficient documentation

## 2018-09-07 LAB — INFLUENZA PANEL BY PCR (TYPE A & B)
Influenza A By PCR: NEGATIVE
Influenza B By PCR: NEGATIVE

## 2018-09-07 MED ORDER — FLUTICASONE PROPIONATE 50 MCG/ACT NA SUSP: 1.0000 | Freq: Two times a day (BID) | NASAL | 0 refills | Status: DC

## 2018-09-07 MED ORDER — AMOXICILLIN-POT CLAVULANATE 875-125 MG PO TABS: 1.0000 | ORAL_TABLET | Freq: Two times a day (BID) | ORAL | 0 refills | Status: DC

## 2018-09-07 NOTE — ED Triage Notes (Signed)
Pt to ED via POV c/o cough and congestion and sore throat. Pt denies fever. Pt is in NAD.

## 2018-09-07 NOTE — ED Provider Notes (Signed)
[facility] Emergency Department Provider Note  ____________________________________________  Time seen: Approximately 1:47 PM  I have reviewed the triage vital signs and the nursing notes.   HISTORY  Chief Complaint Cough    HPI [name] is a 33 y.o. female who presents emergency department complaining of sinus congestion, sinus pressure, left ear pain, cough.  Patient denies any fevers or chills, sore throat, chest pain, domino pain, nausea or vomiting.  Symptoms have been ongoing for the past 2 to 3 days.  She has been taking Tylenol cold medication.  No other medications prior to arrival.  She denies any complaints with chronic medical issues at this time.         Past Medical History:  Diagnosis Date  . Allergy   . Asthma   . Bipolar 1 disorder (HCC)   . Bipolar disorder (HCC)   . Ectopic pregnancy   . GERD (gastroesophageal reflux disease)   . History of concussion 2016  . Hyperlipidemia   . Migraine   . MRSA (methicillin resistant Staphylococcus aureus)   . Pseudoseizures   . Scoliosis   . Scoliosis     Patient Active Problem List   Diagnosis Date Noted  . Need for diphtheria-tetanus-pertussis (Tdap) vaccine, adult/adolescent 04/05/2015  . Abnormal weight gain 03/12/2015  . Encounter for medication monitoring 03/12/2015  . Abdominal pain, acute, right lower quadrant 03/02/2015  . Bipolar affective disorder (HCC) 01/26/2015  . Convulsion (HCC) 01/25/2015  . Seizures (HCC) 01/12/2015  . Asthma   . Hyperlipidemia   . Bipolar disorder (HCC)   . Allergy   . MRSA (methicillin resistant Staphylococcus aureus)   . Migraine headache with aura     Past Surgical History:  Procedure Laterality Date  . CARPAL TUNNEL RELEASE  2015   left  . HERNIA REPAIR  2010  . TUBAL LIGATION      Prior to Admission medications   Medication Sig Start Date End Date Taking? Authorizing Provider  albuterol (PROVENTIL HFA;VENTOLIN HFA) 108 (90  Base) MCG/ACT inhaler Inhale 2 puffs into the lungs every 6 (six) hours as needed for wheezing or shortness of breath. 03/15/18   Baity, Salvadore [place], NP  amoxicillin (AMOXIL) 500 MG capsule Take 1 capsule (500 mg total) by mouth 3 (three) times daily. 07/07/18   Enid Derry, PA-C  amoxicillin-clavulanate (AUGMENTIN) 875-125 MG tablet Take 1 tablet by mouth 2 (two) times daily. 09/07/18   Cathern Tahir, Delorise Royals, PA-C  divalproex (DEPAKOTE) 500 MG DR tablet Take 500 mg by mouth 2 (two) times daily.     [provider]  FLUoxetine (PROZAC) 20 MG tablet Take 20 mg by mouth daily.    [provider]  fluticasone (FLONASE) 50 MCG/ACT nasal spray Place 1 spray into both nostrils 2 (two) times daily. 09/07/18   Nile Dorning, Delorise Royals, PA-C  HYDROXYZINE PAMOATE PO Take by mouth 3 (three) times daily as needed.     [provider]  meloxicam (MOBIC) 15 MG tablet Take 1 tablet (15 mg total) by mouth daily. 08/08/18   Faryn Sieg, Delorise Royals, PA-C  Multiple Vitamin (MULTIVITAMIN) tablet Take 1 tablet by mouth daily.    [provider]  promethazine-dextromethorphan (PROMETHAZINE-DM) 6.25-15 MG/5ML syrup Take 5 mLs by mouth 4 (four) times daily as needed for cough. 03/15/18   Lorre Munroe, NP  vitamin B-12 (CYANOCOBALAMIN) 1000 MCG tablet Take 1,000 mcg by mouth daily.    [provider]  vitamin C (ASCORBIC ACID) 500 MG tablet Take  500 mg by mouth daily.    [provider]    Allergies Robitussin (alcohol free) [guaifenesin]; Aspirin; Tramadol; and Codeine  Family History  Problem Relation Age of Onset  . Asthma Mother   . Heart disease Mother   . Diabetes Mother   . Hyperlipidemia Mother   . Hypertension Mother   . Mental illness Mother   . Migraines Mother   . Thyroid disease Mother   . Stroke Mother   . Hypertension Sister   . Cancer Maternal Grandmother   . Diabetes Maternal Grandmother   . Heart disease Maternal Grandmother   . Hyperlipidemia  Maternal Grandmother   . Hypertension Maternal Grandmother   . Kidney disease Maternal Grandmother   . Heart disease Paternal Grandfather     Social History Social History   Tobacco Use  . Smoking status: Current Every Day Smoker    Packs/day: 1.00    Types: Cigarettes  . Smokeless tobacco: Never Used  Substance Use Topics  . Alcohol use: No    Alcohol/week: 0.0 standard drinks  . Drug use: No     Review of Systems  Constitutional: No fever/chills Eyes: No visual changes. No discharge ENT: Positive for nasal congestion, sinus pressure, left ear pain Cardiovascular: no chest pain. Respiratory: Positive cough. No SOB. Gastrointestinal: No abdominal pain.  No nausea, no vomiting.  No diarrhea.  No constipation. Musculoskeletal: Negative for musculoskeletal pain. Skin: Negative for rash, abrasions, lacerations, ecchymosis. Neurological: Negative for headaches, focal weakness or numbness. 10-point ROS otherwise negative.  ____________________________________________   PHYSICAL EXAM:  VITAL SIGNS: ED Triage Vitals  Enc Vitals Group     BP 09/07/18 1201 116/79     Pulse Rate 09/07/18 1201 86     Resp 09/07/18 1201 16     Temp 09/07/18 1201 98.2 F (36.8 C)     Temp Source 09/07/18 1201 Oral     SpO2 09/07/18 1201 100 %     Weight 09/07/18 1201 150 lb (68 kg)     Height 09/07/18 1201  (1.651 m)     Head Circumference --      Peak Flow --      Pain Score 09/07/18 1203 7     Pain Loc --      Pain Edu? --      Excl. in GC? --      Constitutional: Alert and oriented. Well appearing and in no acute distress. Eyes: Conjunctivae are normal. PERRL. EOMI. Head: Atraumatic. ENT:      Ears: EACs unremarkable bilaterally.  TM on left is moderately bulging.  No injection.      Nose: Significant purulent congestion/rhinnorhea.  Patient is very tender to percussion over bilateral maxillary sinuses.      Mouth/Throat: Mucous membranes are moist.  Oropharynx is  nonerythematous and nonedematous.  Uvula is midline. Neck: No stridor.  Neck is supple full range motion Hematological/Lymphatic/Immunilogical: Scattered, mobile, nontender anterior cervical lymphadenopathy. Cardiovascular: Normal rate, regular rhythm. Normal S1 and S2.  Good peripheral circulation. Respiratory: Normal respiratory effort without tachypnea or retractions. Lungs CTAB. Good air entry to the bases with no decreased or absent breath sounds. Musculoskeletal: Full range of motion to all extremities. No gross deformities appreciated. Neurologic:  Normal speech and language. No gross focal neurologic deficits are appreciated.  Skin:  Skin is warm, dry and intact. No rash noted. Psychiatric: Mood and affect are normal. Speech and behavior are normal. Patient exhibits appropriate insight and judgement.   ____________________________________________  LABS (all labs ordered are listed, but only abnormal results are displayed)  Labs Reviewed  INFLUENZA PANEL BY PCR (TYPE A & B)   ____________________________________________  EKG   ____________________________________________  RADIOLOGY   No results found.  ____________________________________________    PROCEDURES  Procedure(s) performed:    Procedures    Medications - No data to display   ____________________________________________   INITIAL IMPRESSION / ASSESSMENT AND PLAN / ED COURSE  Pertinent labs & imaging results that were available during my care of the patient were reviewed by me and considered in my medical decision making (see chart for details).  Review of the [place] CSRS was performed in accordance of the [facility] prior to dispensing any controlled drugs.           Patient's diagnosis is consistent with sinusitis.  Patient presented to the emergency department with nasal congestion, sinus pressure, left ear pain and cough.  Overall, findings are consistent with bacterial sinusitis.  No indication  of influenza with testing.  On exam, lungs with no adventitious lung sounds. Patient will be discharged home with prescriptions for Augmentin, Flonase.  She is to continue over-the-counter symptom control medications at home.  Follow-up with primary care as needed. Patient is given ED precautions to return to the ED for any worsening or new symptoms.     ____________________________________________  FINAL CLINICAL IMPRESSION(S) / ED DIAGNOSES  Final diagnoses:  Acute bacterial sinusitis      NEW MEDICATIONS STARTED DURING THIS VISIT:  ED Discharge Orders         Ordered    amoxicillin-clavulanate (AUGMENTIN) 875-125 MG tablet  2 times daily     09/07/18 1400    fluticasone (FLONASE) 50 MCG/ACT nasal spray  2 times daily     09/07/18 1400              This chart was dictated using voice recognition software/Dragon. Despite best efforts to proofread, errors can occur which can change the meaning. Any change was purely unintentional.    Racheal Patches, PA-C 09/07/18 1400    Sharman Cheek, MD 09/08/18 [phone]

## 2018-10-09 ENCOUNTER — Ambulatory Visit (INDEPENDENT_AMBULATORY_CARE_PROVIDER_SITE_OTHER): Payer: Managed Care, Other (non HMO) | Admitting: Nurse Practitioner

## 2018-10-09 ENCOUNTER — Other Ambulatory Visit: Payer: Self-pay

## 2018-10-09 ENCOUNTER — Encounter: Payer: Self-pay | Admitting: Nurse Practitioner

## 2018-10-09 VITALS — HR 88 | Temp 98.7°F | Resp 16

## 2018-10-09 DIAGNOSIS — J302 Other seasonal allergic rhinitis: Secondary | ICD-10-CM

## 2018-10-09 DIAGNOSIS — R059 Cough, unspecified: Secondary | ICD-10-CM

## 2018-10-09 DIAGNOSIS — R05 Cough: Secondary | ICD-10-CM

## 2018-10-09 MED ORDER — BENZONATATE 100 MG PO CAPS: 200.0000 mg | ORAL_CAPSULE | Freq: Three times a day (TID) | ORAL | 0 refills | Status: DC | PRN

## 2018-10-09 NOTE — Progress Notes (Signed)
Virtual Visit via Video Note  I connected with [name] on 10/09/18 at 11:00 AM EDT by a video enabled telemedicine application and verified that I am speaking with the correct person using two identifiers.   Staff discussed the limitations of evaluation and management by telemedicine and the availability of in person appointments. The patient expressed understanding and agreed to proceed.  Patient location: home  My location: home office Other people present: none, daughter in next room.   HPI  [facility] and got treated for sinus infection and was feeling back to baseline. Monday started to get dry, weak intermittent cough. States her cough has gotten worse- states she feels a lot of mucous in her throat. States she is having coughing fits that get our out of breath.  Mild rhinorrhea and itchy eyes.  Denies fevers, chills, sinus pain  Takes alkaseltzer sinus and cold with some relief  PHQ2/9: Depression screen PHQ 2/9 10/09/2018  Decreased Interest 0  Down, Depressed, Hopeless 2  PHQ - 2 Score 2  Altered sleeping 2  Tired, decreased energy 2  Change in appetite 2  Feeling bad or failure about yourself  3  Trouble concentrating 3  Moving slowly or fidgety/restless 0  Suicidal thoughts 0  PHQ-9 Score 14  Difficult doing work/chores Very difficult    PHQ reviewed. Negative  Patient Active Problem List   Diagnosis Date Noted  . Need for diphtheria-tetanus-pertussis (Tdap) vaccine, adult/adolescent 04/05/2015  . Abnormal weight gain 03/12/2015  . Encounter for medication monitoring 03/12/2015  . Abdominal pain, acute, right lower quadrant 03/02/2015  . Bipolar affective disorder (HCC) 01/26/2015  . Convulsion (HCC) 01/25/2015  . Seizures (HCC) 01/12/2015  . Asthma   . Hyperlipidemia   . Bipolar disorder (HCC)   . Allergy   . MRSA (methicillin resistant Staphylococcus aureus)   . Migraine headache with aura     Past Medical History:   Diagnosis Date  . Allergy   . Asthma   . Bipolar 1 disorder (HCC)   . Bipolar disorder (HCC)   . Ectopic pregnancy   . GERD (gastroesophageal reflux disease)   . History of concussion 2016  . Hyperlipidemia   . Migraine   . MRSA (methicillin resistant Staphylococcus aureus)   . Pseudoseizures   . Scoliosis   . Scoliosis     Past Surgical History:  Procedure Laterality Date  . CARPAL TUNNEL RELEASE  2015   left  . HERNIA REPAIR  2010  . TUBAL LIGATION      Social History   Tobacco Use  . Smoking status: Current Every Day Smoker    Packs/day: 1.00    Types: Cigarettes  . Smokeless tobacco: Never Used  Substance Use Topics  . Alcohol use: No    Alcohol/week: 0.0 standard drinks     Current Outpatient Medications:  .  albuterol (PROVENTIL HFA;VENTOLIN HFA) 108 (90 Base) MCG/ACT inhaler, Inhale 2 puffs into the lungs every 6 (six) hours as needed for wheezing or shortness of breath., Disp: 1 Inhaler, Rfl: 0 .  fluticasone (FLONASE) 50 MCG/ACT nasal spray, Place 1 spray into both nostrils 2 (two) times daily., Disp: 16 g, Rfl: 0 .  Multiple Vitamin (MULTIVITAMIN) tablet, Take 1 tablet by mouth daily., Disp: , Rfl:  .  vitamin B-12 (CYANOCOBALAMIN) 1000 MCG tablet, Take 1,000 mcg by mouth daily., Disp: , Rfl:  .  vitamin C (ASCORBIC ACID) 500 MG tablet, Take 500 mg by mouth daily., Disp: ,  Rfl:  .  benzonatate (TESSALON PERLES) 100 MG capsule, Take 2 capsules (200 mg total) by mouth 3 (three) times daily as needed for cough., Disp: 30 capsule, Rfl: 0 .  divalproex (DEPAKOTE) 500 MG DR tablet, Take 500 mg by mouth 2 (two) times daily. , Disp: , Rfl:  .  FLUoxetine (PROZAC) 20 MG tablet, Take 20 mg by mouth daily., Disp: , Rfl:  .  HYDROXYZINE PAMOATE PO, Take by mouth 3 (three) times daily as needed. , Disp: , Rfl:   Allergies  Allergen Reactions  . Robitussin (Alcohol Free) [Guaifenesin] Anaphylaxis  . Aspirin Other (See Comments)    Nose bleeds  . Tramadol     Due to  Seizures per patient  . Chocolate Flavor Rash  . Codeine Rash  . Strawberry Extract Rash    ROS   No other specific complaints in a complete review of systems (except as listed in HPI above).  Objective  Vitals:   10/09/18 1114  Pulse: 88  Resp: 16  Temp: 98.7 F (37.1 C)  TempSrc: Oral    There is no height or weight on file to calculate BMI.  Nursing Note and Vital Signs reviewed.  Physical Exam  Constitutional: Patient appears well-developed and well-nourished. No distress.  HENT: Head: Normocephalic and atraumatic. No sinus tenderness, oropharynx WNL. Cardiovascular: Normal rate Pulmonary/Chest: Effort normal  Musculoskeletal: Normal range of motion,  Neurological: he is alert and oriented to person, place, and time. speech and gait are normal.  Skin: No rash noted. No erythema.  Psychiatric: Patient has a normal mood and affect. behavior is normal. Judgment and thought content normal.    Assessment & Plan  1. Cough - benzonatate (TESSALON PERLES) 100 MG capsule; Take 2 capsules (200 mg total) by mouth 3 (three) times daily as needed for cough.  Dispense: 30 capsule; Refill: 0  2. Seasonal allergies continue OTC managment    Follow Up Instructions: If develops fever, shortness of breath or if not improving within 1-2 weeks.    I discussed the assessment and treatment plan with the patient. The patient was provided an opportunity to ask questions and all were answered. The patient agreed with the plan and demonstrated an understanding of the instructions.   The patient was advised to call back or seek an in-person evaluation if the symptoms worsen or if the condition fails to improve as anticipated.  I provided 16 minutes of non-face-to-face time during this encounter.   Cheryle Horsfall, NP

## 2018-11-14 ENCOUNTER — Other Ambulatory Visit: Payer: Self-pay

## 2018-11-14 ENCOUNTER — Encounter: Payer: Self-pay | Admitting: Family Medicine

## 2018-11-14 ENCOUNTER — Ambulatory Visit (INDEPENDENT_AMBULATORY_CARE_PROVIDER_SITE_OTHER): Payer: Self-pay | Admitting: Nurse Practitioner

## 2018-11-14 ENCOUNTER — Encounter: Payer: Self-pay | Admitting: Nurse Practitioner

## 2018-11-14 VITALS — Temp 97.5°F | Resp 16 | Ht 66.0 in | Wt 137.0 lb

## 2018-11-14 DIAGNOSIS — F3175 Bipolar disorder, in partial remission, most recent episode depressed: Secondary | ICD-10-CM

## 2018-11-14 DIAGNOSIS — B349 Viral infection, unspecified: Secondary | ICD-10-CM

## 2018-11-14 DIAGNOSIS — R110 Nausea: Secondary | ICD-10-CM

## 2018-11-14 DIAGNOSIS — R05 Cough: Secondary | ICD-10-CM

## 2018-11-14 DIAGNOSIS — R059 Cough, unspecified: Secondary | ICD-10-CM

## 2018-11-14 MED ORDER — ONDANSETRON 4 MG PO TBDP: 4.0000 mg | ORAL_TABLET | Freq: Three times a day (TID) | ORAL | 0 refills | Status: DC | PRN

## 2018-11-14 NOTE — Progress Notes (Signed)
Virtual Visit via Video Note  I connected with [name] on 11/14/18 at  8:40 AM EDT by a video enabled telemedicine application and verified that I am speaking with the correct person using two identifiers.   Staff discussed the limitations of evaluation and management by telemedicine and the availability of in person appointments. The patient expressed understanding and agreed to proceed.  Patient location: home  My location: home office Other people present: none HPI  Patient states had been feeling poorly all week. States would go into work feeling nauseated and by lunch time it would resolve. States yesterday felt nauseated all day, when she eats would feel she was going to throw up. States no issues with drinking. Patient endorses fatigue the past few days, she doesn't even want to get out of bed. States yesterday had 2 episodes of lightheadedness in the morning and once in the afternoon lasted for a few seconds when she changed positions quickly. States had some very soft stools on Monday and Wednesday but has had normal stools since then. Endorses frontal headache that has been relatively constant since Wednesday. Patient endorses lower abdominal pain that starts to cramp when she eats and then resolves after she finishes. Patient states she has dry cough- feels like its her allergies. She feels short of breath when wearing facemask and gets hot- resolves with using inhaler. Endorses hot flashes for the past 2 weeks- states checked her temperature and it was normal.    Last period was may 5th. Had tubal ligation.   States has not been on prozac and depakote in over a year since losing insurance- was seeing Dr. Elesa Massed at [facility].  PHQ2/9: Depression screen [facility] 2/9 11/14/2018 10/09/2018  Decreased Interest 1 0  Down, Depressed, Hopeless 2 2  PHQ - 2 Score 3 2  Altered sleeping 2 2  Tired, decreased energy 3 2  Change in appetite 1 2  Feeling bad or failure about yourself   2 3  Trouble concentrating 2 3  Moving slowly or fidgety/restless 1 0  Suicidal thoughts 0 0  PHQ-9 Score 14 14  Difficult doing work/chores Very difficult Very difficult     PHQ reviewed. Positive- states she will be getting insurance soon with new job. Discussed starting back on meds, will hold of now and discuss at follow-up.   Patient Active Problem List   Diagnosis Date Noted  . Need for diphtheria-tetanus-pertussis (Tdap) vaccine, adult/adolescent 04/05/2015  . Abnormal weight gain 03/12/2015  . Encounter for medication monitoring 03/12/2015  . Abdominal pain, acute, right lower quadrant 03/02/2015  . Bipolar affective disorder (HCC) 01/26/2015  . Convulsion (HCC) 01/25/2015  . Seizures (HCC) 01/12/2015  . Asthma   . Hyperlipidemia   . Bipolar disorder (HCC)   . Allergy   . MRSA (methicillin resistant Staphylococcus aureus)   . Migraine headache with aura     Past Medical History:  Diagnosis Date  . Allergy   . Asthma   . Bipolar 1 disorder (HCC)   . Bipolar disorder (HCC)   . Ectopic pregnancy   . GERD (gastroesophageal reflux disease)   . History of concussion 2016  . Hyperlipidemia   . Migraine   . MRSA (methicillin resistant Staphylococcus aureus)   . Pseudoseizures   . Scoliosis   . Scoliosis     Past Surgical History:  Procedure Laterality Date  . CARPAL TUNNEL RELEASE  2015   left  . HERNIA REPAIR  2010  . TUBAL LIGATION  Social History   Tobacco Use  . Smoking status: Current Every Day Smoker    Packs/day: 1.00    Types: Cigarettes  . Smokeless tobacco: Never Used  Substance Use Topics  . Alcohol use: No    Alcohol/week: 0.0 standard drinks     Current Outpatient Medications:  .  albuterol (PROVENTIL HFA;VENTOLIN HFA) 108 (90 Base) MCG/ACT inhaler, Inhale 2 puffs into the lungs every 6 (six) hours as needed for wheezing or shortness of breath., Disp: 1 Inhaler, Rfl: 0 .  fluticasone (FLONASE) 50 MCG/ACT nasal spray, Place 1 spray  into both nostrils 2 (two) times daily., Disp: 16 g, Rfl: 0 .  Multiple Vitamin (MULTIVITAMIN) tablet, Take 1 tablet by mouth daily., Disp: , Rfl:  .  vitamin B-12 (CYANOCOBALAMIN) 1000 MCG tablet, Take 1,000 mcg by mouth daily., Disp: , Rfl:  .  vitamin C (ASCORBIC ACID) 500 MG tablet, Take 500 mg by mouth daily., Disp: , Rfl:  .  divalproex (DEPAKOTE) 500 MG DR tablet, Take 500 mg by mouth 2 (two) times daily. , Disp: , Rfl:  .  FLUoxetine (PROZAC) 20 MG tablet, Take 20 mg by mouth daily., Disp: , Rfl:  .  HYDROXYZINE PAMOATE PO, Take by mouth 3 (three) times daily as needed. , Disp: , Rfl:  .  ondansetron (ZOFRAN-ODT) 4 MG disintegrating tablet, Take 1 tablet (4 mg total) by mouth every 8 (eight) hours as needed for nausea or vomiting., Disp: 20 tablet, Rfl: 0  Allergies  Allergen Reactions  . Robitussin (Alcohol Free) [Guaifenesin] Anaphylaxis  . Aspirin Other (See Comments)    Nose bleeds  . Tramadol     Due to Seizures per patient  . Chocolate Flavor Rash  . Codeine Rash  . Strawberry Extract Rash    ROS   No other specific complaints in a complete review of systems (except as listed in HPI above).  Objective  Vitals:   11/14/18 0812  Resp: 16  Temp: (!) 97.5 F (36.4 C)  TempSrc: Oral  Weight: 137 lb (62.1 kg)  Height:  (1.676 m)    Body mass index is 22.11 kg/m.  Nursing Note and Vital Signs reviewed.  Physical Exam  Constitutional: Patient appears well-developed and well-nourished. No distress.  Dry cough during assessment HENT: Head: Normocephalic and atraumatic. Pulmonary/Chest: Effort normal  Musculoskeletal: Normal range of motion,  Abdomen: nontender to palpation, notes bilateral lower abdominal cramping at times.  Neurological: alert and oriented, speech normal.  Skin: No rash noted. No erythema.  Psychiatric: Patient has a normal mood and affect. behavior is normal. Judgment and thought content normal.    Assessment & Plan  1. Nausea -  ondansetron (ZOFRAN-ODT) 4 MG disintegrating tablet; Take 1 tablet (4 mg total) by mouth every 8 (eight) hours as needed for nausea or vomiting.  Dispense: 20 tablet; Refill: 0  2. Nonspecific syndrome suggestive of viral illness Discussed management with fluids, nutrtion, rest, OTC meds, and nausea meds. Discussed red flag s&s. Given number for [facility] triage to consider COVID testing as she does not meet qualifications for [facility] testing at this time.  3. Cough Mild believes its allergic.   4. Bipolar disorder, in partial remission, most recent episode depressed (HCC) Mild depression, states has been off meds in a year, considered restarting prozac however concern for manic episode- additionally patient states unable to pay until she gets insurance which should be in a few weeks.    Follow Up Instructions:  Follow up on Tuesday.  I discussed the assessment and treatment plan with the patient. The patient was provided an opportunity to ask questions and all were answered. The patient agreed with the plan and demonstrated an understanding of the instructions.   The patient was advised to call back or seek an in-person evaluation if the symptoms worsen or if the condition fails to improve as anticipated.  I provided 23 minutes of non-face-to-face time during this encounter.   Cheryle HorsfallElizabeth E Carlee Vonderhaar, NP

## 2018-11-14 NOTE — Patient Instructions (Signed)
I suspect you may have COVID-19/coronavirus and recommend that you are tested to confirm.  Please call [facility]'s COVID-19 hotline at [phone] to determine if you are eligible for testing.  

## 2018-11-17 ENCOUNTER — Encounter: Payer: Self-pay | Admitting: Family Medicine

## 2018-11-18 ENCOUNTER — Other Ambulatory Visit: Payer: Self-pay

## 2018-11-18 ENCOUNTER — Encounter: Payer: Self-pay | Admitting: Family Medicine

## 2018-11-18 ENCOUNTER — Ambulatory Visit: Payer: Self-pay | Admitting: Family Medicine

## 2018-11-18 DIAGNOSIS — F3175 Bipolar disorder, in partial remission, most recent episode depressed: Secondary | ICD-10-CM

## 2018-11-18 DIAGNOSIS — R110 Nausea: Secondary | ICD-10-CM

## 2018-11-18 DIAGNOSIS — N3001 Acute cystitis with hematuria: Secondary | ICD-10-CM

## 2018-11-18 LAB — POCT URINALYSIS DIPSTICK
Bilirubin, UA: NEGATIVE
Glucose, UA: NEGATIVE
Ketones, UA: NEGATIVE
Nitrite, UA: NEGATIVE
Protein, UA: POSITIVE — AB
Spec Grav, UA: 1.015 (ref 1.010–1.025)
Urobilinogen, UA: 0.2 E.U./dL
pH, UA: 5 (ref 5.0–8.0)

## 2018-11-18 MED ORDER — DIVALPROEX SODIUM 250 MG PO DR TAB: DELAYED_RELEASE_TABLET | ORAL | 0 refills | Status: DC

## 2018-11-18 MED ORDER — FLUOXETINE HCL 10 MG PO CAPS: ORAL_CAPSULE | ORAL | 1 refills | Status: DC

## 2018-11-18 MED ORDER — SULFAMETHOXAZOLE-TRIMETHOPRIM 800-160 MG PO TABS: 1.0000 | ORAL_TABLET | Freq: Two times a day (BID) | ORAL | 0 refills | Status: AC

## 2018-11-18 NOTE — Progress Notes (Signed)
Name: [name]   MRN: [id number]    DOB: 07/02/1985   Date:11/18/2018       Progress Note  Subjective  Chief Complaint  Chief Complaint  Patient presents with   Follow-up    nausea, light headed virtual visit in Friday Covid19 test negative   Depression    PHQ elevated    I connected with  Niel Hummer  on 11/18/18 at 10:20 AM EDT by a video enabled telemedicine application and verified that I am speaking with the correct person using two identifiers.  I discussed the limitations of evaluation and management by telemedicine and the availability of in person appointments. The patient expressed understanding and agreed to proceed. Staff also discussed with the patient that there may be a patient responsible charge related to this service. Patient Location: Home Provider Location: Office Additional Individuals present: None  HPI  Patient states had been feeling poorly for about 10 days.  She is smoking a cigarette throughout the entirety of the conversation.  Had COVID-19 testing which was negative.  She has not had a fever. She is taking Zofran to calm her nausea, is unable to eat much without Zofran on board.  Still having soft stools but no diarrhea.  She denies abdominal pain but does have some cramping that is intermittent and lower central abdomen.  No dysuria.  She does endorse urinary frequency.  States no issues with drinking fluids. Patient endorses fatigue the past few days, she doesn't even want to get out of bed. States yesterday had 1 episode of lightheadedness yesterday that lasted for a few seconds.  Endorses ongoing frontal headache that has been relatively constant since Wednesday. Patient endorses lower abdominal pain that starts to cramp when she eats and then resolves after she finishes. Patient states she has dry cough- feels like its her allergies. She feels short of breath when wearing facemask and gets hot- resolves with using inhaler.  Last period was may  5th. Had tubal ligation.   States has not been on prozac and depakote in over a year since losing insurance- was seeing Dr. Elesa Massed at [facility].  She did look up the prices of these medications through [facility], and they will be affordable for her. We will restart these medication as she notes the more stressed she is, the sicker she becomes with her nausea as described above.  PHQ reviewed. Positive- states she will be getting insurance soon with new job. Discussed starting back on meds, will hold of now and discuss at follow-up.   Patient Active Problem List   Diagnosis Date Noted   Need for diphtheria-tetanus-pertussis (Tdap) vaccine, adult/adolescent 04/05/2015   Abnormal weight gain 03/12/2015   Encounter for medication monitoring 03/12/2015   Abdominal pain, acute, right lower quadrant 03/02/2015   Bipolar affective disorder (HCC) 01/26/2015   Convulsion (HCC) 01/25/2015   Seizures (HCC) 01/12/2015   Asthma    Hyperlipidemia    Bipolar disorder (HCC)    Allergy    MRSA (methicillin resistant Staphylococcus aureus)    Migraine headache with aura     Past Surgical History:  Procedure Laterality Date   CARPAL TUNNEL RELEASE  2015   left   HERNIA REPAIR  2010   TUBAL LIGATION      Family History  Problem Relation Age of Onset   Asthma Mother    Heart disease Mother    Diabetes Mother    Hyperlipidemia Mother    Hypertension Mother  Mental illness Mother    Migraines Mother    Thyroid disease Mother    Stroke Mother    Hypertension Sister    Cancer Maternal Grandmother    Diabetes Maternal Grandmother    Heart disease Maternal Grandmother    Hyperlipidemia Maternal Grandmother    Hypertension Maternal Grandmother    Kidney disease Maternal Grandmother    Heart disease Paternal Grandfather     Social History   Socioeconomic History   Marital status: Legally Separated    Spouse name: Not on file   Number of  children: 2   Years of education: Not on file   Highest education level: GED or equivalent  Occupational History   Not on file  Social Needs   Financial resource strain: Not hard at all   Food insecurity:    Worry: Never true    Inability: Never true   Transportation needs:    Medical: No    Non-medical: No  Tobacco Use   Smoking status: Current Every Day Smoker    Packs/day: 1.00    Types: Cigarettes   Smokeless tobacco: Never Used  Substance and Sexual Activity   Alcohol use: No    Alcohol/week: 0.0 standard drinks   Drug use: No   Sexual activity: Yes    Partners: Male    Birth control/protection: None, Surgical  Lifestyle   Physical activity:    Days per week: 0 days    Minutes per session: 0 min   Stress: Not at all  Relationships   Social connections:    Talks on phone: More than three times a week    Gets together: Twice a week    Attends religious service: More than 4 times per year    Active member of club or organization: No    Attends meetings of clubs or organizations: Never    Relationship status: Separated   Intimate partner violence:    Fear of current or ex partner: No    Emotionally abused: No    Physically abused: No    Forced sexual activity: No  Other Topics Concern   Not on file  Social History Narrative   Not on file     Current Outpatient Medications:    albuterol (PROVENTIL HFA;VENTOLIN HFA) 108 (90 Base) MCG/ACT inhaler, Inhale 2 puffs into the lungs every 6 (six) hours as needed for wheezing or shortness of breath., Disp: 1 Inhaler, Rfl: 0   fluticasone (FLONASE) 50 MCG/ACT nasal spray, Place 1 spray into both nostrils 2 (two) times daily., Disp: 16 g, Rfl: 0   HYDROXYZINE PAMOATE PO, Take by mouth 3 (three) times daily as needed. , Disp: , Rfl:    Multiple Vitamin (MULTIVITAMIN) tablet, Take 1 tablet by mouth daily., Disp: , Rfl:    ondansetron (ZOFRAN-ODT) 4 MG disintegrating tablet, Take 1 tablet (4 mg total) by  mouth every 8 (eight) hours as needed for nausea or vomiting., Disp: 20 tablet, Rfl: 0   vitamin B-12 (CYANOCOBALAMIN) 1000 MCG tablet, Take 1,000 mcg by mouth daily., Disp: , Rfl:    vitamin C (ASCORBIC ACID) 500 MG tablet, Take 500 mg by mouth daily., Disp: , Rfl:    divalproex (DEPAKOTE) 250 MG DR tablet, Taken 1 tablet once daily for 7 days, then increase to 1 tablet twice daily., Disp: 60 tablet, Rfl: 0   FLUoxetine (PROZAC) 10 MG capsule, Take 1 tablet once daily for 2 weeks, then increase to 2 tablets once daily, Disp: 60 capsule, Rfl: 1  sulfamethoxazole-trimethoprim (BACTRIM DS) 800-160 MG tablet, Take 1 tablet by mouth 2 (two) times daily for 5 days., Disp: 10 tablet, Rfl: 0  Allergies  Allergen Reactions   Robitussin (Alcohol Free) [Guaifenesin] Anaphylaxis   Aspirin Other (See Comments)    Nose bleeds   Tramadol     Due to Seizures per patient   Chocolate Flavor Rash   Codeine Rash   Strawberry Extract Rash    I personally reviewed active problem list, medication list, allergies, notes from last encounter, lab results with the patient/caregiver today.   ROS Constitutional: Negative for fever or weight change.  Respiratory: Negative for cough and shortness of breath.   Cardiovascular: Negative for chest pain or palpitations.  Gastrointestinal: Negative for abdominal pain, no bowel changes.  Musculoskeletal: Negative for gait problem or joint swelling.  Skin: Negative for rash.  Neurological: Negative for dizziness or headache.  No other specific complaints in a complete review of systems (except as listed in HPI above).  Objective  Virtual encounter, vitals not obtained.  There is no height or weight on file to calculate BMI.  Physical Exam Constitutional: Patient appears well-developed and well-nourished. No distress. Smoking cigarettes throughout visit. HENT: Head: Normocephalic and atraumatic.  Neck: Normal range of motion. Pulmonary/Chest: Effort  normal. No respiratory distress. Speaking in complete sentences Neurological: Pt is alert and oriented to person, place, and time. Coordination, speech and gait are normal.  Psychiatric: Patient has a normal mood and affect. behavior is normal. Judgment and thought content normal.  Results for orders placed or performed in visit on 11/18/18 (from the past 72 hour(s))  POCT urinalysis dipstick     Status: Abnormal   Collection Time: 11/18/18 11:12 AM  Result Value Ref Range   Color, UA yellow    Clarity, UA clear    Glucose, UA Negative Negative   Bilirubin, UA negative    Ketones, UA negative    Spec Grav, UA 1.015 1.010 - 1.025   Blood, UA trace    pH, UA 5.0 5.0 - 8.0   Protein, UA Positive (A) Negative   Urobilinogen, UA 0.2 0.2 or 1.0 E.U./dL   Nitrite, UA negative    Leukocytes, UA Large (3+) (A) Negative   Appearance clear    Odor none     PHQ2/9: [facility] 2[facility] 11/18/2018 11/14/2018 10/09/2018  Decreased Interest 3 1 0  Down, Depressed, Hopeless 2 2 2   PHQ - 2 Score 5 3 2   Altered sleeping 3 2 2   Tired, decreased energy 3 3 2   Change in appetite 3 1 2   Feeling bad or failure about yourself  2 2 3   Trouble concentrating 2 2 3   Moving slowly or fidgety/restless 2 1 0  Suicidal thoughts 1 0 0  PHQ-9 Score 21 14 14   Difficult doing work/chores Very difficult Very difficult Very difficult   PHQ-2/9 Result is positive.    Fall Risk: Fall Risk  11/18/2018 11/14/2018 10/09/2018  Falls in the past year? 1 1 1   Number falls in past yr: 0 0 0  Injury with Fall? 1 1 1   Follow up Falls evaluation completed - -    Assessment & Plan  1. Nausea - Suspect nausea is somewhat related to mood disorder, however if not improving after restarting her medications, she will obtain labs as per orders below.  Will come in today for UA.  Limited testing due to cost at this time - she is waiting to start a new job. -  CBC w/Diff/Platelet - Comprehensive metabolic panel - POCT  urinalysis dipstick  2. Bipolar disorder, in partial remission, most recent episode depressed (HCC) - I advised we may restart her medications, but I am only able to manage for 1-2 months, then she must return to [facility] psychiatry care.  We will restart both prozac and depakote as I do not feel prozac alone would benefit her - due to the stimulative nature of the medication I believe she also needs mood stabilizer on board.  I did advise to obtain labs as below in 3 weeks time and schedule follow up immediately after to re-evaluate. - FLUoxetine (PROZAC) 10 MG capsule; Take 1 tablet once daily for 2 weeks, then increase to 2 tablets once daily  Dispense: 60 capsule; Refill: 1 - divalproex (DEPAKOTE) 250 MG DR tablet; Taken 1 tablet once daily for 7 days, then increase to 1 tablet twice daily.  Dispense: 60 tablet; Refill: 0 - Valproic Acid level - CBC w/Diff/Platelet - Comprehensive metabolic panel  3. Acute cystitis with hematuria - Pt came in for UA - trace blood and protein, and + Leukocytes.  Will treat due to ongoing symptoms, cost prohibitive for urine culture per patient. - sulfamethoxazole-trimethoprim (BACTRIM DS) 800-160 MG tablet; Take 1 tablet by mouth 2 (two) times daily for 5 days.  Dispense: 10 tablet; Refill: 0  I discussed the assessment and treatment plan with the patient. The patient was provided an opportunity to ask questions and all were answered. The patient agreed with the plan and demonstrated an understanding of the instructions.  The patient was advised to call back or seek an in-person evaluation if the symptoms worsen or if the condition fails to improve as anticipated.  I provided 27 minutes of non-face-to-face time during this encounter.

## 2018-12-04 ENCOUNTER — Encounter: Payer: Self-pay | Admitting: Family Medicine

## 2018-12-06 LAB — COMPREHENSIVE METABOLIC PANEL
ALT: 16 IU/L (ref 0–32)
AST: 21 IU/L (ref 0–40)
Albumin/Globulin Ratio: 2.4 — ABNORMAL HIGH (ref 1.2–2.2)
Albumin: 4.6 g/dL (ref 3.8–4.8)
Alkaline Phosphatase: 45 IU/L (ref 39–117)
BUN/Creatinine Ratio: 11 (ref 9–23)
BUN: 6 mg/dL (ref 6–20)
Bilirubin Total: <0.2 mg/dL (ref 0.0–1.2)
CO2: 22 mmol/L (ref 20–29)
Calcium: 9.6 mg/dL (ref 8.7–10.2)
Chloride: 100 mmol/L (ref 96–106)
Creatinine, Ser: 0.55 mg/dL — ABNORMAL LOW (ref 0.57–1.00)
GFR calc Af Amer: 143 mL/min/{1.73_m2} (ref >59)
GFR calc non Af Amer: 124 mL/min/{1.73_m2} (ref >59)
Globulin, Total: 1.9 g/dL (ref 1.5–4.5)
Glucose: 106 mg/dL — ABNORMAL HIGH (ref 65–99)
Potassium: 3.9 mmol/L (ref 3.5–5.2)
Sodium: 136 mmol/L (ref 134–144)
Total Protein: 6.5 g/dL (ref 6.0–8.5)

## 2018-12-06 LAB — CBC WITH DIFFERENTIAL/PLATELET
Basophils Absolute: 0.1 10*3/uL (ref 0.0–0.2)
Basos: 0 %
EOS (ABSOLUTE): 0.2 10*3/uL (ref 0.0–0.4)
Eos: 1 %
Hematocrit: 37.6 % (ref 34.0–46.6)
Hemoglobin: 12.7 g/dL (ref 11.1–15.9)
Immature Grans (Abs): 0 10*3/uL (ref 0.0–0.1)
Immature Granulocytes: 0 %
Lymphocytes Absolute: 3.3 10*3/uL — ABNORMAL HIGH (ref 0.7–3.1)
Lymphs: 24 %
MCH: 30.4 pg (ref 26.6–33.0)
MCHC: 33.8 g/dL (ref 31.5–35.7)
MCV: 90 fL (ref 79–97)
Monocytes Absolute: 0.8 10*3/uL (ref 0.1–0.9)
Monocytes: 6 %
Neutrophils Absolute: 9.2 10*3/uL — ABNORMAL HIGH (ref 1.4–7.0)
Neutrophils: 69 %
Platelets: 262 10*3/uL (ref 150–450)
RBC: 4.18 x10E6/uL (ref 3.77–5.28)
RDW: 11.7 % (ref 11.7–15.4)
WBC: 13.5 10*3/uL — ABNORMAL HIGH (ref 3.4–10.8)

## 2018-12-06 LAB — VALPROIC ACID LEVEL: Valproic Acid Lvl: 33 ug/mL — ABNORMAL LOW (ref 50–100)

## 2018-12-08 ENCOUNTER — Encounter: Payer: Self-pay | Admitting: Family Medicine

## 2018-12-08 ENCOUNTER — Ambulatory Visit: Payer: Self-pay | Admitting: Family Medicine

## 2018-12-08 ENCOUNTER — Other Ambulatory Visit: Payer: Self-pay

## 2018-12-08 NOTE — Progress Notes (Signed)
Patient underwent triage by CMA, upon my call back, her telephone was disconnected.  After several hours, she did contact Bonnita Nasuti CMA, and sent a message to [place] - we will plan to repeat visit 12/09/2018 due to connectivity issues.

## 2018-12-17 ENCOUNTER — Encounter: Payer: Self-pay | Admitting: Family Medicine

## 2018-12-17 ENCOUNTER — Other Ambulatory Visit: Payer: Self-pay | Admitting: Internal Medicine

## 2018-12-17 ENCOUNTER — Telehealth: Payer: Self-pay | Admitting: Family Medicine

## 2018-12-17 ENCOUNTER — Ambulatory Visit (INDEPENDENT_AMBULATORY_CARE_PROVIDER_SITE_OTHER): Payer: Self-pay | Admitting: Family Medicine

## 2018-12-17 ENCOUNTER — Other Ambulatory Visit: Payer: Self-pay

## 2018-12-17 VITALS — Temp 98.1°F

## 2018-12-17 DIAGNOSIS — D72829 Elevated white blood cell count, unspecified: Secondary | ICD-10-CM

## 2018-12-17 DIAGNOSIS — F3175 Bipolar disorder, in partial remission, most recent episode depressed: Secondary | ICD-10-CM

## 2018-12-17 DIAGNOSIS — Z716 Tobacco abuse counseling: Secondary | ICD-10-CM

## 2018-12-17 DIAGNOSIS — J4540 Moderate persistent asthma, uncomplicated: Secondary | ICD-10-CM

## 2018-12-17 MED ORDER — FLOVENT HFA 110 MCG/ACT IN AERO: 1.0000 | INHALATION_SPRAY | Freq: Two times a day (BID) | RESPIRATORY_TRACT | 12 refills | Status: DC

## 2018-12-17 MED ORDER — BUDESONIDE 180 MCG/ACT IN AEPB: 1.0000 | INHALATION_SPRAY | Freq: Two times a day (BID) | RESPIRATORY_TRACT | 3 refills | Status: DC

## 2018-12-17 MED ORDER — FLUOXETINE HCL 10 MG PO CAPS: ORAL_CAPSULE | ORAL | 0 refills | Status: DC

## 2018-12-17 MED ORDER — DIVALPROEX SODIUM 250 MG PO DR TAB: DELAYED_RELEASE_TABLET | ORAL | 0 refills | Status: DC

## 2018-12-17 NOTE — Telephone Encounter (Signed)
Patient called to inform the doctor that the script for fluticasone (FLOVENT HFA) 110 MCG/ACT inhaler that was sent to her pharmacy cost [phone] which is much too expensive for her.  She would like to know if there is a less expensive inhaler that the doctor could prescribe.  Please advise and call patient back at [phone]

## 2018-12-17 NOTE — Telephone Encounter (Signed)
Let's try pulmicort instead - tell her to call her pharmacy prior to picking up. If still too expensive, tell her to let us know.

## 2018-12-17 NOTE — Progress Notes (Signed)
Name: [name]   MRN: [id number]    DOB: 22-Sep-1985   Date:12/17/2018       Progress Note  Subjective  Chief Complaint  Chief Complaint  Patient presents with  . Follow-up  . Medication Refill    I connected with  [name]  on 12/17/18 at 10:00 AM EDT by a video enabled telemedicine application and verified that I am speaking with the correct person using two identifiers.  I discussed the limitations of evaluation and management by telemedicine and the availability of in person appointments. The patient expressed understanding and agreed to proceed. Staff also discussed with the patient that there may be a patient responsible charge related to this service. Patient Location: Home Provider Location: Home Additional Individuals present: None  HPI   Pt presents to follow up on medication.  She feels that her mental state is much improved - no thoughts of harming herself, no SI/HI.  Still having fatigue, some depression and anxiety and some racing thoughts.  She is currently taking Prozac 20mg , Depakote 250mg  BID.  PHQ-9 score is only slightly improved.  She is willing to call [facility] today to see if they can see her in the next week or two.  Depression screen [facility] 2/9 12/17/2018 12/08/2018 11/18/2018 11/14/2018 10/09/2018  Decreased Interest 2 1 3 1  0  Down, Depressed, Hopeless 3 1 2 2 2   PHQ - 2 Score 5 2 5 3 2   Altered sleeping 3 3 3 2 2   Tired, decreased energy 3 2 3 3 2   Change in appetite 2 3 3 1 2   Feeling bad or failure about yourself  2 3 2 2 3   Trouble concentrating 2 2 2 2 3   Moving slowly or fidgety/restless 0 2 2 1  0  Suicidal thoughts 0 1 1 0 0  PHQ-9 Score 17 18 21 14 14   Difficult doing work/chores Very difficult Somewhat difficult Very difficult Very difficult Very difficult   Asthma: Is still smoking, but cutting back, she is smoking 1/3 ppd.  She is also having to wear a mask at work and feels that she is having to cough more at work and feeling short of  breath at work.  We will trial flovent as she has been using her albuterol much more frequently.  Patient Active Problem List   Diagnosis Date Noted  . Need for diphtheria-tetanus-pertussis (Tdap) vaccine, adult/adolescent 04/05/2015  . Abnormal weight gain 03/12/2015  . Encounter for medication monitoring 03/12/2015  . Abdominal pain, acute, right lower quadrant 03/02/2015  . Bipolar affective disorder ([place]) 01/26/2015  . Convulsion ([place]) 01/25/2015  . Seizures ([place]) 01/12/2015  . Asthma   . Hyperlipidemia   . Bipolar disorder ([place])   . Allergy   . MRSA (methicillin resistant Staphylococcus aureus)   . Migraine headache with aura     Past Surgical History:  Procedure Laterality Date  . CARPAL TUNNEL RELEASE  2015   left  . HERNIA REPAIR  2010  . TUBAL LIGATION      Family History  Problem Relation Age of Onset  . Asthma Mother   . Heart disease Mother   . Diabetes Mother   . Hyperlipidemia Mother   . Hypertension Mother   . Mental illness Mother   . Migraines Mother   . Thyroid disease Mother   . Stroke Mother   . Hypertension Sister   . Cancer Maternal Grandmother   . Diabetes Maternal Grandmother   . Heart disease  Maternal Grandmother   . Hyperlipidemia Maternal Grandmother   . Hypertension Maternal Grandmother   . Kidney disease Maternal Grandmother   . Heart disease Paternal Grandfather     Social History   Socioeconomic History  . Marital status: Legally Separated    Spouse name: Not on file  . Number of children: 2  . Years of education: Not on file  . Highest education level: GED or equivalent  Occupational History  . Not on file  Social Needs  . Financial resource strain: Not hard at all  . Food insecurity    Worry: Never true    Inability: Never true  . Transportation needs    Medical: No    Non-medical: No  Tobacco Use  . Smoking status: Current Every Day Smoker    Packs/day: 1.00    Types: Cigarettes  . Smokeless tobacco: Never Used   Substance and Sexual Activity  . Alcohol use: No    Alcohol/week: 0.0 standard drinks  . Drug use: No  . Sexual activity: Yes    Partners: Male    Birth control/protection: None, Surgical  Lifestyle  . Physical activity    Days per week: 0 days    Minutes per session: 0 min  . Stress: Not at all  Relationships  . Social connections    Talks on phone: More than three times a week    Gets together: Twice a week    Attends religious service: More than 4 times per year    Active member of club or organization: No    Attends meetings of clubs or organizations: Never    Relationship status: Separated  . Intimate partner violence    Fear of current or ex partner: No    Emotionally abused: No    Physically abused: No    Forced sexual activity: No  Other Topics Concern  . Not on file  Social History Narrative  . Not on file     Current Outpatient Medications:  .  albuterol (PROVENTIL HFA;VENTOLIN HFA) 108 (90 Base) MCG/ACT inhaler, Inhale 2 puffs into the lungs every 6 (six) hours as needed for wheezing or shortness of breath., Disp: 1 Inhaler, Rfl: 0 .  divalproex (DEPAKOTE) 250 MG DR tablet, Taken 1 tablet once daily for 7 days, then increase to 1 tablet twice daily., Disp: 60 tablet, Rfl: 0 .  FLUoxetine (PROZAC) 10 MG capsule, Take 1 tablet once daily for 2 weeks, then increase to 2 tablets once daily, Disp: 60 capsule, Rfl: 1 .  fluticasone (FLONASE) 50 MCG/ACT nasal spray, Place 1 spray into both nostrils 2 (two) times daily., Disp: 16 g, Rfl: 0 .  Multiple Vitamin (MULTIVITAMIN) tablet, Take 1 tablet by mouth daily., Disp: , Rfl:  .  ondansetron (ZOFRAN-ODT) 4 MG disintegrating tablet, Take 1 tablet (4 mg total) by mouth every 8 (eight) hours as needed for nausea or vomiting., Disp: 20 tablet, Rfl: 0 .  vitamin B-12 (CYANOCOBALAMIN) 1000 MCG tablet, Take 1,000 mcg by mouth daily., Disp: , Rfl:  .  vitamin C (ASCORBIC ACID) 500 MG tablet, Take 500 mg by mouth daily., Disp: ,  Rfl:  .  fluticasone (FLOVENT HFA) 110 MCG/ACT inhaler, Inhale 1 puff into the lungs 2 (two) times a day., Disp: 1 Inhaler, Rfl: 12  Allergies  Allergen Reactions  . Robitussin (Alcohol Free) [Guaifenesin] Anaphylaxis  . Aspirin Other (See Comments)    Nose bleeds  . Tramadol     Due to Seizures per patient  .  Chocolate Flavor Rash  . Codeine Rash  . Strawberry Extract Rash    I personally reviewed active problem list, medication list, allergies with the patient/caregiver today.   ROS Constitutional: Negative for fever or weight change.  Respiratory: Positive for intermittent for cough and shortness of breath.   Cardiovascular: Negative for chest pain or palpitations.  Gastrointestinal: Negative for abdominal pain, no bowel changes.  Musculoskeletal: Negative for gait problem or joint swelling.  Skin: Negative for rash.  Neurological: Negative for dizziness or headache.  No other specific complaints in a complete review of systems (except as listed in HPI above).   Objective  Virtual encounter, vitals not obtained.  There is no height or weight on file to calculate BMI.  Physical Exam Constitutional: Patient appears well-developed and well-nourished. No distress.  HENT: Head: Normocephalic and atraumatic.  Neck: Normal range of motion. Pulmonary/Chest: Effort normal. No respiratory distress. Speaking in complete sentences Neurological: Pt is alert and oriented to person, place, and time. Coordination, speech and gait are normal.  Psychiatric: Patient has a normal mood and affect. behavior is normal. Judgment and thought content normal.  No results found for this or any previous visit (from the past 72 hour(s)).  PHQ2/9: [facility] 12/17/2018 12/08/2018 11/18/2018 11/14/2018 10/09/2018  Decreased Interest 2 1 3 1  0  Down, Depressed, Hopeless 3 1 2 2 2   PHQ - 2 Score 5 2 5 3 2   Altered sleeping 3 3 3 2 2   Tired, decreased energy 3 2 3 3 2   Change in  appetite 2 3 3 1 2   Feeling bad or failure about yourself  2 3 2 2 3   Trouble concentrating 2 2 2 2 3   Moving slowly or fidgety/restless 0 2 2 1  0  Suicidal thoughts 0 1 1 0 0  PHQ-9 Score 17 18 21 14 14   Difficult doing work/chores Very difficult Somewhat difficult Very difficult Very difficult Very difficult   PHQ-2/9 Result is positive.    Fall Risk: Fall Risk  12/17/2018 12/08/2018 11/18/2018 11/14/2018 10/09/2018  Falls in the past year? 0 0 1 1 1   Number falls in past yr: 0 0 0 0 0  Injury with Fall? 0 0 1 1 1   Follow up - Falls evaluation completed Falls evaluation completed - -    Assessment & Plan  1. Bipolar disorder, in partial remission, most recent episode depressed (HCC) - She will call [facility] today to get an appointment while she awaits psychiatry referral for management of her Depakote. - Ambulatory referral to Psychiatry - Will need new psychiatrist in August when she obtains private health insurance as [facility] does not accept private health insurance. - Continue current medications, will not adjust dosing as psychiatry appointment is to be made ASAP with [facility].  2. Moderate persistent asthma without complication - Albuterol PRN, quit smoking - fluticasone (FLOVENT HFA) 110 MCG/ACT inhaler; Inhale 1 puff into the lungs 2 (two) times a day.  Dispense: 1 Inhaler; Refill: 12  3. Leukocytosis, unspecified type - Recheck in 2-3 weeks once she is feeling well - CBC with Differential/Platelet  4. Tobacco abuse counseling - Advised she needs to quit, she has cut back to 1/3ppd and is congratulated on this.  I discussed the assessment and treatment plan with the patient. The patient was provided an opportunity to ask questions and all were answered. The patient agreed with the plan and demonstrated an understanding of the instructions.  The patient was advised to call back  or seek an in-person evaluation if the symptoms worsen or if the condition fails to improve as anticipated.  I  provided 18 minutes of non-face-to-face time during this encounter.

## 2018-12-18 NOTE — Telephone Encounter (Signed)
Left message for patient

## 2019-01-23 ENCOUNTER — Encounter: Payer: Self-pay | Admitting: Family Medicine

## 2019-01-23 DIAGNOSIS — F3175 Bipolar disorder, in partial remission, most recent episode depressed: Secondary | ICD-10-CM

## 2019-01-23 MED ORDER — FLUOXETINE HCL 10 MG PO CAPS: ORAL_CAPSULE | ORAL | 0 refills | Status: DC

## 2019-01-23 MED ORDER — DIVALPROEX SODIUM 250 MG PO DR TAB: DELAYED_RELEASE_TABLET | ORAL | 0 refills | Status: DC

## 2019-02-24 ENCOUNTER — Encounter: Payer: Self-pay | Admitting: Emergency Medicine

## 2019-02-24 ENCOUNTER — Emergency Department
Admission: EM | Admit: 2019-02-24 | Discharge: 2019-02-24 | Disposition: A | Discharge status: Home / Self Care | Payer: Commercial Managed Care - PPO | Attending: Emergency Medicine | Admitting: Emergency Medicine

## 2019-02-24 ENCOUNTER — Emergency Department: Payer: Commercial Managed Care - PPO

## 2019-02-24 ENCOUNTER — Other Ambulatory Visit: Payer: Self-pay

## 2019-02-24 DIAGNOSIS — B9789 Other viral agents as the cause of diseases classified elsewhere: Secondary | ICD-10-CM | POA: Diagnosis not present

## 2019-02-24 DIAGNOSIS — J988 Other specified respiratory disorders: Secondary | ICD-10-CM | POA: Diagnosis not present

## 2019-02-24 DIAGNOSIS — Z79899 Other long term (current) drug therapy: Secondary | ICD-10-CM | POA: Insufficient documentation

## 2019-02-24 DIAGNOSIS — F17210 Nicotine dependence, cigarettes, uncomplicated: Secondary | ICD-10-CM | POA: Insufficient documentation

## 2019-02-24 DIAGNOSIS — J45909 Unspecified asthma, uncomplicated: Secondary | ICD-10-CM | POA: Insufficient documentation

## 2019-02-24 DIAGNOSIS — R6883 Chills (without fever): Secondary | ICD-10-CM | POA: Diagnosis present

## 2019-02-24 MED ORDER — BENZONATATE 100 MG PO CAPS: 100.0000 mg | ORAL_CAPSULE | Freq: Three times a day (TID) | ORAL | 0 refills | Status: DC | PRN

## 2019-02-24 MED ORDER — ALBUTEROL SULFATE HFA 108 (90 BASE) MCG/ACT IN AERS
1.0000 | INHALATION_SPRAY | Freq: Once | RESPIRATORY_TRACT | Status: AC
  Administered 2019-02-24: 1 via RESPIRATORY_TRACT
  Filled 2019-02-24 (×2): qty 6.7

## 2019-02-24 NOTE — ED Triage Notes (Signed)
Says cough, causing chest to hurt and chills--says she has not had fever, but just cant get warm.

## 2019-02-24 NOTE — ED Provider Notes (Signed)
[facility] Emergency Department Provider Note  ____________________________________________  Time seen: Approximately 11:19 AM  I have reviewed the triage vital signs and the nursing notes.   HISTORY  Chief Complaint Cough and Chills    HPI [name] is a 33 y.o. female that presents to the emergency department for evaluation of chills, headache, nasal congestion, nonproductive cough, occasional shortness of breath, occasional chest pain, 2 episodes of vomiting and diarrhea for 4 days.  Patient states that she was sent home from work last week due to cough.  She has several coworkers that are sick with COVID 19.  Patient smokes a pack of cigarettes per day but has not been able to smoke since she has been sick.  Patient has a history of heartburn and has been taking heartburn medication for symptoms in hopes of relief.  Patient has a history of asthma but has been out of her albuterol inhaler.  She has a pending COVID test.   Past Medical History:  Diagnosis Date  . Allergy   . Asthma   . Bipolar 1 disorder (HCC)   . Bipolar disorder (HCC)   . Ectopic pregnancy   . GERD (gastroesophageal reflux disease)   . History of concussion 2016  . Hyperlipidemia   . Migraine   . MRSA (methicillin resistant Staphylococcus aureus)   . Pseudoseizures   . Scoliosis   . Scoliosis     Patient Active Problem List   Diagnosis Date Noted  . Need for diphtheria-tetanus-pertussis (Tdap) vaccine, adult/adolescent 04/05/2015  . Abnormal weight gain 03/12/2015  . Encounter for medication monitoring 03/12/2015  . Abdominal pain, acute, right lower quadrant 03/02/2015  . Bipolar affective disorder (HCC) 01/26/2015  . Convulsion (HCC) 01/25/2015  . Seizures (HCC) 01/12/2015  . Asthma   . Hyperlipidemia   . Bipolar disorder (HCC)   . Allergy   . MRSA (methicillin resistant Staphylococcus aureus)   . Migraine headache with aura     Past Surgical History:   Procedure Laterality Date  . CARPAL TUNNEL RELEASE  2015   left  . HERNIA REPAIR  2010  . TUBAL LIGATION      Prior to Admission medications   Medication Sig Start Date End Date Taking? Authorizing Provider  albuterol (PROVENTIL HFA;VENTOLIN HFA) 108 (90 Base) MCG/ACT inhaler Inhale 2 puffs into the lungs every 6 (six) hours as needed for wheezing or shortness of breath. 03/15/18   Baity, Salvadore Oxfordegina W, NP  benzonatate (TESSALON PERLES) 100 MG capsule Take 1 capsule (100 mg total) by mouth 3 (three) times daily as needed. 02/24/19 02/24/20  Enid DerryWagner, Jayonna Meyering, PA-C  budesonide (PULMICORT) 180 MCG/ACT inhaler Inhale 1 puff into the lungs 2 (two) times a day. 12/17/18   Doren CustardBoyce, Emily E, FNP  divalproex (DEPAKOTE) 250 MG DR tablet Taken 1 tablet once daily for 7 days, then increase to 1 tablet twice daily. 01/23/19   Doren CustardBoyce, Emily E, FNP  FLUoxetine (PROZAC) 10 MG capsule Take 2 tablets once daily 01/23/19   Doren CustardBoyce, Emily E, FNP  fluticasone [facility](FLONASE) 50 MCG/ACT nasal spray Place 1 spray into both nostrils 2 (two) times daily. 09/07/18   Cuthriell, Delorise RoyalsJonathan D, PA-C  Multiple Vitamin (MULTIVITAMIN) tablet Take 1 tablet by mouth daily.    [provider]  ondansetron (ZOFRAN-ODT) 4 MG disintegrating tablet Take 1 tablet (4 mg total) by mouth every 8 (eight) hours as needed for nausea or vomiting. 11/14/18   Poulose, Percell BeltElizabeth E, NP  vitamin B-12 (CYANOCOBALAMIN) 1000 MCG tablet  Take 1,000 mcg by mouth daily.    [provider]  vitamin C (ASCORBIC ACID) 500 MG tablet Take 500 mg by mouth daily.    [provider]    Allergies Robitussin (alcohol free) [guaifenesin], Aspirin, Tramadol, Chocolate flavor, Codeine, and Strawberry extract  Family History  Problem Relation Age of Onset  . Asthma Mother   . Heart disease Mother   . Diabetes Mother   . Hyperlipidemia Mother   . Hypertension Mother   . Mental illness Mother   . Migraines Mother   . Thyroid disease Mother   . Stroke Mother    . Hypertension Sister   . Cancer Maternal Grandmother   . Diabetes Maternal Grandmother   . Heart disease Maternal Grandmother   . Hyperlipidemia Maternal Grandmother   . Hypertension Maternal Grandmother   . Kidney disease Maternal Grandmother   . Heart disease Paternal Grandfather     Social History Social History   Tobacco Use  . Smoking status: Current Every Day Smoker    Packs/day: 1.00    Types: Cigarettes  . Smokeless tobacco: Never Used  Substance Use Topics  . Alcohol use: No    Alcohol/week: 0.0 standard drinks  . Drug use: No     Review of Systems  Constitutional: Positive for chills. Eyes: No visual changes. No discharge. ENT: Positive for congestion and rhinorrhea. Cardiovascular: Occassional chest pain with coughing. Respiratory: Positive for cough and occasional SOB. Gastrointestinal: No abdominal pain.  Positive for 2 episodes of vomiting.  Positive for diarrhea.  No constipation. Musculoskeletal: Positive for body aches. Skin: Negative for rash, abrasions, lacerations, ecchymosis. Neurological: Positive for headache.   ____________________________________________   PHYSICAL EXAM:  VITAL SIGNS: ED Triage Vitals [02/24/19 1012]  Enc Vitals Group     BP      Pulse Rate 75     Resp 16     Temp 98.8 F (37.1 C)     Temp Source Oral     SpO2 97 %     Weight 138 lb (62.6 kg)     Height 5\' 5"  (1.651 m)     Head Circumference      Peak Flow      Pain Score 6     Pain Loc      Pain Edu?      Excl. in [place]?      Constitutional: Alert and oriented. Well appearing and in no acute distress. Eyes: Conjunctivae are normal. PERRL. EOMI. No discharge. Head: Atraumatic. ENT: No frontal and maxillary sinus tenderness.      Ears: Tympanic membranes pearly gray with good landmarks. No discharge.      Nose: Mild congestion/rhinnorhea.      Mouth/Throat: Mucous membranes are moist. Oropharynx non-erythematous. Tonsils not enlarged. No exudates. Uvula  midline. Neck: No stridor.   Hematological/Lymphatic/Immunilogical: No cervical lymphadenopathy. Cardiovascular: Normal rate, regular rhythm.  Good peripheral circulation. Respiratory: Normal respiratory effort without tachypnea or retractions.  Scattered wheezes. Good air entry to the bases with no decreased or absent breath sounds. Gastrointestinal: Bowel sounds 4 quadrants. Soft and nontender to palpation. No guarding or rigidity. No palpable masses. No distention. Musculoskeletal: Full range of motion to all extremities. No gross deformities appreciated. Neurologic:  Normal speech and language. No gross focal neurologic deficits are appreciated.  Skin:  Skin is warm, dry and intact. No rash noted. Psychiatric: Mood and affect are normal. Speech and behavior are normal. Patient exhibits appropriate insight and judgement.   ____________________________________________  LABS (all labs ordered are listed, but only abnormal results are displayed)  Labs Reviewed - No data to display ____________________________________________  EKG   ____________________________________________  RADIOLOGY Lexine Baton, personally viewed and evaluated these images (plain radiographs) as part of my medical decision making, as well as reviewing the written report by the radiologist.  Dg Chest Portable 1 View  Result Date: 02/24/2019 CLINICAL DATA:  Cough, chills, asthma. EXAM: PORTABLE CHEST 1 VIEW COMPARISON:  Chest x-ray dated 03/14/2016. FINDINGS: The heart size and mediastinal contours are within normal limits. Both lungs are clear. The visualized skeletal structures are unremarkable. IMPRESSION: No active disease. Electronically Signed   By: Bary Richard M.D.   On: 02/24/2019 10:53    ____________________________________________    PROCEDURES  Procedure(s) performed:    Procedures    Medications  albuterol (VENTOLIN HFA) 108 (90 Base) MCG/ACT inhaler 1 puff (1 puff Inhalation  Given 02/24/19 1202)     ____________________________________________   INITIAL IMPRESSION / ASSESSMENT AND PLAN / ED COURSE  Pertinent labs & imaging results that were available during my care of the patient were reviewed by me and considered in my medical decision making (see chart for details).  Review of the [place] CSRS was performed in accordance of the [facility] prior to dispensing any controlled drugs.   Patient's diagnosis is consistent with viral illness. Vital signs and exam are reassuring.  Chest x-ray negative for acute cardiopulmonary processes.  Patient has a pending COVID test from work. Patient appears well. Patient was given an albuterol inhaler in ED, as hers is out.  Patient feels comfortable going home. Patient will be discharged home with prescriptions for tessalon perles. Patient is to follow up with primary care and health department as needed or otherwise directed. Patient is given ED precautions to return to the ED for any worsening or new symptoms.   [name] was evaluated in Emergency Department on 02/24/2019 for the symptoms described in the history of present illness. She was evaluated in the context of the global COVID-19 pandemic, which necessitated consideration that the patient might be at risk for infection with the SARS-CoV-2 virus that causes COVID-19. Institutional protocols and algorithms that pertain to the evaluation of patients at risk for COVID-19 are in a state of rapid change based on information released by regulatory bodies including the CDC and federal and state organizations. These policies and algorithms were followed during the patient's care in the ED.  ____________________________________________  FINAL CLINICAL IMPRESSION(S) / ED DIAGNOSES  Final diagnoses:  Viral respiratory illness      NEW MEDICATIONS STARTED DURING THIS VISIT:  ED Discharge Orders         Ordered    benzonatate (TESSALON PERLES) 100 MG capsule  3 times daily PRN      02/24/19 1159              This chart was dictated using voice recognition software/Dragon. Despite best efforts to proofread, errors can occur which can change the meaning. Any change was purely unintentional.    Enid Derry, PA-C 02/24/19 1258    Jene Every, MD 02/24/19 1343

## 2019-03-16 ENCOUNTER — Ambulatory Visit: Payer: Self-pay | Admitting: Psychology

## 2019-04-01 ENCOUNTER — Emergency Department
Admission: EM | Admit: 2019-04-01 | Discharge: 2019-04-01 | Disposition: A | Discharge status: Home / Self Care | Payer: Commercial Managed Care - PPO | Attending: Student in an Organized Health Care Education/Training Program | Admitting: Student in an Organized Health Care Education/Training Program

## 2019-04-01 ENCOUNTER — Other Ambulatory Visit: Payer: Self-pay

## 2019-04-01 ENCOUNTER — Emergency Department: Payer: Commercial Managed Care - PPO

## 2019-04-01 DIAGNOSIS — R0789 Other chest pain: Secondary | ICD-10-CM | POA: Diagnosis not present

## 2019-04-01 DIAGNOSIS — M79661 Pain in right lower leg: Secondary | ICD-10-CM | POA: Diagnosis not present

## 2019-04-01 DIAGNOSIS — R0602 Shortness of breath: Secondary | ICD-10-CM | POA: Diagnosis not present

## 2019-04-01 DIAGNOSIS — F17210 Nicotine dependence, cigarettes, uncomplicated: Secondary | ICD-10-CM | POA: Insufficient documentation

## 2019-04-01 DIAGNOSIS — Z20828 Contact with and (suspected) exposure to other viral communicable diseases: Secondary | ICD-10-CM | POA: Insufficient documentation

## 2019-04-01 DIAGNOSIS — J45909 Unspecified asthma, uncomplicated: Secondary | ICD-10-CM | POA: Diagnosis not present

## 2019-04-01 DIAGNOSIS — R079 Chest pain, unspecified: Secondary | ICD-10-CM | POA: Diagnosis present

## 2019-04-01 DIAGNOSIS — Z79899 Other long term (current) drug therapy: Secondary | ICD-10-CM | POA: Diagnosis not present

## 2019-04-01 LAB — BASIC METABOLIC PANEL
Anion gap: 9 (ref 5–15)
BUN: 13 mg/dL (ref 6–20)
CO2: 24 mmol/L (ref 22–32)
Calcium: 9.2 mg/dL (ref 8.9–10.3)
Chloride: 104 mmol/L (ref 98–111)
Creatinine, Ser: 0.58 mg/dL (ref 0.44–1.00)
GFR calc Af Amer: >60 mL/min (ref >60)
GFR calc non Af Amer: >60 mL/min (ref >60)
Glucose, Bld: 152 mg/dL — ABNORMAL HIGH (ref 70–99)
Potassium: 3.6 mmol/L (ref 3.5–5.1)
Sodium: 137 mmol/L (ref 135–145)

## 2019-04-01 LAB — TROPONIN I (HIGH SENSITIVITY)
Troponin I (High Sensitivity): <2 ng/L (ref <18)
Troponin I (High Sensitivity): <2 ng/L (ref <18)

## 2019-04-01 LAB — CBC
HCT: 35.7 % — ABNORMAL LOW (ref 36.0–46.0)
Hemoglobin: 12.2 g/dL (ref 12.0–15.0)
MCH: 31 pg (ref 26.0–34.0)
MCHC: 34.2 g/dL (ref 30.0–36.0)
MCV: 90.8 fL (ref 80.0–100.0)
Platelets: 231 10*3/uL (ref 150–400)
RBC: 3.93 MIL/uL (ref 3.87–5.11)
RDW: 12.3 % (ref 11.5–15.5)
WBC: 12.7 10*3/uL — ABNORMAL HIGH (ref 4.0–10.5)
nRBC: 0 % (ref 0.0–0.2)

## 2019-04-01 LAB — FIBRIN DERIVATIVES D-DIMER (ARMC ONLY): Fibrin derivatives D-dimer (ARMC): 321.18 ng/mL (FEU) (ref 0.00–499.00)

## 2019-04-01 MED ORDER — ALBUTEROL SULFATE HFA 108 (90 BASE) MCG/ACT IN AERS: 2.0000 | INHALATION_SPRAY | Freq: Four times a day (QID) | RESPIRATORY_TRACT | 1 refills | Status: DC | PRN

## 2019-04-01 NOTE — ED Notes (Signed)
Will collect grn and blue tubes once US done.

## 2019-04-01 NOTE — ED Notes (Signed)
Significant other at bedside with pt

## 2019-04-01 NOTE — ED Notes (Signed)
Attempted for IV at L ac. Was able to send blue tube to lab but then vessel blew.

## 2019-04-01 NOTE — ED Provider Notes (Signed)
[facility] Emergency Department Provider Note    First MD Initiated Contact with Patient 04/01/19 1931     (approximate)  I have reviewed the triage vital signs and the nursing notes.   HISTORY  Chief Complaint Chest Pain    HPI Niel HummerHeather M Palmatier is a 33 y.o. female has a past medical history presents to the ER for several hours of midsternal pleuritic chest pain associated with feeling she needs to clear her throat shortness of breath.  Has also had some generalized fatigue over the past several days.  Works at [employer] where she says there are several other coworkers have been diagnosed with Covid.  Also complaining of right calf pain.    Past Medical History:  Diagnosis Date  . Allergy   . Asthma   . Bipolar 1 disorder (HCC)   . Bipolar disorder (HCC)   . Ectopic pregnancy   . GERD (gastroesophageal reflux disease)   . History of concussion 2016  . Hyperlipidemia   . Migraine   . MRSA (methicillin resistant Staphylococcus aureus)   . Pseudoseizures   . Scoliosis   . Scoliosis    Family History  Problem Relation Age of Onset  . Asthma Mother   . Heart disease Mother   . Diabetes Mother   . Hyperlipidemia Mother   . Hypertension Mother   . Mental illness Mother   . Migraines Mother   . Thyroid disease Mother   . Stroke Mother   . Hypertension Sister   . Cancer Maternal Grandmother   . Diabetes Maternal Grandmother   . Heart disease Maternal Grandmother   . Hyperlipidemia Maternal Grandmother   . Hypertension Maternal Grandmother   . Kidney disease Maternal Grandmother   . Heart disease Paternal Grandfather    Past Surgical History:  Procedure Laterality Date  . CARPAL TUNNEL RELEASE  2015   left  . HERNIA REPAIR  2010  . TUBAL LIGATION     Patient Active Problem List   Diagnosis Date Noted  . Need for diphtheria-tetanus-pertussis (Tdap) vaccine, adult/adolescent 04/05/2015  . Abnormal weight gain 03/12/2015  . Encounter for  medication monitoring 03/12/2015  . Abdominal pain, acute, right lower quadrant 03/02/2015  . Bipolar affective disorder (HCC) 01/26/2015  . Convulsion (HCC) 01/25/2015  . Seizures (HCC) 01/12/2015  . Asthma   . Hyperlipidemia   . Bipolar disorder (HCC)   . Allergy   . MRSA (methicillin resistant Staphylococcus aureus)   . Migraine headache with aura       Prior to Admission medications   Medication Sig Start Date End Date Taking? Authorizing Provider  albuterol (PROVENTIL HFA;VENTOLIN HFA) 108 (90 Base) MCG/ACT inhaler Inhale 2 puffs into the lungs every 6 (six) hours as needed for wheezing or shortness of breath. 03/15/18   Baity, Salvadore Oxfordegina W, NP  benzonatate (TESSALON PERLES) 100 MG capsule Take 1 capsule (100 mg total) by mouth 3 (three) times daily as needed. 02/24/19 02/24/20  Enid DerryWagner, Ashley, PA-C  budesonide (PULMICORT) 180 MCG/ACT inhaler Inhale 1 puff into the lungs 2 (two) times a day. 12/17/18   Doren CustardBoyce, Emily E, FNP  divalproex (DEPAKOTE) 250 MG DR tablet Taken 1 tablet once daily for 7 days, then increase to 1 tablet twice daily. 01/23/19   Doren CustardBoyce, Emily E, FNP  FLUoxetine (PROZAC) 10 MG capsule Take 2 tablets once daily 01/23/19   Doren CustardBoyce, Emily E, FNP  fluticasone [facility](FLONASE) 50 MCG/ACT nasal spray Place 1 spray into both nostrils 2 (two) times daily.  09/07/18   Cuthriell, Delorise Royals, PA-C  Multiple Vitamin (MULTIVITAMIN) tablet Take 1 tablet by mouth daily.    [provider]  ondansetron (ZOFRAN-ODT) 4 MG disintegrating tablet Take 1 tablet (4 mg total) by mouth every 8 (eight) hours as needed for nausea or vomiting. 11/14/18   Poulose, Percell Belt, NP  vitamin B-12 (CYANOCOBALAMIN) 1000 MCG tablet Take 1,000 mcg by mouth daily.    [provider]  vitamin C (ASCORBIC ACID) 500 MG tablet Take 500 mg by mouth daily.    [provider]    Allergies Robitussin (alcohol free) [guaifenesin], Aspirin, Tramadol, Chocolate flavor, Codeine, and Strawberry  extract    Social History Social History   Tobacco Use  . Smoking status: Current Every Day Smoker    Packs/day: 1.00    Types: Cigarettes  . Smokeless tobacco: Never Used  Substance Use Topics  . Alcohol use: No    Alcohol/week: 0.0 standard drinks  . Drug use: No    Review of Systems Patient denies headaches, rhinorrhea, blurry vision, numbness, shortness of breath, chest pain, edema, cough, abdominal pain, nausea, vomiting, diarrhea, dysuria, fevers, rashes or hallucinations unless otherwise stated above in HPI. ____________________________________________   PHYSICAL EXAM:  VITAL SIGNS: Vitals:   04/01/19 1839  BP: 113/72  Pulse: (!) 110  Resp: 18  Temp: 98.2 F (36.8 C)  SpO2: 98%    Constitutional: Alert and oriented.  Eyes: Conjunctivae are normal.  Head: Atraumatic. Nose: No congestion/rhinnorhea. Mouth/Throat: Mucous membranes are moist.   Neck: No stridor. Painless ROM.  Cardiovascular: Normal rate, regular rhythm. Grossly normal heart sounds.  Good peripheral circulation. Respiratory: Normal respiratory effort.  No retractions. Lungs CTAB. Gastrointestinal: Soft and nontender. No distention. No abdominal bruits. No CVA tenderness. Genitourinary:  Musculoskeletal: No lower extremity tenderness nor edema.  No joint effusions. Neurologic:  Normal speech and language. No gross focal neurologic deficits are appreciated. No facial droop Skin:  Skin is warm, dry and intact. No rash noted. Psychiatric: Mood and affect are normal. Speech and behavior are normal.  ____________________________________________   LABS (all labs ordered are listed, but only abnormal results are displayed)  Results for orders placed or performed during the hospital encounter of 04/01/19 (from the past 24 hour(s))  Basic metabolic panel     Status: Abnormal   Collection Time: 04/01/19  6:45 PM  Result Value Ref Range   Sodium 137 135 - 145 mmol/L   Potassium 3.6 3.5 - 5.1 mmol/L    Chloride 104 98 - 111 mmol/L   CO2 24 22 - 32 mmol/L   Glucose, Bld 152 (H) 70 - 99 mg/dL   BUN 13 6 - 20 mg/dL   Creatinine, Ser 9.39 0.44 - 1.00 mg/dL   Calcium 9.2 8.9 - 03.0 mg/dL   GFR calc non Af Amer >60 >60 mL/min   GFR calc Af Amer >60 >60 mL/min   Anion gap 9 5 - 15  CBC     Status: Abnormal   Collection Time: 04/01/19  6:45 PM  Result Value Ref Range   WBC 12.7 (H) 4.0 - 10.5 K/uL   RBC 3.93 3.87 - 5.11 MIL/uL   Hemoglobin 12.2 12.0 - 15.0 g/dL   HCT 09.2 (L) 33.0 - 07.6 %   MCV 90.8 80.0 - 100.0 fL   MCH 31.0 26.0 - 34.0 pg   MCHC 34.2 30.0 - 36.0 g/dL   RDW 22.6 33.3 - 54.5 %   Platelets 231 150 - 400 K/uL  nRBC 0.0 0.0 - 0.2 %  Troponin I (High Sensitivity)     Status: None   Collection Time: 04/01/19  6:45 PM  Result Value Ref Range   Troponin I (High Sensitivity) <2 <18 ng/L  Fibrin derivatives D-Dimer ([facility] only)     Status: None   Collection Time: 04/01/19  8:24 PM  Result Value Ref Range   Fibrin derivatives D-dimer ([facility]) 321.18 0.00 - 499.00 ng/mL (FEU)  Troponin I (High Sensitivity)     Status: None   Collection Time: 04/01/19  8:24 PM  Result Value Ref Range   Troponin I (High Sensitivity) <2 <18 ng/L   ____________________________________________  EKG My review and personal interpretation at Time: 18:38   Indication: chest pain  Rate: 105  Rhythm: sinus Axis: normal Other: normal intervals, no stemi ____________________________________________  RADIOLOGY  I personally reviewed all radiographic images ordered to evaluate for the above acute complaints and reviewed radiology reports and findings.  These findings were personally discussed with the patient.  Please see medical record for radiology report.  ____________________________________________   PROCEDURES  Procedure(s) performed:  Procedures    Critical Care performed: no ____________________________________________   INITIAL IMPRESSION / ASSESSMENT AND PLAN / ED  COURSE  Pertinent labs & imaging results that were available during my care of the patient were reviewed by me and considered in my medical decision making (see chart for details).   DDX: ACS, pericarditis, esophagitis, boerhaaves, pe, dissection, pna, bronchitis, costochondritis   IDA MILBRATH is a 33 y.o. who presents to the ED with symptoms as described above.  Patient well-appearing nontoxic.  Describing symptoms sound consistent with bronchitis.  Does have some pleuritic discomfort.  No hypoxia or distress.  EKG is nonischemic has negative enzymes.  She does not have any signs of pneumonia.  Low risk by Wells criteria and is PERC negative.  Will test for COVID but I think she is stable and appropriate for outpatient follow-up.     The patient was evaluated in Emergency Department today for the symptoms described in the history of present illness. He/she was evaluated in the context of the global COVID-19 pandemic, which necessitated consideration that the patient might be at risk for infection with the SARS-CoV-2 virus that causes COVID-19. Institutional protocols and algorithms that pertain to the evaluation of patients at risk for COVID-19 are in a state of rapid change based on information released by regulatory bodies including the CDC and federal and state organizations. These policies and algorithms were followed during the patient's care in the ED.  As part of my medical decision making, I reviewed the following data within the [place] notes reviewed and incorporated, Labs reviewed, notes from prior ED visits and [place] Controlled Substance Database   ____________________________________________   FINAL CLINICAL IMPRESSION(S) / ED DIAGNOSES  Final diagnoses:  Atypical chest pain      NEW MEDICATIONS STARTED DURING THIS VISIT:  New Prescriptions   No medications on file     Note:  This document was prepared using Dragon voice recognition  software and may include unintentional dictation errors.    Merlyn Lot, MD 04/01/19 2131

## 2019-04-01 NOTE — ED Notes (Signed)
Blue and grn tubes sent to lab.

## 2019-04-01 NOTE — ED Notes (Signed)
Per lab, need redraw on blue tube. Will send new sample.

## 2019-04-01 NOTE — ED Triage Notes (Signed)
Pt arrives with complaints of chest tightness and fatigue that started "a few days ago."

## 2019-04-01 NOTE — ED Notes (Signed)
Call lab to see if they had a blue tube already. Don't. Will send.

## 2019-04-03 LAB — NOVEL CORONAVIRUS, NAA (HOSP ORDER, SEND-OUT TO REF LAB; TAT 18-24 HRS): SARS-CoV-2, NAA: NOT DETECTED

## 2019-05-03 ENCOUNTER — Encounter: Payer: Self-pay | Admitting: Emergency Medicine

## 2019-05-03 ENCOUNTER — Emergency Department
Admission: EM | Admit: 2019-05-03 | Discharge: 2019-05-03 | Disposition: A | Discharge status: Home / Self Care | Payer: Commercial Managed Care - PPO | Attending: Emergency Medicine | Admitting: Emergency Medicine

## 2019-05-03 ENCOUNTER — Other Ambulatory Visit: Payer: Self-pay

## 2019-05-03 DIAGNOSIS — J45909 Unspecified asthma, uncomplicated: Secondary | ICD-10-CM | POA: Diagnosis not present

## 2019-05-03 DIAGNOSIS — F17210 Nicotine dependence, cigarettes, uncomplicated: Secondary | ICD-10-CM | POA: Insufficient documentation

## 2019-05-03 DIAGNOSIS — R569 Unspecified convulsions: Secondary | ICD-10-CM | POA: Diagnosis not present

## 2019-05-03 DIAGNOSIS — Z79899 Other long term (current) drug therapy: Secondary | ICD-10-CM | POA: Diagnosis not present

## 2019-05-03 DIAGNOSIS — L253 Unspecified contact dermatitis due to other chemical products: Secondary | ICD-10-CM

## 2019-05-03 DIAGNOSIS — L245 Irritant contact dermatitis due to other chemical products: Secondary | ICD-10-CM | POA: Diagnosis not present

## 2019-05-03 DIAGNOSIS — M79602 Pain in left arm: Secondary | ICD-10-CM | POA: Diagnosis present

## 2019-05-03 MED ORDER — PREDNISONE 20 MG PO TABS: 20.0000 mg | ORAL_TABLET | Freq: Two times a day (BID) | ORAL | 0 refills | Status: AC

## 2019-05-03 MED ORDER — PREDNISONE 20 MG PO TABS
60.0000 mg | ORAL_TABLET | Freq: Once | ORAL | Status: AC
  Administered 2019-05-03: 60 mg via ORAL
  Filled 2019-05-03: qty 3

## 2019-05-03 MED ORDER — TRIAMCINOLONE ACETONIDE 0.1 % EX OINT: 1.0000 "application " | TOPICAL_OINTMENT | Freq: Two times a day (BID) | CUTANEOUS | 0 refills | Status: DC

## 2019-05-03 MED ORDER — FAMOTIDINE 20 MG PO TABS
40.0000 mg | ORAL_TABLET | Freq: Once | ORAL | Status: AC
  Administered 2019-05-03: 40 mg via ORAL
  Filled 2019-05-03: qty 2

## 2019-05-03 MED ORDER — FAMOTIDINE 20 MG PO TABS: 20.0000 mg | ORAL_TABLET | Freq: Two times a day (BID) | ORAL | 0 refills | Status: DC

## 2019-05-03 MED ORDER — DIPHENHYDRAMINE HCL 25 MG PO CAPS
25.0000 mg | ORAL_CAPSULE | Freq: Once | ORAL | Status: AC
  Administered 2019-05-03: 25 mg via ORAL
  Filled 2019-05-03: qty 1

## 2019-05-03 NOTE — ED Triage Notes (Signed)
Pt arrived via POV with reports arm in the left arm after donating blood on Thursday.  Pt also reports rash to left arm. Pt states she feels a popping when she moves her arm.

## 2019-05-03 NOTE — Discharge Instructions (Signed)
You are being treated for an allergic reaction in your skin from the probable use of chlorhexidine soap. Take the prescription meds as directed. Avoid the use of hot showers to reduce itching. Follow-up with your provider as needed.

## 2019-05-03 NOTE — ED Provider Notes (Signed)
[facility] Emergency Department Provider Note ____________________________________________  Time seen: 43  I have reviewed the triage vital signs and the nursing notes.  HISTORY  Chief Complaint  Arm Pain  HPI [name] is a 33 y.o. female presents to the ED for evaluation of left arm pain.  She admits to donating blood on Thursday, after which she noticed the onset of a rash to the left arm.  Patient also reports a sensation of "popping" and intermittent distal paresthesias, when she moves her arm.  She denies any interim fevers, chills, or sweats.  She denies any distal paresthesias, bruising, ecchymosis, or weakness.   Past Medical History:  Diagnosis Date  . Allergy   . Asthma   . Bipolar 1 disorder ([place])   . Bipolar disorder ([place])   . Ectopic pregnancy   . GERD (gastroesophageal reflux disease)   . History of concussion 2016  . Hyperlipidemia   . Migraine   . MRSA (methicillin resistant Staphylococcus aureus)   . Pseudoseizures   . Scoliosis   . Scoliosis     Patient Active Problem List   Diagnosis Date Noted  . Need for diphtheria-tetanus-pertussis (Tdap) vaccine, adult/adolescent 04/05/2015  . Abnormal weight gain 03/12/2015  . Encounter for medication monitoring 03/12/2015  . Abdominal pain, acute, right lower quadrant 03/02/2015  . Bipolar affective disorder ([place]) 01/26/2015  . Convulsion ([place]) 01/25/2015  . Seizures ([place]) 01/12/2015  . Asthma   . Hyperlipidemia   . Bipolar disorder ([place])   . Allergy   . MRSA (methicillin resistant Staphylococcus aureus)   . Migraine headache with aura     Past Surgical History:  Procedure Laterality Date  . CARPAL TUNNEL RELEASE  2015   left  . HERNIA REPAIR  2010  . TUBAL LIGATION      Prior to Admission medications   Medication Sig Start Date End Date Taking? Authorizing Provider  albuterol (VENTOLIN HFA) 108 (90 Base) MCG/ACT inhaler Inhale 2 puffs into the lungs every 6 (six)  hours as needed for wheezing or shortness of breath. 04/01/19   Merlyn Lot, MD  benzonatate (TESSALON PERLES) 100 MG capsule Take 1 capsule (100 mg total) by mouth 3 (three) times daily as needed. 02/24/19 02/24/20  Laban Emperor, PA-C  budesonide (PULMICORT) 180 MCG/ACT inhaler Inhale 1 puff into the lungs 2 (two) times a day. 12/17/18   Hubbard Hartshorn, FNP  divalproex (DEPAKOTE) 250 MG DR tablet Taken 1 tablet once daily for 7 days, then increase to 1 tablet twice daily. 01/23/19   Hubbard Hartshorn, FNP  famotidine (PEPCID) 20 MG tablet Take 1 tablet (20 mg total) by mouth 2 (two) times daily for 10 days. 05/03/19 05/13/19  Jheremy Boger, Dannielle Karvonen, PA-C  FLUoxetine (PROZAC) 10 MG capsule Take 2 tablets once daily 01/23/19   Hubbard Hartshorn, FNP  fluticasone [facility]) 50 MCG/ACT nasal spray Place 1 spray into both nostrils 2 (two) times daily. 09/07/18   Cuthriell, Charline Bills, PA-C  Multiple Vitamin (MULTIVITAMIN) tablet Take 1 tablet by mouth daily.    [provider]  ondansetron (ZOFRAN-ODT) 4 MG disintegrating tablet Take 1 tablet (4 mg total) by mouth every 8 (eight) hours as needed for nausea or vomiting. 11/14/18   Poulose, Bethel Born, NP  predniSONE (DELTASONE) 20 MG tablet Take 1 tablet (20 mg total) by mouth 2 (two) times daily with a meal for 5 days. 05/03/19 05/08/19  Taneya Conkel, Dannielle Karvonen, PA-C  triamcinolone ointment (KENALOG) 0.1 %  Apply 1 application topically 2 (two) times daily. 05/03/19   Alleigh Mollica, Charlesetta IvoryJenise V Bacon, PA-C  vitamin B-12 (CYANOCOBALAMIN) 1000 MCG tablet Take 1,000 mcg by mouth daily.    [provider]  vitamin C (ASCORBIC ACID) 500 MG tablet Take 500 mg by mouth daily.    [provider]    Allergies Robitussin (alcohol free) [guaifenesin], Aspirin, Tramadol, Chocolate flavor, Codeine, and Strawberry extract  Family History  Problem Relation Age of Onset  . Asthma Mother   . Heart disease Mother   . Diabetes Mother   . Hyperlipidemia Mother    . Hypertension Mother   . Mental illness Mother   . Migraines Mother   . Thyroid disease Mother   . Stroke Mother   . Hypertension Sister   . Cancer Maternal Grandmother   . Diabetes Maternal Grandmother   . Heart disease Maternal Grandmother   . Hyperlipidemia Maternal Grandmother   . Hypertension Maternal Grandmother   . Kidney disease Maternal Grandmother   . Heart disease Paternal Grandfather     Social History Social History   Tobacco Use  . Smoking status: Current Every Day Smoker    Packs/day: 1.00    Types: Cigarettes  . Smokeless tobacco: Never Used  Substance Use Topics  . Alcohol use: No    Alcohol/week: 0.0 standard drinks  . Drug use: No    Review of Systems  Constitutional: Negative for fever. Cardiovascular: Negative for chest pain. Respiratory: Negative for shortness of breath. Musculoskeletal: Negative for back pain. Skin: Positive for rash. Neurological: Negative for headaches, focal weakness or numbness. ____________________________________________  PHYSICAL EXAM:  VITAL SIGNS: ED Triage Vitals  Enc Vitals Group     BP 05/03/19 1713 110/73     Pulse Rate 05/03/19 1713 96     Resp 05/03/19 1713 16     Temp 05/03/19 1713 98.8 F (37.1 C)     Temp Source 05/03/19 1713 Oral     SpO2 05/03/19 1713 100 %     Weight 05/03/19 1711 149 lb (67.6 kg)     Height 05/03/19 1711 5\' 5"  (1.651 m)     Head Circumference --      Peak Flow --      Pain Score 05/03/19 1711 5     Pain Loc --      Pain Edu? --      Excl. in GC? --     Constitutional: Alert and oriented. Well appearing and in no distress. Head: Normocephalic and atraumatic. Eyes: Conjunctivae are normal. Normal extraocular movements Cardiovascular: Normal rate, regular rhythm. Normal distal pulses. Respiratory: Normal respiratory effort.  Musculoskeletal: Normal composite fist bilaterally.  Nontender with normal range of motion in all extremities.  Neurologic: Cranial nerves II through  XII grossly intact.  Normal intrinsic and opposition testing noted.  Normal gait without ataxia. Normal speech and language. No gross focal neurologic deficits are appreciated. Skin:  Skin is warm, dry and intact.  Patient with erythematous maculopapular rash noted to the volar aspect of the left forearm.  There is a local area of ecchymosis consistent with a recent venipuncture.  No cyanosis, clubbing, or edema noted distally. Psychiatric: Mood and affect are normal. Patient exhibits appropriate insight and judgment. ____________________________________________  PROCEDURES  Prednisone 60 mg PO Famotidine 40 mg PO Benadryl 25 mg PO Procedures ____________________________________________  INITIAL IMPRESSION / ASSESSMENT AND PLAN / ED COURSE  Patient with ED evaluation of local skin irritation following a blood donation.  She presents with  itching and redness to the antecubital region of the left forearm, after chlorhexidine soap was used to cleanse the area prior to venipuncture.  Patient's exam is overall benign and reassuring at this time with no signs of any phlebitis, thrombophlebitis, or superficial cellulitis.  She will be treated for a contact dermatitis with combination of antihistamine and steroid therapy.  She will be discharged with instructions to avoid hot showers, and return to the ED as needed.  She will follow with primary provider for ongoing symptoms.  [name] was evaluated in Emergency Department on 05/03/2019 for the symptoms described in the history of present illness. She was evaluated in the context of the global COVID-19 pandemic, which necessitated consideration that the patient might be at risk for infection with the SARS-CoV-2 virus that causes COVID-19. Institutional protocols and algorithms that pertain to the evaluation of patients at risk for COVID-19 are in a state of rapid change based on information released by regulatory bodies including the CDC and federal  and state organizations. These policies and algorithms were followed during the patient's care in the ED. ____________________________________________  FINAL CLINICAL IMPRESSION(S) / ED DIAGNOSES  Final diagnoses:  Contact dermatitis due to other chemical product, unspecified contact dermatitis type      Lissa Hoard, PA-C 05/03/19 1928    Phineas Semen, MD 05/03/19 1946

## 2019-05-27 ENCOUNTER — Ambulatory Visit (INDEPENDENT_AMBULATORY_CARE_PROVIDER_SITE_OTHER): Payer: Commercial Managed Care - PPO | Admitting: Psychology

## 2019-05-27 DIAGNOSIS — F3130 Bipolar disorder, current episode depressed, mild or moderate severity, unspecified: Secondary | ICD-10-CM

## 2019-06-23 ENCOUNTER — Ambulatory Visit (INDEPENDENT_AMBULATORY_CARE_PROVIDER_SITE_OTHER): Payer: Commercial Managed Care - PPO | Admitting: Psychology

## 2019-06-23 DIAGNOSIS — F3130 Bipolar disorder, current episode depressed, mild or moderate severity, unspecified: Secondary | ICD-10-CM | POA: Diagnosis not present

## 2019-06-25 ENCOUNTER — Encounter (HOSPITAL_COMMUNITY): Payer: Self-pay | Admitting: *Deleted

## 2019-06-25 ENCOUNTER — Encounter (HOSPITAL_COMMUNITY): Payer: Self-pay | Admitting: Psychiatry

## 2019-06-25 ENCOUNTER — Ambulatory Visit (INDEPENDENT_AMBULATORY_CARE_PROVIDER_SITE_OTHER): Payer: Commercial Managed Care - PPO | Admitting: Psychiatry

## 2019-06-25 ENCOUNTER — Other Ambulatory Visit: Payer: Self-pay

## 2019-06-25 ENCOUNTER — Other Ambulatory Visit (HOSPITAL_COMMUNITY): Payer: Self-pay | Admitting: *Deleted

## 2019-06-25 DIAGNOSIS — F3175 Bipolar disorder, in partial remission, most recent episode depressed: Secondary | ICD-10-CM

## 2019-06-25 DIAGNOSIS — F3170 Bipolar disorder, currently in remission, most recent episode unspecified: Secondary | ICD-10-CM

## 2019-06-25 MED ORDER — FLUOXETINE HCL 20 MG PO CAPS: 20.0000 mg | ORAL_CAPSULE | Freq: Every day | ORAL | 2 refills | Status: DC

## 2019-06-25 MED ORDER — DIVALPROEX SODIUM ER 500 MG PO TB24: 500.0000 mg | ORAL_TABLET | Freq: Every day | ORAL | 1 refills | Status: DC

## 2019-06-25 NOTE — Progress Notes (Signed)
Psychiatric Initial Adult Assessment   Patient Identification: [name] MRN:  [id number] Date of Evaluation:  06/25/2019 Referral Source: Maurice Small FNP Chief Complaint:   Chief Complaint    Establish Care    Interview was conducted using WebEx teleconferencing application and I verified that I was speaking with the correct person using two identifiers. I discussed the limitations of evaluation and management by telemedicine and  the availability of in person appointments. Patient expressed understanding and agreed to proceed.  Visit Diagnosis:    ICD-10-CM   1. Bipolar disorder, in partial remission, most recent episode depressed (HCC)  F31.75 FLUoxetine (PROZAC) 20 MG capsule    History of Present Illness:  [name] is a 33 yo separated white female with long hx of treatment for disorder who comes to establish regular psychiatric follow up.. She has been diagnosed and treated for this condition  Since age 40. She has a hx of manic episodes lasting up to 5 days which include increased energy, decreased need for sleep, racing thoughts, distractibility, increase in activity/restlessness. They have been disruptive to her daily functioning  But she did not need to quit her job because of these in the past. She would have 5-6 of such episodes at the most per year. They are followed by depressive episodes which are much longer (one month or so) and were often severe enough that she felt suicidal and needed to be hospitalized. She has been in hospital 6 times: 5 as a teenager last one in her 31s. She denies having hx of suicidal attempts or psychosis. Sh does not abuse alcohol or drugs. She has been on a combination of fluoxetine and divalproex on and off for many years - it seems to work relatively well. She has been followed by Dr. Elesa Massed at [facility] earlier but she cannot see [facility] providers now that she has a Nurse, learning disability. She had a break in treatment and was restarted on both medications in  May this year. Valproic acid level on 250 mg was 33 mcg/ml and dose was then doubled. In the past she was on 500 mg extended release at bedtime - it was helping with middle insomnia (she typically wakes up several times although gets total of 7-8 hrs of sleep per night).   Medical hx of of migraine headaches, asthma, chronic back pain and pseudoseizures.  She is separated, lives with her two children (65 yo son and 48 yo daughter). She works for [employer]. She smokes 1 ppd but will start a program to help her quit.  Associated Signs/Symptoms: Depression Symptoms:  fatigue, disturbed sleep, (Hypo) Manic Symptoms:  Flight of Ideas, Anxiety Symptoms:  Excessive Worry, Psychotic Symptoms:  None PTSD Symptoms: Negative  Past Psychiatric History: See above.  Previous Psychotropic Medications: trials of quetiapine, alprazolam, lorazepam, hydroxyzine.  Substance Abuse History in the last 12 months:  No.  Consequences of Substance Abuse: NA  Past Medical History:  Past Medical History:  Diagnosis Date  . Allergy   . Asthma   . Bipolar 1 disorder (HCC)   . Bipolar disorder (HCC)   . Ectopic pregnancy   . GERD (gastroesophageal reflux disease)   . History of concussion 2016  . Hyperlipidemia   . Migraine   . MRSA (methicillin resistant Staphylococcus aureus)   . Pseudoseizures   . Scoliosis   . Scoliosis     Past Surgical History:  Procedure Laterality Date  . CARPAL TUNNEL RELEASE  2015   left  .  HERNIA REPAIR  2010  . TUBAL LIGATION      Family Psychiatric History: Reviewed.  Family History:  Family History  Problem Relation Age of Onset  . Asthma Mother   . Heart disease Mother   . Diabetes Mother   . Hyperlipidemia Mother   . Hypertension Mother   . Mental illness Mother   . Migraines Mother   . Thyroid disease Mother   . Stroke Mother   . Alcohol abuse Mother   . Bipolar disorder Mother   . Hypertension Sister   . Depression Sister   .  Drug abuse Sister   . Cancer Maternal Grandmother   . Diabetes Maternal Grandmother   . Heart disease Maternal Grandmother   . Hyperlipidemia Maternal Grandmother   . Hypertension Maternal Grandmother   . Kidney disease Maternal Grandmother   . Heart disease Paternal Grandfather   . Alcohol abuse Paternal Grandfather   . Drug abuse Paternal Grandfather     Social History:   Social History   Socioeconomic History  . Marital status: Legally Separated    Spouse name: Not on file  . Number of children: 2  . Years of education: Not on file  . Highest education level: GED or equivalent  Occupational History  . Not on file  Tobacco Use  . Smoking status: Current Every Day Smoker    Packs/day: 1.00    Types: Cigarettes  . Smokeless tobacco: Never Used  Substance and Sexual Activity  . Alcohol use: No    Alcohol/week: 0.0 standard drinks  . Drug use: No  . Sexual activity: Yes    Partners: Male    Birth control/protection: None, Surgical  Other Topics Concern  . Not on file  Social History Narrative  . Not on file   Social Determinants of Health   Financial Resource Strain: Low Risk   . Difficulty of Paying Living Expenses: Not hard at all  Food Insecurity: No Food Insecurity  . Worried About Programme researcher, broadcasting/film/video in the Last Year: Never true  . Ran Out of Food in the Last Year: Never true  Transportation Needs: No Transportation Needs  . Lack of Transportation (Medical): No  . Lack of Transportation (Non-Medical): No  Physical Activity: Inactive  . Days of Exercise per Week: 0 days  . Minutes of Exercise per Session: 0 min  Stress: No Stress Concern Present  . Feeling of Stress : Not at all  Social Connections: Somewhat Isolated  . Frequency of Communication with Friends and Family: More than three times a week  . Frequency of Social Gatherings with Friends and Family: Twice a week  . Attends Religious Services: More than 4 times per year  . Active Member of Clubs or  Organizations: No  . Attends Banker Meetings: Never  . Marital Status: Separated      Allergies:   Allergies  Allergen Reactions  . Robitussin (Alcohol Free) [Guaifenesin] Anaphylaxis  . Aspirin Other (See Comments)    Nose bleeds  . Tramadol     Due to Seizures per patient  . Chocolate Flavor Rash  . Codeine Rash  . Strawberry Extract Rash    Metabolic Disorder Labs: No results found for: HGBA1C, MPG No results found for: PROLACTIN No results found for: CHOL, TRIG, HDL, CHOLHDL, VLDL, LDLCALC Lab Results  Component Value Date   TSH 1.760 03/08/2015    Therapeutic Level Labs: No results found for: LITHIUM No results found for: CBMZ Lab Results  Component Value Date   VALPROATE 33 (L) 12/05/2018    Current Medications: Current Outpatient Medications  Medication Sig Dispense Refill  . albuterol (VENTOLIN HFA) 108 (90 Base) MCG/ACT inhaler Inhale 2 puffs into the lungs every 6 (six) hours as needed for wheezing or shortness of breath. 8 g 1  . benzonatate (TESSALON PERLES) 100 MG capsule Take 1 capsule (100 mg total) by mouth 3 (three) times daily as needed. 30 capsule 0  . budesonide (PULMICORT) 180 MCG/ACT inhaler Inhale 1 puff into the lungs 2 (two) times a day. 1 each 3  . divalproex (DEPAKOTE ER) 500 MG 24 hr tablet Take 1 tablet (500 mg total) by mouth at bedtime. 30 tablet 1  . famotidine (PEPCID) 20 MG tablet Take 1 tablet (20 mg total) by mouth 2 (two) times daily for 10 days. 20 tablet 0  . FLUoxetine (PROZAC) 20 MG capsule Take 1 capsule (20 mg total) by mouth daily. Take 2 tablets once daily 30 capsule 2  . fluticasone (FLONASE) 50 MCG/ACT nasal spray Place 1 spray into both nostrils 2 (two) times daily. 16 g 0  . Multiple Vitamin (MULTIVITAMIN) tablet Take 1 tablet by mouth daily.    . ondansetron (ZOFRAN-ODT) 4 MG disintegrating tablet Take 1 tablet (4 mg total) by mouth every 8 (eight) hours as needed for nausea or vomiting. 20 tablet 0  .  triamcinolone ointment (KENALOG) 0.1 % Apply 1 application topically 2 (two) times daily. 15 g 0  . vitamin B-12 (CYANOCOBALAMIN) 1000 MCG tablet Take 1,000 mcg by mouth daily.    . vitamin C (ASCORBIC ACID) 500 MG tablet Take 500 mg by mouth daily.     No current facility-administered medications for this visit.     Psychiatric Specialty Exam: Review of Systems  Psychiatric/Behavioral: Positive for sleep disturbance.  All other systems reviewed and are negative.   There were no vitals taken for this visit.There is no height or weight on file to calculate BMI.  General Appearance: Casual and Fairly Groomed  Eye Contact:  Good  Speech:  Clear and Coherent and Normal Rate  Volume:  Normal  Mood:  Mildly anxious.  Affect:  Full Range  Thought Process:  Goal Directed and Linear  Orientation:  Full (Time, Place, and Person)  Thought Content:  Logical  Suicidal Thoughts:  No  Homicidal Thoughts:  No  Memory:  Immediate;   Good Recent;   Good Remote;   Good  Judgement:  Good  Insight:  Fair  Psychomotor Activity:  Normal  Concentration:  Concentration: Good  Recall:  Good  Fund of Knowledge:Good  Language: Good  Akathisia:  Negative  Handed:  Right  AIMS (if indicated):  not done  Assets:  Communication Skills Desire for Improvement Financial Resources/Insurance Housing Resilience Talents/Skills  ADL's:  Intact  Cognition: WNL  Sleep:  Fair   Screenings: GAD-7     [facility]  Total GAD-7 Score  14  15    PHQ2-9     [facility]  PHQ-2 Total Score  5  2  5  3   2   PHQ-9 Total Score  17  18  21  14  14       Assessment and Plan: 33 yo separated white female with long hx of treatment for disorder who comes to establish regular psychiatric follow up.. She has been diagnosed and treated for this condition since age 12. She has a hx of manic episodes lasting up to 5 days which include increased energy, decreased need for sleep, racing thoughts, distractibility, increase in activity/restlessness. They have been disruptive to her daily functioning but she did not need to quit her job because of these in the past. She would have 5-6 of such episodes at the most per year. They are followed by depressive episodes which are much longer (one month or so) and were often severe enough that she felt suicidal and needed to be hospitalized. She has been in hospital 6 times: 5 as a teenager last one in her 52s. She denies having hx of suicidal attempts or psychosis. Sh does not abuse alcohol or drugs. She has been on a combination of fluoxetine and divalproex on and off for many years - it seems to work relatively well. She has been followed by Dr. Leonides Schanz at [facility] earlier but she cannot see [facility] providers now that she has a Pharmacist, community. She had a break in treatment and was restarted on both medications in May this year. Valproic acid level on 250 mg was 33 mcg/ml and dose was then doubled. In the past she was on 500 mg extended release at bedtime - it was helping with middle insomnia (she typically wakes up several times although gets total of 7-8 hrs of sleep per night).  Dx: Bipolar 1 disorder rapid cycling in remission  Plan; We will continue current medications but change dosing slightly: fluoxetine 20 mg caps instead on two 10 mg and Depakote ER 500 mg at HS instead of 250 mg bid DR. I asked Myan if she could have valproic acid level rechecked at her PCP office not earlier than in a week after change. Next appointment in one month. The plan was discussed with patient who  had an opportunity to ask questions and these were all answered. I spend 60 minutes in videoconferencing with the patient and devoted approximately 50% of this time to explanation of diagnosis, discussion of treatment options and med education.  Stephanie Acre, MD 12/31/20201:48 PM

## 2019-06-27 ENCOUNTER — Ambulatory Visit (HOSPITAL_COMMUNITY): Payer: Commercial Managed Care - PPO | Admitting: Psychiatry

## 2019-07-07 ENCOUNTER — Ambulatory Visit: Payer: Commercial Managed Care - PPO | Admitting: Psychology

## 2019-07-08 ENCOUNTER — Ambulatory Visit
Admission: EM | Admit: 2019-07-08 | Discharge: 2019-07-08 | Disposition: A | Discharge status: Home / Self Care | Payer: Commercial Managed Care - PPO | Attending: Emergency Medicine | Admitting: Emergency Medicine

## 2019-07-08 ENCOUNTER — Encounter: Payer: Self-pay | Admitting: Emergency Medicine

## 2019-07-08 ENCOUNTER — Encounter: Payer: Self-pay | Admitting: Family Medicine

## 2019-07-08 ENCOUNTER — Ambulatory Visit (INDEPENDENT_AMBULATORY_CARE_PROVIDER_SITE_OTHER): Payer: Commercial Managed Care - PPO

## 2019-07-08 ENCOUNTER — Ambulatory Visit (INDEPENDENT_AMBULATORY_CARE_PROVIDER_SITE_OTHER): Payer: Self-pay | Admitting: Family Medicine

## 2019-07-08 ENCOUNTER — Other Ambulatory Visit: Payer: Self-pay

## 2019-07-08 DIAGNOSIS — R05 Cough: Secondary | ICD-10-CM | POA: Diagnosis not present

## 2019-07-08 DIAGNOSIS — Z20822 Contact with and (suspected) exposure to covid-19: Secondary | ICD-10-CM

## 2019-07-08 DIAGNOSIS — R000 Tachycardia, unspecified: Secondary | ICD-10-CM

## 2019-07-08 DIAGNOSIS — J029 Acute pharyngitis, unspecified: Secondary | ICD-10-CM

## 2019-07-08 DIAGNOSIS — J988 Other specified respiratory disorders: Secondary | ICD-10-CM | POA: Insufficient documentation

## 2019-07-08 DIAGNOSIS — J989 Respiratory disorder, unspecified: Secondary | ICD-10-CM

## 2019-07-08 DIAGNOSIS — B9789 Other viral agents as the cause of diseases classified elsewhere: Secondary | ICD-10-CM | POA: Diagnosis not present

## 2019-07-08 LAB — GROUP A STREP BY PCR: Group A Strep by PCR: NOT DETECTED

## 2019-07-08 MED ORDER — ALUM & MAG HYDROXIDE-SIMETH 200-200-20 MG/5ML PO SUSP
30.0000 mL | Freq: Once | ORAL | Status: AC
  Administered 2019-07-08: 30 mL via ORAL

## 2019-07-08 MED ORDER — FAMOTIDINE 20 MG PO TABS: 20.0000 mg | ORAL_TABLET | Freq: Two times a day (BID) | ORAL | 0 refills | Status: DC

## 2019-07-08 MED ORDER — LIDOCAINE VISCOUS HCL 2 % MT SOLN
15.0000 mL | Freq: Once | OROMUCOSAL | Status: AC
  Administered 2019-07-08: 15 mL via ORAL

## 2019-07-08 MED ORDER — AEROCHAMBER PLUS MISC: 2 refills | Status: DC

## 2019-07-08 MED ORDER — HYDROCOD POLST-CPM POLST ER 10-8 MG/5ML PO SUER: 5.0000 mL | Freq: Two times a day (BID) | ORAL | 0 refills | Status: DC | PRN

## 2019-07-08 MED ORDER — DIPHENHYDRAMINE HCL 12.5 MG/5ML PO ELIX
25.0000 mg | ORAL_SOLUTION | Freq: Once | ORAL | Status: AC
  Administered 2019-07-08: 25 mg via ORAL

## 2019-07-08 MED ORDER — IBUPROFEN 600 MG PO TABS: 600.0000 mg | ORAL_TABLET | Freq: Four times a day (QID) | ORAL | 0 refills | Status: DC | PRN

## 2019-07-08 MED ORDER — ALBUTEROL SULFATE HFA 108 (90 BASE) MCG/ACT IN AERS: 2.0000 | INHALATION_SPRAY | RESPIRATORY_TRACT | 0 refills | Status: DC | PRN

## 2019-07-08 NOTE — ED Triage Notes (Addendum)
Patient in today with a 6 day history of cough, sore throat and chest pain from coughing. Patient states the cough has been getting progressively worse. Patient had a negative PCR covid test on 07/02/19 and a negative rapid covid test on 07/05/19.

## 2019-07-08 NOTE — ED Provider Notes (Signed)
HPI  SUBJECTIVE:  [name] is a 34 y.o. female who presents with 6 days of "being sick".  Reports headache, fatigue, cough, shortness of breath with moving around, sore throat, daily achy constant chest pain for the past 4 days and a raspy voice.  She denies fevers, body aches, loss of sense of smell or taste, nausea, vomiting, diarrhea, abdominal pain.  No sinus pain or pressure, nasal congestion, postnasal drip.  No known Covid, strep, mono exposure.  No wheezing.  No rash, sensation of throat swelling shut, difficulty breathing, drooling, trismus, neck stiffness.  No GERD or allergy symptoms.  She had a negative Covid PCR test early on the illness and had another negative rapid Covid test 2 days ago when she was seen at another urgent care.  She was started on prednisone, Tessalon.  Her chest pain is worse with exertion and lying down.  She states that this also makes the cough worse.  Symptoms are better with sitting still.  She has been taking DayQuil, Mucinex, Sudafed, Aleve, Tessalon and prednisone without improvement in her symptoms.  No alleviating factors.  She took an Aleve this morning.  No antibiotic in the past month.  She states that she is unable to sleep secondary to the cough.  She has a past medical history of GERD, takes Pepcid 20 mg once daily, chronic bronchitis, asthma, is a smoker.  No history of diabetes, hypertension, frequent strep or mono.  LMP: 12/19.  Status post bilateral tubal ligation.  PMD: None.    Past Medical History:  Diagnosis Date  . Allergy   . Asthma   . Bipolar 1 disorder (HCC)   . Bipolar disorder (HCC)   . Ectopic pregnancy   . GERD (gastroesophageal reflux disease)   . History of concussion 2016  . Hyperlipidemia   . Migraine   . MRSA (methicillin resistant Staphylococcus aureus)   . Pseudoseizures   . Scoliosis   . Scoliosis     Past Surgical History:  Procedure Laterality Date  . CARPAL TUNNEL RELEASE  2015   left  . HERNIA REPAIR   2010  . TUBAL LIGATION      Family History  Problem Relation Age of Onset  . Asthma Mother   . Heart disease Mother   . Diabetes Mother   . Hyperlipidemia Mother   . Hypertension Mother   . Mental illness Mother   . Migraines Mother   . Thyroid disease Mother   . Stroke Mother   . Alcohol abuse Mother   . Bipolar disorder Mother   . Hypertension Sister   . Depression Sister   . Drug abuse Sister   . Cancer Maternal Grandmother   . Diabetes Maternal Grandmother   . Heart disease Maternal Grandmother   . Hyperlipidemia Maternal Grandmother   . Hypertension Maternal Grandmother   . Kidney disease Maternal Grandmother   . Heart disease Paternal Grandfather   . Alcohol abuse Paternal Grandfather   . Drug abuse Paternal Grandfather     Social History   Tobacco Use  . Smoking status: Current Every Day Smoker    Packs/day: 1.00    Types: Cigarettes  . Smokeless tobacco: Never Used  Substance Use Topics  . Alcohol use: No    Alcohol/week: 0.0 standard drinks  . Drug use: No    No current facility-administered medications for this encounter.  Current Outpatient Medications:  .  Benzonatate (TESSALON PERLES PO), Take by mouth. Patient doesn't know strength. Taking 1  tablet 3 times daily prn, Disp: , Rfl:  .  divalproex (DEPAKOTE ER) 500 MG 24 hr tablet, Take 1 tablet (500 mg total) by mouth at bedtime., Disp: 30 tablet, Rfl: 1 .  FLUoxetine (PROZAC) 20 MG capsule, Take 1 capsule (20 mg total) by mouth daily. Take 1 tablet once daily, Disp: 30 capsule, Rfl: 2 .  fluticasone (FLONASE) 50 MCG/ACT nasal spray, Place 1 spray into both nostrils 2 (two) times daily., Disp: 16 g, Rfl: 0 .  PREDNISONE PO, Take by mouth. Patient doesn't know strength, but states 3 daily for 5 days, Disp: , Rfl:  .  vitamin B-12 (CYANOCOBALAMIN) 1000 MCG tablet, Take 1,000 mcg by mouth daily., Disp: , Rfl:  .  albuterol (VENTOLIN HFA) 108 (90 Base) MCG/ACT inhaler, Inhale 2 puffs into the lungs every 4  (four) hours as needed for wheezing or shortness of breath., Disp: 18 g, Rfl: 0 .  chlorpheniramine-HYDROcodone (TUSSIONEX PENNKINETIC ER) 10-8 MG/5ML SUER, Take 5 mLs by mouth every 12 (twelve) hours as needed for cough., Disp: 60 mL, Rfl: 0 .  famotidine (PEPCID) 20 MG tablet, Take 1 tablet (20 mg total) by mouth 2 (two) times daily., Disp: 60 tablet, Rfl: 0 .  ibuprofen (ADVIL) 600 MG tablet, Take 1 tablet (600 mg total) by mouth every 6 (six) hours as needed., Disp: 30 tablet, Rfl: 0 .  Spacer/Aero-Holding Chambers (AEROCHAMBER PLUS) inhaler, Use as instructed, Disp: 1 each, Rfl: 2  Allergies  Allergen Reactions  . Robitussin (Alcohol Free) [Guaifenesin] Anaphylaxis  . Aspirin Other (See Comments)    Nose bleeds  . Tramadol     Due to Seizures per patient  . Chocolate Flavor Rash  . Codeine Rash  . Strawberry Extract Rash     ROS  As noted in HPI.   Physical Exam  BP 120/88 (BP Location: Left Arm)   Pulse (!) 103   Temp 98.8 F (37.1 C) (Oral)   Resp 18   Ht 5\' 5"  (1.651 m)   Wt 69.9 kg   LMP 06/13/2019 (Exact Date) Comment: tubal ligation  SpO2 100%   BMI 25.63 kg/m   Constitutional: Well developed, well nourished, no acute distress.  Dry cough noted. Eyes:  EOMI, conjunctiva normal bilaterally HENT: Normocephalic, atraumatic,mucus membranes moist erythematous oropharynx with normal tonsils no exudates uvula midline.  No postnasal drip. Neck: Positive anterior cervical lymphadenopathy Respiratory: Normal inspiratory effort lungs clear bilaterally good air movement.  Positive lateral chest wall tenderness Cardiovascular: Normal rate regular rhythm no murmurs rubs or gallops GI: nondistended skin: No rash, skin intact Musculoskeletal: no deformities Neurologic: Alert & oriented x 3, no focal neuro deficits Psychiatric: Speech and behavior appropriate   ED Course   Medications  alum & mag hydroxide-simeth (MAALOX/MYLANTA) 200-200-20 MG/5ML suspension 30 mL (30  mLs Oral Given 07/08/19 1345)    And  lidocaine (XYLOCAINE) 2 % viscous mouth solution 15 mL (15 mLs Oral Given 07/08/19 1345)  diphenhydrAMINE (BENADRYL) 12.5 MG/5ML elixir 25 mg (25 mg Oral Given 07/08/19 1345)    Orders Placed This Encounter  Procedures  . Group A Strep by PCR    Standing Status:   Standing    Number of Occurrences:   1  . Novel Coronavirus, NAA (Hosp order, Send-out to Ref Lab; TAT 18-24 hrs    Standing Status:   Standing    Number of Occurrences:   1    Order Specific Question:   Is this test for diagnosis or screening  Answer:   Diagnosis of ill patient    Order Specific Question:   Symptomatic for COVID-19 as defined by CDC    Answer:   Yes    Order Specific Question:   Date of Symptom Onset    Answer:   07/02/2019    Order Specific Question:   Hospitalized for COVID-19    Answer:   No    Order Specific Question:   Admitted to ICU for COVID-19    Answer:   No    Order Specific Question:   Previously tested for COVID-19    Answer:   Yes    Order Specific Question:   Resident in a congregate (group) care setting    Answer:   No    Order Specific Question:   Employed in healthcare setting    Answer:   No    Order Specific Question:   Pregnant    Answer:   No  . DG Chest 2 View    Standing Status:   Standing    Number of Occurrences:   1    Order Specific Question:   Reason for Exam (SYMPTOM  OR DIAGNOSIS REQUIRED)    Answer:   cp cough r/o PNA  . ED EKG    Standing Status:   Standing    Number of Occurrences:   1    Order Specific Question:   Reason for Exam    Answer:   Chest Pain    Results for orders placed or performed during the hospital encounter of 07/08/19 (from the past 24 hour(s))  Group A Strep by PCR     Status: None   Collection Time: 07/08/19  1:03 PM   Specimen: Throat; Sterile Swab  Result Value Ref Range   Group A Strep by PCR NOT DETECTED NOT DETECTED   DG Chest 2 View  Result Date: 07/08/2019 CLINICAL DATA:  Patient in today  with a 6 day history of cough, sore throat and chest pain from coughing. Patient states the cough has been getting progressively worse. Patient had a negative PCR covid test on 07/02/19 and a negative rapid covid test on 07/05/19. EXAM: CHEST - 2 VIEW COMPARISON:  04/01/2019 FINDINGS: Normal heart, mediastinum and hila. Clear lungs. No pleural effusion or pneumothorax. Skeletal structures are unremarkable. IMPRESSION: Normal chest radiographs. Electronically Signed   By: Amie Portland M.D.   On: 07/08/2019 14:10    ED Clinical Impression  1. Viral respiratory illness   2. Pharyngitis, unspecified etiology   3. Encounter for laboratory testing for COVID-19 virus      ED Assessment/Plan  EKG: Normal sinus rhythm, rate 70.  Normal axis normal intervals.  No hypertrophy.  No ST-T wave changes.  No change compared to EKG from 03/2019  Strep PCR negative.  Will give Benadryl/Maalox/viscous lidocaine if this does not dampen the cough and help with her sore throat  [place] Narcotic database reviewed for this patient, and feel that the risk/benefit ratio today is favorable for proceeding with a prescription for controlled substance.  Reviewed imaging independently.  Normal chest x-ray.  See radiology report for full details.  Patient with a viral respiratory illness.  Sore throat could be from coughing versus acid reflux.  No evidence of pericarditis, pneumonia with normal EKG and normal chest x-ray with clear lungs.   Doubt mono.  Her strep is negative.  We will have her continue the prednisone, Tessalon.  Will send home also with an albuterol inhaler with a spacer  due to history of asthma.  She is to do this on a regular basis for the next 4 days, Tussionex, ibuprofen 600 mg combined with 1 g of Tylenol 3-4 times a day as needed for pain.  Benadryl/Maalox mixture for the sore throat.  Will increase her Pepcid to twice a day.  Providing primary care list for ongoing care.  To the ER if she gets worse.  2-day  work note.  Discussed labs, imaging, MDM, treatment plan, and plan for follow-up with patient. Discussed sn/sx that should prompt return to the ED. patient agrees with plan.   Meds ordered this encounter  Medications  . AND Linked Order Group   . alum & mag hydroxide-simeth (MAALOX/MYLANTA) 200-200-20 MG/5ML suspension 30 mL   . lidocaine (XYLOCAINE) 2 % viscous mouth solution 15 mL  . diphenhydrAMINE (BENADRYL) 12.5 MG/5ML elixir 25 mg  . albuterol (VENTOLIN HFA) 108 (90 Base) MCG/ACT inhaler    Sig: Inhale 2 puffs into the lungs every 4 (four) hours as needed for wheezing or shortness of breath.    Dispense:  18 g    Refill:  0  . Spacer/Aero-Holding Chambers (AEROCHAMBER PLUS) inhaler    Sig: Use as instructed    Dispense:  1 each    Refill:  2  . famotidine (PEPCID) 20 MG tablet    Sig: Take 1 tablet (20 mg total) by mouth 2 (two) times daily.    Dispense:  60 tablet    Refill:  0  . chlorpheniramine-HYDROcodone (TUSSIONEX PENNKINETIC ER) 10-8 MG/5ML SUER    Sig: Take 5 mLs by mouth every 12 (twelve) hours as needed for cough.    Dispense:  60 mL    Refill:  0  . ibuprofen (ADVIL) 600 MG tablet    Sig: Take 1 tablet (600 mg total) by mouth every 6 (six) hours as needed.    Dispense:  30 tablet    Refill:  0    *This clinic note was created using Scientist, clinical (histocompatibility and immunogenetics). Therefore, there may be occasional mistakes despite careful proofreading.   ?    Domenick Gong, MD 07/09/19 [phone]

## 2019-07-08 NOTE — Progress Notes (Signed)
Name: [name]   MRN: [id number]    DOB: January 28, 1986   Date:07/08/2019       Progress Note  Subjective  Chief Complaint  Chief Complaint  Patient presents with  . Cough    chest discomfort for 1 week  . Sore Throat    I connected with  [name] on 07/08/19 at 11:00 AM EST by telephone and verified that I am speaking with the correct person using two identifiers.   I discussed the limitations, risks, security and privacy concerns of performing an evaluation and management service by telephone and the availability of in person appointments. Staff also discussed with the patient that there may be a patient responsible charge related to this service. Patient Location: Home Provider Location: Office Additional Individuals present: None  HPI  Pt presents with concern for 1 week of respiratory illness.  Had negative COVID-19 test 07/02/2019 with work and a rapid at [employer] on 07/05/2019 - both were negative per her report.  Symptoms started as congested constant cough, headache, raw sore throat, shortness of breath with cough, chest discomfort.  Denies fevers/chills, lightheadedness or dizziness.  Has been able to eat and drink some, though it is painful.  Checked Pulse Ox at home yesterday - HR was 130-140 with activity, about 100-120 at rest and O2 was 97%.   She has been taking Aleve without relief of palpitations.  Has tried dayquil, nyquil, and mucinex without relief.  Patient Active Problem List   Diagnosis Date Noted  . Need for diphtheria-tetanus-pertussis (Tdap) vaccine, adult/adolescent 04/05/2015  . Abnormal weight gain 03/12/2015  . Encounter for medication monitoring 03/12/2015  . Abdominal pain, acute, right lower quadrant 03/02/2015  . Bipolar affective disorder ([place]) 01/26/2015  . Convulsion ([place]) 01/25/2015  . Seizures ([place]) 01/12/2015  . Asthma   . Hyperlipidemia   . Bipolar disorder ([place])   . Allergy   . MRSA (methicillin resistant Staphylococcus  aureus)   . Migraine headache with aura     Social History   Tobacco Use  . Smoking status: Current Every Day Smoker    Packs/day: 1.00    Types: Cigarettes  . Smokeless tobacco: Never Used  Substance Use Topics  . Alcohol use: No    Alcohol/week: 0.0 standard drinks     Current Outpatient Medications:  .  albuterol (VENTOLIN HFA) 108 (90 Base) MCG/ACT inhaler, Inhale 2 puffs into the lungs every 6 (six) hours as needed for wheezing or shortness of breath., Disp: 8 g, Rfl: 1 .  divalproex (DEPAKOTE ER) 500 MG 24 hr tablet, Take 1 tablet (500 mg total) by mouth at bedtime., Disp: 30 tablet, Rfl: 1 .  FLUoxetine (PROZAC) 20 MG capsule, Take 1 capsule (20 mg total) by mouth daily. Take 1 tablet once daily, Disp: 30 capsule, Rfl: 2 .  fluticasone (FLONASE) 50 MCG/ACT nasal spray, Place 1 spray into both nostrils 2 (two) times daily., Disp: 16 g, Rfl: 0 .  vitamin B-12 (CYANOCOBALAMIN) 1000 MCG tablet, Take 1,000 mcg by mouth daily., Disp: , Rfl:  .  famotidine (PEPCID) 20 MG tablet, Take 1 tablet (20 mg total) by mouth 2 (two) times daily for 10 days., Disp: 20 tablet, Rfl: 0  Allergies  Allergen Reactions  . Robitussin (Alcohol Free) [Guaifenesin] Anaphylaxis  . Aspirin Other (See Comments)    Nose bleeds  . Tramadol     Due to Seizures per patient  . Chocolate Flavor Rash  . Codeine Rash  . Strawberry Extract  Rash    I personally reviewed active problem list, medication list, allergies, notes from last encounter, lab results with the patient/caregiver today.  ROS  Ten systems reviewed and is negative except as mentioned in HPI  Objective  Virtual encounter, vitals not obtained.  There is no height or weight on file to calculate BMI.  Nursing Note and Vital Signs reviewed.  Physical Exam  HENT: Voice quality is hoarse; congested cough is persistent throughout visit. Pulmonary/Chest: Effort normal. No respiratory distress. Speaking in complete  sentences Neurological: Pt is alert and oriented to person, place, and time. Coordination, speech and gait are normal.  Psychiatric: Patient has a normal mood and affect. behavior is normal. Judgment and thought content normal.  No results found for this or any previous visit (from the past 72 hour(s)).  Assessment & Plan  1. Respiratory illness 2. Tachycardia - Advised I am concerned about her reported tachycardia in the 130-140's range with activity, and that I recommend UC for in person evaluation and possibly IV hydration.  Explained that unfortunately, we are unable to evaluate in office due to her present respiratory illness.  She is agreeable to this and will have family member drive her to [facility].   - I discussed the assessment and treatment plan with the patient. The patient was provided an opportunity to ask questions and all were answered. The patient agreed with the plan and demonstrated an understanding of the instructions.  - The patient was advised to call back or seek an in-person evaluation if the symptoms worsen or if the condition fails to improve as anticipated.  I provided 14 minutes of non-face-to-face time during this encounter.  Danielle Custard, FNP

## 2019-07-08 NOTE — Discharge Instructions (Addendum)
Increase Pepcid to twice a day.  She fasting albuterol inhaler every 4 hours for 2 days, then every 6 hours for 2 days, then as needed.  Make sure you use your spacer with your albuterol inhaler.  Tessalon for the cough during the day, Tussionex for the cough at night.  1 gram of Tylenol and 600 mg ibuprofen together 3-4 times a day as needed for pain.  Make sure you drink plenty of extra fluids.  Some people find salt water gargles and  Traditional Medicinal's "Throat Coat" tea helpful. Take 5 mL of liquid Benadryl and 5 mL of Maalox. Mix it together, and then hold it in your mouth for as long as you can and then swallow. You may do this 4 times a day.    Go to www.goodrx.com to look up your medications. This will give you a list of where you can find your prescriptions at the most affordable prices. Or ask the pharmacist what the cash price is, or if they have any other discount programs available to help make your medication more affordable. This can be less expensive than what you would pay with insurance.

## 2019-07-09 LAB — NOVEL CORONAVIRUS, NAA (HOSP ORDER, SEND-OUT TO REF LAB; TAT 18-24 HRS): SARS-CoV-2, NAA: NOT DETECTED

## 2019-07-21 ENCOUNTER — Ambulatory Visit (INDEPENDENT_AMBULATORY_CARE_PROVIDER_SITE_OTHER): Payer: Commercial Managed Care - PPO | Admitting: Psychology

## 2019-07-21 DIAGNOSIS — F3130 Bipolar disorder, current episode depressed, mild or moderate severity, unspecified: Secondary | ICD-10-CM

## 2019-07-30 ENCOUNTER — Ambulatory Visit (INDEPENDENT_AMBULATORY_CARE_PROVIDER_SITE_OTHER): Payer: Commercial Managed Care - PPO | Admitting: Psychiatry

## 2019-07-30 ENCOUNTER — Other Ambulatory Visit: Payer: Self-pay

## 2019-07-30 DIAGNOSIS — F3181 Bipolar II disorder: Secondary | ICD-10-CM

## 2019-07-30 MED ORDER — TRAZODONE HCL 100 MG PO TABS: 100.0000 mg | ORAL_TABLET | Freq: Every evening | ORAL | 1 refills | Status: DC | PRN

## 2019-07-30 MED ORDER — DIVALPROEX SODIUM ER 500 MG PO TB24: 500.0000 mg | ORAL_TABLET | Freq: Every day | ORAL | 1 refills | Status: DC

## 2019-07-30 NOTE — Progress Notes (Signed)
BH MD/PA/NP OP Progress Note  07/30/2019 4:40 PM BRIANNE MAINA  MRN:  [id number] Interview was conducted by phone and I verified that I was speaking with the correct person using two identifiers. I discussed the limitations of evaluation and management by telemedicine and  the availability of in person appointments. Patient expressed understanding and agreed to proceed.  Chief Complaint: Insomnia.  HPI: 34 yo separated white female with long hx of treatment for disorder who comes to establish regular psychiatric follow up.. She has been diagnosed and treated for this condition since age 1. She has a hx of manic episodes lasting few (not longer than 5) days which include increased energy, decreased need for sleep, racing thoughts, distractibility, increase in activity/restlessness. They have been disruptive to her daily functioning but she did not need to quit her job because of these in the past. She would have 5-6 of such episodes at the most per year. They are followed by depressive episodes which are much longer (one month or so) and were often severe enough that she felt suicidal and needed to be hospitalized. She has been in hospital 6 times: 5 as a teenager last one in her 65s. She denies having hx of suicidal attempts or psychosis. Sh does not abuse alcohol or drugs. She has been on a combination of fluoxetine and divalproex on and off for many years - it seems to work relatively well. She has been followed by 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 She had a break in treatment and was restarted on both medications in May 2020. Valproic acid level on 250 mg was 33 mcg/ml and dose was then doubled. In the past she was on 500 mg extended release at bedtime - it was helping with middle insomnia so we changed it to this dose/form. Her sleep did not improve much however. She sleeps anywhere from 3 to 5 hours at a time and feels tired in the  morning. She tried Seroquel (excessively sedated) and zolpidem (did not help and actually appeared to be activating) in the past.  Visit Diagnosis:    ICD-10-CM   1. Bipolar 2 disorder (HCC)  F31.81     Past Psychiatric History: Please see intake H&P.  Past Medical History:  Past Medical History:  Diagnosis Date  . Allergy   . Asthma   . Bipolar 1 disorder (HCC)   . Bipolar disorder (HCC)   . Ectopic pregnancy   . GERD (gastroesophageal reflux disease)   . History of concussion 2016  . Hyperlipidemia   . Migraine   . MRSA (methicillin resistant Staphylococcus aureus)   . Pseudoseizures   . Scoliosis   . Scoliosis     Past Surgical History:  Procedure Laterality Date  . CARPAL TUNNEL RELEASE  2015   left  . HERNIA REPAIR  2010  . TUBAL LIGATION      Family Psychiatric History: Reviewed.  Family History:  Family History  Problem Relation Age of Onset  . Asthma Mother   . Heart disease Mother   . Diabetes Mother   . Hyperlipidemia Mother   . Hypertension Mother   . Mental illness Mother   . Migraines Mother   . Thyroid disease Mother   . Stroke Mother   . Alcohol abuse Mother   . Bipolar disorder Mother   . Hypertension Sister   . Depression Sister   . Drug abuse Sister   .  Cancer Maternal Grandmother   . Diabetes Maternal Grandmother   . Heart disease Maternal Grandmother   . Hyperlipidemia Maternal Grandmother   . Hypertension Maternal Grandmother   . Kidney disease Maternal Grandmother   . Heart disease Paternal Grandfather   . Alcohol abuse Paternal Grandfather   . Drug abuse Paternal Grandfather     Social History:  Social History   Socioeconomic History  . Marital status: Legally Separated    Spouse name: Not on file  . Number of children: 2  . Years of education: Not on file  . Highest education level: GED or equivalent  Occupational History  . Not on file  Tobacco Use  . Smoking status: Current Every Day Smoker    Packs/day: 1.00     Types: Cigarettes  . Smokeless tobacco: Never Used  Substance and Sexual Activity  . Alcohol use: No    Alcohol/week: 0.0 standard drinks  . Drug use: No  . Sexual activity: Yes    Partners: Male    Birth control/protection: None, Surgical  Other Topics Concern  . Not on file  Social History Narrative  . Not on file   Social Determinants of Health   Financial Resource Strain: Low Risk   . Difficulty of Paying Living Expenses: Not hard at all  Food Insecurity: No Food Insecurity  . Worried About Programme researcher, broadcasting/film/video in the Last Year: Never true  . Ran Out of Food in the Last Year: Never true  Transportation Needs: No Transportation Needs  . Lack of Transportation (Medical): No  . Lack of Transportation (Non-Medical): No  Physical Activity: Inactive  . Days of Exercise per Week: 0 days  . Minutes of Exercise per Session: 0 min  Stress: No Stress Concern Present  . Feeling of Stress : Not at all  Social Connections: Somewhat Isolated  . Frequency of Communication with Friends and Family: More than three times a week  . Frequency of Social Gatherings with Friends and Family: Twice a week  . Attends Religious Services: More than 4 times per year  . Active Member of Clubs or Organizations: No  . Attends Banker Meetings: Never  . Marital Status: Separated    Allergies:  Allergies  Allergen Reactions  . Robitussin (Alcohol Free) [Guaifenesin] Anaphylaxis  . Aspirin Other (See Comments)    Nose bleeds  . Tramadol     Due to Seizures per patient  . Chocolate Flavor Rash  . Codeine Rash  . Strawberry Extract Rash    Metabolic Disorder Labs: No results found for: HGBA1C, MPG No results found for: PROLACTIN No results found for: CHOL, TRIG, HDL, CHOLHDL, VLDL, LDLCALC Lab Results  Component Value Date   TSH 1.760 03/08/2015    Therapeutic Level Labs: No results found for: LITHIUM Lab Results  Component Value Date   VALPROATE 33 (L) 12/05/2018    VALPROATE 25 (L) 03/08/2015   No components found for:  CBMZ  Current Medications: Current Outpatient Medications  Medication Sig Dispense Refill  . albuterol (VENTOLIN HFA) 108 (90 Base) MCG/ACT inhaler Inhale 2 puffs into the lungs every 4 (four) hours as needed for wheezing or shortness of breath. 18 g 0  . Benzonatate (TESSALON PERLES PO) Take by mouth. Patient doesn't know strength. Taking 1 tablet 3 times daily prn    . chlorpheniramine-HYDROcodone (TUSSIONEX PENNKINETIC ER) 10-8 MG/5ML SUER Take 5 mLs by mouth every 12 (twelve) hours as needed for cough. 60 mL 0  . [START ON  08/24/2019] divalproex (DEPAKOTE ER) 500 MG 24 hr tablet Take 1 tablet (500 mg total) by mouth at bedtime. 30 tablet 1  . famotidine (PEPCID) 20 MG tablet Take 1 tablet (20 mg total) by mouth 2 (two) times daily. 60 tablet 0  . FLUoxetine (PROZAC) 20 MG capsule Take 1 capsule (20 mg total) by mouth daily. Take 1 tablet once daily 30 capsule 2  . fluticasone (FLONASE) 50 MCG/ACT nasal spray Place 1 spray into both nostrils 2 (two) times daily. 16 g 0  . ibuprofen (ADVIL) 600 MG tablet Take 1 tablet (600 mg total) by mouth every 6 (six) hours as needed. 30 tablet 0  . PREDNISONE PO Take by mouth. Patient doesn't know strength, but states 3 daily for 5 days    . Spacer/Aero-Holding Chambers (AEROCHAMBER PLUS) inhaler Use as instructed 1 each 2  . traZODone (DESYREL) 100 MG tablet Take 1 tablet (100 mg total) by mouth at bedtime as needed for sleep. 30 tablet 1  . vitamin B-12 (CYANOCOBALAMIN) 1000 MCG tablet Take 1,000 mcg by mouth daily.     No current facility-administered medications for this visit.     Psychiatric Specialty Exam: Review of Systems  Psychiatric/Behavioral: Positive for sleep disturbance.  All other systems reviewed and are negative.   There were no vitals taken for this visit.There is no height or weight on file to calculate BMI.  General Appearance: NA  Eye Contact:  NA  Speech:  Clear and  Coherent and Normal Rate  Volume:  Normal  Mood:  Euthymic  Affect:  NA  Thought Process:  Goal Directed and Linear  Orientation:  Full (Time, Place, and Person)  Thought Content: Logical   Suicidal Thoughts:  No  Homicidal Thoughts:  No  Memory:  Immediate;   Good Recent;   Good Remote;   Good  Judgement:  Good  Insight:  Good  Psychomotor Activity:  NA  Concentration:  Concentration: Good  Recall:  Good  Fund of Knowledge: Good  Language: Good  Akathisia:  Negative  Handed:  Right  AIMS (if indicated): not done  Assets:  Communication Skills Desire for Improvement Financial Resources/Insurance Housing Talents/Skills  ADL's:  Intact  Cognition: WNL  Sleep:  Poor   Screenings: GAD-7     Office Visit from 11/14/2018 in [facility] Office Visit from 10/09/2018 in [facility]  Total GAD-7 Score  14  15    PHQ2-9     Office Visit from 07/08/2019 in [facility] Office Visit from 12/17/2018 in [facility] Office Visit from 12/08/2018 in [facility] Office Visit from 11/18/2018 in [facility] Office Visit from 11/14/2018 in [facility]  PHQ-2 Total Score  0  5  2  5  3   PHQ-9 Total Score  0  17  18  21  14        Assessment and Plan: 34 yo separated white female with long hx of treatment for disorder who comes to establish regular psychiatric follow up.. She has been diagnosed and treated for this condition since age 26. She has a hx of manic episodes lasting few (not longer than 5) days which include increased energy, decreased need for sleep, racing thoughts, distractibility, increase in activity/restlessness. They have been disruptive to her daily functioning but she did not need to quit her job because of these in the past. She would have 5-6 of such episodes at the most  per year. They are followed by depressive episodes which are much longer  (one month or so) and were often severe enough that she felt suicidal and needed to be hospitalized. She has been in hospital 6 times: 5 as a teenager last one in her 60s. She denies having hx of suicidal attempts or psychosis. Sh does not abuse alcohol or drugs. She has been on a combination of fluoxetine and divalproex on and off for many years - it seems to work relatively well. She has been followed by Dr. Elesa Massed at [facility] earlier but she cannot see [facility] providers now that she has a Nurse, learning disability. She had a break in treatment and was restarted on both medications in May 2020. Valproic acid level on 250 mg was 33 mcg/ml and dose was then doubled. In the past she was on 500 mg extended release at bedtime - it was helping with middle insomnia so we changed it to this dose/form. Her sleep did not improve much however. She sleeps anywhere from 3 to 5 hours at a time and feels tired in the morning. She tried Seroquel (excessively sedated) and zolpidem (did not help and actually appeared to be activating) in the past.  Dx: Bipolar 2 disorder rapid cycling in remission  Plan Continue fluoxetine 20 mg daily and Depakote ER 500 mg at HS. We will add trazodone for insomnia - she will try 50-150 mg range. Next appointment in two months. We will need to recheck valproic acid level soon. The plan was discussed with patient who had an opportunity to ask questions and these were all answered. I spend 25 minutes in phone consultation with the patient.     Magdalene Patricia, MD 07/30/2019, 4:40 PM

## 2019-08-04 ENCOUNTER — Emergency Department: Payer: Commercial Managed Care - PPO

## 2019-08-04 ENCOUNTER — Emergency Department
Admission: EM | Admit: 2019-08-04 | Discharge: 2019-08-04 | Disposition: A | Discharge status: Home / Self Care | Payer: Commercial Managed Care - PPO | Attending: Emergency Medicine | Admitting: Emergency Medicine

## 2019-08-04 ENCOUNTER — Ambulatory Visit (INDEPENDENT_AMBULATORY_CARE_PROVIDER_SITE_OTHER): Payer: Commercial Managed Care - PPO | Admitting: Psychology

## 2019-08-04 ENCOUNTER — Other Ambulatory Visit: Payer: Self-pay

## 2019-08-04 DIAGNOSIS — R6884 Jaw pain: Secondary | ICD-10-CM | POA: Diagnosis present

## 2019-08-04 DIAGNOSIS — F3130 Bipolar disorder, current episode depressed, mild or moderate severity, unspecified: Secondary | ICD-10-CM

## 2019-08-04 DIAGNOSIS — J45909 Unspecified asthma, uncomplicated: Secondary | ICD-10-CM | POA: Diagnosis not present

## 2019-08-04 DIAGNOSIS — F17210 Nicotine dependence, cigarettes, uncomplicated: Secondary | ICD-10-CM | POA: Insufficient documentation

## 2019-08-04 DIAGNOSIS — K029 Dental caries, unspecified: Secondary | ICD-10-CM | POA: Diagnosis not present

## 2019-08-04 LAB — BASIC METABOLIC PANEL
Anion gap: 6 (ref 5–15)
BUN: 12 mg/dL (ref 6–20)
CO2: 25 mmol/L (ref 22–32)
Calcium: 8.9 mg/dL (ref 8.9–10.3)
Chloride: 107 mmol/L (ref 98–111)
Creatinine, Ser: 0.56 mg/dL (ref 0.44–1.00)
GFR calc Af Amer: >60 mL/min (ref >60)
GFR calc non Af Amer: >60 mL/min (ref >60)
Glucose, Bld: 93 mg/dL (ref 70–99)
Potassium: 4.3 mmol/L (ref 3.5–5.1)
Sodium: 138 mmol/L (ref 135–145)

## 2019-08-04 LAB — CBC
HCT: 37.1 % (ref 36.0–46.0)
Hemoglobin: 12.5 g/dL (ref 12.0–15.0)
MCH: 30.7 pg (ref 26.0–34.0)
MCHC: 33.7 g/dL (ref 30.0–36.0)
MCV: 91.2 fL (ref 80.0–100.0)
Platelets: 246 10*3/uL (ref 150–400)
RBC: 4.07 MIL/uL (ref 3.87–5.11)
RDW: 12.7 % (ref 11.5–15.5)
WBC: 8.1 10*3/uL (ref 4.0–10.5)
nRBC: 0 % (ref 0.0–0.2)

## 2019-08-04 MED ORDER — AMOXICILLIN 500 MG PO CAPS: 500.0000 mg | ORAL_CAPSULE | Freq: Three times a day (TID) | ORAL | 0 refills | Status: DC

## 2019-08-04 MED ORDER — AMOXICILLIN 500 MG PO CAPS
500.0000 mg | ORAL_CAPSULE | Freq: Once | ORAL | Status: AC
  Administered 2019-08-04: 500 mg via ORAL
  Filled 2019-08-04: qty 1

## 2019-08-04 MED ORDER — IOHEXOL 300 MG/ML  SOLN
75.0000 mL | Freq: Once | INTRAMUSCULAR | Status: AC | PRN
  Administered 2019-08-04: 75 mL via INTRAVENOUS
  Filled 2019-08-04: qty 75

## 2019-08-04 MED ORDER — IBUPROFEN 600 MG PO TABS
600.0000 mg | ORAL_TABLET | Freq: Once | ORAL | Status: AC
  Administered 2019-08-04: 600 mg via ORAL
  Filled 2019-08-04: qty 1

## 2019-08-04 NOTE — Discharge Instructions (Signed)
OPTIONS FOR DENTAL FOLLOW UP CARE ° °[place] Department of Health and Human Services - Local Safety Net Dental Clinics °http://www.ncdhhs.gov/dph/oralhealth/services/safetynetclinics.htm °  °[facility] (336-562-3123) ° °[facility] [place] (919-933-9087) ° °[facility] ([phone] ext [phone]) ° °[facility] ([phone]) ° °[facility] ([phone]) °This clinic caters to the indigent population and is on a lottery system. °Location: °[facility], [address], [place] °Clinic Hours: °Wednesdays from 6pm - 9pm, patients seen by a lottery system. °For dates, call or go to www.med.[facility].edu/[facility]/patients/Dental-[facility] °Services: °Cleanings, fillings and simple extractions. °Payment Options: °DENTAL WORK IS FREE OF CHARGE. Bring proof of income or support. °Best way to get seen: °Arrive at 5:15 pm - this is a lottery, NOT first come/first serve, so arriving earlier will not increase your chances of being seen. °  °  °[facility] °[phone] °Select option 1 for emergencies °  °Location: °[facility], [address], [place] °Clinic Hours: °No walk-ins accepted - call the day before to schedule an appointment. °Check in times are 9:30 am and 1:30 pm. °Services: °Simple extractions, temporary fillings, pulpectomy/pulp debridement, uncomplicated abscess drainage. °Payment Options: °PAYMENT IS DUE AT THE TIME OF SERVICE.  Fee is usually $100-200, additional surgical procedures (e.g. abscess drainage) may be extra. °Cash, checks, Visa/MasterCard accepted.  Can file Medicaid if patient is covered for dental - patient should call case worker to check. °No discount for [facility] patients. °Best way to get seen: °MUST call the day before and get onto the schedule. Can usually be seen the next 1-2 days. No walk-ins accepted. °  °  °[facility] °[phone] °   °Location: °[facility], [address], [place] °Clinic Hours: °M, W, Th, F 8am or 1:30pm, Tues 9a or 1:30 - first come/first served. °Services: °Simple extractions, temporary fillings, uncomplicated abscess drainage.  You do not need to be an [place] resident. °Payment Options: °PAYMENT IS DUE AT THE TIME OF SERVICE. °Dental insurance, otherwise sliding scale - bring proof of income or support. °Depending on income and treatment needed, cost is usually $50-200. °Best way to get seen: °Arrive early as it is first come/first served. °  °  °[facility] °[phone] °  °Location: °[address] °Clinic Hours: °Mon-Thu 8a-5p °Services: °Most basic dental services including extractions and fillings. °Payment Options: °PAYMENT IS DUE AT THE TIME OF SERVICE. °Sliding scale, up to 50% off - bring proof if income or support. °Medicaid with dental option accepted. °Best way to get seen: °Call to schedule an appointment, can usually be seen within 2 weeks OR they will try to see walk-ins - show up at 8a or 2p (you may have to wait). °  °  °[facility] °919-245-2435 °[place] RESIDENTS ONLY °  °Location: °[facility], [address], [place], [place] [address] °Clinic Hours: By appointment only. °Monday - Thursday 8am-5pm, Friday 8am-12pm °Services: Cleanings, fillings, extractions. °Payment Options: °PAYMENT IS DUE AT THE TIME OF SERVICE. °Cash, Visa or MasterCard. Sliding scale - $30 minimum per service. °Best way to get seen: °Come in to office, complete packet and make an appointment - need proof of income °or support monies for each household member and proof of [place] residence. °Usually takes about a month to get in. °  °  °[facility] °[phone] °  °Location: °[address]   [place] °Clinic Hours: Walk-in Urgent Care Dental Services are offered Monday-Friday  mornings only. °The numbers of emergencies accepted daily is limited to the number of °providers available. °Maximum 15 - Mondays, Wednesdays & Thursdays °Maximum 10 - Tuesdays & Fridays °Services: °You do not need to be a  [place] resident to be seen for a dental emergency. °Emergencies are defined as pain, swelling, abnormal bleeding, or dental trauma. Walkins will receive x-rays if needed. °NOTE: Dental cleaning is not an emergency. °Payment Options: °PAYMENT IS DUE AT THE TIME OF SERVICE. °Minimum co-pay is $40.00 for uninsured patients. °Minimum co-pay is $3.00 for Medicaid with dental coverage. °Dental Insurance is accepted and must be presented at time of visit. °Medicare does not cover dental. °Forms of payment: Cash, credit card, checks. °Best way to get seen: °If not previously registered with the clinic, walk-in dental registration begins at 7:15 am and is on a first come/first serve basis. °If previously registered with the clinic, call to make an appointment. °  °  °[facility] °[phone] °[place] RESIDENTS ONLY °  °Location: °[address], [place], [place] °Clinic Hours: °Mon-Thu 10a-2p °Services: Extractions only! °Payment Options: °FREE (donations accepted) - bring proof of income or support °Best way to get seen: °Call and schedule an appointment OR come at 8am on the 1st Monday of every month (except for holidays) when it is first come/first served. °  °  °[facility] °[phone] °  °Location: °[address], [place] °Clinic Hours: °Friday mornings °Services, Payment Options, Best way to get seen: °Call for info °

## 2019-08-04 NOTE — ED Provider Notes (Signed)
[facility] Emergency Department Provider Note  ____________________________________________  Time seen: Approximately 9:37 PM  I have reviewed the triage vital signs and the nursing notes.   HISTORY  Chief Complaint No chief complaint on file.    HPI [name] is a 34 y.o. female that presents to the emergency department for evaluation of right jaw pain for 3 days.  Patient states that she woke up 3 days ago and right jaw was painful.  It resolved with ibuprofen.  She woke up yesterday and jaw was painful and it resolved again with ibuprofen.  Today it is more painful than it has been the previous 2 days.  Patient states that she has poor dentition but does not have any dental pain.  No fevers.  No swelling.  No recent illness.   Past Medical History:  Diagnosis Date  . Allergy   . Asthma   . Bipolar 1 disorder (HCC)   . Bipolar disorder (HCC)   . Ectopic pregnancy   . GERD (gastroesophageal reflux disease)   . History of concussion 2016  . Hyperlipidemia   . Migraine   . MRSA (methicillin resistant Staphylococcus aureus)   . Pseudoseizures   . Scoliosis   . Scoliosis     Patient Active Problem List   Diagnosis Date Noted  . Need for diphtheria-tetanus-pertussis (Tdap) vaccine, adult/adolescent 04/05/2015  . Abnormal weight gain 03/12/2015  . Encounter for medication monitoring 03/12/2015  . Abdominal pain, acute, right lower quadrant 03/02/2015  . Bipolar affective disorder (HCC) 01/26/2015  . Convulsion (HCC) 01/25/2015  . Seizures (HCC) 01/12/2015  . Asthma   . Hyperlipidemia   . Bipolar 2 disorder (HCC)   . Allergy   . MRSA (methicillin resistant Staphylococcus aureus)   . Migraine headache with aura     Past Surgical History:  Procedure Laterality Date  . CARPAL TUNNEL RELEASE  2015   left  . HERNIA REPAIR  2010  . TUBAL LIGATION      Prior to Admission medications   Medication Sig Start Date End Date Taking?  Authorizing Provider  albuterol (VENTOLIN HFA) 108 (90 Base) MCG/ACT inhaler Inhale 2 puffs into the lungs every 4 (four) hours as needed for wheezing or shortness of breath. 07/08/19   Domenick Gong, MD  amoxicillin (AMOXIL) 500 MG capsule Take 1 capsule (500 mg total) by mouth 3 (three) times daily. 08/04/19   Enid Derry, PA-C  Benzonatate (TESSALON PERLES PO) Take by mouth. Patient doesn't know strength. Taking 1 tablet 3 times daily prn    [provider]  chlorpheniramine-HYDROcodone (TUSSIONEX PENNKINETIC ER) 10-8 MG/5ML SUER Take 5 mLs by mouth every 12 (twelve) hours as needed for cough. 07/08/19   Domenick Gong, MD  divalproex (DEPAKOTE ER) 500 MG 24 hr tablet Take 1 tablet (500 mg total) by mouth at bedtime. 08/24/19 10/23/19  Pucilowski, Roosvelt Maser, MD  famotidine (PEPCID) 20 MG tablet Take 1 tablet (20 mg total) by mouth 2 (two) times daily. 07/08/19 08/07/19  Domenick Gong, MD  FLUoxetine (PROZAC) 20 MG capsule Take 1 capsule (20 mg total) by mouth daily. Take 1 tablet once daily 06/25/19 09/23/19  Pucilowski, Roosvelt Maser, MD  fluticasone (FLONASE) 50 MCG/ACT nasal spray Place 1 spray into both nostrils 2 (two) times daily. 09/07/18   Cuthriell, Delorise Royals, PA-C  ibuprofen (ADVIL) 600 MG tablet Take 1 tablet (600 mg total) by mouth every 6 (six) hours as needed. 07/08/19   Domenick Gong, MD  PREDNISONE PO Take  by mouth. Patient doesn't know strength, but states 3 daily for 5 days    [provider]  Spacer/Aero-Holding Chambers (AEROCHAMBER PLUS) inhaler Use as instructed 07/08/19   Domenick Gong, MD  traZODone (DESYREL) 100 MG tablet Take 1 tablet (100 mg total) by mouth at bedtime as needed for sleep. 07/30/19 09/28/19  Pucilowski, Roosvelt Maser, MD  vitamin B-12 (CYANOCOBALAMIN) 1000 MCG tablet Take 1,000 mcg by mouth daily.    [provider]    Allergies Robitussin (alcohol free) [guaifenesin], Aspirin, Tramadol, Chocolate flavor, Codeine, and Strawberry  extract  Family History  Problem Relation Age of Onset  . Asthma Mother   . Heart disease Mother   . Diabetes Mother   . Hyperlipidemia Mother   . Hypertension Mother   . Mental illness Mother   . Migraines Mother   . Thyroid disease Mother   . Stroke Mother   . Alcohol abuse Mother   . Bipolar disorder Mother   . Hypertension Sister   . Depression Sister   . Drug abuse Sister   . Cancer Maternal Grandmother   . Diabetes Maternal Grandmother   . Heart disease Maternal Grandmother   . Hyperlipidemia Maternal Grandmother   . Hypertension Maternal Grandmother   . Kidney disease Maternal Grandmother   . Heart disease Paternal Grandfather   . Alcohol abuse Paternal Grandfather   . Drug abuse Paternal Grandfather     Social History Social History   Tobacco Use  . Smoking status: Current Every Day Smoker    Packs/day: 1.00    Types: Cigarettes  . Smokeless tobacco: Never Used  Substance Use Topics  . Alcohol use: No    Alcohol/week: 0.0 standard drinks  . Drug use: No     Review of Systems  Constitutional: No fever/chills ENT: No upper respiratory complaints. Cardiovascular: No chest pain. Respiratory: No SOB. Gastrointestinal: No abdominal pain.  No nausea, no vomiting.  Musculoskeletal: Negative for musculoskeletal pain. Skin: Negative for rash, abrasions, lacerations, ecchymosis. Neurological: Negative for headaches, numbness or tingling   ____________________________________________   PHYSICAL EXAM:  VITAL SIGNS: ED Triage Vitals [08/04/19 2042]  Enc Vitals Group     BP 113/65     Pulse Rate 91     Resp 18     Temp 98.2 F (36.8 C)     Temp Source Oral     SpO2 98 %     Weight 154 lb (69.9 kg)     Height 5\' 5"  (1.651 m)     Head Circumference      Peak Flow      Pain Score 7     Pain Loc      Pain Edu?      Excl. in GC?      Constitutional: Alert and oriented. Well appearing and in no acute distress. Eyes: Conjunctivae are normal. PERRL.  EOMI. Head: Atraumatic. ENT:      Ears:      Nose: No congestion/rhinnorhea.      Mouth/Throat: Mucous membranes are moist.  Poor dentition.  Tenderness to palpation to right jaw.  No swelling. Neck: No stridor.   Cardiovascular: Normal rate, regular rhythm.  Good peripheral circulation. Respiratory: Normal respiratory effort without tachypnea or retractions. Lungs CTAB. Good air entry to the bases with no decreased or absent breath sounds. Gastrointestinal: Bowel sounds 4 quadrants. Soft and nontender to palpation. No guarding or rigidity. No palpable masses. No distention.  Musculoskeletal: Full range of motion to all extremities. No gross  deformities appreciated. Neurologic:  Normal speech and language. No gross focal neurologic deficits are appreciated.  Skin:  Skin is warm, dry and intact. No rash noted. Psychiatric: Mood and affect are normal. Speech and behavior are normal. Patient exhibits appropriate insight and judgement.   ____________________________________________   LABS (all labs ordered are listed, but only abnormal results are displayed)  Labs Reviewed  CBC  BASIC METABOLIC PANEL  POC URINE PREG, ED   ____________________________________________  EKG   ____________________________________________  RADIOLOGY  CT Soft Tissue Neck W Contrast  Result Date: 08/04/2019 CLINICAL DATA:  Right lower facial pain and neck pain. EXAM: CT NECK WITH CONTRAST TECHNIQUE: Multidetector CT imaging of the neck was performed using the standard protocol following the bolus administration of intravenous contrast. CONTRAST:  76mL OMNIPAQUE IOHEXOL 300 MG/ML  SOLN COMPARISON:  None. FINDINGS: Pharynx and larynx: The pharynx and larynx are normal. Normal epiglottis. No retropharyngeal abnormality. Diffusely poor dentition. Most notably, there is a large carie of tooth 31. There is no subperiosteal abscess or large periapical lucency. Salivary glands: No inflammation, mass, or stone.  Thyroid: Normal. Lymph nodes: None enlarged or abnormal density. Vascular: Negative. Limited intracranial: Negative. Visualized orbits: Negative. Mastoids and visualized paranasal sinuses: Clear. Skeleton: No acute or aggressive process. Upper chest: Negative. Other: None. IMPRESSION: 1. No acute abnormality of the neck. 2. Diffusely poor dentition. Most notably, there is a large carie of tooth 31. Correlate with dental exam. Electronically Signed   By: Ulyses Jarred M.D.   On: 08/04/2019 22:44    ____________________________________________    PROCEDURES  Procedure(s) performed:    Procedures    Medications  ibuprofen (ADVIL) tablet 600 mg (has no administration in time range)  amoxicillin (AMOXIL) capsule 500 mg (has no administration in time range)  iohexol (OMNIPAQUE) 300 MG/ML solution 75 mL (75 mLs Intravenous Contrast Given 08/04/19 2227)     ____________________________________________   INITIAL IMPRESSION / ASSESSMENT AND PLAN / ED COURSE  Pertinent labs & imaging results that were available during my care of the patient were reviewed by me and considered in my medical decision making (see chart for details).  Review of the [place] CSRS was performed in accordance of the [place] prior to dispensing any controlled drugs.   Patient presented to emergency department for evaluation of right jaw pain for 3 days.  Vital signs and exam are reassuring.  Lab work patient is unremarkable.  CT scan consistent with dental caries.  Patient will be covered for a dental infection.  Will be discharged home with prescriptions for amoxicillin. Patient is to follow up with primary care and dentistry as directed. Patient is given ED precautions to return to the ED for any worsening or new symptoms.  [name] was evaluated in Emergency Department on 08/04/2019 for the symptoms described in the history of present illness. She was evaluated in the context of the global COVID-19 pandemic, which  necessitated consideration that the patient might be at risk for infection with the SARS-CoV-2 virus that causes COVID-19. Institutional protocols and algorithms that pertain to the evaluation of patients at risk for COVID-19 are in a state of rapid change based on information released by regulatory bodies including the CDC and federal and state organizations. These policies and algorithms were followed during the patient's care in the ED.   ____________________________________________  FINAL CLINICAL IMPRESSION(S) / ED DIAGNOSES  Final diagnoses:  Dental caries      NEW MEDICATIONS STARTED DURING THIS VISIT:  ED Discharge Orders  Ordered    amoxicillin (AMOXIL) 500 MG capsule  3 times daily     08/04/19 2312              This chart was dictated using voice recognition software/Dragon. Despite best efforts to proofread, errors can occur which can change the meaning. Any change was purely unintentional.    Enid Derry, PA-C 08/05/19 0011    Emily Filbert, MD 08/11/19 1310

## 2019-08-04 NOTE — ED Notes (Signed)
Pt ambulatory to CT scan

## 2019-08-04 NOTE — ED Triage Notes (Signed)
Pt to the er for pain to the right lower jaw. Pt states all of her teeth are bad when asked specifically. Pt taking aleve at home with some relief.

## 2019-08-05 LAB — POCT PREGNANCY, URINE: Preg Test, Ur: NEGATIVE

## 2019-08-20 ENCOUNTER — Ambulatory Visit: Payer: Commercial Managed Care - PPO | Admitting: Psychology

## 2019-09-08 ENCOUNTER — Ambulatory Visit
Admission: EM | Admit: 2019-09-08 | Discharge: 2019-09-08 | Disposition: A | Discharge status: Home / Self Care | Payer: Commercial Managed Care - PPO

## 2019-09-08 ENCOUNTER — Ambulatory Visit (INDEPENDENT_AMBULATORY_CARE_PROVIDER_SITE_OTHER): Payer: Commercial Managed Care - PPO

## 2019-09-08 ENCOUNTER — Other Ambulatory Visit: Payer: Self-pay

## 2019-09-08 DIAGNOSIS — R0602 Shortness of breath: Secondary | ICD-10-CM | POA: Diagnosis not present

## 2019-09-08 DIAGNOSIS — J069 Acute upper respiratory infection, unspecified: Secondary | ICD-10-CM

## 2019-09-08 DIAGNOSIS — F17210 Nicotine dependence, cigarettes, uncomplicated: Secondary | ICD-10-CM | POA: Diagnosis not present

## 2019-09-08 DIAGNOSIS — R05 Cough: Secondary | ICD-10-CM

## 2019-09-08 DIAGNOSIS — J4521 Mild intermittent asthma with (acute) exacerbation: Secondary | ICD-10-CM

## 2019-09-08 MED ORDER — PREDNISONE 10 MG (21) PO TBPK: ORAL_TABLET | Freq: Every day | ORAL | 0 refills | Status: DC

## 2019-09-08 MED ORDER — DOXYCYCLINE HYCLATE 100 MG PO CAPS: 100.0000 mg | ORAL_CAPSULE | Freq: Two times a day (BID) | ORAL | 0 refills | Status: AC

## 2019-09-08 MED ORDER — BENZONATATE 200 MG PO CAPS: 200.0000 mg | ORAL_CAPSULE | Freq: Three times a day (TID) | ORAL | 0 refills | Status: DC | PRN

## 2019-09-08 NOTE — ED Triage Notes (Signed)
Pt presents with severe cough x one month with worsening over last two days.  States she coughed herself into an asthma attack and has had to use her inhaler twice in last 24 hours.  Reports tightness on right side of throat that is worse with coughing and tenderness to palpation in that area, intermittent diarrhea x 1 week, breaking out in sweats but afebrile.   States she has been to multiple urgent cares in last month.  Received one breathing treatment which helped.  Was given Prednisone for a rash which returned when Prednisone ran out. Has had multiple tests for COVID in the last 1-2 months, all were negative. Tested this morning for work requirement and does not necessarily want a test with [place] today.

## 2019-09-08 NOTE — Discharge Instructions (Signed)
It was very nice seeing you today in clinic. Thank you for entrusting me with your care.   Rest and make sure you are staying hydrated. I have sent in an antibiotic, steroids, and some pills for your cough. Continue your inhalers.   Make arrangements to follow up with your regular doctor in 1 week for re-evaluation if not improving. If your symptoms/condition worsens, please seek follow up care either here or in the ER. Please remember, our [facility] providers are "right here with you" when you need [place].   Again, it was my pleasure to take care of you today. Thank you for choosing our clinic. I hope that you start to feel better quickly.   Quentin Mulling, MSN, APRN, FNP-C, CEN Advanced Practice Provider [facility]

## 2019-09-09 NOTE — ED Provider Notes (Signed)
[place], [place]   Name: [name] DOB: [birth year] MRN: [id number] CSN: [id number] PCP: Kerman Passey, MD  Arrival date and time:  09/08/19 1035  Chief Complaint:  Cough  NOTE: Prior to seeing the patient today, I have reviewed the triage nursing documentation and vital signs. Clinical staff has updated patient's PMH/PSHx, current medication list, and drug allergies/intolerances to ensure comprehensive history available to assist in medical decision making.   History:   HPI: [name] is a 34 y.o. female who presents today with complaints of cough and shortness of breath that started approximately 1 month ago. Patient reports that her symptoms have acutely worsened over the last 2 days. Patient denies fevers; presents with an elevated temperature of 99.2 today. Cough has been non-productive. PMH (+) asthma; has been using prescribed SABA (albuterol) MDI. She was seen here on 07/08/2019 by Terrilee Croak, MD; notes reviewed. Patient treated for a viral respiratory illness, which helped her symptoms to improve "some". Over the last few days, patient has worsened overall.  She denies that she has experienced any nausea, vomiting, or abdominal pain. She has had some very mild intermittent diarrhea. She is eating and drinking well. Patient denies any perceived alterations to her sense of taste or smell.  Patient having pain in the RIGHT side of her neck. Patient denies being in close contact with anyone known to be ill; no one else is her home has experienced a similar symptom constellation. She has recently been tested for SARS-CoV-2 (novel coronavirus); last test was this morning at work. Patient has not been vaccinated for influenza this season. Despite her symptoms, patient has not taken any over the counter interventions to help improve/relieve her reported symptoms at home.   Past Medical History:  Diagnosis Date  . Allergy   . Asthma   . Bipolar 1 disorder (HCC)   . Bipolar disorder  (HCC)   . Ectopic pregnancy   . GERD (gastroesophageal reflux disease)   . History of concussion 2016  . Hyperlipidemia   . Migraine   . MRSA (methicillin resistant Staphylococcus aureus)   . Pseudoseizures   . Scoliosis   . Scoliosis     Past Surgical History:  Procedure Laterality Date  . CARPAL TUNNEL RELEASE  2015   left  . HERNIA REPAIR  2010  . TUBAL LIGATION      Family History  Problem Relation Age of Onset  . Asthma Mother   . Heart disease Mother   . Diabetes Mother   . Hyperlipidemia Mother   . Hypertension Mother   . Mental illness Mother   . Migraines Mother   . Thyroid disease Mother   . Stroke Mother   . Alcohol abuse Mother   . Bipolar disorder Mother   . Hypertension Sister   . Depression Sister   . Drug abuse Sister   . Cancer Maternal Grandmother   . Diabetes Maternal Grandmother   . Heart disease Maternal Grandmother   . Hyperlipidemia Maternal Grandmother   . Hypertension Maternal Grandmother   . Kidney disease Maternal Grandmother   . Heart disease Paternal Grandfather   . Alcohol abuse Paternal Grandfather   . Drug abuse Paternal Grandfather     Social History   Tobacco Use  . Smoking status: Current Every Day Smoker    Packs/day: 1.00    Types: Cigarettes  . Smokeless tobacco: Never Used  Substance Use Topics  . Alcohol use: No    Alcohol/week: 0.0 standard drinks  .  Drug use: No    Patient Active Problem List   Diagnosis Date Noted  . Need for diphtheria-tetanus-pertussis (Tdap) vaccine, adult/adolescent 04/05/2015  . Abnormal weight gain 03/12/2015  . Encounter for medication monitoring 03/12/2015  . Abdominal pain, acute, right lower quadrant 03/02/2015  . Bipolar affective disorder (HCC) 01/26/2015  . Convulsion (HCC) 01/25/2015  . Seizures (HCC) 01/12/2015  . Asthma   . Hyperlipidemia   . Bipolar 2 disorder (HCC)   . Allergy   . MRSA (methicillin resistant Staphylococcus aureus)   . Migraine headache with aura      Home Medications:    Current Meds  Medication Sig  . albuterol (VENTOLIN HFA) 108 (90 Base) MCG/ACT inhaler Inhale 2 puffs into the lungs every 4 (four) hours as needed for wheezing or shortness of breath.  . divalproex (DEPAKOTE ER) 500 MG 24 hr tablet Take 1 tablet (500 mg total) by mouth at bedtime.  . famotidine (PEPCID) 20 MG tablet Take 1 tablet (20 mg total) by mouth 2 (two) times daily.  Marland Kitchen ibuprofen (ADVIL) 600 MG tablet Take 1 tablet (600 mg total) by mouth every 6 (six) hours as needed.  . Multiple Vitamins-Minerals (MULTIVITAMIN WITH MINERALS) tablet Take 1 tablet by mouth daily.  Marland Kitchen Spacer/Aero-Holding Chambers (AEROCHAMBER PLUS) inhaler Use as instructed  . traZODone (DESYREL) 100 MG tablet Take 1 tablet (100 mg total) by mouth at bedtime as needed for sleep.  . vitamin B-12 (CYANOCOBALAMIN) 1000 MCG tablet Take 1,000 mcg by mouth daily.  . [DISCONTINUED] chlorpheniramine-HYDROcodone (TUSSIONEX PENNKINETIC ER) 10-8 MG/5ML SUER Take 5 mLs by mouth every 12 (twelve) hours as needed for cough.    Allergies:   Robitussin (alcohol free) [guaifenesin], Aspirin, Tramadol, Chocolate flavor, Codeine, and Strawberry extract  Review of Systems (ROS):  Review of systems NEGATIVE unless otherwise noted in narrative H&P section.   Vital Signs: Today's Vitals   09/08/19 1107 09/08/19 1112  BP:  (!) 113/92  Pulse:  87  Resp:  20  Temp:  99.2 F (37.3 C)  TempSrc:  Oral  SpO2:  98%  PainSc: 7      Physical Exam: Physical Exam  Constitutional: She is oriented to person, place, and time and well-developed, well-nourished, and in no distress.  HENT:  Head: Normocephalic and atraumatic.  Right Ear: Tympanic membrane normal.  Left Ear: Tympanic membrane normal.  Nose: Mucosal edema (mild) and rhinorrhea (mild) present. No sinus tenderness.  Mouth/Throat: Uvula is midline and mucous membranes are normal. Posterior oropharyngeal erythema present. No oropharyngeal exudate or  posterior oropharyngeal edema.  Eyes: Pupils are equal, round, and reactive to light.  Neck: No tracheal deviation present.  Cardiovascular: Normal rate, regular rhythm, normal heart sounds and intact distal pulses.  Pulmonary/Chest: Effort normal. She has wheezes (scattered expiratory). She has rhonchi (scattered in upper airways; clears completely with cough).  Moderate cough noted in clinic. No SOB or increased WOB. No distress. Able to speak in complete sentences without difficulties. SPO2 98% on RA.  Musculoskeletal:     Cervical back: Normal range of motion and neck supple.  Lymphadenopathy:       Head (right side): Submandibular adenopathy present.    She has no cervical adenopathy.  Neurological: She is alert and oriented to person, place, and time. Gait normal.  Skin: Skin is warm and dry. No rash noted. She is not diaphoretic.  Psychiatric: Mood, memory, affect and judgment normal.  Nursing note and vitals reviewed.   Urgent Care Treatments / Results:  Orders Placed This Encounter  Procedures  . DG Chest 2 View    LABS: PLEASE NOTE: all labs that were ordered this encounter are listed, however only abnormal results are displayed. Labs Reviewed - No data to display  EKG: -None  RADIOLOGY: DG Chest 2 View  Result Date: 09/08/2019 CLINICAL DATA:  Cough for 1.5 months EXAM: CHEST - 2 VIEW COMPARISON:  07/08/2019 FINDINGS: The heart size and mediastinal contours are within normal limits. Both lungs are clear. The visualized skeletal structures are unremarkable. IMPRESSION: No acute abnormality of the lungs. Electronically Signed   By: Eddie Candle M.D.   On: 09/08/2019 11:45    PROCEDURES: Procedures  MEDICATIONS RECEIVED THIS VISIT: Medications - No data to display  PERTINENT CLINICAL COURSE NOTES/UPDATES:   Initial Impression / Assessment and Plan / Urgent Care Course:  Pertinent labs & imaging results that were available during my care of the patient were  personally reviewed by me and considered in my medical decision making (see lab/imaging section of note for values and interpretations).  [name] is a 34 y.o. female who presents to [facility] today with complaints of Cough  Patient overall well appearing and in no acute distress today in clinic. Presenting symptoms (see HPI) and exam as documented above. Symptoms persistent over course of last 1 month. She has been treated conservatively for a viral URI, however symptoms have persisted. Patient notes worsening x 2 days. SARS-CoV-2 (novel coronavirus) tested this morning at work; results pending. Radiographs of the chest performed today revealed no acute cardiopulmonary process; no evidence of peribronchial thickening, areas of consolidation, or focal infiltrates. Suspect URI concurrent flare of her asthma. Will proceed with treatment as follows:   Doxycycline x 7 day course    Systemic steroid taper (prednisone) x 6 days   Discussed supportive care measures at home during acute phase of illness.   Patient to rest as much as possible.   Encouraged to ensure adequate hydration (water and ORS) to prevent dehydration and electrolyte derangements.  Patient may use APAP and/or IBU on an as needed basis for pain/fever.  Continue prescribed MDI as needed.    Will send in a supply of benzonatate (Tessalon) for patient to use on a PRN basis to help with her cough.   Current clinical condition warrants patient being out of work in order to quarantine while symptomatic and awaiting current SARS-CoV-2 test results. She was provided with the appropriate documentation to provide to her place of employment that will allow for her to RTW on 09/11/2019 with no restrictions. RTW is contingent on her SARS-CoV-2 test results being reviewed as negative.     Discussed follow up with primary care physician in 1 week for re-evaluation. I have reviewed the follow up and strict return precautions  for any new or worsening symptoms. Patient is aware of symptoms that would be deemed urgent/emergent, and would thus require further evaluation either here or in the emergency department. At the time of discharge, she verbalized understanding and consent with the discharge plan as it was reviewed with her. All questions were fielded by provider and/or clinic staff prior to patient discharge.    Final Clinical Impressions / Urgent Care Diagnoses:   Final diagnoses:  Acute upper respiratory infection  Mild intermittent asthma with exacerbation    New Prescriptions:  [place] Controlled Substance Registry consulted? Not Applicable  Meds ordered this encounter  Medications  . doxycycline (VIBRAMYCIN) 100 MG capsule    Sig: Take  1 capsule (100 mg total) by mouth 2 (two) times daily for 7 days.    Dispense:  14 capsule    Refill:  0  . predniSONE (STERAPRED UNI-PAK 21 TAB) 10 MG (21) TBPK tablet    Sig: Take by mouth daily. 60 mg x 1 day, 50 mg x 1 day, 40 mg x 1 day, 30 mg x 1 day, 20 mg x 1 day, 10 mg x 1 day    Dispense:  21 tablet    Refill:  0  . benzonatate (TESSALON) 200 MG capsule    Sig: Take 1 capsule (200 mg total) by mouth 3 (three) times daily as needed for cough.    Dispense:  21 capsule    Refill:  0    Recommended Follow up Care:  Patient encouraged to follow up with the following provider within the specified time frame, or sooner as dictated by the severity of her symptoms. As always, she was instructed that for any urgent/emergent care needs, she should seek care either here or in the emergency department for more immediate evaluation.  Follow-up Information    Lada, Janit Bern, MD In 1 week.   Specialty: Family Medicine Why: General reassessment of symptoms if not improving Contact information: [address] [place] [address] [phone]         NOTE: This note was prepared using Dragon dictation software along with smaller phrase technology. Despite my best ability  to proofread, there is the potential that transcriptional errors may still occur from this process, and are completely unintentional.     Verlee Monte, NP 09/09/19 2233

## 2019-09-10 NOTE — Telephone Encounter (Signed)
Opened in error

## 2019-09-15 ENCOUNTER — Ambulatory Visit (INDEPENDENT_AMBULATORY_CARE_PROVIDER_SITE_OTHER): Payer: Commercial Managed Care - PPO | Admitting: Psychiatry

## 2019-09-15 ENCOUNTER — Other Ambulatory Visit: Payer: Self-pay

## 2019-09-15 DIAGNOSIS — F3175 Bipolar disorder, in partial remission, most recent episode depressed: Secondary | ICD-10-CM

## 2019-09-15 MED ORDER — TRAZODONE HCL 100 MG PO TABS: 100.0000 mg | ORAL_TABLET | Freq: Every evening | ORAL | 0 refills | Status: DC | PRN

## 2019-09-15 MED ORDER — FLUOXETINE HCL 20 MG PO CAPS: 20.0000 mg | ORAL_CAPSULE | Freq: Every day | ORAL | 0 refills | Status: DC

## 2019-09-15 MED ORDER — DIVALPROEX SODIUM ER 500 MG PO TB24: 500.0000 mg | ORAL_TABLET | Freq: Every day | ORAL | 0 refills | Status: DC

## 2019-09-15 NOTE — Progress Notes (Signed)
Danielle Hill MD/PA/NP OP Progress Note  09/15/2019 4:08 PM [name]  MRN:  [id number] Interview was conducted by phone and I verified that I was speaking with the correct person using two identifiers. I discussed the limitations of evaluation and management by telemedicine and  the availability of in person appointments. Patient expressed understanding and agreed to proceed.  Chief Complaint: "Everything is going well and I sleep much better".  HPI: 34 yo separated white female with long hx of treatment for disorder who comes to establish regular psychiatric follow up.. She has been diagnosed and treated for this condition since age 76. She has a hx of manic episodes lasting few (not longer than 5) days which include increased energy, decreased need for sleep, racing thoughts, distractibility, increase in activity/restlessness. They have been disruptive to her daily functioning but she did not need to quit her job because of these in the past. She would have 5-6 of such episodes at the most per year. They are followed by depressive episodes which are much longer (one month or so) and were often severe enough that she felt suicidal and needed to be hospitalized. She has been in hospital 6 times: 5 as a teenager last one in her 50s. She denies having hx of suicidal attempts or psychosis. Sh does not abuse alcohol or drugs. She has been on a combination of fluoxetine and divalproex on and off for many years - it seems to work relatively well. She has been followed by Dr. Leonides Ross at [facility] earlier but she cannot see [facility] providers now that she has a Pharmacist, community. She had a break in treatment and was restarted on both medications in May 2020. Valproic acid level on 250 mg was 33 mcg/ml and dose was then doubled. In the past she was on 500 mg extended release at bedtime - it was helping with middle insomnia so we changed it to this dose/form. Her sleep did not improve much however. She sleeps anywhere from 3 to 5  hours at a time and feels tired in the morning. She tried Seroquel (excessively sedated) and zolpidem (did not help and actually appeared to be activating) in the past. We added trazodone and she sleeps well on 50 mg. [name] has changed  Her works schedule - now works 3rd shift.  Visit Diagnosis:    ICD-10-CM   1. Bipolar disorder, in partial remission, most recent episode depressed (HCC)  F31.75 FLUoxetine (PROZAC) 20 MG capsule    Past Psychiatric History: Please see intake H&P.  Past Medical History:  Past Medical History:  Diagnosis Date  . Allergy   . Asthma   . Bipolar 1 disorder ([place])   . Bipolar disorder ([place])   . Ectopic pregnancy   . GERD (gastroesophageal reflux disease)   . History of concussion 2016  . Hyperlipidemia   . Migraine   . MRSA (methicillin resistant Staphylococcus aureus)   . Pseudoseizures   . Scoliosis   . Scoliosis     Past Surgical History:  Procedure Laterality Date  . CARPAL TUNNEL RELEASE  2015   left  . HERNIA REPAIR  2010  . TUBAL LIGATION      Family Psychiatric History: Reviewed.  Family History:  Family History  Problem Relation Age of Onset  . Asthma Mother   . Heart disease Mother   . Diabetes Mother   . Hyperlipidemia Mother   . Hypertension Mother   . Mental illness Mother   . Migraines Mother   .  Thyroid disease Mother   . Stroke Mother   . Alcohol abuse Mother   . Bipolar disorder Mother   . Hypertension Sister   . Depression Sister   . Drug abuse Sister   . Cancer Maternal Grandmother   . Diabetes Maternal Grandmother   . Heart disease Maternal Grandmother   . Hyperlipidemia Maternal Grandmother   . Hypertension Maternal Grandmother   . Kidney disease Maternal Grandmother   . Heart disease Paternal Grandfather   . Alcohol abuse Paternal Grandfather   . Drug abuse Paternal Grandfather     Social History:  Social History   Socioeconomic History  . Marital status: Legally Separated    Spouse name: Not on  file  . Number of children: 2  . Years of education: Not on file  . Highest education level: GED or equivalent  Occupational History  . Not on file  Tobacco Use  . Smoking status: Current Every Day Smoker    Packs/day: 1.00    Types: Cigarettes  . Smokeless tobacco: Never Used  Substance and Sexual Activity  . Alcohol use: No    Alcohol/week: 0.0 standard drinks  . Drug use: No  . Sexual activity: Yes    Partners: Male    Birth control/protection: None, Surgical  Other Topics Concern  . Not on file  Social History Narrative  . Not on file   Social Determinants of Health   Financial Resource Strain: Low Risk   . Difficulty of Paying Living Expenses: Not hard at all  Food Insecurity: No Food Insecurity  . Worried About Programme researcher, broadcasting/film/video in the Last Year: Never true  . Ran Out of Food in the Last Year: Never true  Transportation Needs: No Transportation Needs  . Lack of Transportation (Medical): No  . Lack of Transportation (Non-Medical): No  Physical Activity: Inactive  . Days of Exercise per Week: 0 days  . Minutes of Exercise per Session: 0 min  Stress: No Stress Concern Present  . Feeling of Stress : Not at all  Social Connections: Somewhat Isolated  . Frequency of Communication with Friends and Family: More than three times a week  . Frequency of Social Gatherings with Friends and Family: Twice a week  . Attends Religious Services: More than 4 times per year  . Active Member of Clubs or Organizations: No  . Attends Banker Meetings: Never  . Marital Status: Separated    Allergies:  Allergies  Allergen Reactions  . Robitussin (Alcohol Free) [Guaifenesin] Anaphylaxis  . Aspirin Other (See Comments)    Nose bleeds  . Tramadol     Due to Seizures per patient  . Chocolate Flavor Rash  . Codeine Rash  . Strawberry Extract Rash    Metabolic Disorder Labs: No results found for: HGBA1C, MPG No results found for: PROLACTIN No results found for:  CHOL, TRIG, HDL, CHOLHDL, VLDL, LDLCALC Lab Results  Component Value Date   TSH 1.760 03/08/2015    Therapeutic Level Labs: No results found for: LITHIUM Lab Results  Component Value Date   VALPROATE 33 (L) 12/05/2018   VALPROATE 25 (L) 03/08/2015   No components found for:  CBMZ  Current Medications: Current Outpatient Medications  Medication Sig Dispense Refill  . albuterol (VENTOLIN HFA) 108 (90 Base) MCG/ACT inhaler Inhale 2 puffs into the lungs every 4 (four) hours as needed for wheezing or shortness of breath. 18 g 0  . benzonatate (TESSALON) 200 MG capsule Take 1 capsule (200 mg  total) by mouth 3 (three) times daily as needed for cough. 21 capsule 0  . divalproex (DEPAKOTE ER) 500 MG 24 hr tablet Take 1 tablet (500 mg total) by mouth at bedtime. 90 tablet 0  . doxycycline (VIBRAMYCIN) 100 MG capsule Take 1 capsule (100 mg total) by mouth 2 (two) times daily for 7 days. 14 capsule 0  . famotidine (PEPCID) 20 MG tablet Take 1 tablet (20 mg total) by mouth 2 (two) times daily. 60 tablet 0  . FLUoxetine (PROZAC) 20 MG capsule Take 1 capsule (20 mg total) by mouth daily. Take 1 tablet once daily 90 capsule 0  . fluticasone (FLONASE) 50 MCG/ACT nasal spray Place 1 spray into both nostrils 2 (two) times daily. 16 g 0  . ibuprofen (ADVIL) 600 MG tablet Take 1 tablet (600 mg total) by mouth every 6 (six) hours as needed. 30 tablet 0  . Multiple Vitamins-Minerals (MULTIVITAMIN WITH MINERALS) tablet Take 1 tablet by mouth daily.    . predniSONE (STERAPRED UNI-PAK 21 TAB) 10 MG (21) TBPK tablet Take by mouth daily. 60 mg x 1 day, 50 mg x 1 day, 40 mg x 1 day, 30 mg x 1 day, 20 mg x 1 day, 10 mg x 1 day 21 tablet 0  . Spacer/Aero-Holding Chambers (AEROCHAMBER PLUS) inhaler Use as instructed 1 each 2  . traZODone (DESYREL) 100 MG tablet Take 1 tablet (100 mg total) by mouth at bedtime as needed for sleep. 90 tablet 0  . vitamin B-12 (CYANOCOBALAMIN) 1000 MCG tablet Take 1,000 mcg by mouth  daily.     No current facility-administered medications for this visit.      Psychiatric Specialty Exam: Review of Systems  All other systems reviewed and are negative.   There were no vitals taken for this visit.There is no height or weight on file to calculate BMI.  General Appearance: NA  Eye Contact:  NA  Speech:  Clear and Coherent and Normal Rate  Volume:  Normal  Mood:  Euthymic  Affect:  NA  Thought Process:  Goal Directed and Linear  Orientation:  Full (Time, Place, and Person)  Thought Content: Logical   Suicidal Thoughts:  No  Homicidal Thoughts:  No  Memory:  Immediate;   Good  Judgement:  Good  Insight:  Good  Psychomotor Activity:  NA  Concentration:  Concentration: Good  Recall:  Good  Fund of Knowledge: Good  Language: Good  Akathisia:  Negative  Handed:  Right  AIMS (if indicated): not done  Assets:  Communication Skills Desire for Improvement Financial Resources/Insurance Housing Resilience Talents/Skills  ADL's:  Intact  Cognition: WNL  Sleep:  Good   Screenings: GAD-7     Office Visit from 11/14/2018 in [facility] Office Visit from 10/09/2018 in [facility]  Total GAD-7 Score  14  15    PHQ2-9     Office Visit from 07/08/2019 in [facility] Office Visit from 12/17/2018 in [facility] Office Visit from 12/08/2018 in [facility] Office Visit from 11/18/2018 in [facility] Office Visit from 11/14/2018 in [facility]  PHQ-2 Total Score  0  5  2  5  3   PHQ-9 Total Score  0  17  18  21  14        Assessment and Plan: 34 yo separated white female with long hx of treatment for disorder who comes to establish regular psychiatric follow up.  She has been diagnosed and treated for this condition since age 64. She has a hx of manic episodes lasting few (not longer than 5) days which include increased energy, decreased  need for sleep, racing thoughts, distractibility, increase in activity/restlessness. They have been disruptive to her daily functioning but she did not need to quit her job because of these in the past. She would have 5-6 of such episodes at the most per year. They are followed by depressive episodes which are much longer (one month or so) and were often severe enough that she felt suicidal and needed to be hospitalized. She has been in hospital 6 times: 5 as a teenager last one in her 63s. She denies having hx of suicidal attempts or psychosis. Sh does not abuse alcohol or drugs. She has been on a combination of fluoxetine and divalproex on and off for many years - it seems to work relatively well. She has been followed by Dr. Elesa Massed at [facility] earlier but she cannot see [facility] providers now that she has a Nurse, learning disability. She had a break in treatment and was restarted on both medications in May 2020. Valproic acid level on 250 mg was 33 mcg/ml and dose was then doubled. In the past she was on 500 mg extended release at bedtime - it was helping with middle insomnia so we changed it to this dose/form. Her sleep did not improve much however. She sleeps anywhere from 3 to 5 hours at a time and feels tired in the morning. She tried Seroquel (excessively sedated) and zolpidem (did not help and actually appeared to be activating) in the past. We added trazodone and she sleeps well on 50 mg. Tyana has changed  Her works schedule - now works 3rd shift.  Dx: Bipolar 2 disorder rapid cycling in remission  Plan Continue fluoxetine 20 mg daily, Depakote ER 500 mg at HS and trazodone for insomnia - she typically takes 50 mg. Next appointment in three months.We will need to recheck valproic acid at next appointment. The plan was discussed with patient who had an opportunity to ask questions and these were all answered. I spend59minutes in phone consultationwith the patient.     Magdalene Patricia, MD 09/15/2019,  4:08 PM

## 2019-09-29 ENCOUNTER — Ambulatory Visit (INDEPENDENT_AMBULATORY_CARE_PROVIDER_SITE_OTHER): Payer: Commercial Managed Care - PPO | Admitting: Psychology

## 2019-09-29 DIAGNOSIS — F3130 Bipolar disorder, current episode depressed, mild or moderate severity, unspecified: Secondary | ICD-10-CM | POA: Diagnosis not present

## 2019-10-13 ENCOUNTER — Ambulatory Visit (INDEPENDENT_AMBULATORY_CARE_PROVIDER_SITE_OTHER): Payer: Self-pay | Admitting: Family Medicine

## 2019-10-13 ENCOUNTER — Encounter: Payer: Self-pay | Admitting: Family Medicine

## 2019-10-13 ENCOUNTER — Telehealth: Payer: Self-pay | Admitting: Family Medicine

## 2019-10-13 ENCOUNTER — Ambulatory Visit: Payer: Commercial Managed Care - PPO | Admitting: Psychology

## 2019-10-13 DIAGNOSIS — R05 Cough: Secondary | ICD-10-CM

## 2019-10-13 DIAGNOSIS — R0602 Shortness of breath: Secondary | ICD-10-CM

## 2019-10-13 DIAGNOSIS — R053 Chronic cough: Secondary | ICD-10-CM

## 2019-10-13 DIAGNOSIS — J4540 Moderate persistent asthma, uncomplicated: Secondary | ICD-10-CM

## 2019-10-13 DIAGNOSIS — M654 Radial styloid tenosynovitis [de Quervain]: Secondary | ICD-10-CM | POA: Insufficient documentation

## 2019-10-13 MED ORDER — BREO ELLIPTA 200-25 MCG/INH IN AEPB: 1.0000 | INHALATION_SPRAY | Freq: Every day | RESPIRATORY_TRACT | 2 refills | Status: DC

## 2019-10-13 MED ORDER — BENZONATATE 200 MG PO CAPS: 200.0000 mg | ORAL_CAPSULE | Freq: Three times a day (TID) | ORAL | 0 refills | Status: DC | PRN

## 2019-10-13 NOTE — Progress Notes (Signed)
Name: [name]   MRN: [id number]    DOB: April 18, 1986   Date:10/13/2019       Progress Note  Subjective  Chief Complaint  Chief Complaint  Patient presents with  . Referral    For the past few months she has been evaluated by PCP and UC for coughing and dyspnea. She has had numerous negative covid test. She has missed alot of work because her job is very physical. She has to get well before she can return to work.  She has been treated with inhalers, steroids, antibiotics. Now she can not exert herself without giving out of breath she has pain when she can't breath.    I connected with  [name] on 10/13/19 at 11:40 AM EDT by telephone and verified that I am speaking with the correct person using two identifiers.  I discussed the limitations, risks, security and privacy concerns of performing an evaluation and management service by telephone and the availability of in person appointments. Staff also discussed with the patient that there may be a patient responsible charge related to this service. Patient Location: at home  Provider Location: [place]   HPI  Chronic cough/SOB: she was treated back in 09/2018 with tessalon perles and advised to take allergy medication, she did okay for a while, however from September until now she has been to multiple urgent cares and EC, took three rounds of prednisone and antibiotics. She had multiple CXR and negative COVID-19 tests. She states she lost some weight initially but gained it back. She quit smoking about 6 weeks ago. She is currently out of work because she keeps coughing and was sent home April 4 th, 2021 .    Patient Active Problem List   Diagnosis Date Noted  . Radial styloid tenosynovitis 10/13/2019  . Bipolar affective disorder (HCC) 01/26/2015  . Seizures (HCC) 01/12/2015  . Asthma   . Hyperlipidemia   . Bipolar 2 disorder (HCC)   . Allergy   . Migraine headache with aura     Past Surgical History:  Procedure  Laterality Date  . CARPAL TUNNEL RELEASE  2015   left  . HERNIA REPAIR  2010  . TUBAL LIGATION      Family History  Problem Relation Age of Onset  . Asthma Mother   . Heart disease Mother   . Diabetes Mother   . Hyperlipidemia Mother   . Hypertension Mother   . Mental illness Mother   . Migraines Mother   . Thyroid disease Mother   . Stroke Mother   . Alcohol abuse Mother   . Bipolar disorder Mother   . Hypertension Sister   . Depression Sister   . Drug abuse Sister   . Cancer Maternal Grandmother   . Diabetes Maternal Grandmother   . Heart disease Maternal Grandmother   . Hyperlipidemia Maternal Grandmother   . Hypertension Maternal Grandmother   . Kidney disease Maternal Grandmother   . Heart disease Paternal Grandfather   . Alcohol abuse Paternal Grandfather   . Drug abuse Paternal Grandfather     Social History   Tobacco Use  . Smoking status: Former Smoker    Packs/day: 1.00    Types: Cigarettes    Start date: 09/07/2019  . Smokeless tobacco: Never Used  Substance Use Topics  . Alcohol use: No    Alcohol/week: 0.0 standard drinks    Current Outpatient Medications:  .  albuterol (VENTOLIN HFA) 108 (90 Base) MCG/ACT inhaler, Inhale 2  puffs into the lungs every 4 (four) hours as needed for wheezing or shortness of breath., Disp: 18 g, Rfl: 0 .  divalproex (DEPAKOTE ER) 500 MG 24 hr tablet, Take 1 tablet (500 mg total) by mouth at bedtime., Disp: 90 tablet, Rfl: 0 .  FLUoxetine (PROZAC) 20 MG capsule, Take 1 capsule (20 mg total) by mouth daily. Take 1 tablet once daily, Disp: 90 capsule, Rfl: 0 .  fluticasone (FLONASE) 50 MCG/ACT nasal spray, Place 1 spray into both nostrils 2 (two) times daily., Disp: 16 g, Rfl: 0 .  ibuprofen (ADVIL) 600 MG tablet, Take 1 tablet (600 mg total) by mouth every 6 (six) hours as needed., Disp: 30 tablet, Rfl: 0 .  Multiple Vitamins-Minerals (MULTIVITAMIN WITH MINERALS) tablet, Take 1 tablet by mouth daily., Disp: , Rfl:  .   Spacer/Aero-Holding Chambers (AEROCHAMBER PLUS) inhaler, Use as instructed, Disp: 1 each, Rfl: 2 .  traZODone (DESYREL) 100 MG tablet, Take 1 tablet (100 mg total) by mouth at bedtime as needed for sleep., Disp: 90 tablet, Rfl: 0 .  vitamin B-12 (CYANOCOBALAMIN) 1000 MCG tablet, Take 1,000 mcg by mouth daily., Disp: , Rfl:  .  benzonatate (TESSALON) 200 MG capsule, Take 1 capsule (200 mg total) by mouth 3 (three) times daily as needed for cough., Disp: 100 capsule, Rfl: 0 .  famotidine (PEPCID) 20 MG tablet, Take 1 tablet (20 mg total) by mouth 2 (two) times daily., Disp: 60 tablet, Rfl: 0 .  fluticasone furoate-vilanterol (BREO ELLIPTA) 200-25 MCG/INH AEPB, Inhale 1 puff into the lungs daily., Disp: 60 each, Rfl: 2  Allergies  Allergen Reactions  . Robitussin (Alcohol Free) [Guaifenesin] Anaphylaxis  . Aspirin Other (See Comments)    Nose bleeds  . Tramadol     Due to Seizures per patient  . Chocolate Flavor Rash  . Codeine Rash  . Strawberry Extract Rash    I personally reviewed active problem list, medication list, allergies, family history, social history, health maintenance with the patient/caregiver today.   ROS  Ten systems reviewed and is negative except as mentioned in HPI   Objective  Virtual encounter, vitals not obtained.  There is no height or weight on file to calculate BMI.  Physical Exam  Awake, alert and oriented Sounding hoarse   PHQ2/9: Depression screen [facility] 2/9 10/13/2019 07/08/2019 12/17/2018 12/08/2018 11/18/2018  Decreased Interest 0 0 2 1 3   Down, Depressed, Hopeless 0 0 3 1 2   PHQ - 2 Score 0 0 5 2 5   Altered sleeping 0 0 3 3 3   Tired, decreased energy 2 0 3 2 3   Change in appetite 1 0 2 3 3   Feeling bad or failure about yourself  0 0 2 3 2   Trouble concentrating 1 0 2 2 2   Moving slowly or fidgety/restless 0 0 0 2 2  Suicidal thoughts 0 0 0 1 1  PHQ-9 Score 4 0 17 18 21   Difficult doing work/chores Not difficult at all Not difficult at all Very  difficult Somewhat difficult Very difficult   PHQ-2/9 Result is negative.    Fall Risk: Fall Risk  10/13/2019 07/08/2019 12/17/2018 12/08/2018 11/18/2018  Falls in the past year? 0 0 0 0 1  Number falls in past yr: 0 0 0 0 0  Injury with Fall? 0 0 0 0 1  Follow up - Falls evaluation completed - Falls evaluation completed Falls evaluation completed     Assessment & Plan  1. Chronic cough  - Ambulatory referral to  Pulmonology - benzonatate (TESSALON) 200 MG capsule; Take 1 capsule (200 mg total) by mouth 3 (three) times daily as needed for cough.  Dispense: 100 capsule; Refill: 0  2. SOB (shortness of breath)  - Ambulatory referral to Pulmonology - benzonatate (TESSALON) 200 MG capsule; Take 1 capsule (200 mg total) by mouth 3 (three) times daily as needed for cough.  Dispense: 100 capsule; Refill: 0  3. Asthma, moderate persistent, poorly-controlled  - Ambulatory referral to Pulmonology - fluticasone furoate-vilanterol (BREO ELLIPTA) 200-25 MCG/INH AEPB; Inhale 1 puff into the lungs daily.  Dispense: 60 each; Refill: 2.  I discussed the assessment and treatment plan with the patient. The patient was provided an opportunity to ask questions and all were answered. The patient agreed with the plan and demonstrated an understanding of the instructions.   The patient was advised to call back or seek an in-person evaluation if the symptoms worsen or if the condition fails to improve as anticipated.  I provided 25  minutes of non-face-to-face time during this encounter.  Loistine Chance, MD

## 2019-10-13 NOTE — Telephone Encounter (Signed)
Pt wanted  to let you know that she has sch an appt for May 20 @ 10:20 and needs those FMLA papers to make cover her appt date also for may 20th.

## 2019-10-13 NOTE — Telephone Encounter (Signed)
Copied from CRM [id number]. Topic: General - Inquiry >> Oct 13, 2019 12:39 PM Reggie Pile, NT wrote: Reason for CRM: Patient called in asking if she can get a call when [facility] paperwork is filled out, as she did not finish her side and would like to pick it up.

## 2019-10-13 NOTE — Telephone Encounter (Signed)
Patient will try to resend paperwork.

## 2019-11-02 ENCOUNTER — Emergency Department: Payer: Commercial Managed Care - PPO

## 2019-11-02 ENCOUNTER — Emergency Department
Admission: EM | Admit: 2019-11-02 | Discharge: 2019-11-02 | Disposition: A | Discharge status: Home / Self Care | Payer: Commercial Managed Care - PPO | Attending: Student in an Organized Health Care Education/Training Program | Admitting: Student in an Organized Health Care Education/Training Program

## 2019-11-02 ENCOUNTER — Other Ambulatory Visit: Payer: Self-pay

## 2019-11-02 ENCOUNTER — Encounter: Payer: Self-pay | Admitting: *Deleted

## 2019-11-02 DIAGNOSIS — N83201 Unspecified ovarian cyst, right side: Secondary | ICD-10-CM | POA: Insufficient documentation

## 2019-11-02 DIAGNOSIS — Z79899 Other long term (current) drug therapy: Secondary | ICD-10-CM | POA: Insufficient documentation

## 2019-11-02 DIAGNOSIS — R1031 Right lower quadrant pain: Secondary | ICD-10-CM | POA: Insufficient documentation

## 2019-11-02 DIAGNOSIS — Z87891 Personal history of nicotine dependence: Secondary | ICD-10-CM | POA: Insufficient documentation

## 2019-11-02 DIAGNOSIS — R197 Diarrhea, unspecified: Secondary | ICD-10-CM

## 2019-11-02 DIAGNOSIS — R102 Pelvic and perineal pain: Secondary | ICD-10-CM | POA: Insufficient documentation

## 2019-11-02 DIAGNOSIS — R109 Unspecified abdominal pain: Secondary | ICD-10-CM | POA: Diagnosis present

## 2019-11-02 DIAGNOSIS — R112 Nausea with vomiting, unspecified: Secondary | ICD-10-CM | POA: Insufficient documentation

## 2019-11-02 LAB — CBC
HCT: 39.3 % (ref 36.0–46.0)
Hemoglobin: 13.7 g/dL (ref 12.0–15.0)
MCH: 30.9 pg (ref 26.0–34.0)
MCHC: 34.9 g/dL (ref 30.0–36.0)
MCV: 88.7 fL (ref 80.0–100.0)
Platelets: 231 10*3/uL (ref 150–400)
RBC: 4.43 MIL/uL (ref 3.87–5.11)
RDW: 12.7 % (ref 11.5–15.5)
WBC: 10.7 10*3/uL — ABNORMAL HIGH (ref 4.0–10.5)
nRBC: 0 % (ref 0.0–0.2)

## 2019-11-02 LAB — URINALYSIS, COMPLETE (UACMP) WITH MICROSCOPIC
Bacteria, UA: NONE SEEN
Bilirubin Urine: NEGATIVE
Glucose, UA: NEGATIVE mg/dL
Hgb urine dipstick: NEGATIVE
Ketones, ur: NEGATIVE mg/dL
Leukocytes,Ua: NEGATIVE
Nitrite: NEGATIVE
Protein, ur: NEGATIVE mg/dL
Specific Gravity, Urine: 1.011 (ref 1.005–1.030)
pH: 7 (ref 5.0–8.0)

## 2019-11-02 LAB — COMPREHENSIVE METABOLIC PANEL
ALT: 60 U/L — ABNORMAL HIGH (ref 0–44)
AST: 51 U/L — ABNORMAL HIGH (ref 15–41)
Albumin: 4.1 g/dL (ref 3.5–5.0)
Alkaline Phosphatase: 53 U/L (ref 38–126)
Anion gap: 8 (ref 5–15)
BUN: 11 mg/dL (ref 6–20)
CO2: 27 mmol/L (ref 22–32)
Calcium: 9.4 mg/dL (ref 8.9–10.3)
Chloride: 104 mmol/L (ref 98–111)
Creatinine, Ser: 0.61 mg/dL (ref 0.44–1.00)
GFR calc Af Amer: >60 mL/min (ref >60)
GFR calc non Af Amer: >60 mL/min (ref >60)
Glucose, Bld: 97 mg/dL (ref 70–99)
Potassium: 4.1 mmol/L (ref 3.5–5.1)
Sodium: 139 mmol/L (ref 135–145)
Total Bilirubin: 0.5 mg/dL (ref 0.3–1.2)
Total Protein: 7.2 g/dL (ref 6.5–8.1)

## 2019-11-02 LAB — LIPASE, BLOOD: Lipase: 23 U/L (ref 11–51)

## 2019-11-02 LAB — POCT PREGNANCY, URINE: Preg Test, Ur: NEGATIVE

## 2019-11-02 MED ORDER — ONDANSETRON HCL 4 MG/2ML IJ SOLN
4.0000 mg | Freq: Once | INTRAMUSCULAR | Status: AC
  Administered 2019-11-02: 4 mg via INTRAVENOUS
  Filled 2019-11-02: qty 2

## 2019-11-02 MED ORDER — HYDROCODONE-ACETAMINOPHEN 5-325 MG PO TABS: 1.0000 | ORAL_TABLET | ORAL | 0 refills | Status: DC | PRN

## 2019-11-02 MED ORDER — SODIUM CHLORIDE 0.9% FLUSH: 3.0000 mL | Freq: Once | INTRAVENOUS | Status: DC

## 2019-11-02 MED ORDER — LACTATED RINGERS IV BOLUS
1000.0000 mL | Freq: Once | INTRAVENOUS | Status: AC
  Administered 2019-11-02: 1000 mL via INTRAVENOUS

## 2019-11-02 MED ORDER — PROMETHAZINE HCL 12.5 MG PO TABS: 12.5000 mg | ORAL_TABLET | Freq: Four times a day (QID) | ORAL | 0 refills | Status: DC | PRN

## 2019-11-02 MED ORDER — IOHEXOL 300 MG/ML  SOLN
100.0000 mL | Freq: Once | INTRAMUSCULAR | Status: AC | PRN
  Administered 2019-11-02: 100 mL via INTRAVENOUS
  Filled 2019-11-02: qty 100

## 2019-11-02 MED ORDER — MORPHINE SULFATE (PF) 4 MG/ML IV SOLN
4.0000 mg | INTRAVENOUS | Status: DC | PRN
  Administered 2019-11-02: 4 mg via INTRAVENOUS
  Filled 2019-11-02: qty 1

## 2019-11-02 MED ORDER — OXYCODONE-ACETAMINOPHEN 5-325 MG PO TABS
1.0000 | ORAL_TABLET | Freq: Once | ORAL | Status: AC
  Administered 2019-11-02: 1 via ORAL
  Filled 2019-11-02: qty 1

## 2019-11-02 NOTE — ED Notes (Signed)
Patient discharged to home per MD order. Patient in stable condition, and deemed medically cleared by ED provider for discharge. Discharge instructions reviewed with patient/family using "Teach Back"; verbalized understanding of medication education and administration, and information about follow-up care. Denies further concerns. ° °

## 2019-11-02 NOTE — ED Notes (Signed)
Pt pending US  

## 2019-11-02 NOTE — ED Notes (Signed)
poct pregnancy Negative 

## 2019-11-02 NOTE — ED Provider Notes (Signed)
[facility] Emergency Department Provider Note    First MD Initiated Contact with Patient 11/02/19 1948     (approximate)  I have reviewed the triage vital signs and the nursing notes.   HISTORY  Chief Complaint Abdominal Pain    HPI [name] is a 34 y.o. female with the below listed past medical history presents to the ER for progressively worsening crampy and sore right lower quadrant pain associated with nausea vomiting and diarrhea decreased and p.o. intake over the past 24 hours 2/2 N/V.  Is not had any measured fevers.  Did have some chills.  States that the stool was nonbloody nonmelanotic.  Denies any sick contacts.  denies vaginal discharge or bleeding.  No dysuria or flank pain.   Past Medical History:  Diagnosis Date  . Allergy   . Asthma   . Bipolar 1 disorder (HCC)   . Bipolar disorder (HCC)   . Ectopic pregnancy   . GERD (gastroesophageal reflux disease)   . History of concussion 2016  . Hyperlipidemia   . Migraine   . MRSA (methicillin resistant Staphylococcus aureus)   . Pseudoseizures   . Scoliosis   . Scoliosis    Family History  Problem Relation Age of Onset  . Asthma Mother   . Heart disease Mother   . Diabetes Mother   . Hyperlipidemia Mother   . Hypertension Mother   . Mental illness Mother   . Migraines Mother   . Thyroid disease Mother   . Stroke Mother   . Alcohol abuse Mother   . Bipolar disorder Mother   . Hypertension Sister   . Depression Sister   . Drug abuse Sister   . Cancer Maternal Grandmother   . Diabetes Maternal Grandmother   . Heart disease Maternal Grandmother   . Hyperlipidemia Maternal Grandmother   . Hypertension Maternal Grandmother   . Kidney disease Maternal Grandmother   . Heart disease Paternal Grandfather   . Alcohol abuse Paternal Grandfather   . Drug abuse Paternal Grandfather    Past Surgical History:  Procedure Laterality Date  . CARPAL TUNNEL RELEASE  2015   left  .  HERNIA REPAIR  2010  . TUBAL LIGATION     Patient Active Problem List   Diagnosis Date Noted  . Radial styloid tenosynovitis 10/13/2019  . Bipolar affective disorder (HCC) 01/26/2015  . Seizures (HCC) 01/12/2015  . Asthma   . Hyperlipidemia   . Bipolar 2 disorder (HCC)   . Allergy   . Migraine headache with aura       Prior to Admission medications   Medication Sig Start Date End Date Taking? Authorizing Provider  albuterol (VENTOLIN HFA) 108 (90 Base) MCG/ACT inhaler Inhale 2 puffs into the lungs every 4 (four) hours as needed for wheezing or shortness of breath. 07/08/19   Domenick Gong, MD  benzonatate (TESSALON) 200 MG capsule Take 1 capsule (200 mg total) by mouth 3 (three) times daily as needed for cough. 10/13/19   Alba Cory, MD  divalproex (DEPAKOTE ER) 500 MG 24 hr tablet Take 1 tablet (500 mg total) by mouth at bedtime. 09/15/19 12/14/19  Pucilowski, Roosvelt Maser, MD  famotidine (PEPCID) 20 MG tablet Take 1 tablet (20 mg total) by mouth 2 (two) times daily. 07/08/19 09/08/19  Domenick Gong, MD  FLUoxetine (PROZAC) 20 MG capsule Take 1 capsule (20 mg total) by mouth daily. Take 1 tablet once daily 09/15/19 12/14/19  Pucilowski, Roosvelt Maser, MD  fluticasone Aleda Grana)  50 MCG/ACT nasal spray Place 1 spray into both nostrils 2 (two) times daily. 09/07/18   Cuthriell, Delorise Royals, PA-C  fluticasone furoate-vilanterol (BREO ELLIPTA) 200-25 MCG/INH AEPB Inhale 1 puff into the lungs daily. 10/13/19   Alba Cory, MD  HYDROcodone-acetaminophen (NORCO) 5-325 MG tablet Take 1 tablet by mouth every 4 (four) hours as needed for moderate pain. 11/02/19   Willy Eddy, MD  ibuprofen (ADVIL) 600 MG tablet Take 1 tablet (600 mg total) by mouth every 6 (six) hours as needed. 07/08/19   Domenick Gong, MD  Multiple Vitamins-Minerals (MULTIVITAMIN WITH MINERALS) tablet Take 1 tablet by mouth daily.    [provider]  promethazine (PHENERGAN) 12.5 MG tablet Take 1 tablet (12.5 mg  total) by mouth every 6 (six) hours as needed. 11/02/19   Willy Eddy, MD  Spacer/Aero-Holding Chambers (AEROCHAMBER PLUS) inhaler Use as instructed 07/08/19   Domenick Gong, MD  traZODone (DESYREL) 100 MG tablet Take 1 tablet (100 mg total) by mouth at bedtime as needed for sleep. 09/15/19 12/14/19  Pucilowski, Roosvelt Maser, MD  vitamin B-12 (CYANOCOBALAMIN) 1000 MCG tablet Take 1,000 mcg by mouth daily.    [provider]    Allergies Robitussin (alcohol free) [guaifenesin], Aspirin, Tramadol, Chocolate flavor, Codeine, and Strawberry extract    Social History Social History   Tobacco Use  . Smoking status: Former Smoker    Packs/day: 1.00    Types: Cigarettes    Start date: 09/07/2019  . Smokeless tobacco: Never Used  Substance Use Topics  . Alcohol use: No    Alcohol/week: 0.0 standard drinks  . Drug use: No    Review of Systems Patient denies headaches, rhinorrhea, blurry vision, numbness, shortness of breath, chest pain, edema, cough, abdominal pain, nausea, vomiting, diarrhea, dysuria, fevers, rashes or hallucinations unless otherwise stated above in HPI. ____________________________________________   PHYSICAL EXAM:  VITAL SIGNS: Vitals:   11/02/19 1834  BP: 113/74  Pulse: 100  Resp: 18  Temp: (!) 97.5 F (36.4 C)  SpO2: 99%    Constitutional: Alert and oriented.  Eyes: Conjunctivae are normal.  Head: Atraumatic. Nose: No congestion/rhinnorhea. Mouth/Throat: Mucous membranes are moist.   Neck: No stridor. Painless ROM.  Cardiovascular: Normal rate, regular rhythm. Grossly normal heart sounds.  Good peripheral circulation. Respiratory: Normal respiratory effort.  No retractions. Lungs CTAB. Gastrointestinal: Soft with mild ttp in rlq, no guarding or rebound. No distention. No abdominal bruits. No CVA tenderness. Genitourinary: deferred Musculoskeletal: No lower extremity tenderness nor edema.  No joint effusions. Neurologic:  Normal speech and  language. No gross focal neurologic deficits are appreciated. No facial droop Skin:  Skin is warm, dry and intact. No rash noted. Psychiatric: Mood and affect are normal. Speech and behavior are normal.  ____________________________________________   LABS (all labs ordered are listed, but only abnormal results are displayed)  Results for orders placed or performed during the hospital encounter of 11/02/19 (from the past 24 hour(s))  Lipase, blood     Status: None   Collection Time: 11/02/19  6:37 PM  Result Value Ref Range   Lipase 23 11 - 51 U/L  Comprehensive metabolic panel     Status: Abnormal   Collection Time: 11/02/19  6:37 PM  Result Value Ref Range   Sodium 139 135 - 145 mmol/L   Potassium 4.1 3.5 - 5.1 mmol/L   Chloride 104 98 - 111 mmol/L   CO2 27 22 - 32 mmol/L   Glucose, Bld 97 70 - 99 mg/dL   BUN  11 6 - 20 mg/dL   Creatinine, Ser 6.96 0.44 - 1.00 mg/dL   Calcium 9.4 8.9 - 29.5 mg/dL   Total Protein 7.2 6.5 - 8.1 g/dL   Albumin 4.1 3.5 - 5.0 g/dL   AST 51 (H) 15 - 41 U/L   ALT 60 (H) 0 - 44 U/L   Alkaline Phosphatase 53 38 - 126 U/L   Total Bilirubin 0.5 0.3 - 1.2 mg/dL   GFR calc non Af Amer >60 >60 mL/min   GFR calc Af Amer >60 >60 mL/min   Anion gap 8 5 - 15  CBC     Status: Abnormal   Collection Time: 11/02/19  6:37 PM  Result Value Ref Range   WBC 10.7 (H) 4.0 - 10.5 K/uL   RBC 4.43 3.87 - 5.11 MIL/uL   Hemoglobin 13.7 12.0 - 15.0 g/dL   HCT 28.4 13.2 - 44.0 %   MCV 88.7 80.0 - 100.0 fL   MCH 30.9 26.0 - 34.0 pg   MCHC 34.9 30.0 - 36.0 g/dL   RDW 10.2 72.5 - 36.6 %   Platelets 231 150 - 400 K/uL   nRBC 0.0 0.0 - 0.2 %  Urinalysis, Complete w Microscopic     Status: Abnormal   Collection Time: 11/02/19  6:37 PM  Result Value Ref Range   Color, Urine YELLOW (A) YELLOW   APPearance CLEAR (A) CLEAR   Specific Gravity, Urine 1.011 1.005 - 1.030   pH 7.0 5.0 - 8.0   Glucose, UA NEGATIVE NEGATIVE mg/dL   Hgb urine dipstick NEGATIVE NEGATIVE    Bilirubin Urine NEGATIVE NEGATIVE   Ketones, ur NEGATIVE NEGATIVE mg/dL   Protein, ur NEGATIVE NEGATIVE mg/dL   Nitrite NEGATIVE NEGATIVE   Leukocytes,Ua NEGATIVE NEGATIVE   RBC / HPF 0-5 0 - 5 RBC/hpf   WBC, UA 0-5 0 - 5 WBC/hpf   Bacteria, UA NONE SEEN NONE SEEN   Squamous Epithelial / LPF 0-5 0 - 5  Pregnancy, urine POC     Status: None   Collection Time: 11/02/19  6:41 PM  Result Value Ref Range   Preg Test, Ur NEGATIVE NEGATIVE   ____________________________________________  EKG____________________________________________  RADIOLOGY  I personally reviewed all radiographic images ordered to evaluate for the above acute complaints and reviewed radiology reports and findings.  These findings were personally discussed with the patient.  Please see medical record for radiology report.  ____________________________________________   PROCEDURES  Procedure(s) performed:  Procedures    Critical Care performed: no ____________________________________________   INITIAL IMPRESSION / ASSESSMENT AND PLAN / ED COURSE  Pertinent labs & imaging results that were available during my care of the patient were reviewed by me and considered in my medical decision making (see chart for details).   DDX: appendicitis, colitis, diverticulitis, enteritis, pyelo, cystitis, msk strain, doubt toa, torsion or pid  [name] is a 34 y.o. who presents to the ED with presentation as described above.  Patient nontoxic-appearing does have some abdominal discomfort no guarding or rebound.  Seems more abdominal than pelvic in nature. She is not pregnant.  Primary sx seem to be N/V/D.  Likely entiritis but given location of pain and borderline leukocytosis CT imaging was ordered for the above differential.  No evidence of appy or colitis. did show evidence of adnexal cyst. Doubt PID given lack of discharge or bleeding and symptoms started with N//V/D Ultrasound was ordered to further characterize.   Not clinically suspected to be consistent with TOA or  torsion.  Ultrasound is reassuring does show evidence of ovarian cyst without torsion or complicating features.  Not consistent with abscess.  Her exam is otherwise benign.  She is afebrile and hemodynamically stable.  Pain improved with medication.  At this point I do believe she stable and appropriate for outpatient follow-up.     The patient was evaluated in Emergency Department today for the symptoms described in the history of present illness. He/she was evaluated in the context of the global COVID-19 pandemic, which necessitated consideration that the patient might be at risk for infection with the SARS-CoV-2 virus that causes COVID-19. Institutional protocols and algorithms that pertain to the evaluation of patients at risk for COVID-19 are in a state of rapid change based on information released by regulatory bodies including the CDC and federal and state organizations. These policies and algorithms were followed during the patient's care in the ED.  As part of my medical decision making, I reviewed the following data within the [place] notes reviewed and incorporated, Labs reviewed, notes from prior ED visits and [place] Controlled Substance Database   ____________________________________________   FINAL CLINICAL IMPRESSION(S) / ED DIAGNOSES  Final diagnoses:  Abdominal pain, RLQ  Nausea vomiting and diarrhea      NEW MEDICATIONS STARTED DURING THIS VISIT:  New Prescriptions   HYDROCODONE-ACETAMINOPHEN (NORCO) 5-325 MG TABLET    Take 1 tablet by mouth every 4 (four) hours as needed for moderate pain.   PROMETHAZINE (PHENERGAN) 12.5 MG TABLET    Take 1 tablet (12.5 mg total) by mouth every 6 (six) hours as needed.     Note:  This document was prepared using Dragon voice recognition software and may include unintentional dictation errors.    Merlyn Lot, MD 11/02/19 2148

## 2019-11-02 NOTE — ED Triage Notes (Signed)
Pt has abd pain.  No vag bleeding  Pt also reports lower back pain.  Diarrhea yesterday.  Vomiting today. Pt alert.

## 2019-11-02 NOTE — Discharge Instructions (Addendum)
You have been seen in the emergency department for emergency care. It is important that you contact your own doctor, specialist or the closest clinic for follow-up care. Please bring this instruction sheet, all medications and X-ray copies with you when you are seen for follow-up care.  Determining the exact cause for all patients with abdominal pain is extremely difficult in the emergency department. Our primary focus is to rule-out immediate life-threatening diseases. If no immediate source of pain is found the definitive diagnosis frequently needs to be determined over time.Many times your primary care physician can determine the cause by following the symptoms over time. Sometimes, specialist are required such as Gastroenterologists, Gynecologists, Urologists or Surgeons. Please return immediately to the Emergency Department for fever>101, Vomiting or Intractable Pain. You should return to the emergency department or see your primary care provider in 12-24hrs if your pain is no better and sooner if your pain becomes worse.   IMPRESSION: Normal uterus.   Probable dominant follicle/simple ovarian cyst within the right ovary measuring 2.5 x 2.4 x 2.3 cm.     Electronically Signed   By: Jonna Clark M.D.   On: 11/02/2019 21:35

## 2019-11-11 ENCOUNTER — Ambulatory Visit (INDEPENDENT_AMBULATORY_CARE_PROVIDER_SITE_OTHER): Payer: Commercial Managed Care - PPO | Admitting: Obstetrics & Gynecology

## 2019-11-11 ENCOUNTER — Encounter: Payer: Self-pay | Admitting: Obstetrics & Gynecology

## 2019-11-11 ENCOUNTER — Other Ambulatory Visit: Payer: Self-pay

## 2019-11-11 ENCOUNTER — Other Ambulatory Visit (HOSPITAL_COMMUNITY)
Admission: RE | Admit: 2019-11-11 | Discharge: 2019-11-11 | Disposition: A | Discharge status: Home / Self Care | Payer: Commercial Managed Care - PPO | Source: Ambulatory Visit | Attending: Obstetrics & Gynecology | Admitting: Obstetrics & Gynecology

## 2019-11-11 VITALS — BP 100/60 | Ht 65.0 in | Wt 164.0 lb

## 2019-11-11 DIAGNOSIS — N83201 Unspecified ovarian cyst, right side: Secondary | ICD-10-CM

## 2019-11-11 DIAGNOSIS — N946 Dysmenorrhea, unspecified: Secondary | ICD-10-CM

## 2019-11-11 DIAGNOSIS — Z124 Encounter for screening for malignant neoplasm of cervix: Secondary | ICD-10-CM

## 2019-11-11 DIAGNOSIS — N9419 Other specified dyspareunia: Secondary | ICD-10-CM | POA: Diagnosis not present

## 2019-11-11 DIAGNOSIS — R102 Pelvic and perineal pain: Secondary | ICD-10-CM

## 2019-11-11 NOTE — Progress Notes (Addendum)
Obstetrics & Gynecology Office Visit   Chief Complaint: Pelvic Pain  History of Present Illness: 33 y.o. [id number] presenting for initial evaluation of dyspareunia.  Also, she reports Dysmenorrhea and Recent RLQ pain related to a Right Ovarian Cyst found in ER by [place] when she was seen for that  Last week.  She has had RIGHT SIDED PAIN for 3 weeks, which is a new problem for her, with associated n/v/d for 2 days now resolved.  Pain is still present but more midline and associated w her period pain.  She has monthly cycles and bleeds for 4 days and has severe pain for 4 days, sometimes having to miss work.  Dyspareunia:  Symptoms onset was greateer than a year ago and symptoms have been worsening.  Pain is with contact and deep, and often she cannot walk or function after sex.    The patient is not menopausal.  She is not currently on any HRT or hormonal medications.  Prior surgery for ectopic (right) and surgery for BTL, and prior NSVD x2.  No desire for future fertility.  Prior right inguinal hernia repair w mesh as well.  Pain is most pronounced with initial penetration, with deep penetration and during coitus and post coital.  She has postcoital spotting.  Last pap smear on (many years ago) was no abnormalities.  She does not a history of sexually transmitted infections.  Associated symptoms include none.  She denies recent antibiotic exposure, denies changes in soaps, detergents coinciding with the onset of her symptoms.  She has not previously self treated or been under treatment by another provider for these symptoms.   Review of Systems  Constitutional: Negative for chills, fever and malaise/fatigue.  HENT: Negative for congestion, sinus pain and sore throat.   Eyes: Negative for blurred vision and pain.  Respiratory: Negative for cough and wheezing.   Cardiovascular: Negative for chest pain and leg swelling.  Gastrointestinal: Positive for abdominal pain. Negative for constipation,  diarrhea, heartburn, nausea and vomiting.  Genitourinary: Negative for dysuria, frequency, hematuria and urgency.  Musculoskeletal: Negative for back pain, joint pain, myalgias and neck pain.  Skin: Negative for itching and rash.  Neurological: Negative for dizziness, tremors and weakness.  Endo/Heme/Allergies: Does not bruise/bleed easily.  Psychiatric/Behavioral: Negative for depression. The patient is not nervous/anxious and does not have insomnia.     Past Medical History:  Past Medical History:  Diagnosis Date  . Allergy   . Asthma   . Bipolar 1 disorder ([place])   . Bipolar disorder ([place])   . Ectopic pregnancy   . GERD (gastroesophageal reflux disease)   . History of concussion 2016  . Hyperlipidemia   . Migraine   . MRSA (methicillin resistant Staphylococcus aureus)   . Pseudoseizures   . Scoliosis   . Scoliosis     Past Surgical History:  Past Surgical History:  Procedure Laterality Date  . CARPAL TUNNEL RELEASE  2015   left  . HERNIA REPAIR  2010  . TUBAL LIGATION      Gynecologic History: Patient's last menstrual period was 11/09/2019.  Obstetric History: [id number]  Family History:  Family History  Problem Relation Age of Onset  . Asthma Mother   . Heart disease Mother   . Diabetes Mother   . Hyperlipidemia Mother   . Hypertension Mother   . Mental illness Mother   . Migraines Mother   . Thyroid disease Mother   . Stroke Mother   . Alcohol abuse Mother   .  Bipolar disorder Mother   . Hypertension Sister   . Depression Sister   . Drug abuse Sister   . Cancer Maternal Grandmother   . Diabetes Maternal Grandmother   . Heart disease Maternal Grandmother   . Hyperlipidemia Maternal Grandmother   . Hypertension Maternal Grandmother   . Kidney disease Maternal Grandmother   . Heart disease Paternal Grandfather   . Alcohol abuse Paternal Grandfather   . Drug abuse Paternal Grandfather     Social History:  Social History   Socioeconomic History  .  Marital status: Legally Separated    Spouse name: Not on file  . Number of children: 2  . Years of education: Not on file  . Highest education level: GED or equivalent  Occupational History  . Not on file  Tobacco Use  . Smoking status: Former Smoker    Packs/day: 1.00    Types: Cigarettes    Start date: 09/07/2019  . Smokeless tobacco: Never Used  Substance and Sexual Activity  . Alcohol use: No    Alcohol/week: 0.0 standard drinks  . Drug use: No  . Sexual activity: Yes    Partners: Male    Birth control/protection: None, Surgical  Other Topics Concern  . Not on file  Social History Narrative  . Not on file   Social Determinants of Health   Financial Resource Strain:   . Difficulty of Paying Living Expenses:   Food Insecurity:   . Worried About Programme researcher, broadcasting/film/video in the Last Year:   . Barista in the Last Year:   Transportation Needs:   . Freight forwarder (Medical):   Marland Kitchen Lack of Transportation (Non-Medical):   Physical Activity:   . Days of Exercise per Week:   . Minutes of Exercise per Session:   Stress:   . Feeling of Stress :   Social Connections:   . Frequency of Communication with Friends and Family:   . Frequency of Social Gatherings with Friends and Family:   . Attends Religious Services:   . Active Member of Clubs or Organizations:   . Attends Banker Meetings:   Marland Kitchen Marital Status:   Intimate Partner Violence:   . Fear of Current or Ex-Partner:   . Emotionally Abused:   Marland Kitchen Physically Abused:   . Sexually Abused:     Allergies:  Allergies  Allergen Reactions  . Robitussin (Alcohol Free) [Guaifenesin] Anaphylaxis  . Aspirin Other (See Comments)    Nose bleeds  . Tramadol     Due to Seizures per patient  . Chocolate Flavor Rash  . Codeine Rash  . Strawberry Extract Rash    Medications: Prior to Admission medications   Medication Sig Start Date End Date Taking? Authorizing Provider  albuterol (VENTOLIN HFA) 108 (90  Base) MCG/ACT inhaler Inhale 2 puffs into the lungs every 4 (four) hours as needed for wheezing or shortness of breath. 07/08/19  Yes Domenick Gong, MD  benzonatate (TESSALON) 200 MG capsule Take 1 capsule (200 mg total) by mouth 3 (three) times daily as needed for cough. 10/13/19  Yes Sowles, Danna Hefty, MD  divalproex (DEPAKOTE ER) 500 MG 24 hr tablet Take 1 tablet (500 mg total) by mouth at bedtime. 09/15/19 12/14/19 Yes Pucilowski, Olgierd A, MD  FLUoxetine (PROZAC) 20 MG capsule Take 1 capsule (20 mg total) by mouth daily. Take 1 tablet once daily 09/15/19 12/14/19 Yes Pucilowski, Olgierd A, MD  fluticasone (FLONASE) 50 MCG/ACT nasal spray Place 1 spray into both  nostrils 2 (two) times daily. 09/07/18  Yes Cuthriell, Delorise Royals, PA-C  fluticasone furoate-vilanterol (BREO ELLIPTA) 200-25 MCG/INH AEPB Inhale 1 puff into the lungs daily. 10/13/19  Yes Sowles, Danna Hefty, MD  HYDROcodone-acetaminophen (NORCO) 5-325 MG tablet Take 1 tablet by mouth every 4 (four) hours as needed for moderate pain. 11/02/19  Yes Willy Eddy, MD  ibuprofen (ADVIL) 600 MG tablet Take 1 tablet (600 mg total) by mouth every 6 (six) hours as needed. 07/08/19  Yes Domenick Gong, MD  Multiple Vitamins-Minerals (MULTIVITAMIN WITH MINERALS) tablet Take 1 tablet by mouth daily.   Yes [provider]  promethazine (PHENERGAN) 12.5 MG tablet Take 1 tablet (12.5 mg total) by mouth every 6 (six) hours as needed. 11/02/19  Yes Willy Eddy, MD  Spacer/Aero-Holding Chambers (AEROCHAMBER PLUS) inhaler Use as instructed 07/08/19  Yes Domenick Gong, MD  traZODone (DESYREL) 100 MG tablet Take 1 tablet (100 mg total) by mouth at bedtime as needed for sleep. 09/15/19 12/14/19 Yes Pucilowski, Olgierd A, MD  vitamin B-12 (CYANOCOBALAMIN) 1000 MCG tablet Take 1,000 mcg by mouth daily.   Yes [provider]  famotidine (PEPCID) 20 MG tablet Take 1 tablet (20 mg total) by mouth 2 (two) times daily. 07/08/19 09/08/19  Domenick Gong, MD    Physical Exam Blood pressure 100/60, height 5\' 5"  (1.651 m), weight 164 lb (74.4 kg), last menstrual period 11/09/2019.  Patient's last menstrual period was 11/09/2019.  General: NAD HEENT: normocephalic, anicteric Thyroid: no enlargement, no palpable nodules Pulmonary: No increased work of breathing Cardiovascular: RRR, distal pulses 2+ Abdomen: NABS, soft, non-tender, non-distended.  Umbilicus without lesions.  No hepatomegaly, splenomegaly or masses palpable. No evidence of hernia  Genitourinary:  External: Normal external female genitalia.  Normal urethral meatus, normal  Bartholin's and Skene's glands.    Vagina: Normal vaginal mucosa, no evidence of prolapse.    Cervix: Grossly normal in appearance, no bleeding  Uterus: Non-enlarged, mobile, normal contour.  No CMT  Adnexa: ovaries non-enlarged, no adnexal masses  Rectal: deferred  Lymphatic: no evidence of inguinal lymphadenopathy Extremities: no edema, erythema, or tenderness Neurologic: Grossly intact Psychiatric: mood appropriate, affect full  Female chaperone present for pelvic  portions of the physical exam  Assessment: 34 y.o. 19  With New evaluation and management surrounding pelvic pain and especially dyspareunia and dysmenorrhea  Plan: Problem List Items Addressed This Visit    Dysmenorrhea       Pelvic pain       Dyspareunia due to medical condition in female       Right ovarian cyst       Screening for cervical cancer       Relevant Orders   Cytology - PAP today    Options of investigation for adhesions, endometriosis, persistence or worsening cyst discussed, also option for management with hysterectomy due to the longer term nature of her pain w sex and pain w periods.  Does not wish to manage with any hormonal options presented to her.  Pros and cons of surgery, recovery discussed.  Would preserve ovaries.  Pt desires TLH BS.  To schedule, and then counsel again at preop. I have had a  careful discussion with this patient about all the options available and the risk/benefits of each. I have fully informed this patient that a hysterectomy may subject her to a variety of discomforts and risks: She understands that most patients have surgery with little difficulty, but problems can happen ranging from minor to fatal. These include nausea, vomiting,  pain, bleeding, infection, poor healing, hernia, wound separation, vaginal cuff separation or formation of adhesions. Unexpected reactions may occur from any drug or anesthetic given. Unintended injury may occur to other pelvic or abdominal structures such as Fallopian tubes, ovaries, bladder, ureter (tube from kidney to bladder), or bowel. Nerves going from the pelvis to the legs may be injured. Any such injury may require immediate or later additional surgery to correct the problem. Excessive blood loss requiring transfusion is very unlikely but possible. Dangerous blood clots may form in the legs or lungs. Physical and sexual activity will be restricted in varying degrees for an indeterminate period of time but most often 2-4 weeks. She understands that the plan is to do this laparoscopically, however, there is a chance that this will need to be performed via a larger incision. She may be hospitalized overnight. Finally, she understands that it is impossible to list every possible undesirable effect and that the condition for which surgery is done is not always cured or significantly improved, and in rare cases may be even worse. I have also counseled her extensively about the pros and cons of ovarian conservation versus removal. Ample time was given to answer all questions.   Annamarie Major, MD, [facility], [facility] 11/11/2019  11:32 AM

## 2019-11-11 NOTE — Patient Instructions (Signed)
Total Laparoscopic Hysterectomy A total laparoscopic hysterectomy is a minimally invasive surgery to remove the uterus and cervix. The fallopian tubes and ovaries can also be removed (bilateral salpingo-oophorectomy) during this surgery, if necessary. This procedure may be done to treat problems such as:  Noncancerous growths in the uterus (uterine fibroids) that cause symptoms.  A condition that causes the lining of the uterus (endometrium) to grow in other areas (endometriosis).  Problems with pelvic support. This is caused by weakened muscles of the pelvis following vaginal childbirth or menopause.  Cancer of the cervix, ovaries, uterus, or endometrium.  Excessive (dysfunctional) uterine bleeding. This surgery is performed by inserting a thin, lighted tube (laparoscope) and surgical instruments into small incisions in the abdomen. The laparoscope sends images to a monitor. The images help the health care provider perform the procedure. After this procedure, you will no longer be able to have a baby, and you will no longer have a menstrual period. Tell a health care provider about:  Any allergies you have.  All medicines you are taking, including vitamins, herbs, eye drops, creams, and over-the-counter medicines.  Any problems you or family members have had with anesthetic medicines.  Any blood disorders you have.  Any surgeries you have had.  Any medical conditions you have.  Whether you are pregnant or may be pregnant. What are the risks? Generally, this is a safe procedure. However, problems may occur, including:  Infection.  Bleeding.  Blood clots in the legs or lungs.  Allergic reactions to medicines.  Damage to other structures or organs.  The risk that the surgery may have to be switched to the regular one in which a large incision is made in the abdomen (abdominal hysterectomy). What happens before the procedure? Staying hydrated Follow instructions from your  health care provider about hydration, which may include:  Up to 2 hours before the procedure - you may continue to drink clear liquids, such as water, clear fruit juice, black coffee, and plain tea Eating and drinking restrictions Follow instructions from your health care provider about eating and drinking, which may include:  8 hours before the procedure - stop eating heavy meals or foods such as meat, fried foods, or fatty foods.  6 hours before the procedure - stop eating light meals or foods, such as toast or cereal.  6 hours before the procedure - stop drinking milk or drinks that contain milk.  2 hours before the procedure - stop drinking clear liquids. Medicines  Ask your health care provider about: ? Changing or stopping your regular medicines. This is especially important if you are taking diabetes medicines or blood thinners. ? Taking over-the-counter medicines, vitamins, herbs, and supplements. ? Taking medicines such as aspirin and ibuprofen. These medicines can thin your blood. Do not take these medicines unless your health care provider tells you to take them.  You may be given antibiotic medicine to help prevent infection.  You may be asked to take laxatives.  You may be given medicines to help prevent nausea and vomiting after the procedure. General instructions  Ask your health care provider how your surgical site will be marked or identified.  You may be asked to shower with a germ-killing soap.  Do not use any products that contain nicotine or tobacco, such as cigarettes and e-cigarettes. If you need help quitting, ask your health care provider.  You may have an exam or testing, such as an ultrasound to determine the size and shape of your pelvic organs.    You may have a blood or urine sample taken.  This procedure can affect the way you feel about yourself. Talk with your health care provider about the physical and emotional changes hysterectomy may  cause.  Plan to have someone take you home from the hospital or clinic.  Plan to have a responsible adult care for you for at least 24 hours after you leave the hospital or clinic. This is important. What happens during the procedure?  To lower your risk of infection: ? Your health care team will wash or sanitize their hands. ? Your skin will be washed with soap. ? Hair may be removed from the surgical area.  An IV will be inserted into one of your veins.  You will be given one or more of the following: ? A medicine to help you relax (sedative). ? A medicine to make you fall asleep (general anesthetic).  You will be given antibiotic medicine through your IV.  A tube may be inserted down your throat to help you breathe during the procedure.  A gas (carbon dioxide) will be used to inflate your abdomen to allow your surgeon to see inside of your abdomen.  Three or four small incisions will be made in your abdomen.  A laparoscope will be inserted into one of your incisions. Surgical instruments will be inserted through the other incisions in order to perform the procedure.  Your uterus and cervix may be removed through your vagina or cut into small pieces and removed through the small incisions. Any other organs that need to be removed will also be removed this way.  Carbon dioxide will be released from inside of your abdomen.  Your incisions will be closed with stitches (sutures).  A bandage (dressing) may be placed over your incisions. The procedure may vary among health care providers and hospitals. What happens after the procedure?  Your blood pressure, heart rate, breathing rate, and blood oxygen level will be monitored until the medicines you were given have worn off.  You will be given medicine for pain and nausea as needed.  Do not drive for 24 hours if you received a sedative. Summary  Total Laparoscopic hysterectomy is a procedure to remove your uterus, cervix and  sometimes the fallopian tubes and ovaries.  This procedure can affect the way you feel about yourself. Talk with your health care provider about the physical and emotional changes hysterectomy may cause.  After this procedure, you will no longer be able to have a baby, and you will no longer have a menstrual period.  You will be given pain medicine to control discomfort after this procedure. This information is not intended to replace advice given to you by your health care provider. Make sure you discuss any questions you have with your health care provider. Document Revised: 05/24/2017 Document Reviewed: 08/22/2016 Elsevier Patient Education  2020 Elsevier Inc.  

## 2019-11-12 ENCOUNTER — Encounter: Payer: Self-pay | Admitting: Family Medicine

## 2019-11-12 ENCOUNTER — Ambulatory Visit (INDEPENDENT_AMBULATORY_CARE_PROVIDER_SITE_OTHER): Payer: Commercial Managed Care - PPO | Admitting: Family Medicine

## 2019-11-12 ENCOUNTER — Other Ambulatory Visit: Payer: Self-pay

## 2019-11-12 VITALS — BP 110/80 | HR 100 | Temp 96.9°F | Resp 16 | Ht 64.0 in | Wt 162.7 lb

## 2019-11-12 DIAGNOSIS — R748 Abnormal levels of other serum enzymes: Secondary | ICD-10-CM

## 2019-11-12 DIAGNOSIS — R05 Cough: Secondary | ICD-10-CM

## 2019-11-12 DIAGNOSIS — Z79899 Other long term (current) drug therapy: Secondary | ICD-10-CM

## 2019-11-12 DIAGNOSIS — N83201 Unspecified ovarian cyst, right side: Secondary | ICD-10-CM

## 2019-11-12 DIAGNOSIS — D72828 Other elevated white blood cell count: Secondary | ICD-10-CM

## 2019-11-12 DIAGNOSIS — F3175 Bipolar disorder, in partial remission, most recent episode depressed: Secondary | ICD-10-CM

## 2019-11-12 DIAGNOSIS — Z1159 Encounter for screening for other viral diseases: Secondary | ICD-10-CM

## 2019-11-12 DIAGNOSIS — E538 Deficiency of other specified B group vitamins: Secondary | ICD-10-CM | POA: Diagnosis not present

## 2019-11-12 DIAGNOSIS — J4540 Moderate persistent asthma, uncomplicated: Secondary | ICD-10-CM

## 2019-11-12 DIAGNOSIS — Z113 Encounter for screening for infections with a predominantly sexual mode of transmission: Secondary | ICD-10-CM

## 2019-11-12 DIAGNOSIS — Z131 Encounter for screening for diabetes mellitus: Secondary | ICD-10-CM

## 2019-11-12 DIAGNOSIS — Z13220 Encounter for screening for lipoid disorders: Secondary | ICD-10-CM

## 2019-11-12 DIAGNOSIS — Z0000 Encounter for general adult medical examination without abnormal findings: Secondary | ICD-10-CM

## 2019-11-12 DIAGNOSIS — R053 Chronic cough: Secondary | ICD-10-CM

## 2019-11-12 LAB — CYTOLOGY - PAP: Diagnosis: NEGATIVE

## 2019-11-12 NOTE — Patient Instructions (Signed)
Preventive Care 21-35 Years Old, Female Preventive care refers to visits with your health care provider and lifestyle choices that can promote health and wellness. This includes:  A yearly physical exam. This may also be called an annual well check.  Regular dental visits and eye exams.  Immunizations.  Screening for certain conditions.  Healthy lifestyle choices, such as eating a healthy diet, getting regular exercise, not using drugs or products that contain nicotine and tobacco, and limiting alcohol use. What can I expect for my preventive care visit? Physical exam Your health care provider will check your:  Height and weight. This may be used to calculate body mass index (BMI), which tells if you are at a healthy weight.  Heart rate and blood pressure.  Skin for abnormal spots. Counseling Your health care provider may ask you questions about your:  Alcohol, tobacco, and drug use.  Emotional well-being.  Home and relationship well-being.  Sexual activity.  Eating habits.  Work and work environment.  Method of birth control.  Menstrual cycle.  Pregnancy history. What immunizations do I need?  Influenza (flu) vaccine  This is recommended every year. Tetanus, diphtheria, and pertussis (Tdap) vaccine  You may need a Td booster every 10 years. Varicella (chickenpox) vaccine  You may need this if you have not been vaccinated. Human papillomavirus (HPV) vaccine  If recommended by your health care provider, you may need three doses over 6 months. Measles, mumps, and rubella (MMR) vaccine  You may need at least one dose of MMR. You may also need a second dose. Meningococcal conjugate (MenACWY) vaccine  One dose is recommended if you are age 19-21 years and a first-year college student living in a residence hall, or if you have one of several medical conditions. You may also need additional booster doses. Pneumococcal conjugate (PCV13) vaccine  You may need  this if you have certain conditions and were not previously vaccinated. Pneumococcal polysaccharide (PPSV23) vaccine  You may need one or two doses if you smoke cigarettes or if you have certain conditions. Hepatitis A vaccine  You may need this if you have certain conditions or if you travel or work in places where you may be exposed to hepatitis A. Hepatitis B vaccine  You may need this if you have certain conditions or if you travel or work in places where you may be exposed to hepatitis B. Haemophilus influenzae type b (Hib) vaccine  You may need this if you have certain conditions. You may receive vaccines as individual doses or as more than one vaccine together in one shot (combination vaccines). Talk with your health care provider about the risks and benefits of combination vaccines. What tests do I need?  Blood tests  Lipid and cholesterol levels. These may be checked every 5 years starting at age 20.  Hepatitis C test.  Hepatitis B test. Screening  Diabetes screening. This is done by checking your blood sugar (glucose) after you have not eaten for a while (fasting).  Sexually transmitted disease (STD) testing.  BRCA-related cancer screening. This may be done if you have a family history of breast, ovarian, tubal, or peritoneal cancers.  Pelvic exam and Pap test. This may be done every 3 years starting at age 21. Starting at age 30, this may be done every 5 years if you have a Pap test in combination with an HPV test. Talk with your health care provider about your test results, treatment options, and if necessary, the need for more tests.   Follow these instructions at home: Eating and drinking   Eat a diet that includes fresh fruits and vegetables, whole grains, lean protein, and low-fat dairy.  Take vitamin and mineral supplements as recommended by your health care provider.  Do not drink alcohol if: ? Your health care provider tells you not to drink. ? You are  pregnant, may be pregnant, or are planning to become pregnant.  If you drink alcohol: ? Limit how much you have to 0-1 drink a day. ? Be aware of how much alcohol is in your drink. In the [place] one drink equals one 12 oz bottle of beer (355 mL), one 5 oz glass of wine (148 mL), or one 1 oz glass of hard liquor (44 mL). Lifestyle  Take daily care of your teeth and gums.  Stay active. Exercise for at least 30 minutes on 5 or more days each week.  Do not use any products that contain nicotine or tobacco, such as cigarettes, e-cigarettes, and chewing tobacco. If you need help quitting, ask your health care provider.  If you are sexually active, practice safe sex. Use a condom or other form of birth control (contraception) in order to prevent pregnancy and STIs (sexually transmitted infections). If you plan to become pregnant, see your health care provider for a preconception visit. What's next?  Visit your health care provider once a year for a well check visit.  Ask your health care provider how often you should have your eyes and teeth checked.  Stay up to date on all vaccines. This information is not intended to replace advice given to you by your health care provider. Make sure you discuss any questions you have with your health care provider. Document Revised: 02/20/2018 Document Reviewed: 02/20/2018 Elsevier Patient Education  2020 [employer].

## 2019-11-12 NOTE — Progress Notes (Signed)
Name: [name]   MRN: [id number]    DOB: 1986-03-15   Date:11/12/2019       Progress Note  Subjective  Chief Complaint  Chief Complaint  Patient presents with  . Annual Exam    HPI  Patient presents for annual CPE and follow up  Chronic cough: she was seen in our office April 20 th  2021 . She states she as a cough since April 2020, but worse since September with multiple visit to UC and EC, she had multiple rounds of prednisone, antibiotics, she had negative CXR, COVID tests. She was sent home on April 4 th because of her persistent cough. We gave her Breo and Tessalon perles, she continues to have a daily cough, she is also using Flonase and Zyrtec daily , she is constantly congested, no wheezing , she also states only has sob after a coughing spell. She has an appointment with pulmonologist on June 1st 2021 and needs an extension for her FMLA forms  RLQ pain: she went to [facility] on [facility]10 for evaluation, she had a CT that showed a right adneal cyst 2.4 cm x 2.2 cm and pelvic US that showed likely follicle small simple cyst, yesterday she saw Dr. Kenton Kingfisher, had a pap smear and is getting evaluation for endometriosis  Bipolar disorder: we will recheck labs, she is under the care of psychiatrist - Dr Montel Culver - [place], also has a therapist Marya Fossa counselor, she states feeling more down because she is worried about losing her job since on [employer] for so long and still not feeling well to go back.   Diet: balanced diet , high in calcium  Exercise: not currently because exercise makes cough spells worse   USPSTF grade A and B recommendations    Office Visit from 07/08/2019 in [facility]  AUDIT-C Score  0     Depression: Phq 9 is  positive Depression screen [facility] 2/9 11/12/2019 10/13/2019 07/08/2019 12/17/2018 12/08/2018  Decreased Interest 2 0 0 2 1  Down, Depressed, Hopeless 1 0 0 3 1  PHQ - 2 Score 3 0 0 5 2  Altered sleeping 1 0 0 3 3  Tired, decreased energy 3  2 0 3 2  Change in appetite 3 1 0 2 3  Feeling bad or failure about yourself  1 0 0 2 3  Trouble concentrating 1 1 0 2 2  Moving slowly or fidgety/restless 1 0 0 0 2  Suicidal thoughts 0 0 0 0 1  PHQ-9 Score 13 4 0 17 18  Difficult doing work/chores Somewhat difficult Not difficult at all Not difficult at all Very difficult Somewhat difficult   Hypertension: BP Readings from Last 3 Encounters:  11/12/19 110/80  11/11/19 100/60  11/02/19 122/85   Obesity: Wt Readings from Last 3 Encounters:  11/12/19 162 lb 11.2 oz (73.8 kg)  11/11/19 164 lb (74.4 kg)  11/02/19 154 lb (69.9 kg)   BMI Readings from Last 3 Encounters:  11/12/19 27.93 kg/m  11/11/19 27.29 kg/m  11/02/19 25.63 kg/m     Hep C Screening: today  STD testing and prevention (HIV/chl/gon/syphilis): today  Intimate partner violence: negative screen  Sexual History (Partners/Practices/Protection from [employer] hx STI/Pregnancy Plans): she has sex with one partner - boyfriend - past 4 years  Pain during Intercourse: present and seeing Dr. Kenton Kingfisher, has endometriosis and planning on having hysterectomy soon  Menstrual History/LMP/Abnormal Bleeding: cycles regular and heavy  Incontinence Symptoms: she has stress incontinence, discussed  kegel exercises   Breast cancer:  - Last Mammogram: not indicated  - BRCA gene screening: N/A  Osteoporosis: Discussed high calcium and vitamin D supplementation, weight bearing exercises  Cervical cancer screening: had it done yesterday by Dr. Kenton Kingfisher   Skin cancer: Discussed monitoring for atypical lesions    Advanced Care Planning: A voluntary discussion about advance care planning including the explanation and discussion of advance directives.  Discussed health care proxy and Living will, and the patient was able to identify a health care proxy as mother  .  Patient does not have a living will at present time.   Glucose: Glucose  Date Value Ref Range Status  01/07/2014 122 (H) 65  - 99 mg/dL Final  01/13/2013 91 65 - 99 mg/dL Final  10/19/2012 89 65 - 99 mg/dL Final   Glucose, Bld  Date Value Ref Range Status  11/02/2019 97 70 - 99 mg/dL Final    Comment:    Glucose reference range applies only to samples taken after fasting for at least 8 hours.  08/04/2019 93 70 - 99 mg/dL Final  04/01/2019 152 (H) 70 - 99 mg/dL Final    Patient Active Problem List   Diagnosis Date Noted  . Radial styloid tenosynovitis 10/13/2019  . Bipolar affective disorder ([place]) 01/26/2015  . Seizures ([place]) 01/12/2015  . Asthma   . Hyperlipidemia   . Bipolar 2 disorder ([place])   . Allergy   . Migraine headache with aura     Past Surgical History:  Procedure Laterality Date  . CARPAL TUNNEL RELEASE  2015   left  . HERNIA REPAIR  2010  . TUBAL LIGATION      Family History  Problem Relation Age of Onset  . Asthma Mother   . Heart disease Mother   . Diabetes Mother   . Hyperlipidemia Mother   . Hypertension Mother   . Mental illness Mother   . Migraines Mother   . Thyroid disease Mother   . Stroke Mother   . Alcohol abuse Mother   . Bipolar disorder Mother   . Anxiety disorder Mother   . COPD Mother   . Hypertension Sister   . Depression Sister   . Drug abuse Sister   . Cancer Maternal Grandmother   . Diabetes Maternal Grandmother   . Heart disease Maternal Grandmother   . Hyperlipidemia Maternal Grandmother   . Hypertension Maternal Grandmother   . Kidney disease Maternal Grandmother   . Heart disease Paternal Grandfather   . Alcohol abuse Paternal Grandfather   . Drug abuse Paternal Grandfather     Social History   Socioeconomic History  . Marital status: Legally Separated    Spouse name: Not on file  . Number of children: 2  . Years of education: Not on file  . Highest education level: GED or equivalent  Occupational History  . Not on file  Tobacco Use  . Smoking status: Former Smoker    Packs/day: 1.00    Years: 0.00    Pack years: 0.00    Types:  Cigarettes    Start date: 09/07/2019  . Smokeless tobacco: Never Used  Substance and Sexual Activity  . Alcohol use: No    Alcohol/week: 0.0 standard drinks  . Drug use: No  . Sexual activity: Yes    Partners: Male    Birth control/protection: Surgical, None  Other Topics Concern  . Not on file  Social History Narrative  . Not on file   Social Determinants  of Health   Financial Resource Strain: Medium Risk  . Difficulty of Paying Living Expenses: Somewhat hard  Food Insecurity: Food Insecurity Present  . Worried About Charity fundraiser in the Last Year: Often true  . Ran Out of Food in the Last Year: Never true  Transportation Needs: No Transportation Needs  . Lack of Transportation (Medical): No  . Lack of Transportation (Non-Medical): No  Physical Activity: Sufficiently Active  . Days of Exercise per Week: 5 days  . Minutes of Exercise per Session: 80 min  Stress: Stress Concern Present  . Feeling of Stress : Very much  Social Connections: Somewhat Isolated  . Frequency of Communication with Friends and Family: More than three times a week  . Frequency of Social Gatherings with Friends and Family: Once a week  . Attends Religious Services: Never  . Active Member of Clubs or Organizations: No  . Attends Archivist Meetings: Never  . Marital Status: Living with partner  Intimate Partner Violence: Not At Risk  . Fear of Current or Ex-Partner: No  . Emotionally Abused: No  . Physically Abused: No  . Sexually Abused: No     Current Outpatient Medications:  .  albuterol (VENTOLIN HFA) 108 (90 Base) MCG/ACT inhaler, Inhale 2 puffs into the lungs every 4 (four) hours as needed for wheezing or shortness of breath., Disp: 18 g, Rfl: 0 .  benzonatate (TESSALON) 200 MG capsule, Take 1 capsule (200 mg total) by mouth 3 (three) times daily as needed for cough., Disp: 100 capsule, Rfl: 0 .  divalproex (DEPAKOTE ER) 500 MG 24 hr tablet, Take 1 tablet (500 mg total) by  mouth at bedtime., Disp: 90 tablet, Rfl: 0 .  FLUoxetine (PROZAC) 20 MG capsule, Take 1 capsule (20 mg total) by mouth daily. Take 1 tablet once daily, Disp: 90 capsule, Rfl: 0 .  fluticasone (FLONASE) 50 MCG/ACT nasal spray, Place 1 spray into both nostrils 2 (two) times daily., Disp: 16 g, Rfl: 0 .  fluticasone furoate-vilanterol (BREO ELLIPTA) 200-25 MCG/INH AEPB, Inhale 1 puff into the lungs daily., Disp: 60 each, Rfl: 2 .  ibuprofen (ADVIL) 600 MG tablet, Take 1 tablet (600 mg total) by mouth every 6 (six) hours as needed., Disp: 30 tablet, Rfl: 0 .  Multiple Vitamins-Minerals (MULTIVITAMIN WITH MINERALS) tablet, Take 1 tablet by mouth daily., Disp: , Rfl:  .  promethazine (PHENERGAN) 12.5 MG tablet, Take 1 tablet (12.5 mg total) by mouth every 6 (six) hours as needed., Disp: 12 tablet, Rfl: 0 .  Spacer/Aero-[place] (AEROCHAMBER PLUS) inhaler, Use as instructed, Disp: 1 each, Rfl: 2 .  traZODone (DESYREL) 100 MG tablet, Take 1 tablet (100 mg total) by mouth at bedtime as needed for sleep., Disp: 90 tablet, Rfl: 0 .  vitamin B-12 (CYANOCOBALAMIN) 1000 MCG tablet, Take 1,000 mcg by mouth daily., Disp: , Rfl:  .  famotidine (PEPCID) 20 MG tablet, Take 1 tablet (20 mg total) by mouth 2 (two) times daily., Disp: 60 tablet, Rfl: 0 .  HYDROcodone-acetaminophen (NORCO) 5-325 MG tablet, Take 1 tablet by mouth every 4 (four) hours as needed for moderate pain. (Patient not taking: Reported on 11/12/2019), Disp: 6 tablet, Rfl: 0  Allergies  Allergen Reactions  . Robitussin (Alcohol Free) [Guaifenesin] Anaphylaxis  . Aspirin Other (See Comments)    Nose bleeds  . Tramadol     Due to Seizures per patient  . Chocolate Flavor Rash  . Codeine Rash  . Strawberry Extract Rash  ROS  Constitutional: Negative for fever or weight change.  Respiratory: Positive for cough and intermittent  shortness of breath.   Cardiovascular: Negative for chest pain or palpitations.  Gastrointestinal:  Negative for abdominal pain, no bowel changes.  Musculoskeletal: Negative for gait problem or joint swelling.  Skin: Negative for rash.  Neurological: Negative for dizziness or headache.  No other specific complaints in a complete review of systems (except as listed in HPI above).  Objective  Vitals:   11/12/19 1037  BP: 110/80  Pulse: 100  Resp: 16  Temp: (!) 96.9 F (36.1 C)  TempSrc: Temporal  SpO2: 99%  Weight: 162 lb 11.2 oz (73.8 kg)  Height: 5' 4" (1.626 m)    Body mass index is 27.93 kg/m.  Physical Exam  Constitutional: Patient appears well-developed and well-nourished. No distress.  HENT: Head: Normocephalic and atraumatic. Ears: B TMs ok, no erythema or effusion; Nose: Not done Mouth/Throat: not done  Eyes: Conjunctivae and EOM are normal. Pupils are equal, round, and reactive to light. No scleral icterus.  Neck: Normal range of motion. Neck supple. No JVD present. No thyromegaly present.  Cardiovascular: Normal rate, regular rhythm and normal heart sounds.  No murmur heard. No BLE edema. Pulmonary/Chest: Effort normal and breath sounds normal. No respiratory distress. Abdominal: Soft. Bowel sounds are normal, no distension. There is no tenderness. no masses Breast: no lumps or masses, no nipple discharge or rashes FEMALE GENITALIA:  Not done, seen by gyn yesterday  RECTAL: no rectal masses or hemorrhoids Musculoskeletal: Normal range of motion, no joint effusions. No gross deformities Neurological: he is alert and oriented to person, place, and time. No cranial nerve deficit. Coordination, balance, strength, speech and gait are normal.  Skin: Skin is warm and dry. No rash noted. No erythema.  Psychiatric: Patient has a normal mood and affect. behavior is normal. Judgment and thought content normal.  Recent Results (from the past 2160 hour(s))  Lipase, blood     Status: None   Collection Time: 11/02/19  6:37 PM  Result Value Ref Range   Lipase 23 11 - 51 U/L     Comment: Performed at [facility], [address] [place], [place] [address]  Comprehensive metabolic panel     Status: Abnormal   Collection Time: 11/02/19  6:37 PM  Result Value Ref Range   Sodium 139 135 - 145 mmol/L   Potassium 4.1 3.5 - 5.1 mmol/L   Chloride 104 98 - 111 mmol/L   CO2 27 22 - 32 mmol/L   Glucose, Bld 97 70 - 99 mg/dL    Comment: Glucose reference range applies only to samples taken after fasting for at least 8 hours.   BUN 11 6 - 20 mg/dL   Creatinine, Ser 0.61 0.44 - 1.00 mg/dL   Calcium 9.4 8.9 - 10.3 mg/dL   Total Protein 7.2 6.5 - 8.1 g/dL   Albumin 4.1 3.5 - 5.0 g/dL   AST 51 (H) 15 - 41 U/L   ALT 60 (H) 0 - 44 U/L   Alkaline Phosphatase 53 38 - 126 U/L   Total Bilirubin 0.5 0.3 - 1.2 mg/dL   GFR calc non Af Amer >60 >60 mL/min   GFR calc Af Amer >60 >60 mL/min   Anion gap 8 5 - 15    Comment: Performed at [facility], [address], [place], [place] [address]  CBC     Status: Abnormal   Collection Time: 11/02/19  6:37 PM  Result Value Ref Range   WBC 10.7 (H) 4.0 - 10.5 K/uL   RBC 4.43 3.87 - 5.11 MIL/uL   Hemoglobin 13.7 12.0 - 15.0 g/dL   HCT 39.3 36.0 - 46.0 %   MCV 88.7 80.0 - 100.0 fL   MCH 30.9 26.0 - 34.0 pg   MCHC 34.9 30.0 - 36.0 g/dL   RDW 12.7 11.5 - 15.5 %   Platelets 231 150 - 400 K/uL   nRBC 0.0 0.0 - 0.2 %    Comment: Performed at [facility], [address] [place], [place] [address]  Urinalysis, Complete w Microscopic     Status: Abnormal   Collection Time: 11/02/19  6:37 PM  Result Value Ref Range   Color, Urine YELLOW (A) YELLOW   APPearance CLEAR (A) CLEAR   Specific Gravity, Urine 1.011 1.005 - 1.030   pH 7.0 5.0 - 8.0   Glucose, UA NEGATIVE NEGATIVE mg/dL   Hgb urine dipstick NEGATIVE NEGATIVE   Bilirubin Urine NEGATIVE NEGATIVE   Ketones, ur NEGATIVE NEGATIVE mg/dL   Protein, ur NEGATIVE NEGATIVE mg/dL   Nitrite NEGATIVE NEGATIVE   Leukocytes,Ua NEGATIVE NEGATIVE   RBC / HPF  0-5 0 - 5 RBC/hpf   WBC, UA 0-5 0 - 5 WBC/hpf   Bacteria, UA NONE SEEN NONE SEEN   Squamous Epithelial / LPF 0-5 0 - 5    Comment: Performed at [facility], [address] [place], [place] [address]  Pregnancy, urine POC     Status: None   Collection Time: 11/02/19  6:41 PM  Result Value Ref Range   Preg Test, Ur NEGATIVE NEGATIVE    Comment:        THE SENSITIVITY OF THIS METHODOLOGY IS >24 mIU/mL   Cytology - PAP     Status: None   Collection Time: 11/11/19 11:26 AM  Result Value Ref Range   Adequacy      Satisfactory for evaluation; transformation zone component PRESENT.   Diagnosis      - Negative for intraepithelial lesion or malignancy (NILM)   Microorganisms      Fungal organisms present consistent with Candida spp.      Fall Risk: Fall Risk  11/12/2019 10/13/2019 07/08/2019 12/17/2018 12/08/2018  Falls in the past year? 0 0 0 0 0  Number falls in past yr: 0 0 0 0 0  Injury with Fall? 0 0 0 0 0  Follow up - - Falls evaluation completed - Falls evaluation completed    Functional Status Survey: Is the patient deaf or have difficulty hearing?: No Does the patient have difficulty seeing, even when wearing glasses/contacts?: No Does the patient have difficulty concentrating, remembering, or making decisions?: Yes Does the patient have difficulty walking or climbing stairs?: No Does the patient have difficulty dressing or bathing?: No Does the patient have difficulty doing errands alone such as visiting a doctor's office or shopping?: No   Assessment & Plan  1. Well adult exam   2. Chronic cough  Waiting to see pulmonologist on June 1st, still having continuous cough   3. Low vitamin B12 level  - Vitamin B12  4. Long-term use of high-risk medication  - COMPLETE METABOLIC PANEL WITH GFR - CBC with Differential/Platelet - VITAMIN D 25 Hydroxy (Vit-D Deficiency, Fractures)  5. Other elevated white blood cell (WBC) count   6. Diabetes mellitus  screening  - Hemoglobin A1c  7. Lipid screening  - Lipid panel  8. Elevated liver enzymes  -  COMPLETE METABOLIC PANEL WITH GFR  9. Routine screening for STI (sexually transmitted infection)  - HIV Antibody (routine testing w rflx) - RPR  10. Need for hepatitis C screening test  - Hepatitis C antibody  11. Bipolar disorder, in partial remission, most recent episode depressed ([place])  Contact psychiatrist, phq 9 is worse  12. Asthma, moderate persistent, poorly-controlled  Follow up with pulmonologist   13. Ovarian cyst, right  Keep follow up with Dr. Kenton Kingfisher  -USPSTF grade A and B recommendations reviewed with patient; age-appropriate recommendations, preventive care, screening tests, etc discussed and encouraged; healthy living encouraged; see AVS for patient education given to patient -Discussed importance of 150 minutes of physical activity weekly, eat two servings of fish weekly, eat one serving of tree nuts ( cashews, pistachios, pecans, almonds.Marland Kitchen) every other day, eat 6 servings of fruit/vegetables daily and drink plenty of water and avoid sweet beverages.

## 2019-11-13 LAB — COMPLETE METABOLIC PANEL WITH GFR
AG Ratio: 1.8 (calc) (ref 1.0–2.5)
ALT: 18 U/L (ref 6–29)
AST: 17 U/L (ref 10–30)
Albumin: 4.4 g/dL (ref 3.6–5.1)
Alkaline phosphatase (APISO): 60 U/L (ref 31–125)
BUN: 11 mg/dL (ref 7–25)
CO2: 27 mmol/L (ref 20–32)
Calcium: 9.8 mg/dL (ref 8.6–10.2)
Chloride: 103 mmol/L (ref 98–110)
Creat: 0.6 mg/dL (ref 0.50–1.10)
GFR, Est African American: 138 mL/min/{1.73_m2} (ref >=60)
GFR, Est Non African American: 119 mL/min/{1.73_m2} (ref >=60)
Globulin: 2.4 g/dL (calc) (ref 1.9–3.7)
Glucose, Bld: 95 mg/dL (ref 65–99)
Potassium: 4.3 mmol/L (ref 3.5–5.3)
Sodium: 141 mmol/L (ref 135–146)
Total Bilirubin: 0.2 mg/dL (ref 0.2–1.2)
Total Protein: 6.8 g/dL (ref 6.1–8.1)

## 2019-11-13 LAB — CBC WITH DIFFERENTIAL/PLATELET
Absolute Monocytes: 589 cells/uL (ref 200–950)
Basophils Absolute: 29 cells/uL (ref 0–200)
Basophils Relative: 0.3 %
Eosinophils Absolute: 171 cells/uL (ref 15–500)
Eosinophils Relative: 1.8 %
HCT: 39 % (ref 35.0–45.0)
Hemoglobin: 13 g/dL (ref 11.7–15.5)
Lymphs Abs: 2613 cells/uL (ref 850–3900)
MCH: 30.7 pg (ref 27.0–33.0)
MCHC: 33.3 g/dL (ref 32.0–36.0)
MCV: 92.2 fL (ref 80.0–100.0)
MPV: 10.6 fL (ref 7.5–12.5)
Monocytes Relative: 6.2 %
Neutro Abs: 6099 cells/uL (ref 1500–7800)
Neutrophils Relative %: 64.2 %
Platelets: 347 10*3/uL (ref 140–400)
RBC: 4.23 10*6/uL (ref 3.80–5.10)
RDW: 12.1 % (ref 11.0–15.0)
Total Lymphocyte: 27.5 %
WBC: 9.5 10*3/uL (ref 3.8–10.8)

## 2019-11-13 LAB — RPR: RPR Ser Ql: NONREACTIVE

## 2019-11-13 LAB — LIPID PANEL
Cholesterol: 192 mg/dL (ref <200)
HDL: 46 mg/dL — ABNORMAL LOW (ref >=50)
LDL Cholesterol (Calc): 126 mg/dL (calc) — ABNORMAL HIGH
Non-HDL Cholesterol (Calc): 146 mg/dL (calc) — ABNORMAL HIGH (ref <130)
Total CHOL/HDL Ratio: 4.2 (calc) (ref <5.0)
Triglycerides: 102 mg/dL (ref <150)

## 2019-11-13 LAB — HEPATITIS C ANTIBODY
Hepatitis C Ab: NONREACTIVE
SIGNAL TO CUT-OFF: 0.01 (ref <1.00)

## 2019-11-13 LAB — HEMOGLOBIN A1C
Hgb A1c MFr Bld: 5.1 % of total Hgb (ref <5.7)
Mean Plasma Glucose: 100 (calc)
eAG (mmol/L): 5.5 (calc)

## 2019-11-13 LAB — HIV ANTIBODY (ROUTINE TESTING W REFLEX): HIV 1&2 Ab, 4th Generation: NONREACTIVE

## 2019-11-13 LAB — VITAMIN D 25 HYDROXY (VIT D DEFICIENCY, FRACTURES): Vit D, 25-Hydroxy: 39 ng/mL (ref 30–100)

## 2019-11-13 LAB — VITAMIN B12: Vitamin B-12: 1412 pg/mL — ABNORMAL HIGH (ref 200–1100)

## 2019-11-16 ENCOUNTER — Telehealth: Payer: Self-pay | Admitting: Obstetrics & Gynecology

## 2019-11-16 NOTE — Telephone Encounter (Signed)
-----   Message from Nadara Mustard, MD sent at 11/11/2019 11:42 AM EDT ----- Regarding: Surgery Please schedule preop after surgery is scheduled so I can discuss surgery w her again and consent her  Surgery Booking Request Patient Full Name:  [name]  MRN: [id number]  DOB: 27-May-1986  Surgeon: Letitia Libra, MD  Requested Surgery Date and Time: June Primary Diagnosis AND Code:   ICD-10-CM  1. Dysmenorrhea  N94.6  2. Pelvic pain  R10.2  3. Dyspareunia due to medical condition in female  N94.19  4. Right ovarian cyst  N83.201  Secondary Diagnosis and Code:  Surgical Procedure: TLH/BS L&D Notification: No Admission Status: same day surgery Length of Surgery: 1 hr Special Case Needs: No H&P: Yes Phone Interview???:  Yes Interpreter: No Language:  Medical Clearance:  No Special Scheduling Instructions: non Any known health/anesthesia issues, diabetes, sleep apnea, latex allergy, defibrillator/pacemaker?: No Acuity: P3   (P1 highest, P2 delay may cause harm, P3 low, elective gyn, P4 lowest)

## 2019-11-16 NOTE — Telephone Encounter (Signed)
Pt called in wanting to go ahead and schedule TLH/BS as soon as we could.   DOS 6/4 TLH/BS  H&P 5/27 @ 8:00  Covid 6/3 8-10:30, [address], drive up and wear mask, Adv pt to quar until [employer]  Pre-admit phone appt to be requested and will be updated to MyChart and on H&P paperwork  Adv she may rec phone calls from [employer] and pre-service ctr  [facility] as primary ins.

## 2019-11-17 ENCOUNTER — Telehealth: Payer: Self-pay | Admitting: Family Medicine

## 2019-11-17 NOTE — Telephone Encounter (Signed)
Copied from CRM [id number]. Topic: General - Other >> Nov 17, 2019 10:18 AM Tamela Oddi wrote: Reason for CRM: Called to inform the office that they only received the first page of office notes for May 20th via fax.  Would like for it to be refaxed in its entirety at the following fax# [phone], CB# [phone].

## 2019-11-18 NOTE — Telephone Encounter (Signed)
Patient stated she received her paperwork

## 2019-11-18 NOTE — Telephone Encounter (Signed)
[name] following up, states they have not received fax.

## 2019-11-19 ENCOUNTER — Encounter: Payer: Self-pay | Admitting: Obstetrics & Gynecology

## 2019-11-19 ENCOUNTER — Other Ambulatory Visit: Payer: Self-pay

## 2019-11-19 ENCOUNTER — Ambulatory Visit (INDEPENDENT_AMBULATORY_CARE_PROVIDER_SITE_OTHER): Payer: Commercial Managed Care - PPO | Admitting: Obstetrics & Gynecology

## 2019-11-19 VITALS — BP 100/60 | Ht 64.0 in | Wt 165.0 lb

## 2019-11-19 DIAGNOSIS — R102 Pelvic and perineal pain: Secondary | ICD-10-CM

## 2019-11-19 DIAGNOSIS — N946 Dysmenorrhea, unspecified: Secondary | ICD-10-CM

## 2019-11-19 DIAGNOSIS — N9419 Other specified dyspareunia: Secondary | ICD-10-CM

## 2019-11-19 NOTE — Patient Instructions (Signed)
PRE ADMISSION TESTING For Covid, prior to procedure Wednesday 9:00-10:00 [facility] entrance (drive up)  Results in 40-98 hours You will not receive notification if test results are negative. If positive for Covid19, your provider will notify you by phone, with additional instructions.   Total Laparoscopic Hysterectomy, Care After This sheet gives you information about how to care for yourself after your procedure. Your health care provider may also give you more specific instructions. If you have problems or questions, contact your health care provider. What can I expect after the procedure? After the procedure, it is common to have:  Pain and bruising around your incisions.  A sore throat, if a breathing tube was used during surgery.  Fatigue.  Poor appetite.  Less interest in sex. If your ovaries were also removed, it is also common to have symptoms of menopause such as hot flashes, night sweats, and lack of sleep (insomnia). Follow these instructions at home: Bathing  Do not take baths, swim, or use a hot tub until your health care provider approves. You may need to only take showers for 2-3 weeks.  Keep your bandage (dressing) dry until your health care provider says it can be removed. Incision care   Follow instructions from your health care provider about how to take care of your incisions. Make sure you: ? Wash your hands with soap and water before you change your dressing. If soap and water are not available, use hand sanitizer. ? Change your dressing as told by your health care provider. ? Leave stitches (sutures), skin glue, or adhesive strips in place. These skin closures may need to stay in place for 2 weeks or longer. If adhesive strip edges start to loosen and curl up, you may trim the loose edges. Do not remove adhesive strips completely unless your health care provider tells you to do that.  Check your incision area every day for signs of infection.  Check for: ? Redness, swelling, or pain. ? Fluid or blood. ? Warmth. ? Pus or a bad smell. Activity  Get plenty of rest and sleep.  Do not lift anything that is heavier than 10 lbs (4.5 kg) for one month after surgery, or as long as told by your health care provider.  Do not drive or use heavy machinery while taking prescription pain medicine.  Do not drive for 24 hours if you were given a medicine to help you relax (sedative).  Return to your normal activities as told by your health care provider. Ask your health care provider what activities are safe for you. Lifestyle   Do not use any products that contain nicotine or tobacco, such as cigarettes and e-cigarettes. These can delay healing. If you need help quitting, ask your health care provider.  Do not drink alcohol until your health care provider approves. General instructions  Do not douche, use tampons, or have sex for at least 6 weeks, or as told by your health care provider.  Take over-the-counter and prescription medicines only as told by your health care provider.  To monitor yourself for a fever, take your temperature at least once a day during recovery.  If you struggle with physical or emotional changes after your procedure, speak with your health care provider or a therapist.  To prevent or treat constipation while you are taking prescription pain medicine, your health care provider may recommend that you: ? Drink enough fluid to keep your urine clear or pale yellow. ? Take over-the-counter or prescription medicines. ?  Eat foods that are high in fiber, such as fresh fruits and vegetables, whole grains, and beans. ? Limit foods that are high in fat and processed sugars, such as fried and sweet foods.  Keep all follow-up visits as told by your health care provider. This is important. Contact a health care provider if:  You have chills or a fever.  You have redness, swelling, or pain around an incision.  You  have fluid or blood coming from an incision.  Your incision feels warm to the touch.  You have pus or a bad smell coming from an incision.  An incision breaks open.  You feel dizzy or light-headed.  You have pain or bleeding when you urinate.  You have diarrhea, nausea, or vomiting that does not go away.  You have abnormal vaginal discharge.  You have a rash.  You have pain that does not get better with medicine. Get help right away if:  You have a fever and your symptoms suddenly get worse.  You have severe abdominal pain.  You have chest pain.  You have shortness of breath.  You faint.  You have pain, swelling, or redness on your leg.  You have heavy vaginal bleeding with blood clots. Summary  After the procedure it is common to have abdominal pain. Your provider will give you medication for this.  Do not take baths, swim, or use a hot tub until your health care provider approves.  Do not lift anything that is heavier than 10 lbs (4.5 kg) for one month after surgery, or as long as told by your health care provider.  Notify your provider if you have any signs or symptoms of infection after the procedure. This information is not intended to replace advice given to you by your health care provider. Make sure you discuss any questions you have with your health care provider. Document Revised: 05/24/2017 Document Reviewed: 08/22/2016 Elsevier Patient Education  2020 [employer].

## 2019-11-19 NOTE — H&P (View-Only) (Signed)
PRE-OPERATIVE HISTORY AND PHYSICAL EXAM  HPI:  [name] is a 34 y.o. [id number] Patient's last menstrual period was 11/09/2019.; she is being admitted for surgery related to pelvic pain.  Patient reports Dysmenorrhea and Recent RLQ pain related to a Right Ovarian Cyst found in ER by [place] when she was seen for that recently.  She has had RIGHT SIDED PAIN for several weeks, which is a new problem for her, with associated n/v/d for 2 days now resolved.  Pain is still present but more midline and associated w her period pain.  She has monthly cycles and bleeds for 4 days and has severe pain for 4 days, sometimes having to miss work.  Dyspareunia:  Symptoms onset was greateer than a year ago and symptoms have been worsening.  Pain is with contact and deep, and often she cannot walk or function after sex.    The patient is not menopausal.  She is not currently on any HRT or hormonal medications.  Prior surgery for ectopic (right) and surgery for BTL, and prior NSVD x2.  No desire for future fertility.  Prior right inguinal hernia repair w mesh as well.  Pain is most pronounced with initial penetration, with deep penetration and during coitus and post coital.  She has postcoital spotting.  Last pap smear on (many years ago) was no abnormalities.  She does not a history of sexually transmitted infections.  Associated symptoms include none.  She denies recent antibiotic exposure, denies changes in soaps, detergents coinciding with the onset of her symptoms.  She has not previously self treated or been under treatment by another provider for these symptoms.   PMHx: Past Medical History:  Diagnosis Date  . Allergy   . Anxiety   . Asthma   . Bipolar 1 disorder (HCC)   . Bipolar disorder (HCC)   . Depression   . Ectopic pregnancy   . GERD (gastroesophageal reflux disease)   . History of concussion 2016  . Hyperlipidemia   . Migraine   . MRSA (methicillin resistant Staphylococcus aureus)   .  Pseudoseizures   . Scoliosis   . Scoliosis    Past Surgical History:  Procedure Laterality Date  . CARPAL TUNNEL RELEASE  2015   left  . HERNIA REPAIR  2010  . TUBAL LIGATION     Family History  Problem Relation Age of Onset  . Asthma Mother   . Heart disease Mother   . Diabetes Mother   . Hyperlipidemia Mother   . Hypertension Mother   . Mental illness Mother   . Migraines Mother   . Thyroid disease Mother   . Stroke Mother   . Alcohol abuse Mother   . Bipolar disorder Mother   . Anxiety disorder Mother   . COPD Mother   . Hypertension Sister   . Depression Sister   . Drug abuse Sister   . Cancer Maternal Grandmother   . Diabetes Maternal Grandmother   . Heart disease Maternal Grandmother   . Hyperlipidemia Maternal Grandmother   . Hypertension Maternal Grandmother   . Kidney disease Maternal Grandmother   . Heart disease Paternal Grandfather   . Alcohol abuse Paternal Grandfather   . Drug abuse Paternal Grandfather    Social History   Tobacco Use  . Smoking status: Former Smoker    Packs/day: 1.00    Years: 0.00    Pack years: 0.00    Types: Cigarettes    Start date: 09/07/2019  .  Smokeless tobacco: Never Used  Substance Use Topics  . Alcohol use: No    Alcohol/week: 0.0 standard drinks  . Drug use: No    Current Outpatient Medications:  .  albuterol (VENTOLIN HFA) 108 (90 Base) MCG/ACT inhaler, Inhale 2 puffs into the lungs every 4 (four) hours as needed for wheezing or shortness of breath., Disp: 18 g, Rfl: 0 .  benzonatate (TESSALON) 200 MG capsule, Take 1 capsule (200 mg total) by mouth 3 (three) times daily as needed for cough., Disp: 100 capsule, Rfl: 0 .  divalproex (DEPAKOTE ER) 500 MG 24 hr tablet, Take 1 tablet (500 mg total) by mouth at bedtime., Disp: 90 tablet, Rfl: 0 .  famotidine (PEPCID) 20 MG tablet, Take 1 tablet (20 mg total) by mouth 2 (two) times daily., Disp: 60 tablet, Rfl: 0 .  FLUoxetine (PROZAC) 20 MG capsule, Take 1 capsule (20  mg total) by mouth daily. Take 1 tablet once daily (Patient taking differently: Take 20 mg by mouth daily. ), Disp: 90 capsule, Rfl: 0 .  fluticasone (FLONASE) 50 MCG/ACT nasal spray, Place 1 spray into both nostrils 2 (two) times daily. (Patient not taking: Reported on 11/18/2019), Disp: 16 g, Rfl: 0 .  fluticasone furoate-vilanterol (BREO ELLIPTA) 200-25 MCG/INH AEPB, Inhale 1 puff into the lungs daily., Disp: 60 each, Rfl: 2 .  HYDROcodone-acetaminophen (NORCO) 5-325 MG tablet, Take 1 tablet by mouth every 4 (four) hours as needed for moderate pain. (Patient not taking: Reported on 11/12/2019), Disp: 6 tablet, Rfl: 0 .  ibuprofen (ADVIL) 600 MG tablet, Take 1 tablet (600 mg total) by mouth every 6 (six) hours as needed. (Patient not taking: Reported on 11/18/2019), Disp: 30 tablet, Rfl: 0 .  Multiple Vitamins-Minerals (MULTIVITAMIN WITH MINERALS) tablet, Take 1 tablet by mouth daily., Disp: , Rfl:  .  promethazine (PHENERGAN) 12.5 MG tablet, Take 1 tablet (12.5 mg total) by mouth every 6 (six) hours as needed. (Patient not taking: Reported on 11/18/2019), Disp: 12 tablet, Rfl: 0 .  Spacer/Aero-Holding Chambers (AEROCHAMBER PLUS) inhaler, Use as instructed, Disp: 1 each, Rfl: 2 .  traZODone (DESYREL) 100 MG tablet, Take 1 tablet (100 mg total) by mouth at bedtime as needed for sleep., Disp: 90 tablet, Rfl: 0 .  vitamin B-12 (CYANOCOBALAMIN) 1000 MCG tablet, Take 1,000 mcg by mouth daily., Disp: , Rfl:  Allergies: Robitussin (alcohol free) [guaifenesin], Aspirin, Tramadol, Chocolate flavor, Codeine, and Strawberry extract  Review of Systems  Constitutional: Negative for chills, fever and malaise/fatigue.  HENT: Negative for congestion, sinus pain and sore throat.   Eyes: Negative for blurred vision and pain.  Respiratory: Negative for cough and wheezing.   Cardiovascular: Negative for chest pain and leg swelling.  Gastrointestinal: Positive for abdominal pain. Negative for constipation, diarrhea,  heartburn, nausea and vomiting.  Genitourinary: Negative for dysuria, frequency, hematuria and urgency.  Musculoskeletal: Negative for back pain, joint pain, myalgias and neck pain.  Skin: Negative for itching and rash.  Neurological: Negative for dizziness, tremors and weakness.  Endo/Heme/Allergies: Does not bruise/bleed easily.  Psychiatric/Behavioral: Negative for depression. The patient is not nervous/anxious and does not have insomnia.     Objective: LMP 11/09/2019  There were no vitals filed for this visit. Physical Exam Constitutional:      General: She is not in acute distress.    Appearance: She is well-developed.  HENT:     Head: Normocephalic and atraumatic. No laceration.     Right Ear: Hearing normal.     Left Ear: Hearing  normal.     Mouth/Throat:     Pharynx: Uvula midline.  Eyes:     Pupils: Pupils are equal, round, and reactive to light.  Neck:     Thyroid: No thyromegaly.  Cardiovascular:     Rate and Rhythm: Normal rate and regular rhythm.     Heart sounds: No murmur. No friction rub. No gallop.   Pulmonary:     Effort: Pulmonary effort is normal. No respiratory distress.     Breath sounds: Normal breath sounds. No wheezing.  Chest:     Breasts:        Right: No mass, skin change or tenderness.        Left: No mass, skin change or tenderness.  Abdominal:     General: Bowel sounds are normal. There is no distension.     Palpations: Abdomen is soft.     Tenderness: There is no abdominal tenderness. There is no rebound.  Musculoskeletal:        General: Normal range of motion.     Cervical back: Normal range of motion and neck supple.  Neurological:     Mental Status: She is alert and oriented to person, place, and time.     Cranial Nerves: No cranial nerve deficit.  Skin:    General: Skin is warm and dry.  Psychiatric:        Judgment: Judgment normal.  Vitals reviewed.     Assessment: 1. Pelvic pain   2. Dyspareunia due to medical condition in  female   3. Dysmenorrhea   Discussed surgery(TLH, BS, Cysto) w preservation of ovaries (May remove cyst based on surgical findings)  Return to work 6/28 discussed (3 weeks, heavy lifting job)  I have had a careful discussion with this patient about all the options available and the risk/benefits of each. I have fully informed this patient that surgery may subject her to a variety of discomforts and risks: She understands that most patients have surgery with little difficulty, but problems can happen ranging from minor to fatal. These include nausea, vomiting, pain, bleeding, infection, poor healing, hernia, or formation of adhesions. Unexpected reactions may occur from any drug or anesthetic given. Unintended injury may occur to other pelvic or abdominal structures such as Fallopian tubes, ovaries, bladder, ureter (tube from kidney to bladder), or bowel. Nerves going from the pelvis to the legs may be injured. Any such injury may require immediate or later additional surgery to correct the problem. Excessive blood loss requiring transfusion is very unlikely but possible. Dangerous blood clots may form in the legs or lungs. Physical and sexual activity will be restricted in varying degrees for an indeterminate period of time but most often 2-6 weeks.  Finally, she understands that it is impossible to list every possible undesirable effect and that the condition for which surgery is done is not always cured or significantly improved, and in rare cases may be even worse.Ample time was given to answer all questions.  Annamarie Major, MD, [facility], [facility] 11/19/2019  7:59 AM

## 2019-11-19 NOTE — Progress Notes (Signed)
   PRE-OPERATIVE HISTORY AND PHYSICAL EXAM  HPI:  [name] is a 33 y.o. [id number] Patient's last menstrual period was 11/09/2019.; she is being admitted for surgery related to pelvic pain.  Patient reports Dysmenorrhea and Recent RLQ pain related to a Right Ovarian Cyst found in ER by [place] when she was seen for that recently.  She has had RIGHT SIDED PAIN for several weeks, which is a new problem for her, with associated n/v/d for 2 days now resolved.  Pain is still present but more midline and associated w her period pain.  She has monthly cycles and bleeds for 4 days and has severe pain for 4 days, sometimes having to miss work.  Dyspareunia:  Symptoms onset was greateer than a year ago and symptoms have been worsening.  Pain is with contact and deep, and often she cannot walk or function after sex.    The patient is not menopausal.  She is not currently on any HRT or hormonal medications.  Prior surgery for ectopic (right) and surgery for BTL, and prior NSVD x2.  No desire for future fertility.  Prior right inguinal hernia repair w mesh as well.  Pain is most pronounced with initial penetration, with deep penetration and during coitus and post coital.  She has postcoital spotting.  Last pap smear on (many years ago) was no abnormalities.  She does not a history of sexually transmitted infections.  Associated symptoms include none.  She denies recent antibiotic exposure, denies changes in soaps, detergents coinciding with the onset of her symptoms.  She has not previously self treated or been under treatment by another provider for these symptoms.   PMHx: Past Medical History:  Diagnosis Date  . Allergy   . Anxiety   . Asthma   . Bipolar 1 disorder (HCC)   . Bipolar disorder (HCC)   . Depression   . Ectopic pregnancy   . GERD (gastroesophageal reflux disease)   . History of concussion 2016  . Hyperlipidemia   . Migraine   . MRSA (methicillin resistant Staphylococcus aureus)   .  Pseudoseizures   . Scoliosis   . Scoliosis    Past Surgical History:  Procedure Laterality Date  . CARPAL TUNNEL RELEASE  2015   left  . HERNIA REPAIR  2010  . TUBAL LIGATION     Family History  Problem Relation Age of Onset  . Asthma Mother   . Heart disease Mother   . Diabetes Mother   . Hyperlipidemia Mother   . Hypertension Mother   . Mental illness Mother   . Migraines Mother   . Thyroid disease Mother   . Stroke Mother   . Alcohol abuse Mother   . Bipolar disorder Mother   . Anxiety disorder Mother   . COPD Mother   . Hypertension Sister   . Depression Sister   . Drug abuse Sister   . Cancer Maternal Grandmother   . Diabetes Maternal Grandmother   . Heart disease Maternal Grandmother   . Hyperlipidemia Maternal Grandmother   . Hypertension Maternal Grandmother   . Kidney disease Maternal Grandmother   . Heart disease Paternal Grandfather   . Alcohol abuse Paternal Grandfather   . Drug abuse Paternal Grandfather    Social History   Tobacco Use  . Smoking status: Former Smoker    Packs/day: 1.00    Years: 0.00    Pack years: 0.00    Types: Cigarettes    Start date: 09/07/2019  .   Smokeless tobacco: Never Used  Substance Use Topics  . Alcohol use: No    Alcohol/week: 0.0 standard drinks  . Drug use: No    Current Outpatient Medications:  .  albuterol (VENTOLIN HFA) 108 (90 Base) MCG/ACT inhaler, Inhale 2 puffs into the lungs every 4 (four) hours as needed for wheezing or shortness of breath., Disp: 18 g, Rfl: 0 .  benzonatate (TESSALON) 200 MG capsule, Take 1 capsule (200 mg total) by mouth 3 (three) times daily as needed for cough., Disp: 100 capsule, Rfl: 0 .  divalproex (DEPAKOTE ER) 500 MG 24 hr tablet, Take 1 tablet (500 mg total) by mouth at bedtime., Disp: 90 tablet, Rfl: 0 .  famotidine (PEPCID) 20 MG tablet, Take 1 tablet (20 mg total) by mouth 2 (two) times daily., Disp: 60 tablet, Rfl: 0 .  FLUoxetine (PROZAC) 20 MG capsule, Take 1 capsule (20  mg total) by mouth daily. Take 1 tablet once daily (Patient taking differently: Take 20 mg by mouth daily. ), Disp: 90 capsule, Rfl: 0 .  fluticasone (FLONASE) 50 MCG/ACT nasal spray, Place 1 spray into both nostrils 2 (two) times daily. (Patient not taking: Reported on 11/18/2019), Disp: 16 g, Rfl: 0 .  fluticasone furoate-vilanterol (BREO ELLIPTA) 200-25 MCG/INH AEPB, Inhale 1 puff into the lungs daily., Disp: 60 each, Rfl: 2 .  HYDROcodone-acetaminophen (NORCO) 5-325 MG tablet, Take 1 tablet by mouth every 4 (four) hours as needed for moderate pain. (Patient not taking: Reported on 11/12/2019), Disp: 6 tablet, Rfl: 0 .  ibuprofen (ADVIL) 600 MG tablet, Take 1 tablet (600 mg total) by mouth every 6 (six) hours as needed. (Patient not taking: Reported on 11/18/2019), Disp: 30 tablet, Rfl: 0 .  Multiple Vitamins-Minerals (MULTIVITAMIN WITH MINERALS) tablet, Take 1 tablet by mouth daily., Disp: , Rfl:  .  promethazine (PHENERGAN) 12.5 MG tablet, Take 1 tablet (12.5 mg total) by mouth every 6 (six) hours as needed. (Patient not taking: Reported on 11/18/2019), Disp: 12 tablet, Rfl: 0 .  Spacer/Aero-Holding Chambers (AEROCHAMBER PLUS) inhaler, Use as instructed, Disp: 1 each, Rfl: 2 .  traZODone (DESYREL) 100 MG tablet, Take 1 tablet (100 mg total) by mouth at bedtime as needed for sleep., Disp: 90 tablet, Rfl: 0 .  vitamin B-12 (CYANOCOBALAMIN) 1000 MCG tablet, Take 1,000 mcg by mouth daily., Disp: , Rfl:  Allergies: Robitussin (alcohol free) [guaifenesin], Aspirin, Tramadol, Chocolate flavor, Codeine, and Strawberry extract  Review of Systems  Constitutional: Negative for chills, fever and malaise/fatigue.  HENT: Negative for congestion, sinus pain and sore throat.   Eyes: Negative for blurred vision and pain.  Respiratory: Negative for cough and wheezing.   Cardiovascular: Negative for chest pain and leg swelling.  Gastrointestinal: Positive for abdominal pain. Negative for constipation, diarrhea,  heartburn, nausea and vomiting.  Genitourinary: Negative for dysuria, frequency, hematuria and urgency.  Musculoskeletal: Negative for back pain, joint pain, myalgias and neck pain.  Skin: Negative for itching and rash.  Neurological: Negative for dizziness, tremors and weakness.  Endo/Heme/Allergies: Does not bruise/bleed easily.  Psychiatric/Behavioral: Negative for depression. The patient is not nervous/anxious and does not have insomnia.     Objective: LMP 11/09/2019  There were no vitals filed for this visit. Physical Exam Constitutional:      General: She is not in acute distress.    Appearance: She is well-developed.  HENT:     Head: Normocephalic and atraumatic. No laceration.     Right Ear: Hearing normal.     Left Ear: Hearing  normal.     Mouth/Throat:     Pharynx: Uvula midline.  Eyes:     Pupils: Pupils are equal, round, and reactive to light.  Neck:     Thyroid: No thyromegaly.  Cardiovascular:     Rate and Rhythm: Normal rate and regular rhythm.     Heart sounds: No murmur. No friction rub. No gallop.   Pulmonary:     Effort: Pulmonary effort is normal. No respiratory distress.     Breath sounds: Normal breath sounds. No wheezing.  Chest:     Breasts:        Right: No mass, skin change or tenderness.        Left: No mass, skin change or tenderness.  Abdominal:     General: Bowel sounds are normal. There is no distension.     Palpations: Abdomen is soft.     Tenderness: There is no abdominal tenderness. There is no rebound.  Musculoskeletal:        General: Normal range of motion.     Cervical back: Normal range of motion and neck supple.  Neurological:     Mental Status: She is alert and oriented to person, place, and time.     Cranial Nerves: No cranial nerve deficit.  Skin:    General: Skin is warm and dry.  Psychiatric:        Judgment: Judgment normal.  Vitals reviewed.     Assessment: 1. Pelvic pain   2. Dyspareunia due to medical condition in  female   3. Dysmenorrhea   Discussed surgery(TLH, BS, Cysto) w preservation of ovaries (May remove cyst based on surgical findings)  Return to work 6/28 discussed (3 weeks, heavy lifting job)  I have had a careful discussion with this patient about all the options available and the risk/benefits of each. I have fully informed this patient that surgery may subject her to a variety of discomforts and risks: She understands that most patients have surgery with little difficulty, but problems can happen ranging from minor to fatal. These include nausea, vomiting, pain, bleeding, infection, poor healing, hernia, or formation of adhesions. Unexpected reactions may occur from any drug or anesthetic given. Unintended injury may occur to other pelvic or abdominal structures such as Fallopian tubes, ovaries, bladder, ureter (tube from kidney to bladder), or bowel. Nerves going from the pelvis to the legs may be injured. Any such injury may require immediate or later additional surgery to correct the problem. Excessive blood loss requiring transfusion is very unlikely but possible. Dangerous blood clots may form in the legs or lungs. Physical and sexual activity will be restricted in varying degrees for an indeterminate period of time but most often 2-6 weeks.  Finally, she understands that it is impossible to list every possible undesirable effect and that the condition for which surgery is done is not always cured or significantly improved, and in rare cases may be even worse.Ample time was given to answer all questions.  Annamarie Major, MD, [facility], [facility] 11/19/2019  7:59 AM

## 2019-11-24 ENCOUNTER — Encounter
Admission: RE | Admit: 2019-11-24 | Discharge: 2019-11-24 | Disposition: A | Discharge status: Home / Self Care | Payer: Commercial Managed Care - PPO | Source: Ambulatory Visit | Attending: Obstetrics & Gynecology | Admitting: Obstetrics & Gynecology

## 2019-11-24 ENCOUNTER — Other Ambulatory Visit: Payer: Self-pay

## 2019-11-24 ENCOUNTER — Other Ambulatory Visit
Admission: RE | Admit: 2019-11-24 | Discharge: 2019-11-24 | Disposition: A | Discharge status: Home / Self Care | Payer: Commercial Managed Care - PPO | Source: Ambulatory Visit | Attending: Pulmonary Disease | Admitting: Pulmonary Disease

## 2019-11-24 ENCOUNTER — Encounter: Payer: Self-pay | Admitting: Pulmonary Disease

## 2019-11-24 ENCOUNTER — Ambulatory Visit: Payer: Commercial Managed Care - PPO | Admitting: Pulmonary Disease

## 2019-11-24 VITALS — BP 110/80 | HR 89 | Temp 97.7°F | Ht 65.0 in | Wt 166.6 lb

## 2019-11-24 DIAGNOSIS — K219 Gastro-esophageal reflux disease without esophagitis: Secondary | ICD-10-CM

## 2019-11-24 DIAGNOSIS — R059 Cough, unspecified: Secondary | ICD-10-CM

## 2019-11-24 DIAGNOSIS — F17201 Nicotine dependence, unspecified, in remission: Secondary | ICD-10-CM

## 2019-11-24 DIAGNOSIS — J45909 Unspecified asthma, uncomplicated: Secondary | ICD-10-CM

## 2019-11-24 DIAGNOSIS — Z01812 Encounter for preprocedural laboratory examination: Secondary | ICD-10-CM | POA: Diagnosis present

## 2019-11-24 DIAGNOSIS — N941 Unspecified dyspareunia: Secondary | ICD-10-CM

## 2019-11-24 DIAGNOSIS — R05 Cough: Secondary | ICD-10-CM

## 2019-11-24 HISTORY — DX: Unspecified dyspareunia: N94.10

## 2019-11-24 HISTORY — DX: Unspecified asthma, uncomplicated: J45.909

## 2019-11-24 HISTORY — DX: Disorder of teeth and supporting structures, unspecified: K08.9

## 2019-11-24 HISTORY — DX: Cardiac arrhythmia, unspecified: I49.9

## 2019-11-24 HISTORY — DX: Chronic cough: R05.3

## 2019-11-24 MED ORDER — OMEPRAZOLE 20 MG PO CPDR: 20.0000 mg | DELAYED_RELEASE_CAPSULE | Freq: Two times a day (BID) | ORAL | 2 refills | Status: DC

## 2019-11-24 MED ORDER — TRELEGY ELLIPTA 200-62.5-25 MCG/INH IN AEPB
1.0000 | INHALATION_SPRAY | Freq: Every day | RESPIRATORY_TRACT | 0 refills | Status: DC
Start: 2019-11-24 — End: 2019-11-24

## 2019-11-24 MED ORDER — TRELEGY ELLIPTA 200-62.5-25 MCG/INH IN AEPB: 1.0000 | INHALATION_SPRAY | Freq: Every day | RESPIRATORY_TRACT | 11 refills | Status: DC

## 2019-11-24 NOTE — Progress Notes (Signed)
Subjective:    Patient ID: [name], female    DOB: 01-29-1986, 34 y.o.   MRN: [id number]  HPI Patient is a 34 year old recent former smoker, with a prior history of asthma, who presents for evaluation of a cough that has been "constant" for a year.  She is kindly referred by Dr. Steele Sizer.  The patient notes that she has had a recurrent issue with this cough.  She responds to prednisone but a week after stopping prednisone her cough returns or worsens he notes that the cough causes her to have chest pain, tachycardia and a sore throat.  She also notes shortness of breath.  She states that she is unable to do normal daily activities.  She notes that prednisone, using her  inhaler and staying still helps her cough.  Aggravating factors are getting too hot, moving around a lot and occasionally odors.  She notes that she has significant gastroesophageal reflux symptoms.  She is on Pepcid but this is really not controlling the symptoms.  Sometimes regurgitates food.  She gets a globus sensation.  As noted she has a history of asthma as a child and also phonic bronchitis.  She does describe some orthopnea and some nocturnal awakenings requiring inhaler treatment.  She has not had any lower extremity edema or calf pain.  She does describe occasional rashes she is uncertain what triggers them.  She recently stopped smoking in March 2021 stating not by choice but because she felt that she could not inhale the cigarettes well.  She has been tested for tuberculosis previously being negative.   Review of Systems A 10 point review of systems was performed and it is as noted above otherwise negative.  Past Medical History:  Diagnosis Date  . Allergy   . Anxiety   . Asthma   . Bipolar 1 disorder ([place])   . Bipolar disorder ([place])   . Depression   . Ectopic pregnancy   . GERD (gastroesophageal reflux disease)   . History of concussion 2016  . Hyperlipidemia   . Migraine   . MRSA (methicillin  resistant Staphylococcus aureus)   . Pseudoseizures   . Scoliosis   . Scoliosis     Past Surgical History:  Procedure Laterality Date  . CARPAL TUNNEL RELEASE  2015   left  . HERNIA REPAIR  2010  . TUBAL LIGATION     Family History  Problem Relation Age of Onset  . Asthma Mother   . Heart disease Mother   . Diabetes Mother   . Hyperlipidemia Mother   . Hypertension Mother   . Mental illness Mother   . Migraines Mother   . Thyroid disease Mother   . Stroke Mother   . Alcohol abuse Mother   . Bipolar disorder Mother   . Anxiety disorder Mother   . COPD Mother   . Hypertension Sister   . Depression Sister   . Drug abuse Sister   . Cancer Maternal Grandmother   . Diabetes Maternal Grandmother   . Heart disease Maternal Grandmother   . Hyperlipidemia Maternal Grandmother   . Hypertension Maternal Grandmother   . Kidney disease Maternal Grandmother   . Heart disease Paternal Grandfather   . Alcohol abuse Paternal Grandfather   . Drug abuse Paternal Grandfather    Social History   Tobacco Use  . Smoking status: Former Smoker    Packs/day: 1.00    Years: 15.00    Pack years: 15.00    Types:  Cigarettes    Start date: 09/07/2019    Quit date: 08/2019    Years since quitting: 0.2  . Smokeless tobacco: Never Used  Substance Use Topics  . Alcohol use: No    Alcohol/week: 0.0 standard drinks   No military history, has resided in [place] all her life.  3 dogs and 2 cats in the home.  No exotic hobbies or pets.  Allergies  Allergen Reactions  . Robitussin (Alcohol Free) [Guaifenesin] Anaphylaxis  . Aspirin Other (See Comments)    Nose bleeds  . Tramadol     Due to Seizures per patient  . Chocolate Flavor Rash  . Codeine Rash  . Strawberry Extract Rash   Current Meds  Medication Sig  . albuterol (VENTOLIN HFA) 108 (90 Base) MCG/ACT inhaler Inhale 2 puffs into the lungs every 4 (four) hours as needed for wheezing or shortness of breath.  . benzonatate  (TESSALON) 200 MG capsule Take 1 capsule (200 mg total) by mouth 3 (three) times daily as needed for cough.  . divalproex (DEPAKOTE ER) 500 MG 24 hr tablet Take 1 tablet (500 mg total) by mouth at bedtime.  . famotidine (PEPCID) 20 MG tablet Take 1 tablet (20 mg total) by mouth 2 (two) times daily.  Marland Kitchen FLUoxetine (PROZAC) 20 MG capsule Take 1 capsule (20 mg total) by mouth daily. Take 1 tablet once daily (Patient taking differently: Take 20 mg by mouth daily. )  . fluticasone (FLONASE) 50 MCG/ACT nasal spray Place 1 spray into both nostrils 2 (two) times daily.  Marland Kitchen ibuprofen (ADVIL) 600 MG tablet Take 1 tablet (600 mg total) by mouth every 6 (six) hours as needed.  . Multiple Vitamins-Minerals (MULTIVITAMIN WITH MINERALS) tablet Take 1 tablet by mouth daily.  Marland Kitchen Spacer/Aero-Holding Chambers (AEROCHAMBER PLUS) inhaler Use as instructed  . traZODone (DESYREL) 100 MG tablet Take 1 tablet (100 mg total) by mouth at bedtime as needed for sleep.  . vitamin B-12 (CYANOCOBALAMIN) 1000 MCG tablet Take 1,000 mcg by mouth daily.  . [DISCONTINUED] fluticasone furoate-vilanterol (BREO ELLIPTA) 200-25 MCG/INH AEPB Inhale 1 puff into the lungs daily.   Immunization History  Administered Date(s) Administered  . Tdap 04/05/2015       Objective:   Physical Exam BP 110/80 (BP Location: Left Arm, Patient Position: Sitting, Cuff Size: Normal)   Pulse 89   Temp 97.7 F (36.5 C) (Temporal)   Ht 5\' 5"  (1.651 m)   Wt 166 lb 9.6 oz (75.6 kg)   LMP 11/09/2019   SpO2 97%   BMI 27.72 kg/m   GENERAL: Well-developed, well-nourished no acute respiratory distress, looks uncomfortable.  Fully ambulatory. HEAD: Normocephalic, atraumatic.  EYES: Pupils equal, round, reactive to light.  No scleral icterus.  MOUTH: Nose/mouth/throat not examined due to masking requirements for COVID 19. NECK: Supple. No thyromegaly. Trachea midline. No JVD.  No adenopathy. PULMONARY: Good air entry bilaterally, coarse breath sounds  bilaterally, rare end expiratory wheeze.  No other adventitious sounds. CARDIOVASCULAR: S1 and S2. Regular rate and rhythm.  No rubs, murmurs or gallops noted. GASTROINTESTINAL: Benign. MUSCULOSKELETAL: No joint deformity, no clubbing, no edema.  NEUROLOGIC: No focal deficits.  Awake and alert, fully oriented.  Ambulatory with no gait disturbance noted. SKIN: Intact,warm,dry.  Limited exam no rashes PSYCH: Mood and behavior appropriate  Most recent chest x-ray performed September 08, 2019 independently reviewed, no acute changes however patient does show significant hyperinflation.       Assessment & Plan:     ICD-10-CM  1. Cough  R05 Pulmonary Function Test [facility] Only   This may be aggravated by gastroesophageal reflux with LPR Early control asthma may be also an issue  2. Persistent asthma without complication, unspecified asthma severity  J45.909 Pulmonary Function Test [facility] Only    Allergen Panel (27) + IGE    CANCELED: Allergen Panel (27) + IGE   Will need PFTs Switched Breo to Trelegy Ellipta Allergy panel  3. Gastroesophageal reflux disease, unspecified whether esophagitis present  K21.9    Antireflux measures Continue Pepcid at bedtime Omeprazole 20 mg twice a day May need GI referral  4. Tobacco dependence in remission  F17.201    Commended on her abstinence from cigarettes   Meds ordered this encounter  Medications  . Fluticasone-Umeclidin-Vilant (TRELEGY ELLIPTA) 200-62.5-25 MCG/INH AEPB    Sig: Inhale 1 puff into the lungs daily.    Dispense:  28 each    Refill:  0    Order Specific Question:   Lot Number?    Answer:   [id number]    Order Specific Question:   Expiration Date?    Answer:   02/21/2021    Order Specific Question:   Manufacturer?    Answer:   Gl[employer]oSmithKline [[id number]]    Order Specific Question:   Quantity    Answer:   1  . omeprazole (PRILOSEC) 20 MG capsule    Sig: Take 1 capsule (20 mg total) by mouth 2 (two) times daily.    Dispense:  60 capsule     Refill:  2  . Fluticasone-Umeclidin-Vilant (TRELEGY ELLIPTA) 200-62.5-25 MCG/INH AEPB    Sig: Inhale 1 puff into the lungs daily.    Dispense:  28 each    Refill:  11    Discontinue Breo   Discussion:  The patient's cough is likely related to several issues 1 of these being gastroesophageal reflux with laryngopharyngeal reflux component which is poorly compensated.  She will be started on omeprazole as noted above.  She also has been advised to continue taking Pepcid at bedtime.  She also was advised on antireflux measures.  It also appears that her asthma is poorly controlled.  She recently was started on Breo which gave her a modicum of relief but has not completely resolved the issue.  We will switch Breo to Trelegy Ellipta 200/62.5/25, 1 inhalation daily.  Hopefully this will help her with her asthma issues as well as with relieving the cough reflex.  We will obtain pulmonary function testing as well as RAST panel to evaluate for potential allergies particularly since she has to felines in the home.  We will see her in follow-up in 4 to 6 weeks time she is to contact us prior to that time should any new difficulties arise.   Gailen Shelter, MD [facility]   *This note was dictated using voice recognition software/Dragon.  Despite best efforts to proofread, errors can occur which can change the meaning.  Any change was purely unintentional.

## 2019-11-24 NOTE — Patient Instructions (Signed)
We are going to get an allergy test and breathing tests.   We have switch your Breo Ellipta to Trelegy Ellipta 1 inhalation daily.  Make sure you rinse your mouth well after use.  You can use some baking soda in the water that you rinse with the really clear the residue from the inhaler.  You need control of your reflux at this can cause severe problems with cough.  We have placed you on omeprazole twice a day to help with this.  If this does not control it we may need to refer you to the gastroenterologist (stomach doctor).  You can still take Pepcid at bedtime.   We will see you in follow-up in 4 to 6 weeks time call sooner should any new difficulties arise.

## 2019-11-24 NOTE — Patient Instructions (Signed)
INSTRUCTIONS FOR SURGERY     Your surgery is scheduled for:   Friday, June 4TH     To find out your arrival time for the day of surgery,          please call [phone] between 1 pm and 3 pm on :  Thursday, June 3RD     When you arrive for surgery, report to the SECOND FLOOR OF THE MEDICAL MALL.       Do NOT stop on the first floor to register.    REMEMBER: Instructions that are not followed completely may result in serious medical risk,  up to and including death, or upon the discretion of your surgeon and anesthesiologist,            your surgery may need to be rescheduled.  __X__ 1. Do not eat food after midnight the night before your procedure.                    No gum, candy, lozenger, tic tacs, tums or hard candies.                  ABSOLUTELY NOTHING SOLID IN YOUR MOUTH AFTER MIDNIGHT                    You may drink unlimited clear liquids up to 2 hours before you are scheduled to arrive for surgery.                   Do not drink anything within those 2 hours unless you need to take medicine, then take the                   smallest amount you need.  Clear liquids include:  water, apple juice without pulp,                   any flavor Gatorade, Black coffee, black tea.  Sugar may be added but no dairy/ honey /lemon.                        Broth and jello is not considered a clear liquid.  __x__  2. On the morning of surgery, please brush your teeth with toothpaste and water. You may rinse with                  mouthwash if you wish but DO NOT SWALLOW TOOTHPASTE OR MOUTHWASH  __X___3. NO alcohol for 24 hours before or after surgery.  __x___ 4.  Do NOT smoke or use e-cigarettes for 24 HOURS PRIOR TO SURGERY.                      DO NOT Use any chewable tobacco products for at least 6 hours prior to surgery.  __x___ 5. If you start any new medication after this appointment and prior to surgery, please  Bring it with you on the day of surgery.  ___x__ 6. Notify your doctor if there is any change in your medical condition, such as fever,  nfection, vomitting, diarrhea or any open sores.  __x___ 7.  USE the CHG SOAP as instructed, the night before surgery and the day of surgery.                   Once you have washed with this soap, do NOT use any of the following: Powders, perfumes                    or lotions. Please do not wear make up, hairpins, clips or nail polish. You may wear deodorant.                   Men may shave their face and neck.  Women need to shave 48 hours prior to surgery.                   DO NOT wear ANY jewelry on the day of surgery. If there are rings that are too tight to                    remove easily, please address this prior to the surgery day. Piercings need to be removed.                                                                     NO METAL ON YOUR BODY.                    Do NOT bring any valuables.  If you came to Pre-Admit testing then you will not need license,                     insurance card or credit card.  If you will be staying overnight, please either leave your things in                     the car or have your family be responsible for these items.                     [facility] IS NOT RESPONSIBLE FOR BELONGINGS OR VALUABLES.  ___X__ 8. DO NOT wear contact lenses on surgery day.  You may not have dentures,                     Hearing aides, contacts or glasses in the operating room. These items can be                    Placed in the Recovery Room to receive immediately after surgery.  __x___ 9. IF YOU ARE SCHEDULED TO GO HOME ON THE SAME DAY, YOU MUST                   Have someone to drive you home and to stay with you  for the first 24 hours.                    Have an arrangement prior to arriving on surgery day.  ___x__ 10. Take the following medications on the morning of surgery with a sip of water:  1.PRILOSEC                     2.PROZAC                     3.TRELEGY INHALER                     4.FLONASE    _____ 11.  Follow any instructions provided to you by your surgeon.                        Such as enema, clear liquid bowel prep  __X__  12. STOP ALL ASPIRIN PRODUCTS AS OF:NOW   (11/24/19)                       THIS INCLUDES BC POWDERS / GOODIES POWDER  __x___ 13. STOP Anti-inflammatories as of: NOW    (11/24/19)                      This includes IBUPROFEN / MOTRIN / ADVIL / High [place].  ___X__ 14.  Stop supplements until after surgery.                     This includes:MULTIVITAMINS                 You may continue taking Vitamin B12 but do not take on the morning of surgery.  ___X___18. If staying overnight, please have appropriate shoes to wear to be able to walk around the unit.                   Wear clean and comfortable clothing to the hospital.  BRING PHONE NUMBERS FOR YOUR CONTACT PEOPLE BRING A CELL PHONE CHARGER IN CASE YOU STAY OVERNIGHT.

## 2019-11-26 ENCOUNTER — Other Ambulatory Visit
Admission: RE | Admit: 2019-11-26 | Discharge: 2019-11-26 | Disposition: A | Discharge status: Home / Self Care | Payer: Commercial Managed Care - PPO | Source: Ambulatory Visit | Attending: Obstetrics & Gynecology | Admitting: Obstetrics & Gynecology

## 2019-11-26 ENCOUNTER — Other Ambulatory Visit: Payer: Self-pay

## 2019-11-26 ENCOUNTER — Telehealth: Payer: Self-pay | Admitting: Obstetrics & Gynecology

## 2019-11-26 DIAGNOSIS — Z01812 Encounter for preprocedural laboratory examination: Secondary | ICD-10-CM | POA: Diagnosis present

## 2019-11-26 DIAGNOSIS — Z20822 Contact with and (suspected) exposure to covid-19: Secondary | ICD-10-CM | POA: Diagnosis not present

## 2019-11-26 LAB — TYPE AND SCREEN
ABO/RH(D): O NEG
Antibody Screen: NEGATIVE

## 2019-11-26 LAB — CBC
HCT: 38.9 % (ref 36.0–46.0)
Hemoglobin: 13.2 g/dL (ref 12.0–15.0)
MCH: 30.6 pg (ref 26.0–34.0)
MCHC: 33.9 g/dL (ref 30.0–36.0)
MCV: 90 fL (ref 80.0–100.0)
Platelets: 290 10*3/uL (ref 150–400)
RBC: 4.32 MIL/uL (ref 3.87–5.11)
RDW: 12.4 % (ref 11.5–15.5)
WBC: 9 10*3/uL (ref 4.0–10.5)
nRBC: 0 % (ref 0.0–0.2)

## 2019-11-26 LAB — SARS CORONAVIRUS 2 (TAT 6-24 HRS): SARS Coronavirus 2: NEGATIVE

## 2019-11-26 NOTE — Telephone Encounter (Signed)
Spoke to pt and made her aware of the fact that her insurance has her case pending review with the the [employer] geologist. I adv that I will check the ststus first thing tomorrow morning and call her with what I am able to find out.  I adv that we may have to postpone the procedure until we receive authorization. She was not happy but understood.  I have also kept Dr Tiburcio Pea updated on the situation as I have been working on this case throughout the day.

## 2019-11-27 ENCOUNTER — Ambulatory Visit: Payer: Commercial Managed Care - PPO | Admitting: Anesthesiology

## 2019-11-27 ENCOUNTER — Encounter
Admission: RE | Disposition: A | Discharge status: Home / Self Care | Payer: Self-pay | Source: Home / Self Care | Attending: Obstetrics & Gynecology

## 2019-11-27 ENCOUNTER — Ambulatory Visit
Admission: RE | Admit: 2019-11-27 | Discharge: 2019-11-27 | Disposition: A | Discharge status: Home / Self Care | Payer: Commercial Managed Care - PPO | Attending: Obstetrics & Gynecology | Admitting: Obstetrics & Gynecology

## 2019-11-27 ENCOUNTER — Encounter: Payer: Self-pay | Admitting: Obstetrics & Gynecology

## 2019-11-27 ENCOUNTER — Other Ambulatory Visit: Payer: Self-pay

## 2019-11-27 DIAGNOSIS — N946 Dysmenorrhea, unspecified: Secondary | ICD-10-CM | POA: Diagnosis not present

## 2019-11-27 DIAGNOSIS — Z885 Allergy status to narcotic agent status: Secondary | ICD-10-CM | POA: Diagnosis not present

## 2019-11-27 DIAGNOSIS — F419 Anxiety disorder, unspecified: Secondary | ICD-10-CM | POA: Insufficient documentation

## 2019-11-27 DIAGNOSIS — N9419 Other specified dyspareunia: Secondary | ICD-10-CM | POA: Diagnosis present

## 2019-11-27 DIAGNOSIS — R102 Pelvic and perineal pain: Secondary | ICD-10-CM | POA: Diagnosis not present

## 2019-11-27 DIAGNOSIS — J45909 Unspecified asthma, uncomplicated: Secondary | ICD-10-CM | POA: Insufficient documentation

## 2019-11-27 DIAGNOSIS — F319 Bipolar disorder, unspecified: Secondary | ICD-10-CM | POA: Diagnosis not present

## 2019-11-27 DIAGNOSIS — Z7951 Long term (current) use of inhaled steroids: Secondary | ICD-10-CM | POA: Insufficient documentation

## 2019-11-27 DIAGNOSIS — N736 Female pelvic peritoneal adhesions (postinfective): Secondary | ICD-10-CM | POA: Diagnosis not present

## 2019-11-27 DIAGNOSIS — N941 Unspecified dyspareunia: Secondary | ICD-10-CM | POA: Diagnosis not present

## 2019-11-27 DIAGNOSIS — Z886 Allergy status to analgesic agent status: Secondary | ICD-10-CM | POA: Insufficient documentation

## 2019-11-27 DIAGNOSIS — Z79899 Other long term (current) drug therapy: Secondary | ICD-10-CM | POA: Diagnosis not present

## 2019-11-27 DIAGNOSIS — Z87891 Personal history of nicotine dependence: Secondary | ICD-10-CM | POA: Insufficient documentation

## 2019-11-27 DIAGNOSIS — Z888 Allergy status to other drugs, medicaments and biological substances status: Secondary | ICD-10-CM | POA: Insufficient documentation

## 2019-11-27 HISTORY — PX: TOTAL LAPAROSCOPIC HYSTERECTOMY WITH SALPINGECTOMY: SHX6742

## 2019-11-27 HISTORY — PX: CYSTOSCOPY: SHX5120

## 2019-11-27 LAB — ALLERGEN PANEL (27) + IGE
Alternaria Alternata IgE: <0.1 kU/L
Aspergillus Fumigatus IgE: <0.1 kU/L
Bahia Grass IgE: <0.1 kU/L
Bermuda Grass IgE: <0.1 kU/L
Cat Dander IgE: <0.1 kU/L
Cedar, Mountain IgE: <0.1 kU/L
Cladosporium Herbarum IgE: <0.1 kU/L
Cocklebur IgE: <0.1 kU/L
Cockroach, American IgE: <0.1 kU/L
Common Silver Birch IgE: <0.1 kU/L
D Farinae IgE: <0.1 kU/L
D Pteronyssinus IgE: <0.1 kU/L
Dog Dander IgE: <0.1 kU/L
Elm, American IgE: <0.1 kU/L
Hickory, White IgE: <0.1 kU/L
IgE (Immunoglobulin E), Serum: 77 IU/mL (ref 6–495)
Johnson Grass IgE: <0.1 kU/L
Kentucky Bluegrass IgE: <0.1 kU/L
Maple/Box Elder IgE: <0.1 kU/L
Mucor Racemosus IgE: <0.1 kU/L
Oak, White IgE: <0.1 kU/L
Penicillium Chrysogen IgE: <0.1 kU/L
Pigweed, Rough IgE: <0.1 kU/L
Plantain, English IgE: <0.1 kU/L
Ragweed, Short IgE: <0.1 kU/L
Setomelanomma Rostrat: <0.1 kU/L
Timothy Grass IgE: <0.1 kU/L
White Mulberry IgE: <0.1 kU/L

## 2019-11-27 LAB — POCT PREGNANCY, URINE: Preg Test, Ur: NEGATIVE

## 2019-11-27 LAB — ABO/RH: ABO/RH(D): O NEG

## 2019-11-27 SURGERY — HYSTERECTOMY, TOTAL, LAPAROSCOPIC, WITH SALPINGECTOMY
Anesthesia: General | Laterality: Bilateral

## 2019-11-27 MED ORDER — MEPERIDINE HCL 50 MG/ML IJ SOLN: 6.2500 mg | INTRAMUSCULAR | Status: DC | PRN

## 2019-11-27 MED ORDER — FENTANYL CITRATE (PF) 100 MCG/2ML IJ SOLN
25.0000 ug | INTRAMUSCULAR | Status: AC | PRN
  Administered 2019-11-27 (×4): 25 ug via INTRAVENOUS

## 2019-11-27 MED ORDER — FENTANYL CITRATE (PF) 100 MCG/2ML IJ SOLN
INTRAMUSCULAR | Status: DC | PRN
  Administered 2019-11-27 (×5): 50 ug via INTRAVENOUS

## 2019-11-27 MED ORDER — PROMETHAZINE HCL 25 MG/ML IJ SOLN
INTRAMUSCULAR | Status: AC
  Administered 2019-11-27: 6.25 mg via INTRAVENOUS
  Filled 2019-11-27: qty 1

## 2019-11-27 MED ORDER — LIDOCAINE HCL (CARDIAC) PF 100 MG/5ML IV SOSY
PREFILLED_SYRINGE | INTRAVENOUS | Status: DC | PRN
  Administered 2019-11-27: 80 mg via INTRAVENOUS

## 2019-11-27 MED ORDER — LACTATED RINGERS IV SOLN
INTRAVENOUS | Status: DC
  Administered 2019-11-27: 100 mL/h via INTRAVENOUS

## 2019-11-27 MED ORDER — FENTANYL CITRATE (PF) 100 MCG/2ML IJ SOLN
INTRAMUSCULAR | Status: AC
  Filled 2019-11-27: qty 2

## 2019-11-27 MED ORDER — OXYCODONE-ACETAMINOPHEN 5-325 MG PO TABS: 1.0000 | ORAL_TABLET | ORAL | Status: DC | PRN

## 2019-11-27 MED ORDER — CHLORHEXIDINE GLUCONATE 0.12 % MT SOLN: 15.0000 mL | Freq: Once | OROMUCOSAL | Status: AC

## 2019-11-27 MED ORDER — FENTANYL CITRATE (PF) 100 MCG/2ML IJ SOLN
INTRAMUSCULAR | Status: AC
  Administered 2019-11-27: 25 ug via INTRAVENOUS
  Filled 2019-11-27: qty 2

## 2019-11-27 MED ORDER — ACETAMINOPHEN 325 MG PO TABS: 650.0000 mg | ORAL_TABLET | ORAL | Status: DC | PRN

## 2019-11-27 MED ORDER — ONDANSETRON HCL 4 MG/2ML IJ SOLN
INTRAMUSCULAR | Status: DC | PRN
  Administered 2019-11-27: 4 mg via INTRAVENOUS

## 2019-11-27 MED ORDER — OXYCODONE HCL 5 MG PO TABS
5.0000 mg | ORAL_TABLET | Freq: Once | ORAL | Status: AC | PRN
  Administered 2019-11-27: 5 mg via ORAL

## 2019-11-27 MED ORDER — FENTANYL CITRATE (PF) 250 MCG/5ML IJ SOLN
INTRAMUSCULAR | Status: AC
  Filled 2019-11-27: qty 5

## 2019-11-27 MED ORDER — DEXAMETHASONE SODIUM PHOSPHATE 10 MG/ML IJ SOLN
INTRAMUSCULAR | Status: DC | PRN
  Administered 2019-11-27: 5 mg via INTRAVENOUS

## 2019-11-27 MED ORDER — LACTATED RINGERS IV SOLN: INTRAVENOUS | Status: DC

## 2019-11-27 MED ORDER — FENTANYL CITRATE (PF) 100 MCG/2ML IJ SOLN
50.0000 ug | Freq: Once | INTRAMUSCULAR | Status: AC
  Administered 2019-11-27: 50 ug via INTRAVENOUS

## 2019-11-27 MED ORDER — ROCURONIUM BROMIDE 100 MG/10ML IV SOLN
INTRAVENOUS | Status: DC | PRN
  Administered 2019-11-27: 50 mg via INTRAVENOUS

## 2019-11-27 MED ORDER — ACETAMINOPHEN 650 MG RE SUPP
650.0000 mg | RECTAL | Status: DC | PRN
  Filled 2019-11-27: qty 1

## 2019-11-27 MED ORDER — ACETAMINOPHEN 10 MG/ML IV SOLN
INTRAVENOUS | Status: AC
  Filled 2019-11-27: qty 100

## 2019-11-27 MED ORDER — SODIUM CHLORIDE 0.9 % IV SOLN
INTRAVENOUS | Status: AC
  Filled 2019-11-27: qty 2

## 2019-11-27 MED ORDER — PROPOFOL 10 MG/ML IV BOLUS
INTRAVENOUS | Status: AC
  Filled 2019-11-27: qty 40

## 2019-11-27 MED ORDER — SODIUM CHLORIDE 0.9 % IV SOLN
2.0000 g | INTRAVENOUS | Status: AC
  Administered 2019-11-27: 2 g via INTRAVENOUS

## 2019-11-27 MED ORDER — OXYCODONE-ACETAMINOPHEN 5-325 MG PO TABS: 1.0000 | ORAL_TABLET | ORAL | 0 refills | Status: DC | PRN

## 2019-11-27 MED ORDER — SODIUM CHLORIDE FLUSH 0.9 % IV SOLN
INTRAVENOUS | Status: AC
  Filled 2019-11-27: qty 10

## 2019-11-27 MED ORDER — MIDAZOLAM HCL 2 MG/2ML IJ SOLN
INTRAMUSCULAR | Status: DC | PRN
  Administered 2019-11-27: 2 mg via INTRAVENOUS

## 2019-11-27 MED ORDER — BUPIVACAINE HCL (PF) 0.5 % IJ SOLN
INTRAMUSCULAR | Status: DC | PRN
  Administered 2019-11-27: 14 mL

## 2019-11-27 MED ORDER — PROMETHAZINE HCL 25 MG/ML IJ SOLN: 6.2500 mg | INTRAMUSCULAR | Status: DC | PRN

## 2019-11-27 MED ORDER — MORPHINE SULFATE (PF) 2 MG/ML IV SOLN: 1.0000 mg | INTRAVENOUS | Status: DC | PRN

## 2019-11-27 MED ORDER — OXYCODONE HCL 5 MG PO TABS
ORAL_TABLET | ORAL | Status: AC
  Filled 2019-11-27: qty 1

## 2019-11-27 MED ORDER — SUGAMMADEX SODIUM 200 MG/2ML IV SOLN
INTRAVENOUS | Status: DC | PRN
  Administered 2019-11-27: 175 mg via INTRAVENOUS

## 2019-11-27 MED ORDER — KETOROLAC TROMETHAMINE 30 MG/ML IJ SOLN: 30.0000 mg | Freq: Once | INTRAMUSCULAR | Status: AC

## 2019-11-27 MED ORDER — PROPOFOL 10 MG/ML IV BOLUS
INTRAVENOUS | Status: DC | PRN
  Administered 2019-11-27: 150 mg via INTRAVENOUS

## 2019-11-27 MED ORDER — ACETAMINOPHEN 10 MG/ML IV SOLN
INTRAVENOUS | Status: DC | PRN
  Administered 2019-11-27: 1000 mg via INTRAVENOUS

## 2019-11-27 MED ORDER — BUPIVACAINE HCL (PF) 0.5 % IJ SOLN
INTRAMUSCULAR | Status: AC
  Filled 2019-11-27: qty 30

## 2019-11-27 MED ORDER — CHLORHEXIDINE GLUCONATE 0.12 % MT SOLN
OROMUCOSAL | Status: AC
  Administered 2019-11-27: 15 mL via OROMUCOSAL
  Filled 2019-11-27: qty 15

## 2019-11-27 MED ORDER — MIDAZOLAM HCL 2 MG/2ML IJ SOLN
INTRAMUSCULAR | Status: AC
  Filled 2019-11-27: qty 2

## 2019-11-27 MED ORDER — KETOROLAC TROMETHAMINE 30 MG/ML IJ SOLN
INTRAMUSCULAR | Status: AC
  Administered 2019-11-27: 30 mg via INTRAVENOUS
  Filled 2019-11-27: qty 1

## 2019-11-27 MED ORDER — OXYCODONE HCL 5 MG/5ML PO SOLN: 5.0000 mg | Freq: Once | ORAL | Status: AC | PRN

## 2019-11-27 MED ORDER — ORAL CARE MOUTH RINSE: 15.0000 mL | Freq: Once | OROMUCOSAL | Status: AC

## 2019-11-27 SURGICAL SUPPLY — 52 items
BAG URINE DRAIN 2000ML AR STRL (UROLOGICAL SUPPLIES) ×4 IMPLANT
BLADE SURG SZ11 CARB STEEL (BLADE) ×4 IMPLANT
CANISTER SUCT 1200ML W/VALVE (MISCELLANEOUS) ×4 IMPLANT
CATH FOLEY 2WAY  5CC 16FR (CATHETERS) ×3
CATH URTH 16FR FL 2W BLN LF (CATHETERS) ×2 IMPLANT
CHLORAPREP W/TINT 26 (MISCELLANEOUS) ×4 IMPLANT
COVER WAND RF STERILE (DRAPES) ×4 IMPLANT
DEFOGGER SCOPE WARMER CLEARIFY (MISCELLANEOUS) ×4 IMPLANT
DERMABOND ADVANCED (GAUZE/BANDAGES/DRESSINGS) ×2
DERMABOND ADVANCED .7 DNX12 (GAUZE/BANDAGES/DRESSINGS) ×2 IMPLANT
DEVICE SUTURE ENDOST 10MM (ENDOMECHANICALS) ×2 IMPLANT
DRAPE CAMERA CLOSED 9X96 (DRAPES) ×4 IMPLANT
DRSG TEGADERM 2-3/8X2-3/4 SM (GAUZE/BANDAGES/DRESSINGS) IMPLANT
GAUZE 4X4 16PLY RFD (DISPOSABLE) ×4 IMPLANT
GLOVE BIO SURGEON STRL SZ8 (GLOVE) ×24 IMPLANT
GLOVE INDICATOR 8.0 STRL GRN (GLOVE) ×8 IMPLANT
GOWN STRL REUS W/ TWL LRG LVL3 (GOWN DISPOSABLE) ×8 IMPLANT
GOWN STRL REUS W/ TWL XL LVL3 (GOWN DISPOSABLE) ×4 IMPLANT
GOWN STRL REUS W/TWL LRG LVL3 (GOWN DISPOSABLE) ×16
GOWN STRL REUS W/TWL XL LVL3 (GOWN DISPOSABLE) ×8
GRASPER SUT TROCAR 14GX15 (MISCELLANEOUS) ×4 IMPLANT
IRRIGATION STRYKERFLOW (MISCELLANEOUS) ×2 IMPLANT
IRRIGATOR STRYKERFLOW (MISCELLANEOUS) ×4
IV LACTATED RINGERS 1000ML (IV SOLUTION) ×8 IMPLANT
KIT PINK PAD W/HEAD ARE REST (MISCELLANEOUS) ×4
KIT PINK PAD W/HEAD ARM REST (MISCELLANEOUS) ×2 IMPLANT
KIT TURNOVER CYSTO (KITS) ×4 IMPLANT
LABEL OR SOLS (LABEL) ×4 IMPLANT
MANIPULATOR VCARE LG CRV RETR (MISCELLANEOUS) IMPLANT
MANIPULATOR VCARE SML CRV RETR (MISCELLANEOUS) IMPLANT
MANIPULATOR VCARE STD CRV RETR (MISCELLANEOUS) ×2 IMPLANT
NEEDLE VERESS 14GA 120MM (NEEDLE) ×4 IMPLANT
NS IRRIG 500ML POUR BTL (IV SOLUTION) ×4 IMPLANT
OCCLUDER COLPOPNEUMO (BALLOONS) ×4 IMPLANT
PACK GYN LAPAROSCOPIC (MISCELLANEOUS) ×4 IMPLANT
PAD OB MATERNITY 4.3X12.25 (PERSONAL CARE ITEMS) ×4 IMPLANT
PAD PREP 24X41 OB/GYN DISP (PERSONAL CARE ITEMS) ×4 IMPLANT
PORT ACCESS TROCAR AIRSEAL 12 (TROCAR) ×2 IMPLANT
PORT ACCESS TROCAR AIRSEAL 5M (TROCAR) ×2
SCISSORS METZENBAUM CVD 33 (INSTRUMENTS) ×2 IMPLANT
SET CYSTO W/LG BORE CLAMP LF (SET/KITS/TRAYS/PACK) ×4 IMPLANT
SET TRI-LUMEN FLTR TB AIRSEAL (TUBING) ×4 IMPLANT
SHEARS HARMONIC ACE PLUS 36CM (ENDOMECHANICALS) ×4 IMPLANT
SLEEVE ENDOPATH XCEL 5M (ENDOMECHANICALS) ×4 IMPLANT
SPONGE GAUZE 2X2 8PLY STER LF (GAUZE/BANDAGES/DRESSINGS)
SPONGE GAUZE 2X2 8PLY STRL LF (GAUZE/BANDAGES/DRESSINGS) IMPLANT
SUT ENDO VLOC 180-0-8IN (SUTURE) ×2 IMPLANT
SUT VIC AB 0 CT1 36 (SUTURE) ×4 IMPLANT
SUT VIC AB 4-0 FS2 27 (SUTURE) ×4 IMPLANT
SYR 10ML LL (SYRINGE) ×4 IMPLANT
SYR 50ML LL SCALE MARK (SYRINGE) ×4 IMPLANT
TROCAR XCEL NON-BLD 5MMX100MML (ENDOMECHANICALS) ×4 IMPLANT

## 2019-11-27 NOTE — Discharge Instructions (Signed)
Total Laparoscopic Hysterectomy, Care After This sheet gives you information about how to care for yourself after your procedure. Your health care provider may also give you more specific instructions. If you have problems or questions, contact your health care provider. What can I expect after the procedure? After the procedure, it is common to have:  Pain and bruising around your incisions.  A sore throat, if a breathing tube was used during surgery.  Fatigue.  Poor appetite.  Less interest in sex. If your ovaries were also removed, it is also common to have symptoms of menopause such as hot flashes, night sweats, and lack of sleep (insomnia). Follow these instructions at home: Bathing  Do not take baths, swim, or use a hot tub until your health care provider approves. You may need to only take showers for 2-3 weeks.  Keep your bandage (dressing) dry until your health care provider says it can be removed. Incision care   Follow instructions from your health care provider about how to take care of your incisions. Make sure you: ? Wash your hands with soap and water before you change your dressing. If soap and water are not available, use hand sanitizer. ? Change your dressing as told by your health care provider. ? Leave stitches (sutures), skin glue, or adhesive strips in place. These skin closures may need to stay in place for 2 weeks or longer. If adhesive strip edges start to loosen and curl up, you may trim the loose edges. Do not remove adhesive strips completely unless your health care provider tells you to do that.  Check your incision area every day for signs of infection. Check for: ? Redness, swelling, or pain. ? Fluid or blood. ? Warmth. ? Pus or a bad smell. Activity  Get plenty of rest and sleep.  Do not lift anything that is heavier than 10 lbs (4.5 kg) for one month after surgery, or as long as told by your health care provider.  Do not drive or use heavy  machinery while taking prescription pain medicine.  Do not drive for 24 hours if you were given a medicine to help you relax (sedative).  Return to your normal activities as told by your health care provider. Ask your health care provider what activities are safe for you. Lifestyle   Do not use any products that contain nicotine or tobacco, such as cigarettes and e-cigarettes. These can delay healing. If you need help quitting, ask your health care provider.  Do not drink alcohol until your health care provider approves. General instructions  Do not douche, use tampons, or have sex for at least 6 weeks, or as told by your health care provider.  Take over-the-counter and prescription medicines only as told by your health care provider.  To monitor yourself for a fever, take your temperature at least once a day during recovery.  If you struggle with physical or emotional changes after your procedure, speak with your health care provider or a therapist.  To prevent or treat constipation while you are taking prescription pain medicine, your health care provider may recommend that you: ? Drink enough fluid to keep your urine clear or pale yellow. ? Take over-the-counter or prescription medicines. ? Eat foods that are high in fiber, such as fresh fruits and vegetables, whole grains, and beans. ? Limit foods that are high in fat and processed sugars, such as fried and sweet foods.  Keep all follow-up visits as told by your health care provider.   This is important. Contact a health care provider if:  You have chills or a fever.  You have redness, swelling, or pain around an incision.  You have fluid or blood coming from an incision.  Your incision feels warm to the touch.  You have pus or a bad smell coming from an incision.  An incision breaks open.  You feel dizzy or light-headed.  You have pain or bleeding when you urinate.  You have diarrhea, nausea, or vomiting that does not  go away.  You have abnormal vaginal discharge.  You have a rash.  You have pain that does not get better with medicine. Get help right away if:  You have a fever and your symptoms suddenly get worse.  You have severe abdominal pain.  You have chest pain.  You have shortness of breath.  You faint.  You have pain, swelling, or redness on your leg.  You have heavy vaginal bleeding with blood clots. Summary  After the procedure it is common to have abdominal pain. Your provider will give you medication for this.  Do not take baths, swim, or use a hot tub until your health care provider approves.  Do not lift anything that is heavier than 10 lbs (4.5 kg) for one month after surgery, or as long as told by your health care provider.  Notify your provider if you have any signs or symptoms of infection after the procedure. This information is not intended to replace advice given to you by your health care provider. Make sure you discuss any questions you have with your health care provider. Document Revised: 05/24/2017 Document Reviewed: 08/22/2016 Elsevier Patient Education  2020 Elsevier Inc.    AMBULATORY SURGERY  DISCHARGE INSTRUCTIONS   1) The drugs that you were given will stay in your system until tomorrow so for the next 24 hours you should not:  A) Drive an automobile B) Make any legal decisions C) Drink any alcoholic beverage   2) You may resume regular meals tomorrow.  Today it is better to start with liquids and gradually work up to solid foods.  You may eat anything you prefer, but it is better to start with liquids, then soup and crackers, and gradually work up to solid foods.   3) Please notify your doctor immediately if you have any unusual bleeding, trouble breathing, redness and pain at the surgery site, drainage, fever, or pain not relieved by medication.    4) Additional Instructions:        Please contact your physician with any problems  or Same Day Surgery at [phone], Monday through Friday 6 am to 4 pm, or [facility] [facility] number at [phone]. 

## 2019-11-27 NOTE — Anesthesia Procedure Notes (Signed)
Procedure Name: Intubation Date/Time: 11/27/2019 12:35 PM Performed by: Clyde Lundborg, CRNA Pre-anesthesia Checklist: Patient identified, Emergency Drugs available, Suction available and Patient being monitored Patient Re-evaluated:Patient Re-evaluated prior to induction Oxygen Delivery Method: Circle system utilized Preoxygenation: Pre-oxygenation with 100% oxygen Induction Type: IV induction Ventilation: Mask ventilation without difficulty Laryngoscope Size: McGraph and 3 Grade View: Grade I Tube type: Oral Number of attempts: 1 Airway Equipment and Method: Video-laryngoscopy Placement Confirmation: ETT inserted through vocal cords under direct vision,  positive ETCO2,  breath sounds checked- equal and bilateral and CO2 detector Secured at: 21 cm Tube secured with: Tape Dental Injury: Teeth and Oropharynx as per pre-operative assessment

## 2019-11-27 NOTE — Anesthesia Postprocedure Evaluation (Signed)
Anesthesia Post Note  Patient: [name]  Procedure(s) Performed: TOTAL LAPAROSCOPIC HYSTERECTOMY WITH SALPINGECTOMY (Bilateral ) CYSTOSCOPY  Patient location during evaluation: PACU Anesthesia Type: General Level of consciousness: awake and alert and oriented Pain management: pain level controlled Vital Signs Assessment: post-procedure vital signs reviewed and stable Respiratory status: spontaneous breathing, nonlabored ventilation and respiratory function stable Cardiovascular status: blood pressure returned to baseline and stable Postop Assessment: no signs of nausea or vomiting Anesthetic complications: no     Last Vitals:  Vitals:   11/27/19 1437 11/27/19 1452  BP: (!) 115/53 (!) 120/55  Pulse: (!) 105 (!) 103  Resp: 17 14  Temp:    SpO2: 97% 97%    Last Pain:  Vitals:   11/27/19 1452  TempSrc:   PainSc: Asleep                 Daissy Yerian

## 2019-11-27 NOTE — Telephone Encounter (Signed)
Called pt first thing this morning to inform that her insurance case is still pending review and I plan to call when they open at 8:30am to try to push for a decision.  Pt said that she is willing to accept full responsibility for the cost of the surgery and will make payments as long as need be. She will give [place] her card and allow for pymt to be drafted bi- weekly.  Kimmie Q in pre-service, adv pt that a large amount will be due up-front to proceed. Pt adv that she only has $100 on the card. Kimmie didn't think that would be enough but was going to check. She has since contacted the pt and me saying that the $100 would be accepted.  I currently am on the phone with pt insurance, [employer], trying my best to find out if it will be approved or denied.

## 2019-11-27 NOTE — Telephone Encounter (Signed)
Rec'd call from insurance that the case scheduled for today w Dr Tiburcio Pea for CPT [id number] has been approved.  Auth # [id number]

## 2019-11-27 NOTE — Transfer of Care (Signed)
Immediate Anesthesia Transfer of Care Note  Patient: [name]  Procedure(s) Performed: TOTAL LAPAROSCOPIC HYSTERECTOMY WITH SALPINGECTOMY (Bilateral ) CYSTOSCOPY  Patient Location: PACU  Anesthesia Type:General  Level of Consciousness: awake, alert  and oriented  Airway & Oxygen Therapy: Patient Spontanous Breathing  Post-op Assessment: Report given to RN and Post -op Vital signs reviewed and stable  Post vital signs: Reviewed and stable  Last Vitals:  Vitals Value Taken Time  BP 106/85 11/27/19 1407  Temp 36.2 C 11/27/19 1407  Pulse 120 11/27/19 1410  Resp 14 11/27/19 1410  SpO2 97 % 11/27/19 1410  Vitals shown include unvalidated device data.  Last Pain:  Vitals:   11/27/19 1043  TempSrc: Tympanic  PainSc: 7          Complications: No apparent anesthesia complications

## 2019-11-27 NOTE — Op Note (Signed)
Operative Report:  PRE-OP DIAGNOSIS: Dysmenorrhea  Pelvic Pain Dyspareunia due to medical conditionin female  POST-OP DIAGNOSIS: Dysmenorrhea  Pelvic Pain Dyspareunia due to medical conditionin female   PROCEDURE: Procedure(s): TOTAL LAPAROSCOPIC HYSTERECTOMY WITH SALPINGECTOMY CYSTOSCOPY  SURGEON: Barnett Applebaum, MD, FACOG  ANESTHESIA: General endotracheal anesthesia  ESTIMATED BLOOD LOSS: 20 mL  SPECIMENS: Uterus, Tubes.  COMPLICATIONS: None  DISPOSITION: stable to PACU  FINDINGS: Intraabdominal adhesions were not noted. Normal ovaries.  PROCEDURE:  The patient was taken to the OR where anesthesia was administed. She was prepped and draped in the normal sterile fashion in the dorsal lithotomy position in the [place] stirrups. A time out was performed. A Graves speculum was inserted, the cervix was grasped with a single tooth tenaculum and the endometrial cavity was sounded. The cervix was progressively dilated to a size 18 [place] with [employer] dilators. A V-Care uterine manipulator was inserted in the usual fashion without incident. Gloves were changed and attention was turned to the abdomen.   An infraumbilical transverse 1mm skin incision was made with the scalpel after local anesthesia applied to the skin. A Veress-step needle was inserted in the usual fashion and confirmed using the hanging drop technique. A pneumoperitoneum was obtained by insufflation of CO2 (opening pressure of 35mmHg) to 4mmHg. A diagnostic laparoscopy was performed yielding the previously described findings. Attention was turned to the left lower quadrant where after visualization of the inferior epigastric vessels a 40mm skin incision was made with the scalpel. A 5 mm laparoscopic port was inserted. The same procedure was repeated in the right lower quadrant with a 42mm trocar. Attention was turned to the left aspect of the uterus, where after visualization of the ureter, the round ligament was coagulated and  transected using the 58mm Harmonic Scapel. The anterior and posterior leafs of the broad ligament were dissected off as the anterior one was coagulated and transected in a caudal direction towards the cuff of the uterine manipulator.  Attention was then turned to the left fallopian tube which was recognized by visualization of the fimbria. The tube is excised to its attachment to the uterus. The uterine-ovarian ligament and its blood vessels were carefully coagulated and transected using the Harmonic scapel.  Attention was turned to the right aspect of the uterus where the same procedure was performed.  The vesicouterine reflection of the peritoneum was dissected with the harmonic scapel and the bladder flap was created bluntly.  The uterine vessels were coagulated and transected bilaterally using first bipolar cautery and then the harmonic scapel. A 360 degree, circumferential colpotomy was done to completely amputate the uterus with cervix and tubes. Once the specimen was amputated it was delivered through the vagina.   The colpotomy was repaired in a simple running fashion using a delayed absorbable suture with an endo-stitch device.  Vaginal exam confirms complete closure.  The cavity was copiously irrigated. A survey of the pelvic cavity revealed adequate hemostasis and no injury to bowel, bladder, or ureter.   A diagnostic cystoscopy was performed using saline distension of bladder with no lesions or injuries noted.  Bilateral urine flow from each ureteral orifice is visualized.  At this point the procedure was finalized. Right lower quadrant fascia incision is closed with a vicryl suture using the fascia closure device. All the instruments were removed from the patient's body. Gas was expelled and patient is leveled.  Incisions are closed with skin adhesive.    Patient goes to recovery room in stable condition.  All sponge, instrument,  and needle counts are correct x2.     Annamarie Major, MD,  [facility], [facility] 11/27/2019  2:01 PM

## 2019-11-27 NOTE — Anesthesia Preprocedure Evaluation (Signed)
Anesthesia Evaluation  Patient identified by MRN, date of birth, ID band Patient awake    Reviewed: Allergy & Precautions, NPO status , Patient's Chart, lab work & pertinent test results  History of Anesthesia Complications Negative for: history of anesthetic complications  Airway Mallampati: II  TM Distance: >3 FB Neck ROM: Full    Dental  (+) Poor Dentition, Chipped   Pulmonary asthma (uses daily controller) , former smoker,    breath sounds clear to auscultation- rhonchi (-) wheezing      Cardiovascular Exercise Tolerance: Good (-) hypertension(-) CAD, (-) Past MI, (-) Cardiac Stents and (-) CABG  Rhythm:Regular Rate:Normal - Systolic murmurs and - Diastolic murmurs    Neuro/Psych  Headaches, Seizures - (nonepileptic, pt states anxiety related),  PSYCHIATRIC DISORDERS Anxiety Depression Bipolar Disorder    GI/Hepatic Neg liver ROS, GERD  ,  Endo/Other  negative endocrine ROSneg diabetes  Renal/GU negative Renal ROS     Musculoskeletal negative musculoskeletal ROS (+)   Abdominal (+) - obese,   Peds  Hematology negative hematology ROS (+)   Anesthesia Other Findings Past Medical History: No date: Allergy No date: Anxiety 11/24/2019: Asthma     Comment:  blood allergy testing done 11/24/19 to see what causes it               to flare up No date: Bipolar 1 disorder (HCC) No date: Bipolar disorder (HCC) No date: Chronic cough     Comment:  being worked up with Dr. Jayme Cloud No date: Depression 11/2019: Dyspareunia in female No date: Dysrhythmia     Comment:  TACHY BUT NOT A PROBLEM No date: Ectopic pregnancy     Comment:  had surgery No date: GERD (gastroesophageal reflux disease) 2016: History of concussion No date: Hyperlipidemia No date: Migraine     Comment:  has an aura. happening frequently No date: MRSA (methicillin resistant Staphylococcus aureus)     Comment:  was on chin.  many years ago No date:  NSVD (normal spontaneous vaginal delivery)     Comment:  x 2 No date: Poor dentition No date: Pseudoseizures     Comment:  related to anxiety No date: Scoliosis No date: Scoliosis   Reproductive/Obstetrics                             Anesthesia Physical Anesthesia Plan  ASA: II  Anesthesia Plan: General   Post-op Pain Management:    Induction: Intravenous  PONV Risk Score and Plan: 2 and Ondansetron, Dexamethasone and Midazolam  Airway Management Planned: Oral ETT  Additional Equipment:   Intra-op Plan:   Post-operative Plan: Extubation in OR  Informed Consent: I have reviewed the patients History and Physical, chart, labs and discussed the procedure including the risks, benefits and alternatives for the proposed anesthesia with the patient or authorized representative who has indicated his/her understanding and acceptance.     Dental advisory given  Plan Discussed with: CRNA and Anesthesiologist  Anesthesia Plan Comments:         Anesthesia Quick Evaluation

## 2019-11-27 NOTE — Interval H&P Note (Signed)
History and Physical Interval Note:  11/27/2019 11:44 AM  [name]  has presented today for surgery, with the diagnosis of Dysmenorrhea , Pelvic Pain, Dyspareunia due to medical conditionin female, Right ovarian cyst.  The various methods of treatment have been discussed with the patient and family. After consideration of risks, benefits and other options for treatment, the patient has consented to  Procedure(s) with comments: TOTAL LAPAROSCOPIC HYSTERECTOMY WITH SALPINGECTOMY (Bilateral) - as a surgical intervention.  The patient's history has been reviewed, patient examined, no change in status, stable for surgery.  I have reviewed the patient's chart and labs.  Questions were answered to the patient's satisfaction.     Letitia Libra

## 2019-12-02 ENCOUNTER — Telehealth: Payer: Self-pay

## 2019-12-02 LAB — SURGICAL PATHOLOGY

## 2019-12-02 NOTE — Telephone Encounter (Signed)
Lm to relay date/time of covid test prior to PFT. 12/04/2019 prior to [facility]  

## 2019-12-02 NOTE — Telephone Encounter (Signed)
Pt is aware of date/time of covid test. Nothing further is needed.  

## 2019-12-03 ENCOUNTER — Other Ambulatory Visit: Payer: Self-pay | Admitting: Obstetrics & Gynecology

## 2019-12-03 ENCOUNTER — Telehealth: Payer: Self-pay

## 2019-12-03 MED ORDER — FLUCONAZOLE 150 MG PO TABS
150.0000 mg | ORAL_TABLET | Freq: Once | ORAL | 3 refills | Status: AC
Start: 2019-12-03 — End: 2019-12-03

## 2019-12-03 NOTE — Telephone Encounter (Signed)
Will call in

## 2019-12-03 NOTE — Telephone Encounter (Signed)
Pt left msg on triage line saying she is pretty sure she has a yeast inf since she has had them in the past, she currently has discharge, itchiness, burning-started two days ago. She wants to know if something can be called in to her pharmacy.

## 2019-12-04 ENCOUNTER — Other Ambulatory Visit
Admission: RE | Admit: 2019-12-04 | Discharge: 2019-12-04 | Disposition: A | Discharge status: Home / Self Care | Payer: Commercial Managed Care - PPO | Source: Ambulatory Visit | Attending: Pulmonary Disease | Admitting: Pulmonary Disease

## 2019-12-04 ENCOUNTER — Other Ambulatory Visit: Payer: Self-pay

## 2019-12-04 DIAGNOSIS — Z01812 Encounter for preprocedural laboratory examination: Secondary | ICD-10-CM | POA: Diagnosis not present

## 2019-12-04 DIAGNOSIS — Z20822 Contact with and (suspected) exposure to covid-19: Secondary | ICD-10-CM | POA: Diagnosis not present

## 2019-12-04 LAB — SARS CORONAVIRUS 2 (TAT 6-24 HRS): SARS Coronavirus 2: NEGATIVE

## 2019-12-04 NOTE — Telephone Encounter (Signed)
Pt aware.

## 2019-12-07 ENCOUNTER — Ambulatory Visit: Payer: Commercial Managed Care - PPO | Attending: Pulmonary Disease

## 2019-12-07 ENCOUNTER — Other Ambulatory Visit: Payer: Self-pay

## 2019-12-07 DIAGNOSIS — R05 Cough: Secondary | ICD-10-CM | POA: Diagnosis present

## 2019-12-07 DIAGNOSIS — R059 Cough, unspecified: Secondary | ICD-10-CM

## 2019-12-07 DIAGNOSIS — J45909 Unspecified asthma, uncomplicated: Secondary | ICD-10-CM

## 2019-12-09 ENCOUNTER — Telehealth: Payer: Self-pay

## 2019-12-09 ENCOUNTER — Ambulatory Visit: Payer: Commercial Managed Care - PPO | Admitting: Obstetrics & Gynecology

## 2019-12-09 NOTE — Telephone Encounter (Signed)
Pt calling; had appt today at 4:10 but PH had emergency; pt wasn't able to get another appt until 7/2nd.  Needs a doctor's note to be out of work until [employer] b/c of lift restriction - work requires her to lift 10lbs repeatedly.  Also pt will be bringing FMLA papers to the office.  [phone]

## 2019-12-09 NOTE — Telephone Encounter (Signed)
okay

## 2019-12-10 NOTE — Telephone Encounter (Signed)
Pt aware note for work is ready.  She would like it faxed to [facility] and would like a copy.  Adv will do.

## 2019-12-16 ENCOUNTER — Other Ambulatory Visit (HOSPITAL_COMMUNITY): Payer: Self-pay | Admitting: Psychiatry

## 2019-12-16 ENCOUNTER — Telehealth: Payer: Self-pay | Admitting: Obstetrics & Gynecology

## 2019-12-16 ENCOUNTER — Other Ambulatory Visit: Payer: Self-pay

## 2019-12-16 ENCOUNTER — Telehealth (HOSPITAL_COMMUNITY): Payer: Commercial Managed Care - PPO | Admitting: Psychiatry

## 2019-12-16 DIAGNOSIS — F3175 Bipolar disorder, in partial remission, most recent episode depressed: Secondary | ICD-10-CM

## 2019-12-16 MED ORDER — TRAZODONE HCL 100 MG PO TABS: 100.0000 mg | ORAL_TABLET | Freq: Every evening | ORAL | 0 refills | Status: DC | PRN

## 2019-12-16 MED ORDER — FLUOXETINE HCL 20 MG PO CAPS: 20.0000 mg | ORAL_CAPSULE | Freq: Every day | ORAL | 0 refills | Status: DC

## 2019-12-16 MED ORDER — DIVALPROEX SODIUM ER 500 MG PO TB24: 500.0000 mg | ORAL_TABLET | Freq: Every day | ORAL | 0 refills | Status: DC

## 2019-12-16 NOTE — Telephone Encounter (Signed)
Patient is calling to follow up on her FMLA. Patient states her company is needing [place] to fax over our office notes. Please advise

## 2019-12-17 NOTE — Telephone Encounter (Signed)
Tried to call pt to let her know notes were sent with paperwork. Please let me know when she calls back. Need to know what else they need

## 2019-12-21 ENCOUNTER — Telehealth (INDEPENDENT_AMBULATORY_CARE_PROVIDER_SITE_OTHER): Payer: Commercial Managed Care - PPO | Admitting: Psychiatry

## 2019-12-21 ENCOUNTER — Other Ambulatory Visit: Payer: Self-pay

## 2019-12-21 DIAGNOSIS — F3181 Bipolar II disorder: Secondary | ICD-10-CM

## 2019-12-21 NOTE — Progress Notes (Signed)
BH MD/PA/NP OP Progress Note  12/21/2019 11:38 AM [name]  MRN:  [id number] Interview was conducted by phone and I verified that I was speaking with the correct person using two identifiers. I discussed the limitations of evaluation and management by telemedicine and  the availability of in person appointments. Patient expressed understanding and agreed to proceed. Patient location - home; physician - home office.  Chief Complaint: Feeling more depressed.  HPI: 34 yo separated white female with long hx of treatment for disorder who comes to establish regular psychiatric follow up.. She Ross been diagnosed and treated for this condition since age 34. She Ross a hx of manic episodes lastingfew (not longer than5)days which include increased energy, decreased need for sleep, racing thoughts, distractibility, increase in activity/restlessness. They have been disruptive to her daily functioning but she did not need to quit her job because of these in the past. She would have 5-6 of such episodes at the most per year. They are followed by depressive episodes which are much longer (one month or so) and were often severe enough that she felt suicidal and needed to be hospitalized. She Ross been in hospital 6 times: 5 as a teenager last one in her 60s. She denies having hx of suicidal attempts or psychosis. Sh does not abuse alcohol or drugs. She Ross been on a combination of fluoxetine and divalproex on and off for many years - it seems to work relatively well. She Ross been followed by 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 She had a break in treatment and was restarted on both medications in May 2020. Valproic acid level on 250 mg was 33 mcg/ml and dose was then doubled. In the past she was on 500 mg extended release at bedtime - it was helping with middle insomniaso we changed it to this dose/form. Her sleep did not improve much until we added  trazodone. Earlier she tried Seroquel (excessively sedated) and zolpidem (did not help and actually appeared to be activating) in the past. [name] changed her works schedule - now works 3rd shift. She had hysterectomy on 11/27/19 and noticed that her mood declined since - more depressed.  Visit Diagnosis:    ICD-10-CM   1. Bipolar 2 disorder (HCC)  F31.81     Past Psychiatric History: Please see intake H&P.  Past Medical History:  Past Medical History:  Diagnosis Date  . Allergy   . Anxiety   . Asthma 11/24/2019   blood allergy testing done 11/24/19 to see what causes it to flare up  . Bipolar 1 disorder (HCC)   . Bipolar disorder (HCC)   . Chronic cough    being worked up with Dr. Jayme Cloud  . Depression   . Dyspareunia in female 11/2019  . Dysrhythmia    TACHY BUT NOT A PROBLEM  . Ectopic pregnancy    had surgery  . GERD (gastroesophageal reflux disease)   . History of concussion 2016  . Hyperlipidemia   . Migraine    Ross an aura. happening frequently  . MRSA (methicillin resistant Staphylococcus aureus)    was on chin.  many years ago  . NSVD (normal spontaneous vaginal delivery)    x 2  . Poor dentition   . Pseudoseizures    related to anxiety  . Scoliosis   . Scoliosis     Past Surgical History:  Procedure Laterality Date  . CARPAL TUNNEL RELEASE  2015   left  . CYSTOSCOPY  11/27/2019   Procedure: CYSTOSCOPY;  Surgeon: Nadara Mustard, MD;  Location: [facility] ORS;  Service: Gynecology;;  . ECTOPIC PREGNANCY SURGERY  2011  . HERNIA REPAIR  2010  . TOTAL LAPAROSCOPIC HYSTERECTOMY WITH SALPINGECTOMY Bilateral 11/27/2019   Procedure: TOTAL LAPAROSCOPIC HYSTERECTOMY WITH SALPINGECTOMY;  Surgeon: Nadara Mustard, MD;  Location: [facility] ORS;  Service: Gynecology;  Laterality: Bilateral;  needs RNFA  . TUBAL LIGATION      Family Psychiatric History: Reviewed.  Family History:  Family History  Problem Relation Age of Onset  . Asthma Mother   . Heart disease Mother   .  Diabetes Mother   . Hyperlipidemia Mother   . Hypertension Mother   . Mental illness Mother   . Migraines Mother   . Thyroid disease Mother   . Stroke Mother   . Alcohol abuse Mother   . Bipolar disorder Mother   . Anxiety disorder Mother   . COPD Mother   . Hypertension Sister   . Depression Sister   . Drug abuse Sister   . Cancer Maternal Grandmother   . Diabetes Maternal Grandmother   . Heart disease Maternal Grandmother   . Hyperlipidemia Maternal Grandmother   . Hypertension Maternal Grandmother   . Kidney disease Maternal Grandmother   . Heart disease Paternal Grandfather   . Alcohol abuse Paternal Grandfather   . Drug abuse Paternal Grandfather     Social History:  Social History   Socioeconomic History  . Marital status: Single    Spouse name: Not on file  . Number of children: 2  . Years of education: Not on file  . Highest education level: GED or equivalent  Occupational History  . Not on file  Tobacco Use  . Smoking status: Former Smoker    Packs/day: 1.00    Years: 15.00    Pack years: 15.00    Types: Cigarettes    Start date: 09/07/2019    Quit date: 08/2019    Years since quitting: 0.3  . Smokeless tobacco: Never Used  . Tobacco comment: did not want to quit but can't inhale w/ coughing  Vaping Use  . Vaping Use: Never used  Substance and Sexual Activity  . Alcohol use: No    Alcohol/week: 0.0 standard drinks  . Drug use: No  . Sexual activity: Yes    Partners: Male    Birth control/protection: Surgical, None  Other Topics Concern  . Not on file  Social History Narrative   Lives with fiance and 2 kids.   Social Determinants of Health   Financial Resource Strain: Medium Risk  . Difficulty of Paying Living Expenses: Somewhat hard  Food Insecurity: Food Insecurity Present  . Worried About Programme researcher, broadcasting/film/video in the Last Year: Often true  . Ran Out of Food in the Last Year: Never true  Transportation Needs: No Transportation Needs  . Lack  of Transportation (Medical): No  . Lack of Transportation (Non-Medical): No  Physical Activity: Sufficiently Active  . Days of Exercise per Week: 5 days  . Minutes of Exercise per Session: 80 min  Stress: Stress Concern Present  . Feeling of Stress : Very much  Social Connections: Moderately Isolated  . Frequency of Communication with Friends and Family: More than three times a week  . Frequency of Social Gatherings with Friends and Family: Once a week  . Attends Religious Services: Never  . Active Member of Clubs or Organizations: No  .  Attends BankerClub or Organization Meetings: Never  . Marital Status: Living with partner    Allergies:  Allergies  Allergen Reactions  . Robitussin (Alcohol Free) [Guaifenesin] Anaphylaxis  . Strawberry Extract Rash    CAN'T EVEN TOUCH STRAWBERRIES  . Tramadol     Due to Seizures per patient  . Aspirin Other (See Comments)    Nose bleeds  . Chocolate Flavor Rash  . Codeine Rash    Metabolic Disorder Labs: Lab Results  Component Value Date   HGBA1C 5.1 11/12/2019   MPG 100 11/12/2019   No results found for: PROLACTIN Lab Results  Component Value Date   CHOL 192 11/12/2019   TRIG 102 11/12/2019   HDL 46 (L) 11/12/2019   CHOLHDL 4.2 11/12/2019   LDLCALC 126 (H) 11/12/2019   Lab Results  Component Value Date   TSH 1.760 03/08/2015    Therapeutic Level Labs: No results found for: LITHIUM Lab Results  Component Value Date   VALPROATE 33 (L) 12/05/2018   VALPROATE 25 (L) 03/08/2015   No components found for:  CBMZ  Current Medications: Current Outpatient Medications  Medication Sig Dispense Refill  . albuterol (VENTOLIN HFA) 108 (90 Base) MCG/ACT inhaler Inhale 2 puffs into the lungs every 4 (four) hours as needed for wheezing or shortness of breath. 18 g 0  . benzonatate (TESSALON) 200 MG capsule Take 1 capsule (200 mg total) by mouth 3 (three) times daily as needed for cough. 100 capsule 0  . divalproex (DEPAKOTE ER) 500 MG 24 hr  tablet Take 1 tablet (500 mg total) by mouth at bedtime. 90 tablet 0  . famotidine (PEPCID) 20 MG tablet Take 1 tablet (20 mg total) by mouth 2 (two) times daily. (Patient taking differently: Take 20 mg by mouth at bedtime. ) 60 tablet 0  . FLUoxetine (PROZAC) 20 MG capsule Take 1 capsule (20 mg total) by mouth daily. 90 capsule 0  . fluticasone (FLONASE) 50 MCG/ACT nasal spray Place 1 spray into both nostrils 2 (two) times daily. 16 g 0  . Fluticasone-Umeclidin-Vilant (TRELEGY ELLIPTA) 200-62.5-25 MCG/INH AEPB Inhale 1 puff into the lungs daily. 28 each 11  . ibuprofen (ADVIL) 600 MG tablet Take 1 tablet (600 mg total) by mouth every 6 (six) hours as needed. 30 tablet 0  . Multiple Vitamins-Minerals (MULTIVITAMIN WITH MINERALS) tablet Take 1 tablet by mouth daily.    Marland Kitchen. omeprazole (PRILOSEC) 20 MG capsule Take 1 capsule (20 mg total) by mouth 2 (two) times daily. 60 capsule 2  . oxyCODONE-acetaminophen (PERCOCET/ROXICET) 5-325 MG tablet Take 1 tablet by mouth every 4 (four) hours as needed for moderate pain. 30 tablet 0  . Spacer/Aero-Holding Chambers (AEROCHAMBER PLUS) inhaler Use as instructed 1 each 2  . traZODone (DESYREL) 100 MG tablet Take 1 tablet (100 mg total) by mouth at bedtime as needed for sleep. 90 tablet 0  . vitamin B-12 (CYANOCOBALAMIN) 1000 MCG tablet Take 1,000 mcg by mouth daily.     No current facility-administered medications for this visit.      Psychiatric Specialty Exam: Review of Systems  All other systems reviewed and are negative.   There were no vitals taken for this visit.There is no height or weight on file to calculate BMI.  General Appearance: NA  Eye Contact:  NA  Speech:  Clear and Coherent and Normal Rate  Volume:  Normal  Mood:  Depressed  Affect:  NA  Thought Process:  Goal Directed and Linear  Orientation:  Full (Time, Place, and  Person)  Thought Content: Logical   Suicidal Thoughts:  No  Homicidal Thoughts:  No  Memory:  Immediate;    Good Recent;   Good Remote;   Good  Judgement:  Good  Insight:  Good  Psychomotor Activity:  NA  Concentration:  Concentration: Good  Recall:  Good  Fund of Knowledge: Good  Language: Good  Akathisia:  Negative  Handed:  Right  AIMS (if indicated): not done  Assets:  Communication Skills Desire for Improvement Financial Resources/Insurance Housing Resilience Talents/Skills  ADL's:  Intact  Cognition: WNL  Sleep:  Good   Screenings: GAD-7     [facility] Visit from 11/12/2019 in [facility] Office Visit from 11/14/2018 in [facility] Office Visit from 10/09/2018 in [facility]  Total GAD-7 Score 12 14 15     PHQ2-9     Office Visit from 11[facility]12/2019 in [facility] Office Visit from 10[facility]13/2019 in [facility] Office Visit from 07/08/2019 in [facility] Office Visit from 12/17/2018 in [facility] Office Visit from 12/08/2018 in [facility]  PHQ-2 Total Score 3 0 0 5 2  PHQ-9 Total Score 13 4 0 17 18       Assessment and Plan: 34 yo separated white female with long hx of treatment for disorder who comes to establish regular psychiatric follow up.. She Ross been diagnosed and treated for this condition since age 42. She Ross a hx of manic episodes lastingfew (not longer than5)days which include increased energy, decreased need for sleep, racing thoughts, distractibility, increase in activity/restlessness. They have been disruptive to her daily functioning but she did not need to quit her job because of these in the past. She would have 5-6 of such episodes at the most per year. They are followed by depressive episodes which are much longer (one month or so) and were often severe enough that she felt suicidal and needed to be hospitalized. She Ross been in hospital 6 times: 5 as a teenager last one in her 56s. She denies having hx of suicidal  attempts or psychosis. Sh does not abuse alcohol or drugs. She Ross been on a combination of fluoxetine and divalproex on and off for many years - it seems to work relatively well. She Ross been followed by Dr. 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 Valproic acid level on 250 mg was 33 mcg/ml and dose was then doubled. In the past she was on 500 mg extended release at bedtime - it was helping with middle insomniaso we changed it to this dose/form. Her sleep did not improve much until we added trazodone. Earlier she tried Seroquel (excessively sedated) and zolpidem (did not help and actually appeared to be activating) in the past. [name] Ross changed her works schedule - now works 3rd shift. She had hysterectomy on 11/27/19 and noticed that her mood declined since - more depressed.  Dx: Bipolar2disorder rapid cycling, depressed  PlanContinuefluoxetine but increase dose to 40 mgdaily, Depakote ER 500 mg at HS and trazodone for insomnia - she typically takes 50 mg.If her mood does not improve we may try lurasidone. Next appointment inone month.We will recheck valproic acid prior to next appointment.The plan was discussed with patient who had an opportunity to ask questions and these were all answered. I  spend8minutes inphone consultationwith the patient.   Magdalene Patricia, MD 12/21/2019, 11:38 AM

## 2019-12-22 ENCOUNTER — Other Ambulatory Visit (HOSPITAL_COMMUNITY): Payer: Self-pay | Admitting: *Deleted

## 2019-12-22 ENCOUNTER — Telehealth (HOSPITAL_COMMUNITY): Payer: Self-pay | Admitting: *Deleted

## 2019-12-22 DIAGNOSIS — F3175 Bipolar disorder, in partial remission, most recent episode depressed: Secondary | ICD-10-CM

## 2019-12-22 NOTE — Telephone Encounter (Signed)
Please let me know when pt calls back. I cannot get in touch with her. I have no received a request for office notes, I need to know what exactly they are needing

## 2019-12-22 NOTE — Telephone Encounter (Signed)
Orders sent to [facility] [place]. Pt aware and verbalizes understanding. Address and phone number given to pt.

## 2019-12-25 ENCOUNTER — Encounter: Payer: Self-pay | Admitting: Obstetrics & Gynecology

## 2019-12-25 ENCOUNTER — Other Ambulatory Visit: Payer: Self-pay

## 2019-12-25 ENCOUNTER — Ambulatory Visit (INDEPENDENT_AMBULATORY_CARE_PROVIDER_SITE_OTHER): Payer: Commercial Managed Care - PPO | Admitting: Obstetrics & Gynecology

## 2019-12-25 VITALS — BP 118/70 | Ht 65.0 in | Wt 165.0 lb

## 2019-12-25 DIAGNOSIS — Z90710 Acquired absence of both cervix and uterus: Secondary | ICD-10-CM

## 2019-12-25 NOTE — Progress Notes (Signed)
  Postoperative Follow-up Patient presents post op from [facility] for pelvic pain, 1 month ago. Path: DIAGNOSIS:  A. UTERUS WITH CERVIX AND BILATERAL TUBES; TOTAL HYSTERECTOMY WITH  BILATERAL SALPINGECTOMY:  - CERVIX:    - NEGATIVE FOR DYSPLASIA AND MALIGNANCY.  - ENDOMETRIUM:    - SECRETORY. NEGATIVE FOR ATYPIA / EIN AND MALIGNANCY.  - MYOMETRIUM:    - NO SIGNIFICANT PATHOLOGIC ALTERATION.  - RIGHT FALLOPIAN TUBE:    - SEROSAL ADHESIONS TO SMALL PORTION OF OVARIAN TISSUE.  - LEFT FALLOPIAN TUBE:    - NO SIGNIFICANT PATHOLOGIC ALTERATION.  Subjective: Patient reports some improvement in her preop symptoms. Eating a regular diet without difficulty. The patient is not having any pain.  Activity: normal activities of daily living. Patient reports additional symptom's since surgery of No bleeding  Objective: BP 118/70   Ht 5\' 5"  (1.651 m)   Wt 165 lb (74.8 kg)   LMP 11/09/2019 (Exact Date)   BMI 27.46 kg/m  Physical Exam Constitutional:      General: She is not in acute distress.    Appearance: She is well-developed.  Genitourinary:     Pelvic exam was performed with patient supine.     Vagina and rectum normal.     No vaginal erythema or bleeding.     No right or left adnexal mass present.     Right adnexa not tender.     Left adnexa not tender.     Genitourinary Comments: Cervix and uterus absent. Vaginal cuff healing well.  Cardiovascular:     Rate and Rhythm: Normal rate.  Pulmonary:     Effort: Pulmonary effort is normal.  Abdominal:     General: There is no distension.     Palpations: Abdomen is soft.     Tenderness: There is no abdominal tenderness.     Comments: Incision healing well.  Musculoskeletal:        General: Normal range of motion.  Neurological:     Mental Status: She is alert and oriented to person, place, and time.     Cranial Nerves: No cranial nerve deficit.  Skin:    General: Skin is warm and dry.     Assessment: s/p :  total  laparoscopic hysterectomy with bilateral salpingectomy stable  Plan: Patient has done well after surgery with no apparent complications.  I have discussed the post-operative course to date, and the expected progress moving forward.  The patient understands what complications to be concerned about.  I will see the patient in routine follow up, or sooner if needed.    Activity plan: No restriction except  Pelvic rest for 3 more weeks.  Return for annual visits.  11/11/2019 12/25/2019, 2:43 PM

## 2020-01-07 ENCOUNTER — Ambulatory Visit: Payer: Commercial Managed Care - PPO | Admitting: Pulmonary Disease

## 2020-01-08 ENCOUNTER — Other Ambulatory Visit: Payer: Self-pay

## 2020-01-08 ENCOUNTER — Ambulatory Visit (INDEPENDENT_AMBULATORY_CARE_PROVIDER_SITE_OTHER): Payer: Commercial Managed Care - PPO | Admitting: Obstetrics & Gynecology

## 2020-01-08 ENCOUNTER — Encounter: Payer: Self-pay | Admitting: Obstetrics & Gynecology

## 2020-01-08 VITALS — BP 120/80 | Ht 65.0 in | Wt 162.0 lb

## 2020-01-08 DIAGNOSIS — Z90710 Acquired absence of both cervix and uterus: Secondary | ICD-10-CM

## 2020-01-08 NOTE — Patient Instructions (Signed)
Thank you for choosing [facility]. As part of our ongoing efforts to improve patient experience, we would appreciate your feedback. Please fill out the short survey that you will receive by mail or MyChart. Your opinion is important to us! -Dr Jenkins Risdon  

## 2020-01-08 NOTE — Progress Notes (Signed)
°  Postoperative Follow-up Patient presents post op from [facility] for pelvic pain, 6 weeks ago.  Subjective: Patient reports marked improvement in her preop symptoms. Eating a regular diet without difficulty. The patient is not having any pain.  Activity: normal activities of daily living. Patient reports additional symptom's since surgery of None.  Objective: BP 120/80    Ht 5\' 5"  (1.651 m)    Wt 162 lb (73.5 kg)    LMP 11/09/2019 (Exact Date)    BMI 26.96 kg/m  Physical Exam Constitutional:      General: She is not in acute distress.    Appearance: She is well-developed.  Genitourinary:     Pelvic exam was performed with patient supine.     Vagina and rectum normal.     No vaginal erythema or bleeding.     No right or left adnexal mass present.     Right adnexa not tender.     Left adnexa not tender.     Genitourinary Comments: Cervix and uterus absent. Vaginal cuff healing well.  Cardiovascular:     Rate and Rhythm: Normal rate.  Pulmonary:     Effort: Pulmonary effort is normal.  Abdominal:     General: There is no distension.     Palpations: Abdomen is soft.     Tenderness: There is no abdominal tenderness.     Comments: Incision healing well.  Musculoskeletal:        General: Normal range of motion.  Neurological:     Mental Status: She is alert and oriented to person, place, and time.     Cranial Nerves: No cranial nerve deficit.  Skin:    General: Skin is warm and dry.     Assessment: s/p :  total laparoscopic hysterectomy with bilateral salpingectomy progressing well  Plan: Patient has done well after surgery with no apparent complications.  I have discussed the post-operative course to date, and the expected progress moving forward.  The patient understands what complications to be concerned about.  I will see the patient in routine follow up, or sooner if needed.    Activity plan: No restriction.  11/11/2019 01/08/2020, 9:07 AM

## 2020-01-26 ENCOUNTER — Other Ambulatory Visit: Payer: Self-pay

## 2020-01-26 ENCOUNTER — Telehealth (INDEPENDENT_AMBULATORY_CARE_PROVIDER_SITE_OTHER): Payer: Self-pay | Admitting: Psychiatry

## 2020-01-26 DIAGNOSIS — F3175 Bipolar disorder, in partial remission, most recent episode depressed: Secondary | ICD-10-CM

## 2020-01-26 DIAGNOSIS — F3181 Bipolar II disorder: Secondary | ICD-10-CM

## 2020-01-26 MED ORDER — FLUOXETINE HCL 40 MG PO CAPS: 40.0000 mg | ORAL_CAPSULE | Freq: Every day | ORAL | 0 refills | Status: DC

## 2020-01-26 MED ORDER — ARIPIPRAZOLE 5 MG PO TABS: 5.0000 mg | ORAL_TABLET | Freq: Every day | ORAL | 1 refills | Status: DC

## 2020-01-26 NOTE — Progress Notes (Addendum)
BH MD/PA/NP OP Progress Note  01/26/2020 3:10 PM Danielle HummerHeather M Ross  MRN:  [id number] Interview was conducted by phone and I verified that I was speaking with the correct person using two identifiers. I discussed the limitations of evaluation and management by telemedicine and  the availability of in person appointments. Patient expressed understanding and agreed to proceed. Patient location - home; physician - home office.  Chief Complaint: Feeling better but still depressed.  HPI: 34 yo separated white female with long hx of treatment for bipolar disorder. She would have 5-6 of hypomanic episodes at the most per year. They are followed by depressive episodes which are much longer (one month or so) and were often severe enough that she felt suicidal and needed to be hospitalized. She has been in hospital 6 times: 5 as a teenager last one in her 6720s. She denies having hx of suicidal attempts or psychosis. She has been on a combination of fluoxetine and divalproex on and off for many years - it seems to work relatively well. She has 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 She had a break in treatment and was restarted on both medications in May 2020. Valproic acid level on 250 mg was 33 mcg/ml and dose was then doubled. In the past she was on 500 mg extended release at bedtime - it was helping with middle insomniaso we changed it to this dose/form. Her sleep did not improve much until we added trazodone. Earlier she tried Seroquel (excessively sedated) and zolpidem (did not help and actually appeared to be activating) in the past.[name] has changed her works schedule - now works 3rd shift. She had hysterectomy on 11/27/19 and noticed that her mood declined since - more depressed. We have increased fluoxetine to 40 mg and mood has improved some but is not fully in remission.  Visit Diagnosis:    ICD-10-CM   1. Bipolar 2 disorder (HCC)   F31.81   2. Bipolar disorder, in partial remission, most recent episode depressed (HCC)  F31.75 FLUoxetine (PROZAC) 40 MG capsule    Past Psychiatric History: Please see intake H&P.  Past Medical History:  Past Medical History:  Diagnosis Date  . Allergy   . Anxiety   . Asthma 11/24/2019   blood allergy testing done 11/24/19 to see what causes it to flare up  . Bipolar 1 disorder (HCC)   . Bipolar disorder (HCC)   . Chronic cough    being worked up with Dr. Jayme CloudGonzalez  . Depression   . Dyspareunia in female 11/2019  . Dysrhythmia    TACHY BUT NOT A PROBLEM  . Ectopic pregnancy    had surgery  . GERD (gastroesophageal reflux disease)   . History of concussion 2016  . Hyperlipidemia   . Migraine    has an aura. happening frequently  . MRSA (methicillin resistant Staphylococcus aureus)    was on chin.  many years ago  . NSVD (normal spontaneous vaginal delivery)    x 2  . Poor dentition   . Pseudoseizures    related to anxiety  . Scoliosis   . Scoliosis     Past Surgical History:  Procedure Laterality Date  . CARPAL TUNNEL RELEASE  2015   left  . CYSTOSCOPY  11/27/2019   Procedure: CYSTOSCOPY;  Surgeon: Nadara MustardHarris, Robert P, MD;  Location: [facility] ORS;  Service: Gynecology;;  . ECTOPIC PREGNANCY SURGERY  2011  .  HERNIA REPAIR  2010  . TOTAL LAPAROSCOPIC HYSTERECTOMY WITH SALPINGECTOMY Bilateral 11/27/2019   Procedure: TOTAL LAPAROSCOPIC HYSTERECTOMY WITH SALPINGECTOMY;  Surgeon: Nadara Mustard, MD;  Location: [facility] ORS;  Service: Gynecology;  Laterality: Bilateral;  needs RNFA  . TUBAL LIGATION      Family Psychiatric History: Reviewed.  Family History:  Family History  Problem Relation Age of Onset  . Asthma Mother   . Heart disease Mother   . Diabetes Mother   . Hyperlipidemia Mother   . Hypertension Mother   . Mental illness Mother   . Migraines Mother   . Thyroid disease Mother   . Stroke Mother   . Alcohol abuse Mother   . Bipolar disorder Mother   . Anxiety  disorder Mother   . COPD Mother   . Hypertension Sister   . Depression Sister   . Drug abuse Sister   . Cancer Maternal Grandmother   . Diabetes Maternal Grandmother   . Heart disease Maternal Grandmother   . Hyperlipidemia Maternal Grandmother   . Hypertension Maternal Grandmother   . Kidney disease Maternal Grandmother   . Heart disease Paternal Grandfather   . Alcohol abuse Paternal Grandfather   . Drug abuse Paternal Grandfather     Social History:  Social History   Socioeconomic History  . Marital status: Single    Spouse name: Not on file  . Number of children: 2  . Years of education: Not on file  . Highest education level: GED or equivalent  Occupational History  . Not on file  Tobacco Use  . Smoking status: Former Smoker    Packs/day: 1.00    Years: 15.00    Pack years: 15.00    Types: Cigarettes    Start date: 09/07/2019    Quit date: 08/2019    Years since quitting: 0.4  . Smokeless tobacco: Never Used  . Tobacco comment: did not want to quit but can't inhale w/ coughing  Vaping Use  . Vaping Use: Never used  Substance and Sexual Activity  . Alcohol use: No    Alcohol/week: 0.0 standard drinks  . Drug use: No  . Sexual activity: Yes    Partners: Male    Birth control/protection: Surgical, None  Other Topics Concern  . Not on file  Social History Narrative   Lives with fiance and 2 kids.   Social Determinants of Health   Financial Resource Strain: Medium Risk  . Difficulty of Paying Living Expenses: Somewhat hard  Food Insecurity: Food Insecurity Present  . Worried About Programme researcher, broadcasting/film/video in the Last Year: Often true  . Ran Out of Food in the Last Year: Never true  Transportation Needs: No Transportation Needs  . Lack of Transportation (Medical): No  . Lack of Transportation (Non-Medical): No  Physical Activity: Sufficiently Active  . Days of Exercise per Week: 5 days  . Minutes of Exercise per Session: 80 min  Stress: Stress Concern Present   . Feeling of Stress : Very much  Social Connections: Moderately Isolated  . Frequency of Communication with Friends and Family: More than three times a week  . Frequency of Social Gatherings with Friends and Family: Once a week  . Attends Religious Services: Never  . Active Member of Clubs or Organizations: No  . Attends Banker Meetings: Never  . Marital Status: Living with partner    Allergies:  Allergies  Allergen Reactions  . Robitussin (Alcohol Free) [Guaifenesin] Anaphylaxis  . Strawberry Extract Rash  CAN'T EVEN TOUCH STRAWBERRIES  . Tramadol     Due to Seizures per patient  . Aspirin Other (See Comments)    Nose bleeds  . Chocolate Flavor Rash  . Codeine Rash    Metabolic Disorder Labs: Lab Results  Component Value Date   HGBA1C 5.1 11/12/2019   MPG 100 11/12/2019   No results found for: PROLACTIN Lab Results  Component Value Date   CHOL 192 11/12/2019   TRIG 102 11/12/2019   HDL 46 (L) 11/12/2019   CHOLHDL 4.2 11/12/2019   LDLCALC 126 (H) 11/12/2019   Lab Results  Component Value Date   TSH 1.760 03/08/2015    Therapeutic Level Labs: No results found for: LITHIUM Lab Results  Component Value Date   VALPROATE 33 (L) 12/05/2018   VALPROATE 25 (L) 03/08/2015   No components found for:  CBMZ  Current Medications: Current Outpatient Medications  Medication Sig Dispense Refill  . albuterol (VENTOLIN HFA) 108 (90 Base) MCG/ACT inhaler Inhale 2 puffs into the lungs every 4 (four) hours as needed for wheezing or shortness of breath. 18 g 0  . ARIPiprazole (ABILIFY) 5 MG tablet Take 1 tablet (5 mg total) by mouth daily. 30 tablet 1  . benzonatate (TESSALON) 200 MG capsule Take 1 capsule (200 mg total) by mouth 3 (three) times daily as needed for cough. 100 capsule 0  . divalproex (DEPAKOTE ER) 500 MG 24 hr tablet Take 1 tablet (500 mg total) by mouth at bedtime. 90 tablet 0  . FLUoxetine (PROZAC) 40 MG capsule Take 1 capsule (40 mg total) by  mouth daily. 90 capsule 0  . fluticasone (FLONASE) 50 MCG/ACT nasal spray Place 1 spray into both nostrils 2 (two) times daily. 16 g 0  . Fluticasone-Umeclidin-Vilant (TRELEGY ELLIPTA) 200-62.5-25 MCG/INH AEPB Inhale 1 puff into the lungs daily. 28 each 11  . ibuprofen (ADVIL) 600 MG tablet Take 1 tablet (600 mg total) by mouth every 6 (six) hours as needed. 30 tablet 0  . Multiple Vitamins-Minerals (MULTIVITAMIN WITH MINERALS) tablet Take 1 tablet by mouth daily.    Marland Kitchen omeprazole (PRILOSEC) 20 MG capsule Take 1 capsule (20 mg total) by mouth 2 (two) times daily. 60 capsule 2  . Spacer/Aero-Holding Chambers (AEROCHAMBER PLUS) inhaler Use as instructed 1 each 2  . traZODone (DESYREL) 100 MG tablet Take 1 tablet (100 mg total) by mouth at bedtime as needed for sleep. 90 tablet 0  . vitamin B-12 (CYANOCOBALAMIN) 1000 MCG tablet Take 1,000 mcg by mouth daily.     No current facility-administered medications for this visit.     Psychiatric Specialty Exam: Review of Systems  Psychiatric/Behavioral: Positive for sleep disturbance.  All other systems reviewed and are negative.   Last menstrual period 11/09/2019.There is no height or weight on file to calculate BMI.  General Appearance: NA  Eye Contact:  NA  Speech:  Clear and Coherent and Normal Rate  Volume:  Normal  Mood:  Depressed  Affect:  NA  Thought Process:  Goal Directed  Orientation:  Full (Time, Place, and Person)  Thought Content: Logical   Suicidal Thoughts:  No  Homicidal Thoughts:  No  Memory:  Immediate;   Good Recent;   Good Remote;   Good  Judgement:  Good  Insight:  Good  Psychomotor Activity:  NA  Concentration:  Concentration: Good  Recall:  Good  Fund of Knowledge: Good  Language: Good  Akathisia:  Negative  Handed:  Right  AIMS (if indicated): not done  Assets:  Communication Skills Desire for Improvement Financial Resources/Insurance Housing Resilience Talents/Skills  ADL's:  Intact  Cognition: WNL   Sleep:  Fair   Screenings: GAD-7     Office Visit from 11/12/2019 in [facility] Office Visit from 11/14/2018 in [facility] Office Visit from 10/09/2018 in [facility]  Total GAD-7 Score 12 14 15     PHQ2-9     Office Visit from 11/12/2019 in [facility] Office Visit from 10[facility]13/2019 in [facility] Visit from 07/08/2019 in [facility] Office Visit from 12[facility]17/2018 in [facility] Office Visit from 12/08/2018 in [facility]  PHQ-2 Total Score 3 0 0 5 2  PHQ-9 Total Score 13 4 0 17 18       Assessment and Plan: 34 yo separated white female with long hx of treatment for bipolar disorder. She would have 5-6 of hypomanic episodes at the most per year. They are followed by depressive episodes which are much longer (one month or so) and were often severe enough that she felt suicidal and needed to be hospitalized. She has been in hospital 6 times: 5 as a teenager last one in her 6s. She denies having hx of suicidal attempts or psychosis. She has been on a combination of fluoxetine and divalproex on and off for many years - it seems to work relatively well. She has been followed by Dr. 38s at [facility] earlier but she cannot see [facility] providers now that she has a [facility]. She had a break in treatment and was restarted on both medications in May 2020. Valproic acid level on 250 mg was 33 mcg/ml and dose was then doubled. In the past she was on 500 mg extended release at bedtime - it was helping with middle insomniaso we changed it to this dose/form. Her sleep did not improve much until we added trazodone. Earlier she tried Seroquel (excessively sedated) and zolpidem (did not help and actually appeared to be activating) in the past.Arwen has changed her works schedule - now works 3rd shift. She had hysterectomy on 11/27/19 and noticed that her  mood declined since - more depressed. We have increased fluoxetine to 40 mg and mood has improved some but is not fully in remission.  Dx: Bipolar2disorder rapid cycling, depressed  PlanContinuefluoxetine 40 mgdaily,Depakote ER 500 mg at [facility] for insomnia - shetypically takes50 mg.I will add aripiprazole 5 mg for further mood stabilization. Next appointment in7 weeks.The plan was discussed with patient who had an opportunity to ask questions and these were all answered. I spend26minutes inphone consultationwith the patient.    12m, MD 01/26/2020, 3:10 PM

## 2020-01-27 ENCOUNTER — Telehealth (HOSPITAL_COMMUNITY): Payer: Self-pay | Admitting: Psychiatry

## 2020-01-27 NOTE — Telephone Encounter (Signed)
01/27/20 12:21pm Called patient due to current insurance has elapsed - per patient she stated that she lost her job and no longer has insurance - gave patient 3-options - Paediatric nurse assistance, self-pay and become a pt of [facility] - gave pt location information. Danielle KitchenMarguerite Olea

## 2020-01-28 ENCOUNTER — Other Ambulatory Visit (HOSPITAL_COMMUNITY): Payer: Self-pay | Admitting: *Deleted

## 2020-01-28 DIAGNOSIS — F3175 Bipolar disorder, in partial remission, most recent episode depressed: Secondary | ICD-10-CM

## 2020-01-28 MED ORDER — FLUOXETINE HCL 40 MG PO CAPS: 40.0000 mg | ORAL_CAPSULE | Freq: Every day | ORAL | 0 refills | Status: DC

## 2020-01-28 MED ORDER — ARIPIPRAZOLE 5 MG PO TABS: 5.0000 mg | ORAL_TABLET | Freq: Every day | ORAL | 1 refills | Status: DC

## 2020-02-16 ENCOUNTER — Ambulatory Visit: Payer: Commercial Managed Care - PPO | Admitting: Family Medicine

## 2020-02-17 ENCOUNTER — Ambulatory Visit: Payer: Commercial Managed Care - PPO | Admitting: Pulmonary Disease

## 2020-02-19 ENCOUNTER — Other Ambulatory Visit: Payer: Self-pay

## 2020-02-19 ENCOUNTER — Other Ambulatory Visit: Payer: Self-pay | Admitting: Sleep Medicine

## 2020-02-19 DIAGNOSIS — I471 Supraventricular tachycardia: Secondary | ICD-10-CM

## 2020-02-21 LAB — SARS-COV-2, NAA 2 DAY TAT

## 2020-02-21 LAB — NOVEL CORONAVIRUS, NAA: SARS-CoV-2, NAA: NOT DETECTED

## 2020-03-14 ENCOUNTER — Telehealth (HOSPITAL_COMMUNITY): Payer: Self-pay | Admitting: Psychiatry

## 2020-03-14 ENCOUNTER — Other Ambulatory Visit (HOSPITAL_COMMUNITY): Payer: Self-pay | Admitting: Psychiatry

## 2020-03-14 ENCOUNTER — Other Ambulatory Visit: Payer: Self-pay

## 2020-03-14 MED ORDER — TRAZODONE HCL 100 MG PO TABS
100.0000 mg | ORAL_TABLET | Freq: Every evening | ORAL | 0 refills | Status: DC | PRN
Start: 2020-03-14 — End: 2020-08-03

## 2020-03-14 MED ORDER — DIVALPROEX SODIUM ER 500 MG PO TB24: 500.0000 mg | ORAL_TABLET | Freq: Every day | ORAL | 0 refills | Status: DC

## 2020-03-15 ENCOUNTER — Encounter: Payer: Self-pay | Admitting: Emergency Medicine

## 2020-03-15 ENCOUNTER — Emergency Department: Payer: Self-pay

## 2020-03-15 ENCOUNTER — Other Ambulatory Visit: Payer: Self-pay

## 2020-03-15 ENCOUNTER — Emergency Department
Admission: EM | Admit: 2020-03-15 | Discharge: 2020-03-15 | Disposition: A | Discharge status: Home / Self Care | Payer: Self-pay | Attending: Emergency Medicine | Admitting: Emergency Medicine

## 2020-03-15 DIAGNOSIS — K529 Noninfective gastroenteritis and colitis, unspecified: Secondary | ICD-10-CM | POA: Insufficient documentation

## 2020-03-15 DIAGNOSIS — K219 Gastro-esophageal reflux disease without esophagitis: Secondary | ICD-10-CM | POA: Insufficient documentation

## 2020-03-15 DIAGNOSIS — J45909 Unspecified asthma, uncomplicated: Secondary | ICD-10-CM | POA: Insufficient documentation

## 2020-03-15 DIAGNOSIS — Z87891 Personal history of nicotine dependence: Secondary | ICD-10-CM | POA: Insufficient documentation

## 2020-03-15 DIAGNOSIS — Z79899 Other long term (current) drug therapy: Secondary | ICD-10-CM | POA: Insufficient documentation

## 2020-03-15 LAB — COMPREHENSIVE METABOLIC PANEL
ALT: 17 U/L (ref 0–44)
AST: 21 U/L (ref 15–41)
Albumin: 4 g/dL (ref 3.5–5.0)
Alkaline Phosphatase: 61 U/L (ref 38–126)
Anion gap: 13 (ref 5–15)
BUN: 9 mg/dL (ref 6–20)
CO2: 23 mmol/L (ref 22–32)
Calcium: 9.1 mg/dL (ref 8.9–10.3)
Chloride: 101 mmol/L (ref 98–111)
Creatinine, Ser: 0.74 mg/dL (ref 0.44–1.00)
GFR calc Af Amer: >60 mL/min (ref >60)
GFR calc non Af Amer: >60 mL/min (ref >60)
Glucose, Bld: 113 mg/dL — ABNORMAL HIGH (ref 70–99)
Potassium: 3.9 mmol/L (ref 3.5–5.1)
Sodium: 137 mmol/L (ref 135–145)
Total Bilirubin: 0.7 mg/dL (ref 0.3–1.2)
Total Protein: 7.8 g/dL (ref 6.5–8.1)

## 2020-03-15 LAB — URINALYSIS, COMPLETE (UACMP) WITH MICROSCOPIC
Bilirubin Urine: NEGATIVE
Glucose, UA: NEGATIVE mg/dL
Ketones, ur: NEGATIVE mg/dL
Nitrite: NEGATIVE
Protein, ur: 100 mg/dL — AB
Specific Gravity, Urine: 1.03 (ref 1.005–1.030)
WBC, UA: >50 WBC/hpf — ABNORMAL HIGH (ref 0–5)
pH: 5 (ref 5.0–8.0)

## 2020-03-15 LAB — CBC
HCT: 37.5 % (ref 36.0–46.0)
Hemoglobin: 13.2 g/dL (ref 12.0–15.0)
MCH: 30.1 pg (ref 26.0–34.0)
MCHC: 35.2 g/dL (ref 30.0–36.0)
MCV: 85.4 fL (ref 80.0–100.0)
Platelets: 293 10*3/uL (ref 150–400)
RBC: 4.39 MIL/uL (ref 3.87–5.11)
RDW: 13.8 % (ref 11.5–15.5)
WBC: 15.7 10*3/uL — ABNORMAL HIGH (ref 4.0–10.5)
nRBC: 0 % (ref 0.0–0.2)

## 2020-03-15 LAB — WET PREP, GENITAL
Sperm: NONE SEEN
Trich, Wet Prep: NONE SEEN
Yeast Wet Prep HPF POC: NONE SEEN

## 2020-03-15 LAB — CHLAMYDIA/NGC RT PCR (ARMC ONLY)
Chlamydia Tr: NOT DETECTED
N gonorrhoeae: NOT DETECTED

## 2020-03-15 LAB — LIPASE, BLOOD: Lipase: 20 U/L (ref 11–51)

## 2020-03-15 MED ORDER — METRONIDAZOLE 500 MG PO TABS: 500.0000 mg | ORAL_TABLET | Freq: Two times a day (BID) | ORAL | 0 refills | Status: DC

## 2020-03-15 MED ORDER — SODIUM CHLORIDE 0.9 % IV BOLUS
1000.0000 mL | Freq: Once | INTRAVENOUS | Status: AC
  Administered 2020-03-15: 1000 mL via INTRAVENOUS

## 2020-03-15 MED ORDER — IOHEXOL 300 MG/ML  SOLN
100.0000 mL | Freq: Once | INTRAMUSCULAR | Status: AC | PRN
  Administered 2020-03-15: 100 mL via INTRAVENOUS
  Filled 2020-03-15: qty 100

## 2020-03-15 MED ORDER — METRONIDAZOLE 500 MG PO TABS
500.0000 mg | ORAL_TABLET | Freq: Once | ORAL | Status: AC
  Administered 2020-03-15: 500 mg via ORAL
  Filled 2020-03-15: qty 1

## 2020-03-15 MED ORDER — ONDANSETRON HCL 4 MG/2ML IJ SOLN
4.0000 mg | Freq: Once | INTRAMUSCULAR | Status: AC
  Administered 2020-03-15: 4 mg via INTRAVENOUS
  Filled 2020-03-15: qty 2

## 2020-03-15 MED ORDER — CIPROFLOXACIN HCL 500 MG PO TABS
500.0000 mg | ORAL_TABLET | Freq: Once | ORAL | Status: AC
  Administered 2020-03-15: 500 mg via ORAL
  Filled 2020-03-15: qty 1

## 2020-03-15 MED ORDER — CIPROFLOXACIN HCL 500 MG PO TABS: 500.0000 mg | ORAL_TABLET | Freq: Two times a day (BID) | ORAL | 0 refills | Status: AC

## 2020-03-15 MED ORDER — MORPHINE SULFATE (PF) 4 MG/ML IV SOLN
4.0000 mg | Freq: Once | INTRAVENOUS | Status: AC
  Administered 2020-03-15: 4 mg via INTRAVENOUS
  Filled 2020-03-15: qty 1

## 2020-03-15 NOTE — ED Provider Notes (Signed)
[facility] Emergency Department Provider Note  ____________________________________________  Time seen: Approximately 6:14 PM  I have reviewed the triage vital signs and the nursing notes.   HISTORY  Chief Complaint Abdominal Pain    HPI [name] is a 34 y.o. female presents emergency department complaining of sharp bilateral abdominal pain.  Patient states that she had sex last night, pain began after sex.  She has had a hysterectomy, had a little bit of spotting and thought that her pain was secondary to sex.  Patient states that the pain has persisted, no additional spotting.  No vaginal discharge.  No concern for STD.  Patient has had no dysuria, polyuria, hematuria.  She has had watery diarrhea since last night.  No blood in the diarrhea.  No emesis.  Pain is sharp, located in bilateral lower quadrants.         Past Medical History:  Diagnosis Date  . Allergy   . Anxiety   . Asthma 11/24/2019   blood allergy testing done 11/24/19 to see what causes it to flare up  . Bipolar 1 disorder (HCC)   . Bipolar disorder (HCC)   . Chronic cough    being worked up with Dr. Jayme Cloud  . Depression   . Dyspareunia in female 11/2019  . Dysrhythmia    TACHY BUT NOT A PROBLEM  . Ectopic pregnancy    had surgery  . GERD (gastroesophageal reflux disease)   . History of concussion 2016  . Hyperlipidemia   . Migraine    has an aura. happening frequently  . MRSA (methicillin resistant Staphylococcus aureus)    was on chin.  many years ago  . NSVD (normal spontaneous vaginal delivery)    x 2  . Poor dentition   . Pseudoseizures    related to anxiety  . Scoliosis   . Scoliosis     Patient Active Problem List   Diagnosis Date Noted  . S/P laparoscopic hysterectomy 12/25/2019  . Pelvic pain 11/27/2019  . Dyspareunia due to medical condition in female 11/27/2019  . Dysmenorrhea 11/27/2019  . Radial styloid tenosynovitis 10/13/2019  . Bipolar  affective disorder (HCC) 01/26/2015  . Seizures (HCC) 01/12/2015  . Asthma   . Hyperlipidemia   . Bipolar 2 disorder (HCC)   . Allergy   . Migraine headache with aura     Past Surgical History:  Procedure Laterality Date  . CARPAL TUNNEL RELEASE  2015   left  . CYSTOSCOPY  11/27/2019   Procedure: CYSTOSCOPY;  Surgeon: Nadara Mustard, MD;  Location: [facility] ORS;  Service: Gynecology;;  . ECTOPIC PREGNANCY SURGERY  2011  . HERNIA REPAIR  2010  . TOTAL LAPAROSCOPIC HYSTERECTOMY WITH SALPINGECTOMY Bilateral 11/27/2019   Procedure: TOTAL LAPAROSCOPIC HYSTERECTOMY WITH SALPINGECTOMY;  Surgeon: Nadara Mustard, MD;  Location: [facility] ORS;  Service: Gynecology;  Laterality: Bilateral;  needs RNFA  . TUBAL LIGATION      Prior to Admission medications   Medication Sig Start Date End Date Taking? Authorizing Provider  albuterol (VENTOLIN HFA) 108 (90 Base) MCG/ACT inhaler Inhale 2 puffs into the lungs every 4 (four) hours as needed for wheezing or shortness of breath. 07/08/19   Domenick Gong, MD  ARIPiprazole (ABILIFY) 5 MG tablet Take 1 tablet (5 mg total) by mouth daily. 01/28/20 03/28/20  Pucilowski, Roosvelt Maser, MD  benzonatate (TESSALON) 200 MG capsule Take 1 capsule (200 mg total) by mouth 3 (three) times daily as needed for cough. 10/13/19   Sowles,  Danna Hefty, MD  ciprofloxacin (CIPRO) 500 MG tablet Take 1 tablet (500 mg total) by mouth 2 (two) times daily for 7 days. 03/15/20 03/22/20  Liisa Picone, Delorise Royals, PA-C  divalproex (DEPAKOTE ER) 500 MG 24 hr tablet Take 1 tablet (500 mg total) by mouth at bedtime. 03/14/20 06/12/20  Pucilowski, Roosvelt Maser, MD  FLUoxetine (PROZAC) 40 MG capsule Take 1 capsule (40 mg total) by mouth daily. 01/28/20 04/27/20  Pucilowski, Roosvelt Maser, MD  fluticasone (FLONASE) 50 MCG/ACT nasal spray Place 1 spray into both nostrils 2 (two) times daily. 09/07/18   Eaven Schwager, Delorise Royals, PA-C  Fluticasone-Umeclidin-Vilant (TRELEGY ELLIPTA) 200-62.5-25 MCG/INH AEPB Inhale 1 puff into the  lungs daily. 11/24/19   Salena Saner, MD  ibuprofen (ADVIL) 600 MG tablet Take 1 tablet (600 mg total) by mouth every 6 (six) hours as needed. 07/08/19   Domenick Gong, MD  metroNIDAZOLE (FLAGYL) 500 MG tablet Take 1 tablet (500 mg total) by mouth 2 (two) times daily. 03/15/20   Akua Blethen, Delorise Royals, PA-C  Multiple Vitamins-Minerals (MULTIVITAMIN WITH MINERALS) tablet Take 1 tablet by mouth daily.    [provider]  omeprazole (PRILOSEC) 20 MG capsule Take 1 capsule (20 mg total) by mouth 2 (two) times daily. 11/24/19 02/22/20  Salena Saner, MD  Spacer/Aero-Holding Chambers (AEROCHAMBER PLUS) inhaler Use as instructed 07/08/19   Domenick Gong, MD  traZODone (DESYREL) 100 MG tablet Take 1 tablet (100 mg total) by mouth at bedtime as needed for sleep. 03/14/20 06/12/20  Pucilowski, Roosvelt Maser, MD  vitamin B-12 (CYANOCOBALAMIN) 1000 MCG tablet Take 1,000 mcg by mouth daily.    [provider]    Allergies Robitussin (alcohol free) [guaifenesin], Strawberry extract, Tramadol, Aspirin, Chocolate flavor, and Codeine  Family History  Problem Relation Age of Onset  . Asthma Mother   . Heart disease Mother   . Diabetes Mother   . Hyperlipidemia Mother   . Hypertension Mother   . Mental illness Mother   . Migraines Mother   . Thyroid disease Mother   . Stroke Mother   . Alcohol abuse Mother   . Bipolar disorder Mother   . Anxiety disorder Mother   . COPD Mother   . Hypertension Sister   . Depression Sister   . Drug abuse Sister   . Cancer Maternal Grandmother   . Diabetes Maternal Grandmother   . Heart disease Maternal Grandmother   . Hyperlipidemia Maternal Grandmother   . Hypertension Maternal Grandmother   . Kidney disease Maternal Grandmother   . Heart disease Paternal Grandfather   . Alcohol abuse Paternal Grandfather   . Drug abuse Paternal Grandfather     Social History Social History   Tobacco Use  . Smoking status: Former Smoker     Packs/day: 1.00    Years: 15.00    Pack years: 15.00    Types: Cigarettes    Start date: 09/07/2019    Quit date: 08/2019    Years since quitting: 0.5  . Smokeless tobacco: Never Used  . Tobacco comment: did not want to quit but can't inhale w/ coughing  Vaping Use  . Vaping Use: Never used  Substance Use Topics  . Alcohol use: No    Alcohol/week: 0.0 standard drinks  . Drug use: No     Review of Systems  Constitutional: No fever/chills Eyes: No visual changes. No discharge ENT: No upper respiratory complaints. Cardiovascular: no chest pain. Respiratory: no cough. No SOB. Gastrointestinal: No abdominal pain.  No nausea, no vomiting.  Positive for watery diarrhea.  No constipation. Genitourinary: Negative for dysuria. No hematuria.  No vaginal discharge.  Some spotting after sex last night.  None currently. Musculoskeletal: Negative for musculoskeletal pain. Skin: Negative for rash, abrasions, lacerations, ecchymosis. Neurological: Negative for headaches, focal weakness or numbness. 10-point ROS otherwise negative.  ____________________________________________   PHYSICAL EXAM:  VITAL SIGNS: ED Triage Vitals  Enc Vitals Group     BP 03/15/20 1629 116/75     Pulse Rate 03/15/20 1629 (!) 113     Resp 03/15/20 1629 18     Temp 03/15/20 1629 98.3 F (36.8 C)     Temp Source 03/15/20 1629 Oral     SpO2 03/15/20 1629 97 %     Weight 03/15/20 1630 175 lb (79.4 kg)     Height 03/15/20 1630 5\' 5"  (1.651 m)     Head Circumference --      Peak Flow --      Pain Score 03/15/20 1629 8     Pain Loc --      Pain Edu? --      Excl. in GC? --      Constitutional: Alert and oriented. Well appearing and in no acute distress. Eyes: Conjunctivae are normal. PERRL. EOMI. Head: Atraumatic. ENT:      Ears:       Nose: No congestion/rhinnorhea.      Mouth/Throat: Mucous membranes are moist.  Neck: No stridor.   Hematological/Lymphatic/Immunilogical: No cervical  lymphadenopathy. Cardiovascular: Normal rate, regular rhythm. Normal S1 and S2.  Good peripheral circulation. Respiratory: Normal respiratory effort without tachypnea or retractions. Lungs CTAB. Good air entry to the bases with no decreased or absent breath sounds. Gastrointestinal: Bowel sounds 4 quadrants.  Soft to palpation all quadrants.  Patient is tender to palpation with guarding to bilateral lower quadrants.  No  rigidity. No palpable masses. No distention. No CVA tenderness. Musculoskeletal: Full range of motion to all extremities. No gross deformities appreciated. Neurologic:  Normal speech and language. No gross focal neurologic deficits are appreciated.  Skin:  Skin is warm, dry and intact. No rash noted. Psychiatric: Mood and affect are normal. Speech and behavior are normal. Patient exhibits appropriate insight and judgement.   ____________________________________________   LABS (all labs ordered are listed, but only abnormal results are displayed)  Labs Reviewed  COMPREHENSIVE METABOLIC PANEL - Abnormal; Notable for the following components:      Result Value   Glucose, Bld 113 (*)    All other components within normal limits  CBC - Abnormal; Notable for the following components:   WBC 15.7 (*)    All other components within normal limits  URINALYSIS, COMPLETE (UACMP) WITH MICROSCOPIC - Abnormal; Notable for the following components:   Color, Urine AMBER (*)    APPearance CLOUDY (*)    Hgb urine dipstick MODERATE (*)    Protein, ur 100 (*)    Leukocytes,Ua SMALL (*)    WBC, UA >50 (*)    Bacteria, UA RARE (*)    All other components within normal limits  CHLAMYDIA/NGC RT PCR ([facility] ONLY)  WET PREP, GENITAL  LIPASE, BLOOD   ____________________________________________  EKG   ____________________________________________  RADIOLOGY I personally viewed and evaluated these images as part of my medical decision making, as well as reviewing the written  report by the radiologist.  CT ABDOMEN PELVIS W CONTRAST  Result Date: 03/15/2020 CLINICAL DATA:  Diarrhea.  Lower abdominal pain. EXAM: CT ABDOMEN AND PELVIS WITH CONTRAST TECHNIQUE: Multidetector CT  imaging of the abdomen and pelvis was performed using the standard protocol following bolus administration of intravenous contrast. CONTRAST:  OMNIPAQUE IOHEXOL 300 MG/ML  SOLN COMPARISON:  CT dated Nov 02, 2019 FINDINGS: Lower chest: The lung bases are clear. The heart size is normal. Hepatobiliary: The liver is normal. Normal gallbladder.There is no biliary ductal dilation. Pancreas: Normal contours without ductal dilatation. No peripancreatic fluid collection. Spleen: Unremarkable. Adrenals/Urinary Tract: --Adrenal glands: Unremarkable. --Right kidney/ureter: No hydronephrosis or radiopaque kidney stones. --Left kidney/ureter: No hydronephrosis or radiopaque kidney stones. --Urinary bladder: Unremarkable. Stomach/Bowel: --Stomach/Duodenum: No hiatal hernia or other gastric abnormality. Normal duodenal course and caliber. --Small bowel: There is no evidence for small bowel obstruction. There is some mild wall thickening and hyperenhancement several small bowel loops located in the patient's pelvis. --Colon: There are air-fluid levels the patient's colon. There is no definite CT evidence for colitis or diverticulitis. --Appendix: Normal. Vascular/Lymphatic: Normal course and caliber of the major abdominal vessels. --No retroperitoneal lymphadenopathy. --No mesenteric lymphadenopathy. --there are few mildly prominent pelvic lymph nodes that are presumably reactive. Reproductive: The patient is status post prior hysterectomy. There are inflammatory changes in the patient's pelvis with infiltration of the pelvic fat. Other: No ascites or free air. The abdominal wall is normal. Musculoskeletal. No acute displaced fractures. IMPRESSION: 1. There are inflammatory changes in the patient's pelvis with infiltration  of the pelvic fat. Findings are nonspecific but could be seen in the setting of pelvic inflammatory disease. Alternatively, this may be secondary to enteritis given the presence of a few hyperenhancing loops of bowel in the patient's pelvis that demonstrate mild associated wall thickening. 2. There are air-fluid levels the patient's colon, which can be seen in patients with diarrhea. There is no definite CT evidence for colitis or diverticulitis. 3. Normal appendix located in the patient's pelvis. 4. Status post hysterectomy. Electronically Signed   By: Katherine Mantle M.D.   On: 03/15/2020 19:33    ____________________________________________    PROCEDURES  Procedure(s) performed:    Procedures    Medications  metroNIDAZOLE (FLAGYL) tablet 500 mg (has no administration in time range)  ciprofloxacin (CIPRO) tablet 500 mg (has no administration in time range)  sodium chloride 0.9 % bolus 1,000 mL (1,000 mLs Intravenous New Bag/Given 03/15/20 1858)  ondansetron (ZOFRAN) injection 4 mg (4 mg Intravenous Given 03/15/20 1900)  morphine 4 MG/ML injection 4 mg (4 mg Intravenous Given 03/15/20 1901)  iohexol (OMNIPAQUE) 300 MG/ML solution 100 mL (100 mLs Intravenous Contrast Given 03/15/20 1914)     ____________________________________________   INITIAL IMPRESSION / ASSESSMENT AND PLAN / ED COURSE  Pertinent labs & imaging results that were available during my care of the patient were reviewed by me and considered in my medical decision making (see chart for details).  Review of the [place] CSRS was performed in accordance of the [facility] prior to dispensing any controlled drugs.  Clinical Course as of Mar 16 2015  Tue Mar 15, 2020  2011 Patient presented to emergency department with diffuse abdominal pain, primarily in the lower quadrants. Patient states that pain began after sexual intercourse with a partner. Patient was having diffuse watery diarrhea starting with pain as well. No fevers or  chills. No vaginal discharge. No concern for STD. Patient was diffusely tender to palpation. Labs revealed elevated white blood cell count. Patient has had a total hysterectomy. At this time, patient will have CT scan for further evaluation. CT revealed possible enteritis versus PID. I feel that symptoms are most  consistent with enteritis and I will start patient on Cipro and Flagyl for same. I did discuss the patient with attending provider, Dr. Fanny BienQuale. We felt that even with the significantly low risk of PID, it would be beneficial to have patient self swab for BV, gonorrhea and chlamydia. Patient will have swabs, and I will discharge her with antibiotics for enteritis. If patient has positive swabs that makes diagnosis leading worse to PID, we will call the patient and adjust antibiotics accordingly.   [JC]    Clinical Course User Index [JC] Maecy Podgurski, Delorise RoyalsJonathan D, PA-C          Patient's diagnosis is consistent with enteritis. Patient presented to the emergency department complaining of diffuse abdominal pain. Pain began after sexual intercourse. Patient had elevated white blood cell count, watery diarrhea. CT revealed enteritis versus PID. Patient will self swab for BV/gonorrhea/chlamydia but at this time I will treat the patient for enteritis. If swab reveals findings such as STD/BV that would make diagnosis lean more towards PID, we will contact the patient and change antibiotics.. Patient will be discharged home with prescriptions for Flagyl and Cipro. Patient is to follow up with primary care or OB/GYN as needed or otherwise directed. Patient is given ED precautions to return to the ED for any worsening or new symptoms.     ____________________________________________  FINAL CLINICAL IMPRESSION(S) / ED DIAGNOSES  Final diagnoses:  Enteritis      NEW MEDICATIONS STARTED DURING THIS VISIT:  ED Discharge Orders         Ordered    ciprofloxacin (CIPRO) 500 MG tablet  2 times daily         03/15/20 2015    metroNIDAZOLE (FLAGYL) 500 MG tablet  2 times daily        03/15/20 2015              This chart was dictated using voice recognition software/Dragon. Despite best efforts to proofread, errors can occur which can change the meaning. Any change was purely unintentional.    Racheal PatchesCuthriell, Marivel Mcclarty D, PA-C 03/15/20 2016    Sharyn CreamerQuale, Mark, MD 03/15/20 [phone]

## 2020-03-15 NOTE — ED Triage Notes (Signed)
Pt arrived via POV with c/o lower abdominal pain that started last night. Pt states she had sex last night and has pain since then.   Pt states she had BM today that was pure water.  Pt denies any vomiting. Pt denies any dysuria.  Pt states sxs are similar to after her hysterectomy which was November 27 2019.

## 2020-04-23 ENCOUNTER — Emergency Department: Payer: HRSA Program

## 2020-04-23 ENCOUNTER — Encounter: Payer: Self-pay | Admitting: Emergency Medicine

## 2020-04-23 ENCOUNTER — Emergency Department
Admission: EM | Admit: 2020-04-23 | Discharge: 2020-04-23 | Disposition: A | Discharge status: Home / Self Care | Payer: HRSA Program | Attending: Emergency Medicine | Admitting: Emergency Medicine

## 2020-04-23 ENCOUNTER — Other Ambulatory Visit: Payer: Self-pay

## 2020-04-23 DIAGNOSIS — R059 Cough, unspecified: Secondary | ICD-10-CM | POA: Diagnosis present

## 2020-04-23 DIAGNOSIS — Z87891 Personal history of nicotine dependence: Secondary | ICD-10-CM | POA: Diagnosis not present

## 2020-04-23 DIAGNOSIS — R079 Chest pain, unspecified: Secondary | ICD-10-CM | POA: Insufficient documentation

## 2020-04-23 DIAGNOSIS — Z20822 Contact with and (suspected) exposure to covid-19: Secondary | ICD-10-CM | POA: Insufficient documentation

## 2020-04-23 DIAGNOSIS — J069 Acute upper respiratory infection, unspecified: Secondary | ICD-10-CM | POA: Insufficient documentation

## 2020-04-23 DIAGNOSIS — J4521 Mild intermittent asthma with (acute) exacerbation: Secondary | ICD-10-CM | POA: Insufficient documentation

## 2020-04-23 DIAGNOSIS — Z7951 Long term (current) use of inhaled steroids: Secondary | ICD-10-CM | POA: Insufficient documentation

## 2020-04-23 DIAGNOSIS — J209 Acute bronchitis, unspecified: Secondary | ICD-10-CM | POA: Insufficient documentation

## 2020-04-23 LAB — COMPREHENSIVE METABOLIC PANEL
ALT: 26 U/L (ref 0–44)
AST: 24 U/L (ref 15–41)
Albumin: 3.8 g/dL (ref 3.5–5.0)
Alkaline Phosphatase: 67 U/L (ref 38–126)
Anion gap: 10 (ref 5–15)
BUN: 13 mg/dL (ref 6–20)
CO2: 23 mmol/L (ref 22–32)
Calcium: 9 mg/dL (ref 8.9–10.3)
Chloride: 104 mmol/L (ref 98–111)
Creatinine, Ser: 0.58 mg/dL (ref 0.44–1.00)
GFR, Estimated: >60 mL/min (ref >60)
Glucose, Bld: 110 mg/dL — ABNORMAL HIGH (ref 70–99)
Potassium: 3.7 mmol/L (ref 3.5–5.1)
Sodium: 137 mmol/L (ref 135–145)
Total Bilirubin: 0.5 mg/dL (ref 0.3–1.2)
Total Protein: 7 g/dL (ref 6.5–8.1)

## 2020-04-23 LAB — CBC WITH DIFFERENTIAL/PLATELET
Abs Immature Granulocytes: 0.03 10*3/uL (ref 0.00–0.07)
Basophils Absolute: 0 10*3/uL (ref 0.0–0.1)
Basophils Relative: 0 %
Eosinophils Absolute: 0.3 10*3/uL (ref 0.0–0.5)
Eosinophils Relative: 2 %
HCT: 35.5 % — ABNORMAL LOW (ref 36.0–46.0)
Hemoglobin: 12.2 g/dL (ref 12.0–15.0)
Immature Granulocytes: 0 %
Lymphocytes Relative: 28 %
Lymphs Abs: 3.5 10*3/uL (ref 0.7–4.0)
MCH: 29.8 pg (ref 26.0–34.0)
MCHC: 34.4 g/dL (ref 30.0–36.0)
MCV: 86.6 fL (ref 80.0–100.0)
Monocytes Absolute: 0.9 10*3/uL (ref 0.1–1.0)
Monocytes Relative: 7 %
Neutro Abs: 8 10*3/uL — ABNORMAL HIGH (ref 1.7–7.7)
Neutrophils Relative %: 63 %
Platelets: 313 10*3/uL (ref 150–400)
RBC: 4.1 MIL/uL (ref 3.87–5.11)
RDW: 13.4 % (ref 11.5–15.5)
WBC: 12.8 10*3/uL — ABNORMAL HIGH (ref 4.0–10.5)
nRBC: 0 % (ref 0.0–0.2)

## 2020-04-23 LAB — FIBRIN DERIVATIVES D-DIMER (ARMC ONLY): Fibrin derivatives D-dimer (ARMC): 671.04 ng/mL (FEU) — ABNORMAL HIGH (ref 0.00–499.00)

## 2020-04-23 LAB — TROPONIN I (HIGH SENSITIVITY): Troponin I (High Sensitivity): <2 ng/L (ref <18)

## 2020-04-23 LAB — RESPIRATORY PANEL BY RT PCR (FLU A&B, COVID)
Influenza A by PCR: NEGATIVE
Influenza B by PCR: NEGATIVE
SARS Coronavirus 2 by RT PCR: NEGATIVE

## 2020-04-23 MED ORDER — PREDNISONE 10 MG PO TABS: ORAL_TABLET | ORAL | 0 refills | Status: AC

## 2020-04-23 MED ORDER — IPRATROPIUM-ALBUTEROL 0.5-2.5 (3) MG/3ML IN SOLN
3.0000 mL | Freq: Once | RESPIRATORY_TRACT | Status: AC
  Administered 2020-04-23: 3 mL via RESPIRATORY_TRACT
  Filled 2020-04-23: qty 3

## 2020-04-23 MED ORDER — IOHEXOL 350 MG/ML SOLN
75.0000 mL | Freq: Once | INTRAVENOUS | Status: AC | PRN
  Administered 2020-04-23: 75 mL via INTRAVENOUS
  Filled 2020-04-23: qty 75

## 2020-04-23 MED ORDER — DEXAMETHASONE SODIUM PHOSPHATE 10 MG/ML IJ SOLN
10.0000 mg | Freq: Once | INTRAMUSCULAR | Status: AC
  Administered 2020-04-23: 10 mg via INTRAVENOUS
  Filled 2020-04-23: qty 1

## 2020-04-23 MED ORDER — SODIUM CHLORIDE 0.9 % IV SOLN
500.0000 mg | Freq: Once | INTRAVENOUS | Status: AC
  Administered 2020-04-23: 500 mg via INTRAVENOUS
  Filled 2020-04-23: qty 500

## 2020-04-23 MED ORDER — AZITHROMYCIN 250 MG PO TABS: ORAL_TABLET | ORAL | 0 refills | Status: AC

## 2020-04-23 NOTE — ED Triage Notes (Signed)
Pt via pov from home with cough and congestion x 1 week. Pt has not been tested for covid. Pt alert & oriented, nad noted.

## 2020-04-23 NOTE — ED Provider Notes (Signed)
[facility] Emergency Department Provider Note  ___________________________________________   First MD Initiated Contact with Patient 04/23/20 1811     (approximate)  I have reviewed the triage vital signs and the nursing notes.   HISTORY  Chief Complaint Cough and Nasal Congestion  HPI [name] is a 34 y.o. female who presents to the emergency department for evaluation of cough and congestion for the last week.  She states that 9 days ago, her child came home from school with runny nose and cough.  The child was tested for Covid and was negative.  The entire family then got sick, and she began having symptoms 7 days ago.  Her family member seem to recover from the illness faster than she did, causing her to present today.  She does have a history of asthma and has been trying her inhalers without any relief.  She endorses chest pain over the last 2 days that is in the center and constant, does not improve or worsen with her coughing.  She denies shortness of breath, fever, chills.  The patient has been fully vaccinated for Covid.         Past Medical History:  Diagnosis Date  . Allergy   . Anxiety   . Asthma 11/24/2019   blood allergy testing done 11/24/19 to see what causes it to flare up  . Bipolar 1 disorder (HCC)   . Bipolar disorder (HCC)   . Chronic cough    being worked up with Dr. Jayme Cloud  . Depression   . Dyspareunia in female 11/2019  . Dysrhythmia    TACHY BUT NOT A PROBLEM  . Ectopic pregnancy    had surgery  . GERD (gastroesophageal reflux disease)   . History of concussion 2016  . Hyperlipidemia   . Migraine    has an aura. happening frequently  . MRSA (methicillin resistant Staphylococcus aureus)    was on chin.  many years ago  . NSVD (normal spontaneous vaginal delivery)    x 2  . Poor dentition   . Pseudoseizures (HCC)    related to anxiety  . Scoliosis   . Scoliosis     Patient Active Problem List   Diagnosis  Date Noted  . S/P laparoscopic hysterectomy 12/25/2019  . Pelvic pain 11/27/2019  . Dyspareunia due to medical condition in female 11/27/2019  . Dysmenorrhea 11/27/2019  . Radial styloid tenosynovitis 10/13/2019  . Bipolar affective disorder (HCC) 01/26/2015  . Seizures (HCC) 01/12/2015  . Asthma   . Hyperlipidemia   . Bipolar 2 disorder (HCC)   . Allergy   . Migraine headache with aura     Past Surgical History:  Procedure Laterality Date  . CARPAL TUNNEL RELEASE  2015   left  . CYSTOSCOPY  11/27/2019   Procedure: CYSTOSCOPY;  Surgeon: Nadara Mustard, MD;  Location: [facility] ORS;  Service: Gynecology;;  . ECTOPIC PREGNANCY SURGERY  2011  . HERNIA REPAIR  2010  . TOTAL LAPAROSCOPIC HYSTERECTOMY WITH SALPINGECTOMY Bilateral 11/27/2019   Procedure: TOTAL LAPAROSCOPIC HYSTERECTOMY WITH SALPINGECTOMY;  Surgeon: Nadara Mustard, MD;  Location: [facility] ORS;  Service: Gynecology;  Laterality: Bilateral;  needs RNFA  . TUBAL LIGATION      Prior to Admission medications   Medication Sig Start Date End Date Taking? Authorizing Provider  albuterol (VENTOLIN HFA) 108 (90 Base) MCG/ACT inhaler Inhale 2 puffs into the lungs every 4 (four) hours as needed for wheezing or shortness of breath. 07/08/19   Mortenson,  Morrie SheldonAshley, MD  ARIPiprazole (ABILIFY) 5 MG tablet Take 1 tablet (5 mg total) by mouth daily. 01/28/20 03/28/20  Pucilowski, Roosvelt Maserlgierd A, MD  azithromycin (ZITHROMAX Z-PAK) 250 MG tablet Take 2 tablets (500 mg) on  Day 1,  followed by 1 tablet (250 mg) once daily on Days 2 through 5. 04/23/20 04/28/20  Asuncion Shibata, Ruben Gottronaitlin J, PA  benzonatate (TESSALON) 200 MG capsule Take 1 capsule (200 mg total) by mouth 3 (three) times daily as needed for cough. 10/13/19   Alba CorySowles, Krichna, MD  divalproex (DEPAKOTE ER) 500 MG 24 hr tablet Take 1 tablet (500 mg total) by mouth at bedtime. 03/14/20 06/12/20  Pucilowski, Roosvelt Maserlgierd A, MD  FLUoxetine (PROZAC) 40 MG capsule Take 1 capsule (40 mg total) by mouth daily. 01/28/20 04/27/20   Pucilowski, Roosvelt Maserlgierd A, MD  fluticasone (FLONASE) 50 MCG/ACT nasal spray Place 1 spray into both nostrils 2 (two) times daily. 09/07/18   Cuthriell, Delorise RoyalsJonathan D, PA-C  Fluticasone-Umeclidin-Vilant (TRELEGY ELLIPTA) 200-62.5-25 MCG/INH AEPB Inhale 1 puff into the lungs daily. 11/24/19   Salena SanerGonzalez, Carmen L, MD  ibuprofen (ADVIL) 600 MG tablet Take 1 tablet (600 mg total) by mouth every 6 (six) hours as needed. 07/08/19   Domenick GongMortenson, Ashley, MD  metroNIDAZOLE (FLAGYL) 500 MG tablet Take 1 tablet (500 mg total) by mouth 2 (two) times daily. 03/15/20   Cuthriell, Delorise RoyalsJonathan D, PA-C  Multiple Vitamins-Minerals (MULTIVITAMIN WITH MINERALS) tablet Take 1 tablet by mouth daily.    [provider]  omeprazole (PRILOSEC) 20 MG capsule Take 1 capsule (20 mg total) by mouth 2 (two) times daily. 11/24/19 02/22/20  Salena SanerGonzalez, Carmen L, MD  predniSONE (DELTASONE) 10 MG tablet Take 6 tablets (60 mg total) by mouth daily for 1 day, THEN 5 tablets (50 mg total) daily for 1 day, THEN 4 tablets (40 mg total) daily for 1 day, THEN 3 tablets (30 mg total) daily for 1 day, THEN 2 tablets (20 mg total) daily for 1 day, THEN 1 tablet (10 mg total) daily for 1 day. 04/23/20 04/29/20  Lucy Chrisodgers, Euline Kimbler J, PA  Spacer/Aero-Holding Chambers (AEROCHAMBER PLUS) inhaler Use as instructed 07/08/19   Domenick GongMortenson, Ashley, MD  traZODone (DESYREL) 100 MG tablet Take 1 tablet (100 mg total) by mouth at bedtime as needed for sleep. 03/14/20 06/12/20  Pucilowski, Roosvelt Maserlgierd A, MD  vitamin B-12 (CYANOCOBALAMIN) 1000 MCG tablet Take 1,000 mcg by mouth daily.    [provider]    Allergies Robitussin (alcohol free) [guaifenesin], Strawberry extract, Tramadol, Aspirin, Chocolate flavor, and Codeine  Family History  Problem Relation Age of Onset  . Asthma Mother   . Heart disease Mother   . Diabetes Mother   . Hyperlipidemia Mother   . Hypertension Mother   . Mental illness Mother   . Migraines Mother   . Thyroid disease Mother   . Stroke  Mother   . Alcohol abuse Mother   . Bipolar disorder Mother   . Anxiety disorder Mother   . COPD Mother   . Hypertension Sister   . Depression Sister   . Drug abuse Sister   . Cancer Maternal Grandmother   . Diabetes Maternal Grandmother   . Heart disease Maternal Grandmother   . Hyperlipidemia Maternal Grandmother   . Hypertension Maternal Grandmother   . Kidney disease Maternal Grandmother   . Heart disease Paternal Grandfather   . Alcohol abuse Paternal Grandfather   . Drug abuse Paternal Grandfather     Social History Social History   Tobacco Use  . Smoking  status: Former Smoker    Packs/day: 1.00    Years: 15.00    Pack years: 15.00    Types: Cigarettes    Start date: 09/07/2019    Quit date: 08/2019    Years since quitting: 0.6  . Smokeless tobacco: Never Used  . Tobacco comment: did not want to quit but can't inhale w/ coughing  Vaping Use  . Vaping Use: Never used  Substance Use Topics  . Alcohol use: No    Alcohol/week: 0.0 standard drinks  . Drug use: No    Review of Systems Constitutional: No fever/chills Eyes: No visual changes. ENT: + Nasal drainage, no sore throat. Cardiovascular: + chest pain. Respiratory: + Cough, denies shortness of breath. Gastrointestinal: No abdominal pain.  No nausea, no vomiting.  No diarrhea.  No constipation. Genitourinary: Negative for dysuria. Musculoskeletal: Negative for back pain. Skin: Negative for rash. Neurological: Negative for headaches, focal weakness or numbness.   ____________________________________________   PHYSICAL EXAM:  VITAL SIGNS: ED Triage Vitals  Enc Vitals Group     BP 04/23/20 1711 114/71     Pulse Rate 04/23/20 1711 (!) 102     Resp 04/23/20 1711 16     Temp 04/23/20 1711 98.2 F (36.8 C)     Temp Source 04/23/20 1711 Oral     SpO2 04/23/20 1711 97 %     Weight --      Height --      Head Circumference --      Peak Flow --      Pain Score 04/23/20 2252 0     Pain Loc --       Pain Edu? --      Excl. in GC? --     Constitutional: Alert and oriented. Well appearing and in no acute distress. Eyes: Conjunctivae are normal. PERRL. EOMI. Head: Atraumatic. Nose: + congestion/rhinnorhea. Mouth/Throat: Mucous membranes are moist.  Oropharynx erythematous without tonsillar enlargement or exudate. Neck: No stridor.   Cardiovascular: Tachycardic, regular rhythm. Grossly normal heart sounds.  Good peripheral circulation. Respiratory: Normal respiratory effort.  No retractions.  Lung sounds are coarse without any appreciated wheezing, rales or rhonchi. Gastrointestinal: Soft and nontender. No distention. No abdominal bruits. No CVA tenderness. Musculoskeletal: No lower extremity tenderness nor edema.  No joint effusions. Neurologic:  Normal speech and language. No gross focal neurologic deficits are appreciated. No gait instability. Skin:  Skin is warm, dry and intact. No rash noted. Psychiatric: Mood and affect are normal. Speech and behavior are normal.  ____________________________________________   LABS (all labs ordered are listed, but only abnormal results are displayed)  Labs Reviewed  CBC WITH DIFFERENTIAL/PLATELET - Abnormal; Notable for the following components:      Result Value   WBC 12.8 (*)    HCT 35.5 (*)    Neutro Abs 8.0 (*)    All other components within normal limits  COMPREHENSIVE METABOLIC PANEL - Abnormal; Notable for the following components:   Glucose, Bld 110 (*)    All other components within normal limits  FIBRIN DERIVATIVES D-DIMER ([facility] ONLY) - Abnormal; Notable for the following components:   Fibrin derivatives D-dimer ([facility]) 671.04 (*)    All other components within normal limits  RESPIRATORY PANEL BY RT PCR (FLU A&B, COVID)  TROPONIN I (HIGH SENSITIVITY)   ____________________________________________  EKG  EKG reveals normal sinus rhythm with a rate in the upper 90s with normal QT interval.  No ST-T wave changes  identified. ____________________________________________  RADIOLOGY Joana Reamer  Karie Fetch, personally viewed and evaluated these images (plain radiographs) as part of my medical decision making, as well as reviewing the written report by the radiologist.  ED provider interpretation: No pneumonia identified.  Official radiology report(s): DG Chest 2 View  Result Date: 04/23/2020 CLINICAL DATA:  Chest pain EXAM: CHEST - 2 VIEW COMPARISON:  09/08/2019 FINDINGS: The heart size and mediastinal contours are within normal limits. Both lungs are clear. The visualized skeletal structures are unremarkable. IMPRESSION: No active cardiopulmonary disease. Electronically Signed   By: Helyn Numbers MD   On: 04/23/2020 19:28   CT Angio Chest PE W and/or Wo Contrast  Result Date: 04/23/2020 CLINICAL DATA:  Positive D-dimer, cough, congestion EXAM: CT ANGIOGRAPHY CHEST WITH CONTRAST TECHNIQUE: Multidetector CT imaging of the chest was performed using the standard protocol during bolus administration of intravenous contrast. Multiplanar CT image reconstructions and MIPs were obtained to evaluate the vascular anatomy. CONTRAST:  58mL OMNIPAQUE IOHEXOL 350 MG/ML SOLN COMPARISON:  None. FINDINGS: Cardiovascular: No filling defects in the pulmonary arteries to suggest pulmonary emboli. Heart normal size. Aorta normal caliber. Mediastinum/Nodes: Mildly prominent prevascular lymph nodes measuring up to 11 mm. No axillary or hilar adenopathy. Lungs/Pleura: Lungs are clear. No focal airspace opacities or suspicious nodules. No effusions. Upper Abdomen: Imaging into the upper abdomen demonstrates no acute findings. Musculoskeletal: Chest wall soft tissues are unremarkable. No acute bony abnormality. Review of the MIP images confirms the above findings. IMPRESSION: No evidence of pulmonary embolus. Mildly prominent prevascular lymph nodes, likely reactive. No acute cardiopulmonary disease. Electronically Signed   By: Charlett Nose M.D.   On: 04/23/2020 21:21     ____________________________________________   INITIAL IMPRESSION / ASSESSMENT AND PLAN / ED COURSE  As part of my medical decision making, I reviewed the following data within the electronic MEDICAL RECORD NUMBER Nursing notes reviewed and incorporated, EKG interpreted and Radiograph reviewed        Patient is a 34 year old female presents emergency department for 1 week of nasal congestion, cough with 2 days of chest pain.  See HPI for further details.  Physical exam reveals tachycardia, nasal drainage, erythematous oropharynx and coarse breath sounds but is otherwise unremarkable.  Evaluation in the emergency department includes negative Covid or influenza, CMP within normal limits, CBC with mildly elevated white blood cells otherwise normal.  Troponin was also obtained due to reported chest pain and is less than 2 so no second troponin was ordered.  Given the patient's tachycardia in triage as well as her presentation with cough and chest pain, a D-dimer was obtained to attempt to exclude PE.  Unfortunately, the D-dimer was elevated and a CT angio was performed to rule out PE.  The CT angio was negative for PE, pneumonia but did show some reactive prevascular lymph nodes.  Given the patient's history of asthma as well as coarse breath sounds, a DuoNeb was provided as well as steroids.  The patient was also started on azithromycin and will continue this on an outpatient basis for suspected acute bronchitis.  The patient is amenable with this plan and is stable at this time for outpatient therapy.      ____________________________________________   FINAL CLINICAL IMPRESSION(S) / ED DIAGNOSES  Final diagnoses:  Mild intermittent asthma with exacerbation  Viral upper respiratory tract infection  Acute bronchitis, unspecified organism     ED Discharge Orders         Ordered    azithromycin (ZITHROMAX Z-PAK) 250 MG tablet  04/23/20 2149     predniSONE (DELTASONE) 10 MG tablet        04/23/20 2149          *Please note:  LEEA RAMBEAU was evaluated in Emergency Department on 04/23/2020 for the symptoms described in the history of present illness. She was evaluated in the context of the global COVID-19 pandemic, which necessitated consideration that the patient might be at risk for infection with the SARS-CoV-2 virus that causes COVID-19. Institutional protocols and algorithms that pertain to the evaluation of patients at risk for COVID-19 are in a state of rapid change based on information released by regulatory bodies including the CDC and federal and state organizations. These policies and algorithms were followed during the patient's care in the ED.  Some ED evaluations and interventions may be delayed as a result of limited staffing during and the pandemic.*   Note:  This document was prepared using Dragon voice recognition software and may include unintentional dictation errors.    Lucy Chris, PA 04/23/20 2346    Arnaldo Natal, MD 04/24/20 0000

## 2020-05-24 ENCOUNTER — Emergency Department
Admission: EM | Admit: 2020-05-24 | Discharge: 2020-05-24 | Disposition: A | Discharge status: Home / Self Care | Payer: Self-pay | Attending: Emergency Medicine | Admitting: Emergency Medicine

## 2020-05-24 ENCOUNTER — Other Ambulatory Visit: Payer: Self-pay

## 2020-05-24 DIAGNOSIS — J45909 Unspecified asthma, uncomplicated: Secondary | ICD-10-CM | POA: Insufficient documentation

## 2020-05-24 DIAGNOSIS — R519 Headache, unspecified: Secondary | ICD-10-CM | POA: Insufficient documentation

## 2020-05-24 DIAGNOSIS — J029 Acute pharyngitis, unspecified: Secondary | ICD-10-CM | POA: Insufficient documentation

## 2020-05-24 DIAGNOSIS — Z20822 Contact with and (suspected) exposure to covid-19: Secondary | ICD-10-CM | POA: Insufficient documentation

## 2020-05-24 DIAGNOSIS — Z87891 Personal history of nicotine dependence: Secondary | ICD-10-CM | POA: Insufficient documentation

## 2020-05-24 DIAGNOSIS — M791 Myalgia, unspecified site: Secondary | ICD-10-CM | POA: Insufficient documentation

## 2020-05-24 LAB — GROUP A STREP BY PCR: Group A Strep by PCR: NOT DETECTED

## 2020-05-24 LAB — RESP PANEL BY RT-PCR (FLU A&B, COVID) ARPGX2
Influenza A by PCR: NEGATIVE
Influenza B by PCR: NEGATIVE
SARS Coronavirus 2 by RT PCR: NEGATIVE

## 2020-05-24 MED ORDER — FLUCONAZOLE 150 MG PO TABS: ORAL_TABLET | ORAL | 0 refills | Status: DC

## 2020-05-24 MED ORDER — AMOXICILLIN 875 MG PO TABS: 875.0000 mg | ORAL_TABLET | Freq: Two times a day (BID) | ORAL | 0 refills | Status: DC

## 2020-05-24 MED ORDER — IPRATROPIUM-ALBUTEROL 0.5-2.5 (3) MG/3ML IN SOLN: 3.0000 mL | Freq: Once | RESPIRATORY_TRACT | Status: DC

## 2020-05-24 NOTE — ED Notes (Signed)
See triage note. Pt presents to the ED for generalized body aches, sore throat, and headache. Pt has a hx of strep throat and states this feel the same as the previous. Pt is A&Ox4 and NAD. Ambulatory to room.

## 2020-05-24 NOTE — ED Triage Notes (Signed)
Pt comes via POV from home with c/o sore throat, headache and body aches. Pt states this all started few days ago.

## 2020-05-24 NOTE — Discharge Instructions (Addendum)
Follow-up with your regular doctor if not better in 3 days.  Return emergency department worsening.  Take medication as prescribed. 

## 2020-05-24 NOTE — ED Provider Notes (Signed)
[facility] Emergency Department Provider Note  ____________________________________________   First MD Initiated Contact with Patient 05/24/20 [id number]     (approximate)  I have reviewed the triage vital signs and the nursing notes.   HISTORY  Chief Complaint Sore Throat and Headache    HPI [name] is a 34 y.o. female presents emergency department complaining of sore throat and fever along with headache.  States she also has had body aches for the past 2 to 3 days.  History of strep throat this feels the same.  She denies any vomiting or diarrhea.  No chest pain or shortness of breath.    Past Medical History:  Diagnosis Date  . Allergy   . Anxiety   . Asthma 11/24/2019   blood allergy testing done 11/24/19 to see what causes it to flare up  . Bipolar 1 disorder (HCC)   . Bipolar disorder (HCC)   . Chronic cough    being worked up with Dr. Jayme CloudGonzalez  . Depression   . Dyspareunia in female 11/2019  . Dysrhythmia    TACHY BUT NOT A PROBLEM  . Ectopic pregnancy    had surgery  . GERD (gastroesophageal reflux disease)   . History of concussion 2016  . Hyperlipidemia   . Migraine    has an aura. happening frequently  . MRSA (methicillin resistant Staphylococcus aureus)    was on chin.  many years ago  . NSVD (normal spontaneous vaginal delivery)    x 2  . Poor dentition   . Pseudoseizures (HCC)    related to anxiety  . Scoliosis   . Scoliosis     Patient Active Problem List   Diagnosis Date Noted  . S/P laparoscopic hysterectomy 12/25/2019  . Pelvic pain 11/27/2019  . Dyspareunia due to medical condition in female 11/27/2019  . Dysmenorrhea 11/27/2019  . Radial styloid tenosynovitis 10/13/2019  . Bipolar affective disorder (HCC) 01/26/2015  . Seizures (HCC) 01/12/2015  . Asthma   . Hyperlipidemia   . Bipolar 2 disorder (HCC)   . Allergy   . Migraine headache with aura     Past Surgical History:  Procedure Laterality Date   . CARPAL TUNNEL RELEASE  2015   left  . CYSTOSCOPY  11/27/2019   Procedure: CYSTOSCOPY;  Surgeon: Nadara MustardHarris, Robert P, MD;  Location: [facility] ORS;  Service: Gynecology;;  . ECTOPIC PREGNANCY SURGERY  2011  . HERNIA REPAIR  2010  . TOTAL LAPAROSCOPIC HYSTERECTOMY WITH SALPINGECTOMY Bilateral 11/27/2019   Procedure: TOTAL LAPAROSCOPIC HYSTERECTOMY WITH SALPINGECTOMY;  Surgeon: Nadara MustardHarris, Robert P, MD;  Location: [facility] ORS;  Service: Gynecology;  Laterality: Bilateral;  needs RNFA  . TUBAL LIGATION      Prior to Admission medications   Medication Sig Start Date End Date Taking? Authorizing Provider  albuterol (VENTOLIN HFA) 108 (90 Base) MCG/ACT inhaler Inhale 2 puffs into the lungs every 4 (four) hours as needed for wheezing or shortness of breath. 07/08/19   Domenick GongMortenson, Ashley, MD  amoxicillin (AMOXIL) 875 MG tablet Take 1 tablet (875 mg total) by mouth 2 (two) times daily. 05/24/20   Beauregard Jarrells, Roselyn BeringSusan W, PA-C  ARIPiprazole (ABILIFY) 5 MG tablet Take 1 tablet (5 mg total) by mouth daily. 01/28/20 03/28/20  Pucilowski, Roosvelt Maserlgierd A, MD  benzonatate (TESSALON) 200 MG capsule Take 1 capsule (200 mg total) by mouth 3 (three) times daily as needed for cough. 10/13/19   Alba CorySowles, Krichna, MD  divalproex (DEPAKOTE ER) 500 MG 24 hr tablet Take 1 tablet (  500 mg total) by mouth at bedtime. 03/14/20 06/12/20  Pucilowski, Roosvelt Maser, MD  fluconazole (DIFLUCAN) 150 MG tablet Take one now and one in a week 05/24/20   Sherrie Mustache, Roselyn Bering, PA-C  FLUoxetine (PROZAC) 40 MG capsule Take 1 capsule (40 mg total) by mouth daily. 01/28/20 04/27/20  Pucilowski, Roosvelt Maser, MD  fluticasone (FLONASE) 50 MCG/ACT nasal spray Place 1 spray into both nostrils 2 (two) times daily. 09/07/18   Cuthriell, Delorise Royals, PA-C  Fluticasone-Umeclidin-Vilant (TRELEGY ELLIPTA) 200-62.5-25 MCG/INH AEPB Inhale 1 puff into the lungs daily. 11/24/19   Salena Saner, MD  ibuprofen (ADVIL) 600 MG tablet Take 1 tablet (600 mg total) by mouth every 6 (six) hours as needed.  07/08/19   Domenick Gong, MD  metroNIDAZOLE (FLAGYL) 500 MG tablet Take 1 tablet (500 mg total) by mouth 2 (two) times daily. 03/15/20   Cuthriell, Delorise Royals, PA-C  Multiple Vitamins-Minerals (MULTIVITAMIN WITH MINERALS) tablet Take 1 tablet by mouth daily.    [provider]  omeprazole (PRILOSEC) 20 MG capsule Take 1 capsule (20 mg total) by mouth 2 (two) times daily. 11/24/19 02/22/20  Salena Saner, MD  Spacer/Aero-Holding Chambers (AEROCHAMBER PLUS) inhaler Use as instructed 07/08/19   Domenick Gong, MD  traZODone (DESYREL) 100 MG tablet Take 1 tablet (100 mg total) by mouth at bedtime as needed for sleep. 03/14/20 06/12/20  Pucilowski, Roosvelt Maser, MD  vitamin B-12 (CYANOCOBALAMIN) 1000 MCG tablet Take 1,000 mcg by mouth daily.    [provider]    Allergies Robitussin (alcohol free) [guaifenesin], Strawberry extract, Tramadol, Aspirin, Chocolate flavor, and Codeine  Family History  Problem Relation Age of Onset  . Asthma Mother   . Heart disease Mother   . Diabetes Mother   . Hyperlipidemia Mother   . Hypertension Mother   . Mental illness Mother   . Migraines Mother   . Thyroid disease Mother   . Stroke Mother   . Alcohol abuse Mother   . Bipolar disorder Mother   . Anxiety disorder Mother   . COPD Mother   . Hypertension Sister   . Depression Sister   . Drug abuse Sister   . Cancer Maternal Grandmother   . Diabetes Maternal Grandmother   . Heart disease Maternal Grandmother   . Hyperlipidemia Maternal Grandmother   . Hypertension Maternal Grandmother   . Kidney disease Maternal Grandmother   . Heart disease Paternal Grandfather   . Alcohol abuse Paternal Grandfather   . Drug abuse Paternal Grandfather     Social History Social History   Tobacco Use  . Smoking status: Former Smoker    Packs/day: 1.00    Years: 15.00    Pack years: 15.00    Types: Cigarettes    Start date: 09/07/2019    Quit date: 08/2019    Years since quitting: 0.7   . Smokeless tobacco: Never Used  . Tobacco comment: did not want to quit but can't inhale w/ coughing  Vaping Use  . Vaping Use: Never used  Substance Use Topics  . Alcohol use: No    Alcohol/week: 0.0 standard drinks  . Drug use: No    Review of Systems  Constitutional: Pulse fever/chills Eyes: No visual changes. ENT: Positive sore throat. Respiratory: Denies cough Cardiovascular: Denies chest pain Gastrointestinal: Denies abdominal pain Genitourinary: Negative for dysuria. Musculoskeletal: Negative for back pain. Skin: Negative for rash. Psychiatric: no mood changes,     ____________________________________________   PHYSICAL EXAM:  VITAL SIGNS: ED Triage Vitals [05/24/20  0807]  Enc Vitals Group     BP 108/60     Pulse Rate (!) 106     Resp 18     Temp 99 F (37.2 C)     Temp src      SpO2 100 %     Weight 172 lb (78 kg)     Height 5\' 5"  (1.651 m)     Head Circumference      Peak Flow      Pain Score 5     Pain Loc      Pain Edu?      Excl. in GC?     Constitutional: Alert and oriented. Well appearing and in no acute distress. Eyes: Conjunctivae are normal.  Head: Atraumatic. Nose: No congestion/rhinnorhea. Mouth/Throat: Mucous membranes are moist.  Throat is red and swollen with some exudate noted posteriorly Neck:  supple no lymphadenopathy noted Cardiovascular: Normal rate, regular rhythm. Heart sounds are normal Respiratory: Normal respiratory effort.  No retractions, lungs c t a  GU: deferred Musculoskeletal: FROM all extremities, warm and well perfused Neurologic:  Normal speech and language.  Skin:  Skin is warm, dry and intact. No rash noted. Psychiatric: Mood and affect are normal. Speech and behavior are normal.  ____________________________________________   LABS (all labs ordered are listed, but only abnormal results are displayed)  Labs Reviewed  RESP PANEL BY RT-PCR (FLU A&B, COVID) ARPGX2  GROUP A STREP BY PCR    ____________________________________________   ____________________________________________  RADIOLOGY    ____________________________________________   PROCEDURES  Procedure(s) performed: No  Procedures    ____________________________________________   INITIAL IMPRESSION / ASSESSMENT AND PLAN / ED COURSE  Pertinent labs & imaging results that were available during my care of the patient were reviewed by me and considered in my medical decision making (see chart for details).   Patient is 34 year old female presents with a history of strep throat complaining of sore throat, headache and body aches.  See HPI.  Physical exam shows reddened throat with low-grade temp 99 and elevated heart rate of 106  Covid/flu test ordered, strep test ordered   Covid and flu test negative.  Strep test negative  I did review the results with the patient.  She was given a prescription for amoxicillin.  Splane to her if her symptoms worsen she may need a repeat Covid test.  She was also given Diflucan for yeast.  She is to follow-up with her regular doctor if not improved in 3 days.  Return emergency department worsening.  She is discharged stable condition.  KAYAL MULA was evaluated in Emergency Department on 05/24/2020 for the symptoms described in the history of present illness. She was evaluated in the context of the global COVID-19 pandemic, which necessitated consideration that the patient might be at risk for infection with the SARS-CoV-2 virus that causes COVID-19. Institutional protocols and algorithms that pertain to the evaluation of patients at risk for COVID-19 are in a state of rapid change based on information released by regulatory bodies including the CDC and federal and state organizations. These policies and algorithms were followed during the patient's care in the ED.    As part of my medical decision making, I reviewed the following data within the electronic medical  record:  Nursing notes reviewed and incorporated, Labs reviewed , Old chart reviewed, Notes from prior ED visits and [place] Controlled Substance Database  ____________________________________________   FINAL CLINICAL IMPRESSION(S) / ED DIAGNOSES  Final diagnoses:  Acute pharyngitis,  unspecified etiology      NEW MEDICATIONS STARTED DURING THIS VISIT:  New Prescriptions   AMOXICILLIN (AMOXIL) 875 MG TABLET    Take 1 tablet (875 mg total) by mouth 2 (two) times daily.   FLUCONAZOLE (DIFLUCAN) 150 MG TABLET    Take one now and one in a week     Note:  This document was prepared using Dragon voice recognition software and may include unintentional dictation errors.    Faythe Ghee, PA-C 05/24/20 1120    Merwyn Katos, MD 05/24/20 1534

## 2020-06-28 ENCOUNTER — Emergency Department: Payer: Self-pay

## 2020-06-28 ENCOUNTER — Other Ambulatory Visit: Payer: Self-pay

## 2020-06-28 ENCOUNTER — Emergency Department
Admission: EM | Admit: 2020-06-28 | Discharge: 2020-06-28 | Disposition: A | Discharge status: Home / Self Care | Payer: Self-pay | Attending: Emergency Medicine | Admitting: Emergency Medicine

## 2020-06-28 ENCOUNTER — Encounter: Payer: Self-pay | Admitting: Emergency Medicine

## 2020-06-28 DIAGNOSIS — R000 Tachycardia, unspecified: Secondary | ICD-10-CM

## 2020-06-28 DIAGNOSIS — R0602 Shortness of breath: Secondary | ICD-10-CM | POA: Insufficient documentation

## 2020-06-28 DIAGNOSIS — J45909 Unspecified asthma, uncomplicated: Secondary | ICD-10-CM | POA: Insufficient documentation

## 2020-06-28 DIAGNOSIS — Z7951 Long term (current) use of inhaled steroids: Secondary | ICD-10-CM | POA: Insufficient documentation

## 2020-06-28 DIAGNOSIS — R079 Chest pain, unspecified: Secondary | ICD-10-CM | POA: Insufficient documentation

## 2020-06-28 DIAGNOSIS — Z20822 Contact with and (suspected) exposure to covid-19: Secondary | ICD-10-CM | POA: Insufficient documentation

## 2020-06-28 DIAGNOSIS — R519 Headache, unspecified: Secondary | ICD-10-CM

## 2020-06-28 DIAGNOSIS — R002 Palpitations: Secondary | ICD-10-CM | POA: Insufficient documentation

## 2020-06-28 DIAGNOSIS — Z87891 Personal history of nicotine dependence: Secondary | ICD-10-CM | POA: Insufficient documentation

## 2020-06-28 DIAGNOSIS — Z79899 Other long term (current) drug therapy: Secondary | ICD-10-CM | POA: Insufficient documentation

## 2020-06-28 LAB — CBC
HCT: 41.1 % (ref 36.0–46.0)
Hemoglobin: 13.8 g/dL (ref 12.0–15.0)
MCH: 29.6 pg (ref 26.0–34.0)
MCHC: 33.6 g/dL (ref 30.0–36.0)
MCV: 88.2 fL (ref 80.0–100.0)
Platelets: 363 10*3/uL (ref 150–400)
RBC: 4.66 MIL/uL (ref 3.87–5.11)
RDW: 13.5 % (ref 11.5–15.5)
WBC: 17.1 10*3/uL — ABNORMAL HIGH (ref 4.0–10.5)
nRBC: 0 % (ref 0.0–0.2)

## 2020-06-28 LAB — URINALYSIS, COMPLETE (UACMP) WITH MICROSCOPIC
Bacteria, UA: NONE SEEN
Bilirubin Urine: NEGATIVE
Glucose, UA: NEGATIVE mg/dL
Hgb urine dipstick: NEGATIVE
Ketones, ur: NEGATIVE mg/dL
Leukocytes,Ua: NEGATIVE
Nitrite: NEGATIVE
Protein, ur: NEGATIVE mg/dL
Specific Gravity, Urine: 1.016 (ref 1.005–1.030)
pH: 5 (ref 5.0–8.0)

## 2020-06-28 LAB — BASIC METABOLIC PANEL
Anion gap: 10 (ref 5–15)
BUN: 14 mg/dL (ref 6–20)
CO2: 24 mmol/L (ref 22–32)
Calcium: 9.9 mg/dL (ref 8.9–10.3)
Chloride: 103 mmol/L (ref 98–111)
Creatinine, Ser: 0.76 mg/dL (ref 0.44–1.00)
GFR, Estimated: >60 mL/min (ref >60)
Glucose, Bld: 116 mg/dL — ABNORMAL HIGH (ref 70–99)
Potassium: 3.8 mmol/L (ref 3.5–5.1)
Sodium: 137 mmol/L (ref 135–145)

## 2020-06-28 LAB — POC URINE PREG, ED: Preg Test, Ur: NEGATIVE

## 2020-06-28 LAB — RESP PANEL BY RT-PCR (FLU A&B, COVID) ARPGX2
Influenza A by PCR: NEGATIVE
Influenza B by PCR: NEGATIVE
SARS Coronavirus 2 by RT PCR: NEGATIVE

## 2020-06-28 MED ORDER — SODIUM CHLORIDE 0.9 % IV BOLUS
1000.0000 mL | Freq: Once | INTRAVENOUS | Status: AC
  Administered 2020-06-28: 1000 mL via INTRAVENOUS

## 2020-06-28 MED ORDER — METOCLOPRAMIDE HCL 5 MG/ML IJ SOLN
10.0000 mg | Freq: Once | INTRAMUSCULAR | Status: AC
  Administered 2020-06-28: 10 mg via INTRAVENOUS
  Filled 2020-06-28: qty 2

## 2020-06-28 MED ORDER — KETOROLAC TROMETHAMINE 30 MG/ML IJ SOLN
30.0000 mg | Freq: Once | INTRAMUSCULAR | Status: AC
  Administered 2020-06-28: 30 mg via INTRAVENOUS
  Filled 2020-06-28: qty 1

## 2020-06-28 MED ORDER — IOHEXOL 350 MG/ML SOLN
75.0000 mL | Freq: Once | INTRAVENOUS | Status: AC | PRN
  Administered 2020-06-28: 75 mL via INTRAVENOUS
  Filled 2020-06-28: qty 75

## 2020-06-28 MED ORDER — IOHEXOL 300 MG/ML  SOLN
100.0000 mL | Freq: Once | INTRAMUSCULAR | Status: DC | PRN
  Filled 2020-06-28: qty 100

## 2020-06-28 NOTE — ED Triage Notes (Signed)
Patient presents to the ED with palpitations since yesterday as well as a headache and feeling very tired.  Patient states, "I feel like my heart is going to beat out of my chest.  Patient is in no obvious distress at this time.  Patient reports cold symptoms last week and states she was covid negative.

## 2020-06-28 NOTE — ED Provider Notes (Signed)
Work-up is reassuring.  Patient stable and appropriate for outpatient follow-up   Willy Eddy, MD 06/28/20 2118

## 2020-06-28 NOTE — ED Provider Notes (Signed)
[facility] Emergency Department Provider Note ____________________________________________   Event Date/Time   First MD Initiated Contact with Patient 06/28/20 1910     (approximate)  I have reviewed the triage vital signs and the nursing notes.   HISTORY  Chief Complaint Palpitations    HPI [name] is a 35 y.o. female PMH as noted below who presents with 2 primary complaints, headache and palpitations.  The headache is bilateral and frontal.  It started gradually yesterday and has worsened today.  It is associated with photophobia but not with any nausea or vomiting.  The patient denies any visual changes.  She has no fever or neck stiffness.  She states that it is different than her normal headaches.  She denies any recent head trauma.  She describes the palpitations as a sensation of her heart beating fast and hard.  She denies any associated chest pain or shortness of breath but does report generalized fatigue.  She has a history of a fast heart rate but states that she has never been diagnosed with anything.  She denies any fever or chills.  She has a mild cough but states this improved after a recent course of steroids.  She denies any leg pain or swelling.  Past Medical History:  Diagnosis Date  . Allergy   . Anxiety   . Asthma 11/24/2019   blood allergy testing done 11/24/19 to see what causes it to flare up  . Bipolar 1 disorder (HCC)   . Bipolar disorder (HCC)   . Chronic cough    being worked up with Dr. Jayme Cloud  . Depression   . Dyspareunia in female 11/2019  . Dysrhythmia    TACHY BUT NOT A PROBLEM  . Ectopic pregnancy    had surgery  . GERD (gastroesophageal reflux disease)   . History of concussion 2016  . Hyperlipidemia   . Migraine    has an aura. happening frequently  . MRSA (methicillin resistant Staphylococcus aureus)    was on chin.  many years ago  . NSVD (normal spontaneous vaginal delivery)    x 2  . Poor  dentition   . Pseudoseizures (HCC)    related to anxiety  . Scoliosis   . Scoliosis     Patient Active Problem List   Diagnosis Date Noted  . S/P laparoscopic hysterectomy 12/25/2019  . Pelvic pain 11/27/2019  . Dyspareunia due to medical condition in female 11/27/2019  . Dysmenorrhea 11/27/2019  . Radial styloid tenosynovitis 10/13/2019  . Bipolar affective disorder (HCC) 01/26/2015  . Seizures (HCC) 01/12/2015  . Asthma   . Hyperlipidemia   . Bipolar 2 disorder (HCC)   . Allergy   . Migraine headache with aura     Past Surgical History:  Procedure Laterality Date  . CARPAL TUNNEL RELEASE  2015   left  . CYSTOSCOPY  11/27/2019   Procedure: CYSTOSCOPY;  Surgeon: Nadara Mustard, MD;  Location: [facility] ORS;  Service: Gynecology;;  . ECTOPIC PREGNANCY SURGERY  2011  . HERNIA REPAIR  2010  . TOTAL LAPAROSCOPIC HYSTERECTOMY WITH SALPINGECTOMY Bilateral 11/27/2019   Procedure: TOTAL LAPAROSCOPIC HYSTERECTOMY WITH SALPINGECTOMY;  Surgeon: Nadara Mustard, MD;  Location: [facility] ORS;  Service: Gynecology;  Laterality: Bilateral;  needs RNFA  . TUBAL LIGATION      Prior to Admission medications   Medication Sig Start Date End Date Taking? Authorizing Provider  albuterol (VENTOLIN HFA) 108 (90 Base) MCG/ACT inhaler Inhale 2 puffs into the lungs every  4 (four) hours as needed for wheezing or shortness of breath. 07/08/19   Melynda Ripple, MD  amoxicillin (AMOXIL) 875 MG tablet Take 1 tablet (875 mg total) by mouth 2 (two) times daily. 05/24/20   Fisher, Linden Dolin, PA-C  ARIPiprazole (ABILIFY) 5 MG tablet Take 1 tablet (5 mg total) by mouth daily. 01/28/20 03/28/20  Pucilowski, Marchia Bond, MD  benzonatate (TESSALON) 200 MG capsule Take 1 capsule (200 mg total) by mouth 3 (three) times daily as needed for cough. 10/13/19   Steele Sizer, MD  divalproex (DEPAKOTE ER) 500 MG 24 hr tablet Take 1 tablet (500 mg total) by mouth at bedtime. 03/14/20 06/12/20  Pucilowski, Marchia Bond, MD  fluconazole  (DIFLUCAN) 150 MG tablet Take one now and one in a week 05/24/20   Caryn Section, Linden Dolin, PA-C  FLUoxetine (PROZAC) 40 MG capsule Take 1 capsule (40 mg total) by mouth daily. 01/28/20 04/27/20  Pucilowski, Marchia Bond, MD  fluticasone (FLONASE) 50 MCG/ACT nasal spray Place 1 spray into both nostrils 2 (two) times daily. 09/07/18   Cuthriell, Charline Bills, PA-C  Fluticasone-Umeclidin-Vilant (TRELEGY ELLIPTA) 200-62.5-25 MCG/INH AEPB Inhale 1 puff into the lungs daily. 11/24/19   Tyler Pita, MD  ibuprofen (ADVIL) 600 MG tablet Take 1 tablet (600 mg total) by mouth every 6 (six) hours as needed. 07/08/19   Melynda Ripple, MD  metroNIDAZOLE (FLAGYL) 500 MG tablet Take 1 tablet (500 mg total) by mouth 2 (two) times daily. 03/15/20   Cuthriell, Charline Bills, PA-C  Multiple Vitamins-Minerals (MULTIVITAMIN WITH MINERALS) tablet Take 1 tablet by mouth daily.    [provider]  omeprazole (PRILOSEC) 20 MG capsule Take 1 capsule (20 mg total) by mouth 2 (two) times daily. 11/24/19 02/22/20  Tyler Pita, MD  Spacer/Aero-Holding Chambers (AEROCHAMBER PLUS) inhaler Use as instructed 07/08/19   Melynda Ripple, MD  traZODone (DESYREL) 100 MG tablet Take 1 tablet (100 mg total) by mouth at bedtime as needed for sleep. 03/14/20 06/12/20  Pucilowski, Marchia Bond, MD  vitamin B-12 (CYANOCOBALAMIN) 1000 MCG tablet Take 1,000 mcg by mouth daily.    [provider]    Allergies Robitussin (alcohol free) [guaifenesin], Strawberry extract, Tramadol, Aspirin, Chocolate flavor, and Codeine  Family History  Problem Relation Age of Onset  . Asthma Mother   . Heart disease Mother   . Diabetes Mother   . Hyperlipidemia Mother   . Hypertension Mother   . Mental illness Mother   . Migraines Mother   . Thyroid disease Mother   . Stroke Mother   . Alcohol abuse Mother   . Bipolar disorder Mother   . Anxiety disorder Mother   . COPD Mother   . Hypertension Sister   . Depression Sister   . Drug abuse Sister    . Cancer Maternal Grandmother   . Diabetes Maternal Grandmother   . Heart disease Maternal Grandmother   . Hyperlipidemia Maternal Grandmother   . Hypertension Maternal Grandmother   . Kidney disease Maternal Grandmother   . Heart disease Paternal Grandfather   . Alcohol abuse Paternal Grandfather   . Drug abuse Paternal Grandfather     Social History Social History   Tobacco Use  . Smoking status: Former Smoker    Packs/day: 1.00    Years: 15.00    Pack years: 15.00    Types: Cigarettes    Start date: 09/07/2019    Quit date: 08/2019    Years since quitting: 0.8  . Smokeless tobacco: Never Used  .  Tobacco comment: did not want to quit but can't inhale w/ coughing  Vaping Use  . Vaping Use: Never used  Substance Use Topics  . Alcohol use: No    Alcohol/week: 0.0 standard drinks  . Drug use: No    Review of Systems  Constitutional: No fever.  Positive for fatigue. Eyes: No visual changes. ENT: No sore throat. Cardiovascular: Denies chest pain.  Positive for palpitations. Respiratory: Denies shortness of breath. Gastrointestinal: No vomiting or diarrhea.  Genitourinary: Negative for dysuria.  Musculoskeletal: Negative for back pain. Skin: Negative for rash. Neurological: Positive for headache, negative for focal weakness or numbness.   ____________________________________________   PHYSICAL EXAM:  VITAL SIGNS: ED Triage Vitals  Enc Vitals Group     BP 06/28/20 1710 117/84     Pulse Rate 06/28/20 1710 (!) 125     Resp 06/28/20 1710 20     Temp 06/28/20 1710 97.8 F (36.6 C)     Temp Source 06/28/20 1710 Oral     SpO2 06/28/20 1710 100 %     Weight 06/28/20 1711 184 lb (83.5 kg)     Height 06/28/20 1711 5\' 5"  (1.651 m)     Head Circumference --      Peak Flow --      Pain Score 06/28/20 1711 8     Pain Loc --      Pain Edu? --      Excl. in GC? --     Constitutional: Alert and oriented. Well appearing and in no acute distress. Eyes: Conjunctivae  are normal.  EOMI.  PERRLA. Head: Atraumatic. Nose: No congestion/rhinnorhea. Mouth/Throat: Mucous membranes are moist.  Oropharynx clear with no erythema or exudate. Neck: Normal range of motion.  No meningeal signs. Cardiovascular: Normal rate, regular rhythm. Grossly normal heart sounds.  Good peripheral circulation. Respiratory: Normal respiratory effort.  No retractions. Lungs CTAB. Gastrointestinal: No distention.  Musculoskeletal: No lower extremity edema.  No calf or popliteal swelling or tenderness.  Extremities warm and well perfused.  Neurologic:  Normal speech and language. No gross focal neurologic deficits are appreciated.  Skin:  Skin is warm and dry. No rash noted. Psychiatric: Mood and affect are normal. Speech and behavior are normal.  ____________________________________________   LABS (all labs ordered are listed, but only abnormal results are displayed)  Labs Reviewed  BASIC METABOLIC PANEL - Abnormal; Notable for the following components:      Result Value   Glucose, Bld 116 (*)    All other components within normal limits  CBC - Abnormal; Notable for the following components:   WBC 17.1 (*)    All other components within normal limits  RESP PANEL BY RT-PCR (FLU A&B, COVID) ARPGX2  URINALYSIS, COMPLETE (UACMP) WITH MICROSCOPIC  POC URINE PREG, ED   ____________________________________________  EKG  ED ECG REPORT I, 08/26/20, the attending physician, personally viewed and interpreted this ECG.  Date: 06/28/2020 EKG Time: 1714 Rate: 126 Rhythm: Sinus tachycardia QRS Axis: Right axis Intervals: normal ST/T Wave abnormalities: normal Narrative Interpretation: Sinus tachycardia with no evidence of acute ischemia  ____________________________________________  RADIOLOGY  Chest x-ray interpreted by me shows no focal infiltrate or edema CT angio chest: Pending  ____________________________________________   PROCEDURES  Procedure(s)  performed: No  Procedures  Critical Care performed: No ____________________________________________   INITIAL IMPRESSION / ASSESSMENT AND PLAN / ED COURSE  Pertinent labs & imaging results that were available during my care of the patient were reviewed by me and considered in  my medical decision making (see chart for details).  35 year old female with PMH as noted above presents with headache, palpitations, and fatigue since yesterday.  I reviewed the past medical records in Epic.  The patient was most recently seen in the ED in late November with URI type symptoms, in October with cough, and in September with abdominal pain.  It appears on prior visits that she is typically tachycardic in the ED with a rate around 100-115.  On exam, the patient is overall well-appearing.  Her vital signs are normal except for tachycardia to 125.  Neurologic exam is normal.  There are no meningeal signs.  The physical exam is otherwise unremarkable.  EKG shows sinus tachycardia with no acute ischemic changes.  Headache: Differential includes tension or migraine headache.  Given the normal neurologic exam, lack of fever or meningeal signs, gradual onset, and associated symptoms, there is no evidence of SAH, meningitis, or other worrisome cause of the headache.  There is no indication for imaging.  We will give a migraine cocktail and fluids.  Palpitations/tachycardia: It appears that the patient has some tachycardia at baseline although the rate is higher today.  Initial labs also reveal leukocytosis.  Differential includes dehydration/hypovolemia, bronchitis, pneumonia, COVID-19, other infection, or less likely PE.  There is no clinical evidence for ACS or cardiac arrhythmia.  Chest x-ray shows no acute findings.  D-dimer is currently unavailable in-house and would need to be a send out test which could take many hours, so we will proceed with CT to rule out these other  etiologies.  ----------------------------------------- 8:45 PM on 06/28/2020 -----------------------------------------  CT chest, Covid, and urinalysis are pending.  I anticipate discharge home if the work-up is negative.  I have signed the patient out to the oncoming ED physician Dr. Roxan Hockey.  ____________________________________________   FINAL CLINICAL IMPRESSION(S) / ED DIAGNOSES  Final diagnoses:  Acute nonintractable headache, unspecified headache type  Sinus tachycardia      NEW MEDICATIONS STARTED DURING THIS VISIT:  New Prescriptions   No medications on file     Note:  This document was prepared using Dragon voice recognition software and may include unintentional dictation errors.    Dionne Bucy, MD 06/28/20 2045

## 2020-06-28 NOTE — Discharge Instructions (Addendum)

## 2020-08-02 ENCOUNTER — Other Ambulatory Visit: Payer: Self-pay

## 2020-08-02 ENCOUNTER — Emergency Department
Admission: EM | Admit: 2020-08-02 | Discharge: 2020-08-02 | Disposition: A | Discharge status: Home / Self Care | Payer: Self-pay | Attending: Emergency Medicine | Admitting: Emergency Medicine

## 2020-08-02 ENCOUNTER — Inpatient Hospital Stay
Admission: RE | Admit: 2020-08-02 | Discharge: 2020-08-04 | DRG: 885 | Disposition: A | Discharge status: Home / Self Care | Payer: 59 | Source: Intra-hospital | Attending: Behavioral Health | Admitting: Behavioral Health

## 2020-08-02 DIAGNOSIS — R569 Unspecified convulsions: Secondary | ICD-10-CM | POA: Diagnosis present

## 2020-08-02 DIAGNOSIS — F3181 Bipolar II disorder: Secondary | ICD-10-CM | POA: Diagnosis present

## 2020-08-02 DIAGNOSIS — F410 Panic disorder [episodic paroxysmal anxiety] without agoraphobia: Secondary | ICD-10-CM | POA: Diagnosis present

## 2020-08-02 DIAGNOSIS — Z886 Allergy status to analgesic agent status: Secondary | ICD-10-CM

## 2020-08-02 DIAGNOSIS — Z91018 Allergy to other foods: Secondary | ICD-10-CM

## 2020-08-02 DIAGNOSIS — F5105 Insomnia due to other mental disorder: Secondary | ICD-10-CM | POA: Diagnosis present

## 2020-08-02 DIAGNOSIS — R45851 Suicidal ideations: Secondary | ICD-10-CM | POA: Insufficient documentation

## 2020-08-02 DIAGNOSIS — F3130 Bipolar disorder, current episode depressed, mild or moderate severity, unspecified: Secondary | ICD-10-CM | POA: Insufficient documentation

## 2020-08-02 DIAGNOSIS — E785 Hyperlipidemia, unspecified: Secondary | ICD-10-CM | POA: Diagnosis present

## 2020-08-02 DIAGNOSIS — Z79899 Other long term (current) drug therapy: Secondary | ICD-10-CM | POA: Insufficient documentation

## 2020-08-02 DIAGNOSIS — F411 Generalized anxiety disorder: Secondary | ICD-10-CM | POA: Diagnosis present

## 2020-08-02 DIAGNOSIS — Z888 Allergy status to other drugs, medicaments and biological substances status: Secondary | ICD-10-CM

## 2020-08-02 DIAGNOSIS — Z90710 Acquired absence of both cervix and uterus: Secondary | ICD-10-CM

## 2020-08-02 DIAGNOSIS — Z9151 Personal history of suicidal behavior: Secondary | ICD-10-CM | POA: Diagnosis not present

## 2020-08-02 DIAGNOSIS — G4700 Insomnia, unspecified: Secondary | ICD-10-CM | POA: Diagnosis present

## 2020-08-02 DIAGNOSIS — J45909 Unspecified asthma, uncomplicated: Secondary | ICD-10-CM | POA: Diagnosis present

## 2020-08-02 DIAGNOSIS — Z87891 Personal history of nicotine dependence: Secondary | ICD-10-CM

## 2020-08-02 DIAGNOSIS — Z885 Allergy status to narcotic agent status: Secondary | ICD-10-CM

## 2020-08-02 DIAGNOSIS — F3175 Bipolar disorder, in partial remission, most recent episode depressed: Secondary | ICD-10-CM

## 2020-08-02 DIAGNOSIS — Z818 Family history of other mental and behavioral disorders: Secondary | ICD-10-CM

## 2020-08-02 DIAGNOSIS — R110 Nausea: Secondary | ICD-10-CM | POA: Insufficient documentation

## 2020-08-02 DIAGNOSIS — Z20822 Contact with and (suspected) exposure to covid-19: Secondary | ICD-10-CM | POA: Insufficient documentation

## 2020-08-02 DIAGNOSIS — R519 Headache, unspecified: Secondary | ICD-10-CM | POA: Insufficient documentation

## 2020-08-02 LAB — LIPID PANEL
Cholesterol: 215 mg/dL — ABNORMAL HIGH (ref 0–200)
HDL: 45 mg/dL (ref >40)
LDL Cholesterol: 142 mg/dL — ABNORMAL HIGH (ref 0–99)
Total CHOL/HDL Ratio: 4.8 RATIO
Triglycerides: 141 mg/dL (ref <150)
VLDL: 28 mg/dL (ref 0–40)

## 2020-08-02 LAB — COMPREHENSIVE METABOLIC PANEL
ALT: 18 U/L (ref 0–44)
AST: 22 U/L (ref 15–41)
Albumin: 4.5 g/dL (ref 3.5–5.0)
Alkaline Phosphatase: 64 U/L (ref 38–126)
Anion gap: 11 (ref 5–15)
BUN: 7 mg/dL (ref 6–20)
CO2: 20 mmol/L — ABNORMAL LOW (ref 22–32)
Calcium: 9.8 mg/dL (ref 8.9–10.3)
Chloride: 107 mmol/L (ref 98–111)
Creatinine, Ser: 0.57 mg/dL (ref 0.44–1.00)
GFR, Estimated: >60 mL/min (ref >60)
Glucose, Bld: 106 mg/dL — ABNORMAL HIGH (ref 70–99)
Potassium: 4 mmol/L (ref 3.5–5.1)
Sodium: 138 mmol/L (ref 135–145)
Total Bilirubin: 0.3 mg/dL (ref 0.3–1.2)
Total Protein: 8.1 g/dL (ref 6.5–8.1)

## 2020-08-02 LAB — CBC
HCT: 41.1 % (ref 36.0–46.0)
Hemoglobin: 13.8 g/dL (ref 12.0–15.0)
MCH: 29.2 pg (ref 26.0–34.0)
MCHC: 33.6 g/dL (ref 30.0–36.0)
MCV: 87.1 fL (ref 80.0–100.0)
Platelets: 374 10*3/uL (ref 150–400)
RBC: 4.72 MIL/uL (ref 3.87–5.11)
RDW: 12.6 % (ref 11.5–15.5)
WBC: 17.1 10*3/uL — ABNORMAL HIGH (ref 4.0–10.5)
nRBC: 0 % (ref 0.0–0.2)

## 2020-08-02 LAB — URINE DRUG SCREEN, QUALITATIVE (ARMC ONLY)
Amphetamines, Ur Screen: NOT DETECTED
Barbiturates, Ur Screen: NOT DETECTED
Benzodiazepine, Ur Scrn: NOT DETECTED
Cannabinoid 50 Ng, Ur ~~LOC~~: NOT DETECTED
Cocaine Metabolite,Ur ~~LOC~~: NOT DETECTED
MDMA (Ecstasy)Ur Screen: NOT DETECTED
Methadone Scn, Ur: NOT DETECTED
Opiate, Ur Screen: NOT DETECTED
Phencyclidine (PCP) Ur S: NOT DETECTED
Tricyclic, Ur Screen: NOT DETECTED

## 2020-08-02 LAB — SALICYLATE LEVEL: Salicylate Lvl: <7 mg/dL — ABNORMAL LOW (ref 7.0–30.0)

## 2020-08-02 LAB — ACETAMINOPHEN LEVEL: Acetaminophen (Tylenol), Serum: <10 ug/mL — ABNORMAL LOW (ref 10–30)

## 2020-08-02 LAB — HEMOGLOBIN A1C
Hgb A1c MFr Bld: 5.5 % (ref 4.8–5.6)
Mean Plasma Glucose: 111.15 mg/dL

## 2020-08-02 LAB — ETHANOL: Alcohol, Ethyl (B): <10 mg/dL (ref <10)

## 2020-08-02 LAB — RESP PANEL BY RT-PCR (FLU A&B, COVID) ARPGX2
Influenza A by PCR: NEGATIVE
Influenza B by PCR: NEGATIVE
SARS Coronavirus 2 by RT PCR: NEGATIVE

## 2020-08-02 LAB — VALPROIC ACID LEVEL: Valproic Acid Lvl: <10 ug/mL — ABNORMAL LOW (ref 50.0–100.0)

## 2020-08-02 MED ORDER — ARIPIPRAZOLE 5 MG PO TABS
5.0000 mg | ORAL_TABLET | Freq: Every day | ORAL | Status: DC
  Administered 2020-08-02: 5 mg via ORAL
  Filled 2020-08-02 (×2): qty 1

## 2020-08-02 MED ORDER — ACETAMINOPHEN 325 MG PO TABS
650.0000 mg | ORAL_TABLET | Freq: Once | ORAL | Status: AC
  Administered 2020-08-02: 650 mg via ORAL
  Filled 2020-08-02: qty 2

## 2020-08-02 MED ORDER — ONDANSETRON 4 MG PO TBDP
4.0000 mg | ORAL_TABLET | Freq: Once | ORAL | Status: AC
  Administered 2020-08-02: 4 mg via ORAL
  Filled 2020-08-02: qty 1

## 2020-08-02 MED ORDER — DIVALPROEX SODIUM 500 MG PO DR TAB
500.0000 mg | DELAYED_RELEASE_TABLET | Freq: Every day | ORAL | Status: DC
  Administered 2020-08-02: 500 mg via ORAL
  Filled 2020-08-02: qty 1

## 2020-08-02 MED ORDER — ACETAMINOPHEN 500 MG PO TABS
1000.0000 mg | ORAL_TABLET | Freq: Once | ORAL | Status: AC
  Administered 2020-08-02: 1000 mg via ORAL
  Filled 2020-08-02: qty 2

## 2020-08-02 MED ORDER — FLUOXETINE HCL 20 MG PO CAPS
40.0000 mg | ORAL_CAPSULE | Freq: Every day | ORAL | Status: DC
  Administered 2020-08-02: 40 mg via ORAL
  Filled 2020-08-02: qty 2

## 2020-08-02 NOTE — ED Provider Notes (Signed)
[facility] Emergency Department Provider Note   ____________________________________________   Event Date/Time   First MD Initiated Contact with Patient 08/02/20 1152     (approximate)  I have reviewed the triage vital signs and the nursing notes.   HISTORY  Chief Complaint SI    HPI [name] is a 35 y.o. female with past medical history of bipolar disorder, hyperlipidemia, and pseudoseizures who presents to the ED for suicidal ideation.  Patient reports that she "cannot take it anymore" and has been having increasing thoughts of depression and suicide over the past week or so.  She reports thoughts of cutting herself and had a knife earlier today with the intention of doing so.  Her ex-husband was able to get the knife away from her, at which point patient decided to come to the ED to seek help.  She complains of some headache and nausea currently, denies any fevers, cough, chest pain, or shortness of breath.        Past Medical History:  Diagnosis Date  . Allergy   . Anxiety   . Asthma 11/24/2019   blood allergy testing done 11/24/19 to see what causes it to flare up  . Bipolar 1 disorder (HCC)   . Bipolar disorder (HCC)   . Chronic cough    being worked up with Dr. Jayme Cloud  . Depression   . Dyspareunia in female 11/2019  . Dysrhythmia    TACHY BUT NOT A PROBLEM  . Ectopic pregnancy    had surgery  . GERD (gastroesophageal reflux disease)   . History of concussion 2016  . Hyperlipidemia   . Migraine    has an aura. happening frequently  . MRSA (methicillin resistant Staphylococcus aureus)    was on chin.  many years ago  . NSVD (normal spontaneous vaginal delivery)    x 2  . Poor dentition   . Pseudoseizures (HCC)    related to anxiety  . Scoliosis   . Scoliosis     Patient Active Problem List   Diagnosis Date Noted  . S/P laparoscopic hysterectomy 12/25/2019  . Pelvic pain 11/27/2019  . Dyspareunia due to medical  condition in female 11/27/2019  . Dysmenorrhea 11/27/2019  . Radial styloid tenosynovitis 10/13/2019  . Bipolar affective disorder (HCC) 01/26/2015  . Seizures (HCC) 01/12/2015  . Asthma   . Hyperlipidemia   . Bipolar 2 disorder (HCC)   . Allergy   . Migraine headache with aura     Past Surgical History:  Procedure Laterality Date  . CARPAL TUNNEL RELEASE  2015   left  . CYSTOSCOPY  11/27/2019   Procedure: CYSTOSCOPY;  Surgeon: Nadara Mustard, MD;  Location: [facility] ORS;  Service: Gynecology;;  . ECTOPIC PREGNANCY SURGERY  2011  . HERNIA REPAIR  2010  . TOTAL LAPAROSCOPIC HYSTERECTOMY WITH SALPINGECTOMY Bilateral 11/27/2019   Procedure: TOTAL LAPAROSCOPIC HYSTERECTOMY WITH SALPINGECTOMY;  Surgeon: Nadara Mustard, MD;  Location: [facility] ORS;  Service: Gynecology;  Laterality: Bilateral;  needs RNFA  . TUBAL LIGATION      Prior to Admission medications   Medication Sig Start Date End Date Taking? Authorizing Provider  albuterol (VENTOLIN HFA) 108 (90 Base) MCG/ACT inhaler Inhale 2 puffs into the lungs every 4 (four) hours as needed for wheezing or shortness of breath. 07/08/19   Domenick Gong, MD  amoxicillin (AMOXIL) 875 MG tablet Take 1 tablet (875 mg total) by mouth 2 (two) times daily. 05/24/20   Faythe Ghee, PA-C  ARIPiprazole (ABILIFY) 5 MG tablet Take 1 tablet (5 mg total) by mouth daily. 01/28/20 03/28/20  Pucilowski, Roosvelt Maser, MD  benzonatate (TESSALON) 200 MG capsule Take 1 capsule (200 mg total) by mouth 3 (three) times daily as needed for cough. 10/13/19   Alba Cory, MD  divalproex (DEPAKOTE ER) 500 MG 24 hr tablet Take 1 tablet (500 mg total) by mouth at bedtime. 03/14/20 06/12/20  Pucilowski, Roosvelt Maser, MD  fluconazole (DIFLUCAN) 150 MG tablet Take one now and one in a week 05/24/20   Sherrie Mustache, Roselyn Bering, PA-C  FLUoxetine (PROZAC) 40 MG capsule Take 1 capsule (40 mg total) by mouth daily. 01/28/20 04/27/20  Pucilowski, Roosvelt Maser, MD  fluticasone (FLONASE) 50 MCG/ACT nasal  spray Place 1 spray into both nostrils 2 (two) times daily. 09/07/18   Cuthriell, Delorise Royals, PA-C  Fluticasone-Umeclidin-Vilant (TRELEGY ELLIPTA) 200-62.5-25 MCG/INH AEPB Inhale 1 puff into the lungs daily. 11/24/19   Salena Saner, MD  ibuprofen (ADVIL) 600 MG tablet Take 1 tablet (600 mg total) by mouth every 6 (six) hours as needed. 07/08/19   Domenick Gong, MD  metroNIDAZOLE (FLAGYL) 500 MG tablet Take 1 tablet (500 mg total) by mouth 2 (two) times daily. 03/15/20   Cuthriell, Delorise Royals, PA-C  Multiple Vitamins-Minerals (MULTIVITAMIN WITH MINERALS) tablet Take 1 tablet by mouth daily.    [provider]  omeprazole (PRILOSEC) 20 MG capsule Take 1 capsule (20 mg total) by mouth 2 (two) times daily. 11/24/19 02/22/20  Salena Saner, MD  Spacer/Aero-Holding Chambers (AEROCHAMBER PLUS) inhaler Use as instructed 07/08/19   Domenick Gong, MD  traZODone (DESYREL) 100 MG tablet Take 1 tablet (100 mg total) by mouth at bedtime as needed for sleep. 03/14/20 06/12/20  Pucilowski, Roosvelt Maser, MD  vitamin B-12 (CYANOCOBALAMIN) 1000 MCG tablet Take 1,000 mcg by mouth daily.    [provider]    Allergies Robitussin (alcohol free) [guaifenesin], Strawberry extract, Tramadol, Aspirin, Chocolate flavor, and Codeine  Family History  Problem Relation Age of Onset  . Asthma Mother   . Heart disease Mother   . Diabetes Mother   . Hyperlipidemia Mother   . Hypertension Mother   . Mental illness Mother   . Migraines Mother   . Thyroid disease Mother   . Stroke Mother   . Alcohol abuse Mother   . Bipolar disorder Mother   . Anxiety disorder Mother   . COPD Mother   . Hypertension Sister   . Depression Sister   . Drug abuse Sister   . Cancer Maternal Grandmother   . Diabetes Maternal Grandmother   . Heart disease Maternal Grandmother   . Hyperlipidemia Maternal Grandmother   . Hypertension Maternal Grandmother   . Kidney disease Maternal Grandmother   . Heart disease  Paternal Grandfather   . Alcohol abuse Paternal Grandfather   . Drug abuse Paternal Grandfather     Social History Social History   Tobacco Use  . Smoking status: Former Smoker    Packs/day: 1.00    Years: 15.00    Pack years: 15.00    Types: Cigarettes    Start date: 09/07/2019    Quit date: 08/2019    Years since quitting: 0.9  . Smokeless tobacco: Never Used  . Tobacco comment: did not want to quit but can't inhale w/ coughing  Vaping Use  . Vaping Use: Never used  Substance Use Topics  . Alcohol use: No    Alcohol/week: 0.0 standard drinks  . Drug use: No  Review of Systems  Constitutional: No fever/chills Eyes: No visual changes. ENT: No sore throat. Cardiovascular: Denies chest pain. Respiratory: Denies shortness of breath. Gastrointestinal: No abdominal pain.  Positive for nausea, no vomiting.  No diarrhea.  No constipation. Genitourinary: Negative for dysuria. Musculoskeletal: Negative for back pain. Skin: Negative for rash. Neurological: Positive for headaches, negative for focal weakness or numbness.  ____________________________________________   PHYSICAL EXAM:  VITAL SIGNS: ED Triage Vitals  Enc Vitals Group     BP 08/02/20 1146 124/80     Pulse Rate 08/02/20 1146 (!) 130     Resp 08/02/20 1146 18     Temp 08/02/20 1146 98.3 F (36.8 C)     Temp src --      SpO2 08/02/20 1146 100 %     Weight 08/02/20 1139 178 lb (80.7 kg)     Height 08/02/20 1139 5\' 5"  (1.651 m)     Head Circumference --      Peak Flow --      Pain Score 08/02/20 1139 0     Pain Loc --      Pain Edu? --      Excl. in GC? --     Constitutional: Alert and oriented. Eyes: Conjunctivae are normal. Head: Atraumatic. Nose: No congestion/rhinnorhea. Mouth/Throat: Mucous membranes are moist. Neck: Normal ROM Cardiovascular: Normal rate, regular rhythm. Grossly normal heart sounds. Respiratory: Normal respiratory effort.  No retractions. Lungs CTAB. Gastrointestinal: Soft  and nontender. No distention. Genitourinary: deferred Musculoskeletal: No lower extremity tenderness nor edema. Neurologic:  Normal speech and language. No gross focal neurologic deficits are appreciated. Skin:  Skin is warm, dry and intact. No rash noted. Psychiatric: Mood and affect are normal. Speech and behavior are normal.  ____________________________________________   LABS (all labs ordered are listed, but only abnormal results are displayed)  Labs Reviewed  COMPREHENSIVE METABOLIC PANEL - Abnormal; Notable for the following components:      Result Value   CO2 20 (*)    Glucose, Bld 106 (*)    All other components within normal limits  SALICYLATE LEVEL - Abnormal; Notable for the following components:   Salicylate Lvl <7.0 (*)    All other components within normal limits  ACETAMINOPHEN LEVEL - Abnormal; Notable for the following components:   Acetaminophen (Tylenol), Serum <10 (*)    All other components within normal limits  CBC - Abnormal; Notable for the following components:   WBC 17.1 (*)    All other components within normal limits  VALPROIC ACID LEVEL - Abnormal; Notable for the following components:   Valproic Acid Lvl <10 (*)    All other components within normal limits  RESP PANEL BY RT-PCR (FLU A&B, COVID) ARPGX2  ETHANOL  URINE DRUG SCREEN, QUALITATIVE ([facility] ONLY)  LIPID PANEL  HEMOGLOBIN A1C  PREGNANCY, URINE    PROCEDURES  Procedure(s) performed (including Critical Care):  Procedures   ____________________________________________   INITIAL IMPRESSION / ASSESSMENT AND PLAN / ED COURSE       35 year old female with past medical history of bipolar disorder, hyper lipidemia, and pseudoseizures who presents to the ED for suicidal ideation and thoughts of cutting herself for least the past week.  Patient complains of headache and nausea, did start vomiting during my evaluation.  We will treat with Zofran and Tylenol, chest for COVID-19.  If screening  labs are unremarkable, patient be appropriate for psychiatric evaluation.  She is calm and cooperative and we will hold off on IVC for now.  Screening labs are unremarkable and patient may be medically cleared.  She was evaluated by psychiatry who will plan for voluntary admission pending bed availability.  The patient has been placed in psychiatric observation due to the need to provide a safe environment for the patient while obtaining psychiatric consultation and evaluation, as well as ongoing medical and medication management to treat the patient's condition.  The patient has not been placed under full IVC at this time.       ____________________________________________   FINAL CLINICAL IMPRESSION(S) / ED DIAGNOSES  Final diagnoses:  Suicidal ideation     ED Discharge Orders    None       Note:  This document was prepared using Dragon voice recognition software and may include unintentional dictation errors.   Chesley Noon, MD 08/02/20 [phone]

## 2020-08-02 NOTE — BH Assessment (Signed)
Patient can come down at 9pm  Call to give report: [phone]  Patient is to be admitted to [facility] by Dr. Neale Burly.  Attending Physician will be. Dr. Neale Burly.   Patient has been assigned to room 322, by [facility] Charge Nurse Mickey Farber, RN.   Intake Paper Work has been signed and placed on patient chart.  ER staff is aware of the admission: 1. Misty Stanley, ER Secretary  2. Scotty Court, ER MD  3. Saige,Patient's Nurse  4. Anthony,Patient Access.

## 2020-08-02 NOTE — BH Assessment (Signed)
Comprehensive Clinical Assessment (CCA) Screening, Triage and Referral Note  08/02/2020 [name] [id number]  [name] is an 35 y.o female who presents to [facility] ED involuntarily for treatment. Per triage note, Pt comes with c/o SI. Pt states she has a lot going on and is just about to give up. Pt states trouble with her autistic son and school is threatening to take her to court. Pt tearful and states her mom doesn't support. Pt states her ex-husband took her knife away this am. Pt denies any drug or alcohol use. Pt denies any HI. Pt states she just doesn't know what to do and wants help.  During TTS assessment pt presents depressed, labile, tearful, anxious, and oriented x 5, restless but cooperative, and mood-congruent with affect. The pt does not appear to be responding to internal or external stimuli. Neither is the pt presenting with any delusional thinking. Pt was unable to verify the information provided to triage RN. Pt identified her main compliant to be increased depression and anxiety for over a month. Pt reports struggles with her autistic son who refuses to go to school, lack of support from school officials and family, potential court appearance for son's truancy and family conflict that is causing her to feel overwhelmed and stressed out. Pt also reports increased SI with no current plan but admits to intrusive thoughts to use a knife she located in her car. Pt denied using the knife due to her fianc stopping her.  Pt identified a lack of appetite, insomnia, guilt, SI, worrying and mood swings as her current symptoms.  Pt reports a hx of fleeting SI and one suicide attempt when she was 35 y.o but denies any attempts since then. MH reports an MH hx of depression, anxiety, and bipolar disorder and denies compliance with medications since August 2021 due to losing her insurance. Pt denies any current SA but reports a hx of drinking alcohol and smoking marijuana socially. Pt identified  her last time smoking marijuana to be September 2021 and her last drink of alcohol to be a few months ago. Pt reports and INPT hx with [facility] when she was 14 for a suicide attempt and an OPT hx but is currently unable to provide further information. Pt denies any current OPT services but expressed feeling it is needed. Pt denies a family hx of MH/SA and expressed the need to resume medication, therapy and OPT resources. Pt denies any current SI/HI/AH/VH but is also, receptive to INPT.   Per Dr. [name] Amend meets criteria for INPT  Chief Complaint:  Chief Complaint  Patient presents with  . SI  . Depression  . Anxiety   Visit Diagnosis: Per hx Bipolar II disorder  Patient Reported Information How did you hear about [place]? Self   Referral name: No data recorded  Referral phone number: No data recorded Whom do you see for routine medical problems? I don't have a doctor   Practice/Facility Name: No data recorded  Practice/Facility Phone Number: No data recorded  Name of Contact: No data recorded  Contact Number: No data recorded  Contact Fax Number: No data recorded  Prescriber Name: No data recorded  Prescriber Address (if known): No data recorded What Is the Reason for Your Visit/Call Today? Depression & Anxiety  How Long Has This Been Causing You Problems? > than 6 months  Have You Recently Been in Any Inpatient Treatment (Hospital/Detox/Crisis Center/28-Day Program)? No   Name/Location of Program/Hospital:No data recorded  How Long Were You  There? No data recorded  When Were You Discharged? No data recorded Have You Ever Received Services From [facility] Before? Yes   Who Do You See at [facility]? ED  Have You Recently Had Any Thoughts About Hurting Yourself? Yes   Are You Planning to Commit Suicide/Harm Yourself At This time?  No  Have you Recently Had Thoughts About Hurting Someone Karolee Ohs? No   Explanation: No data recorded Have You Used Any Alcohol or Drugs in the Past 24  Hours? No   How Long Ago Did You Use Drugs or Alcohol?  No data recorded  What Did You Use and How Much? No data recorded What Do You Feel Would Help You the Most Today? Therapy; Medication  Do You Currently Have a Therapist/Psychiatrist? No   Name of Therapist/Psychiatrist: No data recorded  Have You Been Recently Discharged From Any Office Practice or Programs? No   Explanation of Discharge From Practice/Program:  No data recorded    CCA Screening Triage Referral Assessment Type of Contact: Face-to-Face   Is this Initial or Reassessment? No data recorded  Date Telepsych consult ordered in [facility]:  No data recorded  Time Telepsych consult ordered in [facility]:  No data recorded Patient Reported Information Reviewed? Yes   Patient Left Without Being Seen? No data recorded  Reason for Not Completing Assessment: No data recorded Collateral Involvement: Leighton Roach- [facility] [phone]  Does Patient Have a Court Appointed Legal Guardian? No data recorded  Name and Contact of Legal Guardian:  No data recorded If Minor and Not Living with Parent(s), Who has Custody? n/a  Is CPS involved or ever been involved? Never  Is APS involved or ever been involved? Never  Patient Determined To Be At Risk for Harm To Self or Others Based on Review of Patient Reported Information or Presenting Complaint? No   Method: No data recorded  Availability of Means: No data recorded  Intent: No data recorded  Notification Required: No data recorded  Additional Information for Danger to Others Potential:  No data recorded  Additional Comments for Danger to Others Potential:  No data recorded  Are There Guns or Other Weapons in Your Home?  No data recorded   Types of Guns/Weapons: No data recorded   Are These Weapons Safely Secured?                              No data recorded   Who Could Verify You Are Able To Have These Secured:    No data recorded Do You Have any Outstanding Charges, Pending Court  Dates, Parole/Probation? No data recorded Contacted To Inform of Risk of Harm To Self or Others: No data recorded Location of Assessment: [facility] ED  Does Patient Present under Involuntary Commitment? No   IVC Papers Initial File Date: No data recorded  [place] of Residence: [place]  Patient Currently Receiving the Following Services: Not Receiving Services   Determination of Need: Emergent (2 hours)   Options For Referral: Inpatient Hospitalization; Medication Management; Outpatient Therapy   Opal Sidles, LCSWA

## 2020-08-02 NOTE — ED Notes (Signed)
Pt dressed out into hospital scrubs. Pt's belongings to include: 2 black slides 1 colored shirt 1 pink skull pants 1 blue sweatshirt 1 white panties 1 white sunglasses 2 white socks 1 pink shirt 1 bag full of change of clothes 1 black phone 1 hair tie

## 2020-08-02 NOTE — ED Notes (Signed)
Pt given saltine crackers, peanut butter and ginger ale.
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
## 2020-08-03 ENCOUNTER — Other Ambulatory Visit (HOSPITAL_COMMUNITY): Payer: Self-pay | Admitting: Behavioral Health

## 2020-08-03 ENCOUNTER — Encounter: Payer: Self-pay | Admitting: Psychiatry

## 2020-08-03 DIAGNOSIS — F3181 Bipolar II disorder: Principal | ICD-10-CM

## 2020-08-03 DIAGNOSIS — F411 Generalized anxiety disorder: Secondary | ICD-10-CM | POA: Diagnosis present

## 2020-08-03 DIAGNOSIS — F5105 Insomnia due to other mental disorder: Secondary | ICD-10-CM | POA: Diagnosis present

## 2020-08-03 LAB — PREGNANCY, URINE: Preg Test, Ur: NEGATIVE

## 2020-08-03 MED ORDER — ACETAMINOPHEN 325 MG PO TABS: 650.0000 mg | ORAL_TABLET | Freq: Four times a day (QID) | ORAL | Status: DC | PRN

## 2020-08-03 MED ORDER — ALUM & MAG HYDROXIDE-SIMETH 200-200-20 MG/5ML PO SUSP: 30.0000 mL | ORAL | Status: DC | PRN

## 2020-08-03 MED ORDER — ARIPIPRAZOLE 5 MG PO TABS: 5.0000 mg | ORAL_TABLET | Freq: Every day | ORAL | 0 refills | Status: DC

## 2020-08-03 MED ORDER — ARIPIPRAZOLE 5 MG PO TABS
5.0000 mg | ORAL_TABLET | Freq: Every day | ORAL | Status: DC
  Administered 2020-08-03 – 2020-08-04 (×2): 5 mg via ORAL
  Filled 2020-08-03 (×2): qty 1

## 2020-08-03 MED ORDER — FLUOXETINE HCL 40 MG PO CAPS: 40.0000 mg | ORAL_CAPSULE | Freq: Every day | ORAL | 0 refills | Status: DC

## 2020-08-03 MED ORDER — HYDROXYZINE HCL 50 MG PO TABS
50.0000 mg | ORAL_TABLET | Freq: Four times a day (QID) | ORAL | Status: DC | PRN
  Administered 2020-08-03 – 2020-08-04 (×2): 50 mg via ORAL
  Filled 2020-08-03 (×2): qty 1

## 2020-08-03 MED ORDER — TRAZODONE HCL 100 MG PO TABS
100.0000 mg | ORAL_TABLET | Freq: Every day | ORAL | Status: DC
  Administered 2020-08-03: 100 mg via ORAL
  Filled 2020-08-03: qty 1

## 2020-08-03 MED ORDER — NICOTINE 21 MG/24HR TD PT24
21.0000 mg | MEDICATED_PATCH | Freq: Every day | TRANSDERMAL | Status: DC
  Administered 2020-08-03 – 2020-08-04 (×2): 21 mg via TRANSDERMAL
  Filled 2020-08-03 (×2): qty 1

## 2020-08-03 MED ORDER — MAGNESIUM HYDROXIDE 400 MG/5ML PO SUSP: 30.0000 mL | Freq: Every day | ORAL | Status: DC | PRN

## 2020-08-03 MED ORDER — DIVALPROEX SODIUM 500 MG PO DR TAB
500.0000 mg | DELAYED_RELEASE_TABLET | Freq: Every day | ORAL | Status: DC
  Administered 2020-08-03: 500 mg via ORAL
  Filled 2020-08-03: qty 1

## 2020-08-03 MED ORDER — TRAZODONE HCL 100 MG PO TABS: 100.0000 mg | ORAL_TABLET | Freq: Every evening | ORAL | Status: DC | PRN

## 2020-08-03 MED ORDER — DIVALPROEX SODIUM ER 500 MG PO TB24: 500.0000 mg | ORAL_TABLET | Freq: Every day | ORAL | 0 refills | Status: DC

## 2020-08-03 MED ORDER — FLUOXETINE HCL 20 MG PO CAPS
40.0000 mg | ORAL_CAPSULE | Freq: Every day | ORAL | Status: DC
  Administered 2020-08-03 – 2020-08-04 (×2): 40 mg via ORAL
  Filled 2020-08-03 (×2): qty 2

## 2020-08-03 MED ORDER — TRAZODONE HCL 100 MG PO TABS: 100.0000 mg | ORAL_TABLET | Freq: Every evening | ORAL | 0 refills | Status: DC | PRN

## 2020-08-03 NOTE — [facility] Suicide Risk Assessment (Signed)
[facility] INPATIENT:  Family/Significant Other Suicide Prevention Education  Suicide Prevention Education:  Education Completed; Molli Hazard Miller/fiance [phone]), has been identified by the patient as the family member/significant other with whom the patient will be residing, and identified as the person(s) who will aid the patient in the event of a mental health crisis (suicidal ideations/suicide attempt).  With written consent from the patient, the family member/significant other has been provided the following suicide prevention education, prior to the and/or following the discharge of the patient.  The suicide prevention education provided includes the following:  Suicide risk factors  Suicide prevention and interventions  National Suicide Hotline telephone number  [facility] assessment telephone number  [facility] Emergency Assistance 911  [facility] and/or Residential Mobile Crisis Unit telephone number  Request made of family/significant other to:  Remove weapons (e.g., guns, rifles, knives), all items previously/currently identified as safety concern.    Remove drugs/medications (over-the-counter, prescriptions, illicit drugs), all items previously/currently identified as a safety concern.  The family member/significant other verbalizes understanding of the suicide prevention education information provided.  The family member/significant other agrees to remove the items of safety concern listed above.  Hyacinth Meeker stated that pt has been having issues with her autistic son who refuses to attend school. He describes son as being disobedient specifically around going to school. Hyacinth Meeker shares that the school is threatening to take her to court. He goes on to state that pt just broke down finally. Fiance does not feel that pt is a danger to herself or anyone else. He shared that he felt what she did the other day was a reaction to not knowing how to go about getting help  and really feeling that she needed it.    Glenis Smoker 08/03/2020, 2:56 PM

## 2020-08-03 NOTE — Progress Notes (Signed)
Recreation Therapy Notes   Date: 08/03/2020  Time: 9:30 am   Location: Craft room   Behavioral response: Appropriate  Intervention Topic: Leisure   Discussion/Intervention:  Group content today was focused on leisure. The group defined what leisure is and some positive leisure activities they participate in. Individuals identified the difference between good and bad leisure. Participants expressed how they feel after participating in the leisure of their choice. The group discussed how they go about picking a leisure activity and if others are involved in their leisure activities. The patient stated how many leisure activities they have to choose from and reasons why it is important to have leisure time. Individuals participated in the intervention "Exploration of Leisure" where they had a chance to identify new leisure activities as well as benefits of leisure. Clinical Observations/Feedback: Patient came to group and explained that during her leisure time she enjoys napping. She stated that leisure is important to make you own choices. Participant stated that she could improve her leisure by making time and setting alarms.Individual was social with peers and staff while participating in the intervention.  Jonpaul Lumm LRT/CTRS         Carely Nappier 08/03/2020 12:07 PM

## 2020-08-03 NOTE — H&P (Addendum)
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 2/9/20223:55 PM

## 2020-08-03 NOTE — Progress Notes (Signed)
Recreation Therapy Notes  INPATIENT RECREATION TR PLAN  Patient Details Name: [name] MRN: [id number] DOB: [birth year] Today's Date: 08/03/2020  Rec Therapy Plan Is patient appropriate for Therapeutic Recreation?: Yes Treatment times per week: at least 3 Estimated Length of Stay: 5-7 days TR Treatment/Interventions: Group participation (Comment)  Discharge Criteria Pt will be discharged from therapy if:: Discharged Treatment plan/goals/alternatives discussed and agreed upon by:: Patient/family  Discharge Summary     Undrea Archbold 08/03/2020, 4:23 PM

## 2020-08-03 NOTE — Tx Team (Addendum)
Interdisciplinary Treatment and Diagnostic Plan Update  08/03/2020 Time of Session: 9:00AM [name] MRN: [id number]  Principal Diagnosis: <principal problem not specified>  Secondary Diagnoses: Active Problems:   Bipolar 2 disorder (HCC)   Current Medications:  Current Facility-Administered Medications  Medication Dose Route Frequency Provider Last Rate Last Admin  . acetaminophen (TYLENOL) tablet 650 mg  650 mg Oral Q6H PRN Clapacs, John T, MD      . alum & mag hydroxide-simeth (MAALOX/MYLANTA) 200-200-20 MG/5ML suspension 30 mL  30 mL Oral Q4H PRN Clapacs, John T, MD      . ARIPiprazole (ABILIFY) tablet 5 mg  5 mg Oral Daily Clapacs, Madie Reno, MD   5 mg at 08/03/20 4098  . divalproex (DEPAKOTE) DR tablet 500 mg  500 mg Oral QHS Clapacs, John T, MD      . FLUoxetine (PROZAC) capsule 40 mg  40 mg Oral Daily Clapacs, John T, MD   40 mg at 08/03/20 1191  . hydrOXYzine (ATARAX/VISTARIL) tablet 50 mg  50 mg Oral Q6H PRN Clapacs, Madie Reno, MD   50 mg at 08/03/20 4782  . magnesium hydroxide (MILK OF MAGNESIA) suspension 30 mL  30 mL Oral Daily PRN Clapacs, John T, MD      . nicotine (NICODERM CQ - dosed in mg/24 hours) patch 21 mg  21 mg Transdermal Daily Salley Scarlet, MD      . traZODone (DESYREL) tablet 100 mg  100 mg Oral QHS PRN Clapacs, Madie Reno, MD       PTA Medications: Medications Prior to Admission  Medication Sig Dispense Refill Last Dose  . albuterol (VENTOLIN HFA) 108 (90 Base) MCG/ACT inhaler Inhale 2 puffs into the lungs every 4 (four) hours as needed for wheezing or shortness of breath. 18 g 0   . amoxicillin (AMOXIL) 875 MG tablet Take 1 tablet (875 mg total) by mouth 2 (two) times daily. (Patient not taking: No sig reported) 20 tablet 0   . ARIPiprazole (ABILIFY) 5 MG tablet Take 1 tablet (5 mg total) by mouth daily. 30 tablet 1   . benzonatate (TESSALON) 200 MG capsule Take 1 capsule (200 mg total) by mouth 3 (three) times daily as needed for cough. (Patient not taking:  Reported on 08/02/2020) 100 capsule 0   . divalproex (DEPAKOTE ER) 500 MG 24 hr tablet Take 1 tablet (500 mg total) by mouth at bedtime. 90 tablet 0   . fluconazole (DIFLUCAN) 150 MG tablet Take one now and one in a week (Patient not taking: No sig reported) 2 tablet 0   . FLUoxetine (PROZAC) 40 MG capsule Take 1 capsule (40 mg total) by mouth daily. 90 capsule 0   . fluticasone (FLONASE) 50 MCG/ACT nasal spray Place 1 spray into both nostrils 2 (two) times daily. (Patient not taking: Reported on 08/02/2020) 16 g 0   . Fluticasone-Umeclidin-Vilant (TRELEGY ELLIPTA) 200-62.5-25 MCG/INH AEPB Inhale 1 puff into the lungs daily. 28 each 11   . ibuprofen (ADVIL) 600 MG tablet Take 1 tablet (600 mg total) by mouth every 6 (six) hours as needed. 30 tablet 0   . metroNIDAZOLE (FLAGYL) 500 MG tablet Take 1 tablet (500 mg total) by mouth 2 (two) times daily. (Patient not taking: No sig reported) 14 tablet 0   . Multiple Vitamins-Minerals (MULTIVITAMIN WITH MINERALS) tablet Take 1 tablet by mouth daily. (Patient not taking: Reported on 08/02/2020)     . omeprazole (PRILOSEC) 20 MG capsule Take 1 capsule (20 mg total) by mouth  2 (two) times daily. 60 capsule 2   . Spacer/Aero-Holding Chambers (AEROCHAMBER PLUS) inhaler Use as instructed 1 each 2   . traZODone (DESYREL) 100 MG tablet Take 1 tablet (100 mg total) by mouth at bedtime as needed for sleep. 90 tablet 0   . vitamin B-12 (CYANOCOBALAMIN) 1000 MCG tablet Take 1,000 mcg by mouth daily. (Patient not taking: Reported on 08/02/2020)       Patient Stressors: Financial difficulties Marital or family conflict  Patient Strengths: Average or above average intelligence Supportive family/friends  Treatment Modalities: Medication Management, Group therapy, Case management,  1 to 1 session with clinician, Psychoeducation, Recreational therapy.   Physician Treatment Plan for Primary Diagnosis: <principal problem not specified> Long Term Goal(s):     Short Term  Goals:    Medication Management: Evaluate patient's response, side effects, and tolerance of medication regimen.  Therapeutic Interventions: 1 to 1 sessions, Unit Group sessions and Medication administration.  Evaluation of Outcomes: Not Met  Physician Treatment Plan for Secondary Diagnosis: Active Problems:   Bipolar 2 disorder ([place])  Long Term Goal(s):     Short Term Goals:       Medication Management: Evaluate patient's response, side effects, and tolerance of medication regimen.  Therapeutic Interventions: 1 to 1 sessions, Unit Group sessions and Medication administration.  Evaluation of Outcomes: Not Met   RN Treatment Plan for Primary Diagnosis: <principal problem not specified> Long Term Goal(s): Knowledge of disease and therapeutic regimen to maintain health will improve  Short Term Goals: Ability to demonstrate self-control, Ability to participate in decision making will improve, Ability to verbalize feelings will improve, Ability to disclose and discuss suicidal ideas and Ability to identify and develop effective coping behaviors will improve  Medication Management: RN will administer medications as ordered by provider, will assess and evaluate patient's response and provide education to patient for prescribed medication. RN will report any adverse and/or side effects to prescribing provider.  Therapeutic Interventions: 1 on 1 counseling sessions, Psychoeducation, Medication administration, Evaluate responses to treatment, Monitor vital signs and CBGs as ordered, Perform/monitor CIWA, COWS, AIMS and Fall Risk screenings as ordered, Perform wound care treatments as ordered.  Evaluation of Outcomes: Not Met   LCSW Treatment Plan for Primary Diagnosis: <principal problem not specified> Long Term Goal(s): Safe transition to appropriate next level of care at discharge, Engage patient in therapeutic group addressing interpersonal concerns.  Short Term Goals: Engage patient in  aftercare planning with referrals and resources, Increase social support, Increase ability to appropriately verbalize feelings, Increase emotional regulation, Facilitate acceptance of mental health diagnosis and concerns and Increase skills for wellness and recovery  Therapeutic Interventions: Assess for all discharge needs, 1 to 1 time with Social worker, Explore available resources and support systems, Assess for adequacy in community support network, Educate family and significant other(s) on suicide prevention, Complete Psychosocial Assessment, Interpersonal group therapy.  Evaluation of Outcomes: Not Met   Progress in Treatment: Attending groups: No. Participating in groups: No. Taking medication as prescribed: Yes. Toleration medication: Yes. Family/Significant other contact made: No, will contact:  once permission has been given. Patient understands diagnosis: Yes. Discussing patient identified problems/goals with staff: Yes. Medical problems stabilized or resolved: Yes. Denies suicidal/homicidal ideation: No. Issues/concerns per patient self-inventory: No. Other: none  New problem(s) identified: No, Describe:  none  New Short Term/Long Term Goal(s): medication management for mood stabilization; elimination of SI thoughts; development of comprehensive mental wellness/sobriety plan.  Patient Goals:  "getting my medication back and my anxiety"  Discharge Plan or Barriers: CSW will assist the patient in developing appropriate discharge plans.  Reason for Continuation of Hospitalization: Anxiety Depression Medical Issues Medication stabilization Suicidal ideation  Estimated Length of Stay:  1-7 days  Recreational Therapy: Patient Stressors: N/A Patient Goal: Patient will engage in groups without prompting or encouragement from LRT x3 group sessions within 5 recreation therapy group sessions.  Attendees: Patient: [name] 08/03/2020 9:49 AM  Physician: Dr.  Domingo Cocking, MD 08/03/2020 9:49 AM  Nursing: West Pugh, RN 08/03/2020 9:49 AM  RN Care Manager: 08/03/2020 9:49 AM  Social Worker: Assunta Curtis, MSW, LCSW 08/03/2020 9:49 AM  Recreational Therapist: Devin Going, LRT 08/03/2020 9:49 AM  Other: Michell Heinrich, LCSW 08/03/2020 9:49 AM  Other: Kiva Martin[place], MSW, [place] 08/03/2020 9:49 AM  Other: 08/03/2020 9:49 AM    Scribe for Treatment Team: Rozann Lesches, LCSW 08/03/2020 9:49 AM

## 2020-08-03 NOTE — BHH Suicide Risk Assessment (Signed)
[facility] Admission Suicide Risk Assessment   Nursing information obtained from:    Demographic factors:  Low socioeconomic status,Caucasian Current Mental Status:  Suicidal ideation indicated by patient Loss Factors:  Financial problems / change in socioeconomic status Historical Factors:  NA Risk Reduction Factors:  Responsible for children under 36 years of age,Sense of responsibility to family,Living with another person, especially a relative  Total Time spent with patient: 1 hour Principal Problem: Bipolar 2 disorder (HCC) Diagnosis:  Principal Problem:   Bipolar 2 disorder (HCC) Active Problems:   Generalized anxiety disorder   Insomnia due to mental condition  Subjective Data: Patient is a 35 year old female with bipolar II disorder, anxiety, and insomnia presenting for worsening mood in context of being off home medications since August. She is recently unemployed, and lost her insurance and has been unable to follow-up with the doctors. She feels she has become increasingly depressed and anxious to point it is hard to get out of bed some days. She notes that she got to the point she took a knife out at home and thought about stabbing or cutting herself, but decided to seek help instead. Her biggest life stressor currently is her 42 year old son refusing to go to school, and the school threatening to arrest her. She notes that her son is 5'9'' and weighs 217 pounds and is physically too imposing to force to get into car and go to school. She worries about calling the police to escort him due to past trauma. She notes that she does not feel her family is supporting her in the effort to get him to go to school. She noted that she is typically able to handle these life stressors more on her medications. Her goals are to restart them, and follow-up with [facility]. She denies suicidal ideations, homicidal ideations, visual hallucinations, and auditory hallucinations.   Continued Clinical Symptoms:     The "Alcohol Use Disorders Identification Test", Guidelines for Use in Primary Care, Second Edition.  World Science writer [facility]). Score between 0-7:  no or low risk or alcohol related problems. Score between 8-15:  moderate risk of alcohol related problems. Score between 16-19:  high risk of alcohol related problems. Score 20 or above:  warrants further diagnostic evaluation for alcohol dependence and treatment.   CLINICAL FACTORS:   Severe Anxiety and/or Agitation Bipolar Disorder:   Depressive phase More than one psychiatric diagnosis Unstable or Poor Therapeutic Relationship Previous Psychiatric Diagnoses and Treatments   Musculoskeletal: Strength & Muscle Tone: within normal limits Gait & Station: normal Patient leans: N/A  Psychiatric Specialty Exam: Physical Exam Vitals and nursing note reviewed.  Constitutional:      Appearance: Normal appearance.  HENT:     Head: Normocephalic and atraumatic.     Right Ear: External ear normal.     Left Ear: External ear normal.     Nose: Nose normal.     Mouth/Throat:     Mouth: Mucous membranes are moist.     Pharynx: Oropharynx is clear.  Eyes:     Extraocular Movements: Extraocular movements intact.     Conjunctiva/sclera: Conjunctivae normal.     Pupils: Pupils are equal, round, and reactive to light.  Cardiovascular:     Rate and Rhythm: Normal rate.     Pulses: Normal pulses.  Pulmonary:     Effort: Pulmonary effort is normal.     Breath sounds: Normal breath sounds.  Abdominal:     General: Abdomen is flat.     Palpations:  Abdomen is soft.  Musculoskeletal:        General: No swelling. Normal range of motion.     Cervical back: Normal range of motion and neck supple.  Skin:    General: Skin is warm and dry.  Neurological:     General: No focal deficit present.     Mental Status: She is alert and oriented to person, place, and time.  Psychiatric:        Attention and Perception: Attention and perception  normal.        Mood and Affect: Mood is anxious and depressed. Affect is tearful.        Speech: Speech normal.        Behavior: Behavior is cooperative.        Thought Content: Thought content does not include homicidal or suicidal ideation.        Cognition and Memory: Cognition and memory normal.        Judgment: Judgment normal.     Review of Systems  Constitutional: Positive for fatigue. Negative for appetite change.  HENT: Negative for rhinorrhea and sore throat.   Eyes: Negative for photophobia and visual disturbance.  Respiratory: Negative for cough and shortness of breath.   Cardiovascular: Negative for chest pain and palpitations.  Gastrointestinal: Negative for constipation, diarrhea, nausea and vomiting.  Endocrine: Negative for cold intolerance and heat intolerance.  Genitourinary: Negative for difficulty urinating and dysuria.  Musculoskeletal: Negative for arthralgias and myalgias.  Skin: Negative for rash and wound.  Allergic/Immunologic: Positive for environmental allergies and food allergies.  Neurological: Negative for dizziness and headaches.  Hematological: Negative for adenopathy. Does not bruise/bleed easily.  Psychiatric/Behavioral: Positive for dysphoric mood and sleep disturbance. Negative for suicidal ideas. The patient is nervous/anxious.     Blood pressure 115/81, pulse 99, temperature 98.2 F (36.8 C), temperature source Oral, resp. rate 18, height 5\' 5"  (1.651 m), weight 80.7 kg, last menstrual period 11/09/2019, SpO2 98 %.Body mass index is 29.62 kg/m.  General Appearance: Casual  Eye Contact:  Good  Speech:  Clear and Coherent  Volume:  Normal  Mood:  Anxious and Depressed  Affect:  Congruent and Tearful  Thought Process:  Coherent and Linear  Orientation:  Full (Time, Place, and Person)  Thought Content:  Logical  Suicidal Thoughts:  No  Homicidal Thoughts:  No  Memory:  Immediate;   Fair Recent;   Fair Remote;   Fair  Judgement:  Intact   Insight:  Present  Psychomotor Activity:  Normal  Concentration:  Concentration: Fair  Recall:  11/11/2019 of Knowledge:  Fair  Language:  Fair  Akathisia:  Negative  Handed:  Right  AIMS (if indicated):     Assets:  Communication Skills Desire for Improvement Housing Intimacy Physical Health Resilience Social Support  ADL's:  Intact  Cognition:  WNL  Sleep:            COGNITIVE FEATURES THAT CONTRIBUTE TO RISK:  Polarized thinking    SUICIDE RISK:   Mild:  Suicidal ideation of limited frequency, intensity, duration, and specificity.  There are no identifiable plans, no associated intent, mild dysphoria and related symptoms, good self-control (both objective and subjective assessment), few other risk factors, and identifiable protective factors, including available and accessible social support.  PLAN OF CARE: Continue voluntary admission. See H&P for full details.   I certify that inpatient services furnished can reasonably be expected to improve the patient's condition.   [employer], MD 08/03/2020, 4:10  PM

## 2020-08-03 NOTE — Progress Notes (Signed)
Patient calm and cooperative during assessment endorsing depression. Patient observed interacting appropriately with staff and peers on the unit. Patient given education, support, and encouragement to be active in her treatment plan. Patient compliant with medication administration per MD orders. Pt denies SI/HI/AVH. Pt being monitored Q 15 minutes for safety per unit protocol. Pt remains safe on the unit.

## 2020-08-03 NOTE — Plan of Care (Signed)
Patient needs stabilization of psychiatric symptoms and medication management.

## 2020-08-03 NOTE — Tx Team (Signed)
Initial Treatment Plan 08/03/2020 4:50 AM Niel Hummer QAS:[id number]    PATIENT STRESSORS: Financial difficulties Marital or family conflict   PATIENT STRENGTHS: Average or above average intelligence Supportive family/friends   PATIENT IDENTIFIED PROBLEMS: Depression  Medication non compliance                   DISCHARGE CRITERIA:  Improved stabilization in mood, thinking, and/or behavior Verbal commitment to aftercare and medication compliance  PRELIMINARY DISCHARGE PLAN: Return to previous living arrangement  PATIENT/FAMILY INVOLVEMENT: This treatment plan has been presented to and reviewed with the patient, [name], and/or family member.  The patient and family have been given the opportunity to ask questions and make suggestions.  Elbert Ewings, RN 08/03/2020, 4:50 AM

## 2020-08-03 NOTE — BHH Group Notes (Signed)
  LCSW Group Therapy Note     08/03/2020 2:24 PM     Type of Therapy/Topic:  Group Therapy:  Emotion Regulation     Participation Level:  Active     Description of Group:   The purpose of this group is to assist patients in learning to regulate negative emotions and experience positive emotions. Patients will be guided to discuss ways in which they have been vulnerable to their negative emotions. These vulnerabilities will be juxtaposed with experiences of positive emotions or situations, and patients will be challenged to use positive emotions to combat negative ones. Special emphasis will be placed on coping with negative emotions in conflict situations, and patients will process healthy conflict resolution skills.     Therapeutic Goals:  1.    Patient will identify two positive emotions or experiences to reflect on in order to balance out negative emotions  2.    Patient will label two or more emotions that they find the most difficult to experience  3.    Patient will demonstrate positive conflict resolution skills through discussion and/or role plays     Summary of Patient Progress:  Pt stated that she wants to find that person that she can open up to and trust and who will listen to her vent. Pt said that she "bottles up her emotions and then explodes" which makes her situations a lot worse. She stated that she has not yet found out how to be positive/act positively when negative emotions arise because so many people around her are negative. Pt said it was nice to feel some mutual understanding from the other participants in group.    Therapeutic Modalities:   Cognitive Behavioral Therapy  Feelings Identification  Dialectical Behavioral Therapy   Ilma Achee [place], MSW, LCSW-A  08/03/2020 2:24 PM

## 2020-08-03 NOTE — Progress Notes (Signed)
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[employer])  Leisure Interests (2+):  Art - Coloring,Crafts - Painting,Social - Family  Frequency of Recreation/Participation: Monthly  Awareness of Community Resources:  Yes  Community Resources:  Church  Current Use: No  If no, Barriers?: Attitudinal  Expressed Interest in [employer] Information: No  [place]idence[place]  Patient Main Form of Transportation: Set designer  Patient Strengths:  Keep peace  Patient Identified Areas of Improvement:  Talk about my problems  Patient Goal for Hospitalization:  Get back on medication and stay on it  Current SI (including self-harm):  No  Current HI:  No  Current AVH: No  Staff Intervention Plan: Group Attendance,Collaborate with Interdisciplinary Treatment Team  Consent to Intern Participation: N/A  [name] 08/03/2020, 4:20 PM
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
## 2020-08-03 NOTE — Progress Notes (Signed)
Patient admitted to [facility] after having suicidal thoughts with being overwhelmed due to stress. Her stressors she reports are taking care of her autistic son, not working and being off psych medications. She is Ox4, cooperative , sad but she denies SI at this time , she does endorse depression but she denies AVH. She was searched on admission by two staff no contraband found.

## 2020-08-03 NOTE — Plan of Care (Signed)
Patient compliant with procedures on the unit and medication administration per MD orders  Problem: Health Behavior/Discharge Planning: Goal: Compliance with therapeutic regimen will improve Outcome: Progressing

## 2020-08-03 NOTE — Plan of Care (Signed)
Patient appears sad and tearful this morning states " I am very emotional."  Patient states " medication management " is her goal for today. Patient rated her anxiety 10/10.Vistaril given with good result. Patient refused chaplin consult for emotional support. Denies SI,HI and AVH.Visible in the milieu.Appropriate with staff & peers.Attended groups.Appetite and energy level good.Support and encouragement given.

## 2020-08-04 ENCOUNTER — Other Ambulatory Visit: Payer: Self-pay | Admitting: Behavioral Health

## 2020-08-04 MED ORDER — HYDROXYZINE HCL 50 MG PO TABS: 50.0000 mg | ORAL_TABLET | Freq: Three times a day (TID) | ORAL | 1 refills | Status: DC | PRN

## 2020-08-04 MED ORDER — ARIPIPRAZOLE 5 MG PO TABS: 5.0000 mg | ORAL_TABLET | Freq: Every day | ORAL | 1 refills | Status: DC

## 2020-08-04 MED ORDER — FLUOXETINE HCL 40 MG PO CAPS: 40.0000 mg | ORAL_CAPSULE | Freq: Every day | ORAL | 1 refills | Status: DC

## 2020-08-04 MED ORDER — TRAZODONE HCL 100 MG PO TABS: 100.0000 mg | ORAL_TABLET | Freq: Every evening | ORAL | 1 refills | Status: DC | PRN

## 2020-08-04 MED ORDER — DIVALPROEX SODIUM ER 500 MG PO TB24: 500.0000 mg | ORAL_TABLET | Freq: Every day | ORAL | 1 refills | Status: DC

## 2020-08-04 NOTE — Progress Notes (Signed)
Recreation Therapy Notes  INPATIENT RECREATION TR PLAN  Patient Details Name: [name] MRN: [id number] DOB: 06-Dec-1985 Today's Date: 08/04/2020  Rec Therapy Plan Is patient appropriate for Therapeutic Recreation?: Yes Treatment times per week: at least 3 Estimated Length of Stay: 5-7 days TR Treatment/Interventions: Group participation (Comment)  Discharge Criteria Pt will be discharged from therapy if:: Discharged Treatment plan/goals/alternatives discussed and agreed upon by:: Patient/family  Discharge Summary Short term goals set: Patient will identify 3 benefits of managing their anxiety in a healthy manner within 5 recreation therapy group sessions Short term goals met: Adequate for discharge Progress toward goals comments: Groups attended Which groups?: Coping skills,Leisure education Reason goals not met: N/A Therapeutic equipment acquired: N/A Reason patient discharged from therapy: Discharge from hospital Pt/family agrees with progress & goals achieved: Yes Date patient discharged from therapy: 08/04/20   Ariele Vidrio 08/04/2020, 4:03 PM

## 2020-08-04 NOTE — Progress Notes (Signed)
Recreation Therapy Notes  Date: 08/04/2020  Time: 9:30 am   Location: Craft room   Behavioral response: Appropriate  Intervention Topic: Coping-skills    Discussion/Intervention:  Group content on today was focused on coping skills. The group defined what coping skills are and when they normally use coping skills. Individuals described how they normally cope with thing and the coping skills they normally use. Patients expressed why it is important to cope with things and how not coping with things can affect you. The group participated in the intervention "My coping box" and made coping boxes while adding coping skills they could use in the future to the box. Clinical Observations/Feedback: Patient came to group late due to unknown reasons. She stated that coping-skills are important so you do not end up in the hospital.  Individual was social with peers and staff while participating in the intervention.  Xiara Knisley LRT/CTRS            Mykelti Goldenstein 08/04/2020 12:13 PM

## 2020-08-04 NOTE — Plan of Care (Signed)
Pt rates depression and anxiety 2/10. Pt denies SI, HI and AVH. Pt was educated on dc plan and verbalizes understanding. Torrie Mayers RN Problem: Education: Goal: Utilization of techniques to improve thought processes will improve Outcome: Adequate for Discharge Goal: Knowledge of the prescribed therapeutic regimen will improve Outcome: Adequate for Discharge   Problem: Activity: Goal: Interest or engagement in leisure activities will improve Outcome: Adequate for Discharge Goal: Imbalance in normal sleep/wake cycle will improve Outcome: Adequate for Discharge   Problem: Coping: Goal: Coping ability will improve Outcome: Adequate for Discharge Goal: Will verbalize feelings Outcome: Adequate for Discharge   Problem: Health Behavior/Discharge Planning: Goal: Ability to make decisions will improve Outcome: Adequate for Discharge Goal: Compliance with therapeutic regimen will improve Outcome: Adequate for Discharge   Problem: Role Relationship: Goal: Will demonstrate positive changes in social behaviors and relationships Outcome: Adequate for Discharge   Problem: Safety: Goal: Ability to disclose and discuss suicidal ideas will improve Outcome: Adequate for Discharge Goal: Ability to identify and utilize support systems that promote safety will improve Outcome: Adequate for Discharge   Problem: Self-Concept: Goal: Will verbalize positive feelings about self Outcome: Adequate for Discharge Goal: Level of anxiety will decrease Outcome: Adequate for Discharge

## 2020-08-04 NOTE — [facility] Suicide Risk Assessment (Signed)
[facility] Discharge Suicide Risk Assessment   Principal Problem: Bipolar 2 disorder [facility]) Discharge Diagnoses: Principal Problem:   Bipolar 2 disorder (HCC) Active Problems:   Generalized anxiety disorder   Insomnia due to mental condition   Total Time spent with patient: 35 minutes- 25 minutes direct patient care, 10 minutes coordination of discharge planning and documentation  Musculoskeletal: Strength & Muscle Tone: within normal limits Gait & Station: normal Patient leans: N/A  Psychiatric Specialty Exam: Review of Systems  Constitutional: Negative for appetite change and fatigue.  HENT: Negative for rhinorrhea and sore throat.   Eyes: Negative for photophobia and visual disturbance.  Respiratory: Negative for cough and shortness of breath.   Cardiovascular: Negative for chest pain and palpitations.  Gastrointestinal: Negative for constipation, diarrhea, nausea and vomiting.  Endocrine: Negative for cold intolerance and heat intolerance.  Genitourinary: Negative for difficulty urinating and dysuria.  Musculoskeletal: Negative for arthralgias and myalgias.  Skin: Negative for rash and wound.  Allergic/Immunologic: Positive for environmental allergies and food allergies.  Neurological: Negative for dizziness and headaches.  Hematological: Negative for adenopathy. Does not bruise/bleed easily.  Psychiatric/Behavioral: Negative for behavioral problems, hallucinations and suicidal ideas. The patient is not nervous/anxious.     Blood pressure 110/73, pulse (!) 127, temperature 97.8 F (36.6 C), temperature source Oral, resp. rate 18, height 5\' 5"  (1.651 m), weight 80.7 kg, last menstrual period 11/09/2019, SpO2 95 %.Body mass index is 29.62 kg/m.  General Appearance: Casual and Fairly Groomed  11/11/2019::  Good  Speech:  Clear and Coherent and Normal Rate  Volume:  Normal  Mood:  Euthymic  Affect:  Congruent  Thought Process:  Coherent and Linear  Orientation:  Full (Time,  Place, and Person)  Thought Content:  Logical  Suicidal Thoughts:  No  Homicidal Thoughts:  No  Memory:  Immediate;   Fair Recent;   Fair Remote;   Fair  Judgement:  Fair  Insight:  Fair  Psychomotor Activity:  Normal  Concentration:  Fair  Recall:  [id number] of Knowledge:Fair  Language: Fair  Akathisia:  Negative  Handed:  Right  AIMS (if indicated):     Assets:  Communication Skills Desire for Improvement Housing Intimacy Physical Health Resilience Social Support  Sleep:  Number of Hours: 8  Cognition: WNL  ADL's:  Intact   Mental Status Per Nursing Assessment::   On Admission:  Suicidal ideation indicated by patient  Demographic Factors:  Caucasian  Loss Factors: NA  Historical Factors: Prior suicide attempts  Risk Reduction Factors:   Responsible for children under 30 years of age, Sense of responsibility to family, Religious beliefs about death, Living with another person, especially a relative, Positive social support, Positive therapeutic relationship and Positive coping skills or problem solving skills  Continued Clinical Symptoms:  Severe Anxiety and/or Agitation Bipolar Disorder:   Depressive phase Previous Psychiatric Diagnoses and Treatments  Cognitive Features That Contribute To Risk:  None    Suicide Risk:  Mild:  Suicidal ideation of limited frequency, intensity, duration, and specificity.  There are no identifiable plans, no associated intent, mild dysphoria and related symptoms, good self-control (both objective and subjective assessment), few other risk factors, and identifiable protective factors, including available and accessible social support.   Follow-up Information    [facility] Follow up on 08/11/2020.   Why: You have an appointment with 08/13/2020 on Thursday, 08/11/20 at 10:00am. This is a virtual appointment via Zoom. Thanks! Contact information: [address] [place]  [phone]                Plan Of Care/Follow-up recommendations:  Activity:  as tolerated Diet:  regular diet  Jesse Sans, MD 08/04/2020, 10:18 AM

## 2020-08-04 NOTE — Progress Notes (Signed)
Pt received dc packet, prescriptions and belongings. Pt denies SI, HI and AVH. Pt was educated on dc plan and verbalizes understanding. Torrie Mayers RN

## 2020-08-04 NOTE — Progress Notes (Signed)
  [facility] Adult Case Management Discharge Plan :  Will you be returning to the same living situation after discharge:  Yes,  Pt's home At discharge, do you have transportation home?: Yes,  Pt's family Do you have the ability to pay for your medications: No.  Release of information consent forms completed and in the chart;  Patient's signature needed at discharge.  Patient to Follow up at:  Follow-up Information    [facility] Follow up on 08/11/2020.   Why: You have an appointment with Hardie Shackleton on Thursday, 08/11/20 at 10:00am. This is a virtual appointment via Zoom. Thanks! Contact information: [address] [place] [address] [phone]               Next level of care provider has access to [facility] Link:no  Safety Planning and Suicide Prevention discussed: Yes,  completed with pt's fiance     Has patient been referred to the Quitline?: N/A patient is not a smoker  Patient has been referred for addiction treatment: N/A  Kjersti Dittmer A [place], LCSWA 08/04/2020, 9:47 AM

## 2020-08-04 NOTE — Plan of Care (Signed)
  Problem: Anxiety Goal: STG - Patient will identify 3 benefits of managing their anxiety in a healthy manner within 5 recreation therapy group sessions Description: STG - Patient will identify 3 benefits of managing their anxiety in a healthy manner within 5 recreation therapy group sessions 08/04/2020 1602 by Alveria Apley, LRT Outcome: Adequate for Discharge 08/04/2020 1602 by Alveria Apley, LRT Outcome: Adequate for Discharge
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
## 2020-08-05 NOTE — Progress Notes (Deleted)
Name: [name]   MRN: [id number]    DOB: 03/26/1986   Date:08/05/2020       Progress Note  Subjective  Chief Complaint  Hypertension/Headaches  HPI    Patient Active Problem List   Diagnosis Date Noted  . Generalized anxiety disorder 08/03/2020  . Insomnia due to mental condition 08/03/2020  . S/P laparoscopic hysterectomy 12/25/2019  . Pelvic pain 11/27/2019  . Dyspareunia due to medical condition in female 11/27/2019  . Dysmenorrhea 11/27/2019  . Radial styloid tenosynovitis 10/13/2019  . Bipolar affective disorder (HCC) 01/26/2015  . Seizures (HCC) 01/12/2015  . Asthma   . Hyperlipidemia   . Bipolar 2 disorder (HCC)   . Allergy   . Migraine headache with aura     Past Surgical History:  Procedure Laterality Date  . CARPAL TUNNEL RELEASE  2015   left  . CYSTOSCOPY  11/27/2019   Procedure: CYSTOSCOPY;  Surgeon: Nadara Mustard, MD;  Location: [facility] ORS;  Service: Gynecology;;  . ECTOPIC PREGNANCY SURGERY  2011  . HERNIA REPAIR  2010  . TOTAL LAPAROSCOPIC HYSTERECTOMY WITH SALPINGECTOMY Bilateral 11/27/2019   Procedure: TOTAL LAPAROSCOPIC HYSTERECTOMY WITH SALPINGECTOMY;  Surgeon: Nadara Mustard, MD;  Location: [facility] ORS;  Service: Gynecology;  Laterality: Bilateral;  needs RNFA  . TUBAL LIGATION      Family History  Problem Relation Age of Onset  . Asthma Mother   . Heart disease Mother   . Diabetes Mother   . Hyperlipidemia Mother   . Hypertension Mother   . Mental illness Mother   . Migraines Mother   . Thyroid disease Mother   . Stroke Mother   . Alcohol abuse Mother   . Bipolar disorder Mother   . Anxiety disorder Mother   . COPD Mother   . Hypertension Sister   . Depression Sister   . Drug abuse Sister   . Cancer Maternal Grandmother   . Diabetes Maternal Grandmother   . Heart disease Maternal Grandmother   . Hyperlipidemia Maternal Grandmother   . Hypertension Maternal Grandmother   . Kidney disease Maternal Grandmother   . Heart disease  Paternal Grandfather   . Alcohol abuse Paternal Grandfather   . Drug abuse Paternal Grandfather     Social History   Tobacco Use  . Smoking status: Former Smoker    Packs/day: 1.00    Years: 15.00    Pack years: 15.00    Types: Cigarettes    Start date: 09/07/2019    Quit date: 08/2019    Years since quitting: 0.9  . Smokeless tobacco: Never Used  . Tobacco comment: did not want to quit but can't inhale w/ coughing  Substance Use Topics  . Alcohol use: No    Alcohol/week: 0.0 standard drinks     Current Outpatient Medications:  .  albuterol (VENTOLIN HFA) 108 (90 Base) MCG/ACT inhaler, Inhale 2 puffs into the lungs every 4 (four) hours as needed for wheezing or shortness of breath., Disp: 18 g, Rfl: 0 .  ARIPiprazole (ABILIFY) 5 MG tablet, Take 1 tablet (5 mg total) by mouth daily., Disp: 30 tablet, Rfl: 1 .  divalproex (DEPAKOTE ER) 500 MG 24 hr tablet, Take 1 tablet (500 mg total) by mouth at bedtime., Disp: 30 tablet, Rfl: 1 .  FLUoxetine (PROZAC) 40 MG capsule, Take 1 capsule (40 mg total) by mouth daily., Disp: 30 capsule, Rfl: 1 .  fluticasone (FLONASE) 50 MCG/ACT nasal spray, Place 1 spray into both nostrils 2 (two) times daily. (  Patient not taking: Reported on 08/02/2020), Disp: 16 g, Rfl: 0 .  Fluticasone-Umeclidin-Vilant (TRELEGY ELLIPTA) 200-62.5-25 MCG/INH AEPB, Inhale 1 puff into the lungs daily., Disp: 28 each, Rfl: 11 .  hydrOXYzine (ATARAX/VISTARIL) 50 MG tablet, Take 1 tablet (50 mg total) by mouth 3 (three) times daily as needed for anxiety., Disp: 90 tablet, Rfl: 1 .  Multiple Vitamins-Minerals (MULTIVITAMIN WITH MINERALS) tablet, Take 1 tablet by mouth daily. (Patient not taking: Reported on 08/02/2020), Disp: , Rfl:  .  Spacer/Aero-Holding Chambers (AEROCHAMBER PLUS) inhaler, Use as instructed, Disp: 1 each, Rfl: 2 .  traZODone (DESYREL) 100 MG tablet, Take 1 tablet (100 mg total) by mouth at bedtime as needed for sleep., Disp: 30 tablet, Rfl: 1  Allergies   Allergen Reactions  . Robitussin (Alcohol Free) [Guaifenesin] Anaphylaxis  . Strawberry Extract Rash    CAN'T EVEN TOUCH STRAWBERRIES  . Tramadol     Due to Seizures per patient  . Aspirin Other (See Comments)    Nose bleeds  . Chocolate Flavor Rash  . Codeine Rash    I personally reviewed {Reviewed:14835} with the patient/caregiver today.   ROS  ***  Objective  There were no vitals filed for this visit.  There is no height or weight on file to calculate BMI.  Physical Exam ***  Recent Results (from the past 2160 hour(s))  Resp Panel by RT-PCR (Flu A&B, Covid) Nasopharyngeal Swab     Status: None   Collection Time: 05/24/20  9:52 AM   Specimen: Nasopharyngeal Swab; Nasopharyngeal(NP) swabs in vial transport medium  Result Value Ref Range   SARS Coronavirus 2 by RT PCR NEGATIVE NEGATIVE    Comment: (NOTE) SARS-CoV-2 target nucleic acids are NOT DETECTED.  The SARS-CoV-2 RNA is generally detectable in upper respiratory specimens during the acute phase of infection. The lowest concentration of SARS-CoV-2 viral copies this assay can detect is 138 copies/mL. A negative result does not preclude SARS-Cov-2 infection and should not be used as the sole basis for treatment or other patient management decisions. A negative result may occur with  improper specimen collection/handling, submission of specimen other than nasopharyngeal swab, presence of viral mutation(s) within the areas targeted by this assay, and inadequate number of viral copies(<138 copies/mL). A negative result must be combined with clinical observations, patient history, and epidemiological information. The expected result is Negative.  Fact Sheet for Patients:  BloggerCourse.com  Fact Sheet for Healthcare Providers:  SeriousBroker.it  This test is no t yet approved or cleared by the [place] FDA and  has been authorized for detection and/or  diagnosis of SARS-CoV-2 by FDA under an Emergency Use Authorization (EUA). This EUA will remain  in effect (meaning this test can be used) for the duration of the COVID-19 declaration under Section 564(b)(1) of the Act, 21 U.S.C.section 360bbb-3(b)(1), unless the authorization is terminated  or revoked sooner.       Influenza A by PCR NEGATIVE NEGATIVE   Influenza B by PCR NEGATIVE NEGATIVE    Comment: (NOTE) The Xpert Xpress SARS-CoV-2/FLU/RSV plus assay is intended as an aid in the diagnosis of influenza from Nasopharyngeal swab specimens and should not be used as a sole basis for treatment. Nasal washings and aspirates are unacceptable for Xpert Xpress SARS-CoV-2/FLU/RSV testing.  Fact Sheet for Patients: BloggerCourse.com  Fact Sheet for Healthcare Providers: SeriousBroker.it  This test is not yet approved or cleared by the [place] FDA and has been authorized for detection and/or diagnosis of SARS-CoV-2 by FDA under an Emergency  Use Authorization (EUA). This EUA will remain in effect (meaning this test can be used) for the duration of the COVID-19 declaration under Section 564(b)(1) of the Act, 21 U.S.C. section 360bbb-3(b)(1), unless the authorization is terminated or revoked.  Performed at [facility], [address] [place], [place] [address]   Group A Strep by PCR [facility] Only)     Status: None   Collection Time: 05/24/20  9:52 AM   Specimen: Nasopharyngeal Swab; Sterile Swab  Result Value Ref Range   Group A Strep by PCR NOT DETECTED NOT DETECTED    Comment: Performed at [facility], [address] [place], [place] [address]  Basic metabolic panel     Status: Abnormal   Collection Time: 06/28/20  5:22 PM  Result Value Ref Range   Sodium 137 135 - 145 mmol/L   Potassium 3.8 3.5 - 5.1 mmol/L   Chloride 103 98 - 111 mmol/L   CO2 24 22 - 32 mmol/L   Glucose, Bld 116 (H) 70 - 99 mg/dL     Comment: Glucose reference range applies only to samples taken after fasting for at least 8 hours.   BUN 14 6 - 20 mg/dL   Creatinine, Ser 0.98 0.44 - 1.00 mg/dL   Calcium 9.9 8.9 - 11.9 mg/dL   GFR, Estimated >14 >78 mL/min    Comment: (NOTE) Calculated using the CKD-EPI Creatinine Equation (2021)    Anion gap 10 5 - 15    Comment: Performed at [facility], [address] [place], [place] [address]  CBC     Status: Abnormal   Collection Time: 06/28/20  5:22 PM  Result Value Ref Range   WBC 17.1 (H) 4.0 - 10.5 K/uL   RBC 4.66 3.87 - 5.11 MIL/uL   Hemoglobin 13.8 12.0 - 15.0 g/dL   HCT 13.0 86.5 - 78.4 %   MCV 88.2 80.0 - 100.0 fL   MCH 29.6 26.0 - 34.0 pg   MCHC 33.6 30.0 - 36.0 g/dL   RDW 69.6 29.5 - 28.4 %   Platelets 363 150 - 400 K/uL   nRBC 0.0 0.0 - 0.2 %    Comment: Performed at [facility], [address], [place], [place] [address]  Resp Panel by RT-PCR (Flu A&B, Covid) Nasopharyngeal Swab     Status: None   Collection Time: 06/28/20  7:55 PM   Specimen: Nasopharyngeal Swab; Nasopharyngeal(NP) swabs in vial transport medium  Result Value Ref Range   SARS Coronavirus 2 by RT PCR NEGATIVE NEGATIVE    Comment: (NOTE) SARS-CoV-2 target nucleic acids are NOT DETECTED.  The SARS-CoV-2 RNA is generally detectable in upper respiratory specimens during the acute phase of infection. The lowest concentration of SARS-CoV-2 viral copies this assay can detect is 138 copies/mL. A negative result does not preclude SARS-Cov-2 infection and should not be used as the sole basis for treatment or other patient management decisions. A negative result may occur with  improper specimen collection/handling, submission of specimen other than nasopharyngeal swab, presence of viral mutation(s) within the areas targeted by this assay, and inadequate number of viral copies(<138 copies/mL). A negative result must be combined with clinical observations, patient history, and  epidemiological information. The expected result is Negative.  Fact Sheet for Patients:  BloggerCourse.com  Fact Sheet for Healthcare Providers:  SeriousBroker.it  This test is no t yet approved or cleared by the [place] FDA and  has been authorized for detection and/or diagnosis of SARS-CoV-2 by  FDA under an Emergency Use Authorization (EUA). This EUA will remain  in effect (meaning this test can be used) for the duration of the COVID-19 declaration under Section 564(b)(1) of the Act, 21 U.S.C.section 360bbb-3(b)(1), unless the authorization is terminated  or revoked sooner.       Influenza A by PCR NEGATIVE NEGATIVE   Influenza B by PCR NEGATIVE NEGATIVE    Comment: (NOTE) The Xpert Xpress SARS-CoV-2/FLU/RSV plus assay is intended as an aid in the diagnosis of influenza from Nasopharyngeal swab specimens and should not be used as a sole basis for treatment. Nasal washings and aspirates are unacceptable for Xpert Xpress SARS-CoV-2/FLU/RSV testing.  Fact Sheet for Patients: BloggerCourse.com  Fact Sheet for Healthcare Providers: SeriousBroker.it  This test is not yet approved or cleared by the [place] FDA and has been authorized for detection and/or diagnosis of SARS-CoV-2 by FDA under an Emergency Use Authorization (EUA). This EUA will remain in effect (meaning this test can be used) for the duration of the COVID-19 declaration under Section 564(b)(1) of the Act, 21 U.S.C. section 360bbb-3(b)(1), unless the authorization is terminated or revoked.  Performed at [facility], [address] [place], [place] [address]   POC urine preg, ED     Status: None   Collection Time: 06/28/20  8:21 PM  Result Value Ref Range   Preg Test, Ur NEGATIVE NEGATIVE    Comment:        THE SENSITIVITY OF THIS METHODOLOGY IS >24 mIU/mL   Urinalysis, Complete w  Microscopic Urine, Clean Catch     Status: Abnormal   Collection Time: 06/28/20  8:31 PM  Result Value Ref Range   Color, Urine YELLOW (A) YELLOW   APPearance CLEAR (A) CLEAR   Specific Gravity, Urine 1.016 1.005 - 1.030   pH 5.0 5.0 - 8.0   Glucose, UA NEGATIVE NEGATIVE mg/dL   Hgb urine dipstick NEGATIVE NEGATIVE   Bilirubin Urine NEGATIVE NEGATIVE   Ketones, ur NEGATIVE NEGATIVE mg/dL   Protein, ur NEGATIVE NEGATIVE mg/dL   Nitrite NEGATIVE NEGATIVE   Leukocytes,Ua NEGATIVE NEGATIVE   WBC, UA 0-5 0 - 5 WBC/hpf   Bacteria, UA NONE SEEN NONE SEEN   Squamous Epithelial / LPF 0-5 0 - 5   Mucus PRESENT     Comment: Performed at [facility], [address] [place], [place] [address]  Urine Drug Screen, Qualitative     Status: None   Collection Time: 08/02/20 11:34 AM  Result Value Ref Range   Tricyclic, Ur Screen NONE DETECTED NONE DETECTED   Amphetamines, Ur Screen NONE DETECTED NONE DETECTED   MDMA (Ecstasy)Ur Screen NONE DETECTED NONE DETECTED   Cocaine Metabolite,Ur [place] NONE DETECTED NONE DETECTED   Opiate, Ur Screen NONE DETECTED NONE DETECTED   Phencyclidine (PCP) Ur S NONE DETECTED NONE DETECTED   Cannabinoid 50 Ng, Ur [place] NONE DETECTED NONE DETECTED   Barbiturates, Ur Screen NONE DETECTED NONE DETECTED   Benzodiazepine, Ur Scrn NONE DETECTED NONE DETECTED   Methadone Scn, Ur NONE DETECTED NONE DETECTED    Comment: (NOTE) Tricyclics + metabolites, urine    Cutoff 1000 ng/mL Amphetamines + metabolites, urine  Cutoff 1000 ng/mL MDMA (Ecstasy), urine              Cutoff 500 ng/mL Cocaine Metabolite, urine          Cutoff 300 ng/mL Opiate + metabolites, urine        Cutoff 300 ng/mL Phencyclidine (PCP), urine  Cutoff 25 ng/mL Cannabinoid, urine                 Cutoff 50 ng/mL Barbiturates + metabolites, urine  Cutoff 200 ng/mL Benzodiazepine, urine              Cutoff 200 ng/mL Methadone, urine                   Cutoff 300 ng/mL  The urine drug screen  provides only a preliminary, unconfirmed analytical test result and should not be used for non-medical purposes. Clinical consideration and professional judgment should be applied to any positive drug screen result due to possible interfering substances. A more specific alternate chemical method must be used in order to obtain a confirmed analytical result. Gas chromatography / mass spectrometry (GC/MS) is the preferred confirm atory method. Performed at [facility], [address] [place], [place] [address]   Comprehensive metabolic panel     Status: Abnormal   Collection Time: 08/02/20 11:41 AM  Result Value Ref Range   Sodium 138 135 - 145 mmol/L   Potassium 4.0 3.5 - 5.1 mmol/L   Chloride 107 98 - 111 mmol/L   CO2 20 (L) 22 - 32 mmol/L   Glucose, Bld 106 (H) 70 - 99 mg/dL    Comment: Glucose reference range applies only to samples taken after fasting for at least 8 hours.   BUN 7 6 - 20 mg/dL   Creatinine, Ser 6.28 0.44 - 1.00 mg/dL   Calcium 9.8 8.9 - 31.5 mg/dL   Total Protein 8.1 6.5 - 8.1 g/dL   Albumin 4.5 3.5 - 5.0 g/dL   AST 22 15 - 41 U/L   ALT 18 0 - 44 U/L   Alkaline Phosphatase 64 38 - 126 U/L   Total Bilirubin 0.3 0.3 - 1.2 mg/dL   GFR, Estimated >17 >61 mL/min    Comment: (NOTE) Calculated using the CKD-EPI Creatinine Equation (2021)    Anion gap 11 5 - 15    Comment: Performed at [facility], [address] [place], [place] [address]  Ethanol     Status: None   Collection Time: 08/02/20 11:41 AM  Result Value Ref Range   Alcohol, Ethyl (B) <10 <10 mg/dL    Comment: (NOTE) Lowest detectable limit for serum alcohol is 10 mg/dL.  For medical purposes only. Performed at [facility], [address] [place], [place] [address]   Salicylate level     Status: Abnormal   Collection Time: 08/02/20 11:41 AM  Result Value Ref Range   Salicylate Lvl <7.0 (L) 7.0 - 30.0 mg/dL    Comment: Performed at [facility], [address] [place], [place] [address]  Acetaminophen level     Status: Abnormal   Collection Time: 08/02/20 11:41 AM  Result Value Ref Range   Acetaminophen (Tylenol), Serum <10 (L) 10 - 30 ug/mL    Comment: (NOTE) Therapeutic concentrations vary significantly. A range of 10-30 ug/mL  may be an effective concentration for many patients. However, some  are best treated at concentrations outside of this range. Acetaminophen concentrations >150 ug/mL at 4 hours after ingestion  and >50 ug/mL at 12 hours after ingestion are often associated with  toxic reactions.  Performed at [facility], [address] [place], [place] [address]   cbc     Status: Abnormal   Collection Time: 08/02/20 11:41 AM  Result Value Ref Range   WBC 17.1 (H) 4.0 -  10.5 K/uL   RBC 4.72 3.87 - 5.11 MIL/uL   Hemoglobin 13.8 12.0 - 15.0 g/dL   HCT 09.6 04.5 - 40.9 %   MCV 87.1 80.0 - 100.0 fL   MCH 29.2 26.0 - 34.0 pg   MCHC 33.6 30.0 - 36.0 g/dL   RDW 81.1 91.4 - 78.2 %   Platelets 374 150 - 400 K/uL   nRBC 0.0 0.0 - 0.2 %    Comment: Performed at [facility], [address] [place], [place] [address]  Valproic acid level     Status: Abnormal   Collection Time: 08/02/20 11:41 AM  Result Value Ref Range   Valproic Acid Lvl <10 (L) 50.0 - 100.0 ug/mL    Comment: Performed at [facility], [address] [place], [place] [address]  Lipid panel     Status: Abnormal   Collection Time: 08/02/20 11:41 AM  Result Value Ref Range   Cholesterol 215 (H) 0 - 200 mg/dL   Triglycerides 784 <696 mg/dL   HDL 45 >29 mg/dL   Total CHOL/HDL Ratio 4.8 RATIO   VLDL 28 0 - 40 mg/dL   LDL Cholesterol 528 (H) 0 - 99 mg/dL    Comment:        Total Cholesterol/HDL:CHD Risk Coronary Heart Disease Risk Table                     Men   Women  1/2 Average Risk   3.4   3.3  Average Risk       5.0   4.4  2 X Average Risk   9.6   7.1  3 X Average Risk  23.4   11.0        Use the calculated  Patient Ratio above and the CHD Risk Table to determine the patient's CHD Risk.        ATP III CLASSIFICATION (LDL):  <100     mg/dL   Optimal  413-244  mg/dL   Near or Above                    Optimal  130-159  mg/dL   Borderline  010-272  mg/dL   High  >536     mg/dL   Very High Performed at [facility], [address] [place], [place] [address]   Hemoglobin A1c     Status: None   Collection Time: 08/02/20 11:41 AM  Result Value Ref Range   Hgb A1c MFr Bld 5.5 4.8 - 5.6 %    Comment: (NOTE) Pre diabetes:          5.7%-6.4%  Diabetes:              >6.4%  Glycemic control for   <7.0% adults with diabetes    Mean Plasma Glucose 111.15 mg/dL    Comment: Performed at [facility], [address] [place], [place] [address]  Pregnancy, urine     Status: None   Collection Time: 08/02/20 11:41 AM  Result Value Ref Range   Preg Test, Ur NEGATIVE NEGATIVE    Comment: Performed at [facility], [address] [place], [place] [address]  Resp Panel by RT-PCR (Flu A&B, Covid) Nasopharyngeal Swab     Status: None   Collection Time: 08/02/20  1:27 PM   Specimen: Nasopharyngeal Swab; Nasopharyngeal(NP) swabs in vial transport medium  Result Value Ref Range   SARS Coronavirus 2 by RT PCR NEGATIVE NEGATIVE  Comment: (NOTE) SARS-CoV-2 target nucleic acids are NOT DETECTED.  The SARS-CoV-2 RNA is generally detectable in upper respiratory specimens during the acute phase of infection. The lowest concentration of SARS-CoV-2 viral copies this assay can detect is 138 copies/mL. A negative result does not preclude SARS-Cov-2 infection and should not be used as the sole basis for treatment or other patient management decisions. A negative result may occur with  improper specimen collection/handling, submission of specimen other than nasopharyngeal swab, presence of viral mutation(s) within the areas targeted by this assay, and inadequate number of  viral copies(<138 copies/mL). A negative result must be combined with clinical observations, patient history, and epidemiological information. The expected result is Negative.  Fact Sheet for Patients:  BloggerCourse.comhttps://www.fda.gov/media/152166/download  Fact Sheet for Healthcare Providers:  SeriousBroker.ithttps://www.fda.gov/media/152162/download  This test is no t yet approved or cleared by the [place] FDA and  has been authorized for detection and/or diagnosis of SARS-CoV-2 by FDA under an Emergency Use Authorization (EUA). This EUA will remain  in effect (meaning this test can be used) for the duration of the COVID-19 declaration under Section 564(b)(1) of the Act, 21 U.S.C.section 360bbb-3(b)(1), unless the authorization is terminated  or revoked sooner.       Influenza A by PCR NEGATIVE NEGATIVE   Influenza B by PCR NEGATIVE NEGATIVE    Comment: (NOTE) The Xpert Xpress SARS-CoV-2/FLU/RSV plus assay is intended as an aid in the diagnosis of influenza from Nasopharyngeal swab specimens and should not be used as a sole basis for treatment. Nasal washings and aspirates are unacceptable for Xpert Xpress SARS-CoV-2/FLU/RSV testing.  Fact Sheet for Patients: BloggerCourse.comhttps://www.fda.gov/media/152166/download  Fact Sheet for Healthcare Providers: SeriousBroker.ithttps://www.fda.gov/media/152162/download  This test is not yet approved or cleared by the [place] FDA and has been authorized for detection and/or diagnosis of SARS-CoV-2 by FDA under an Emergency Use Authorization (EUA). This EUA will remain in effect (meaning this test can be used) for the duration of the COVID-19 declaration under Section 564(b)(1) of the Act, 21 U.S.C. section 360bbb-3(b)(1), unless the authorization is terminated or revoked.  Performed at [facility], [address] [place], [place] [address]     Diabetic Foot Exam: Diabetic Foot Exam - Simple   No data filed    ***  PHQ2/9: [facility]  11/12/2019 10/13/2019 07/08/2019 12/17/2018 12/08/2018  Decreased Interest 2 0 0 2 1  Down, Depressed, Hopeless 1 0 0 3 1  PHQ - 2 Score 3 0 0 5 2  Altered sleeping 1 0 0 3 3  Tired, decreased energy 3 2 0 3 2  Change in appetite 3 1 0 2 3  Feeling bad or failure about yourself  1 0 0 2 3  Trouble concentrating 1 1 0 2 2  Moving slowly or fidgety/restless 1 0 0 0 2  Suicidal thoughts 0 0 0 0 1  PHQ-9 Score 13 4 0 17 18  Difficult doing work/chores Somewhat difficult Not difficult at all Not difficult at all Very difficult Somewhat difficult    phq 9 is {gen pos [id number]}neg:[id number]} ***  Fall Risk: Fall Risk  11/12/2019 10/13/2019 07/08/2019 12/17/2018 12/08/2018  Falls in the past year? 0 0 0 0 0  Number falls in past yr: 0 0 0 0 0  Injury with Fall? 0 0 0 0 0  Follow up - - Falls evaluation completed - Falls evaluation completed   ***   Functional Status Survey:   ***   Assessment & Plan  ***  There are no diagnoses linked to this encounter.

## 2020-08-08 ENCOUNTER — Ambulatory Visit: Payer: Self-pay | Admitting: Family Medicine

## 2020-09-20 IMAGING — US US EXTREM LOW VENOUS*R*
1 series · 13 of 24 positions shown · non-contrast
Comparison: None.

CLINICAL DATA: Right lower extremity/calf pain



[Series 1: us extrem low venous*right* · 13 of 44 slices shown]
[im 1/44]
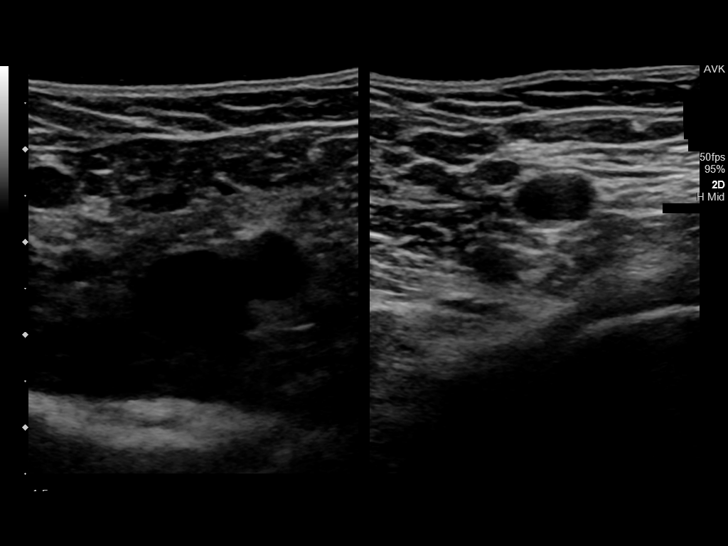
[im 4/44]
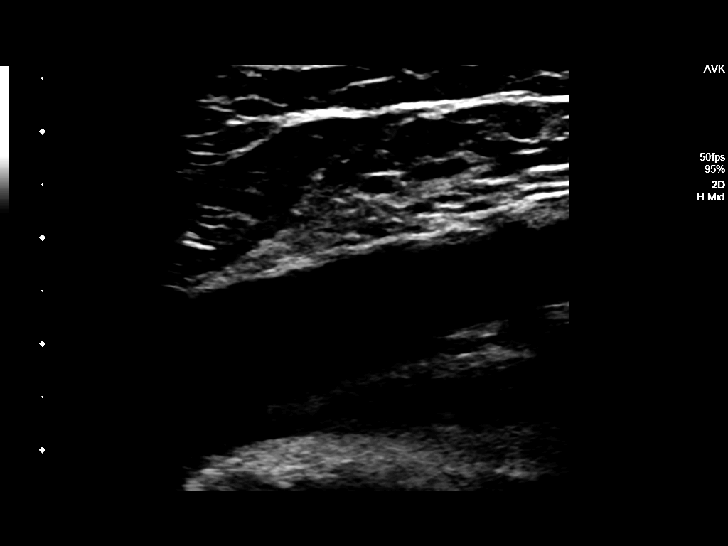
[im 8/44]
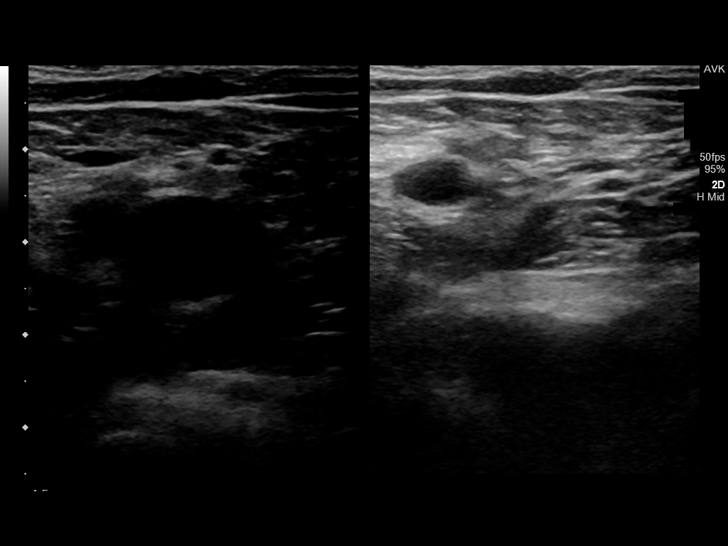
[im 12/44]
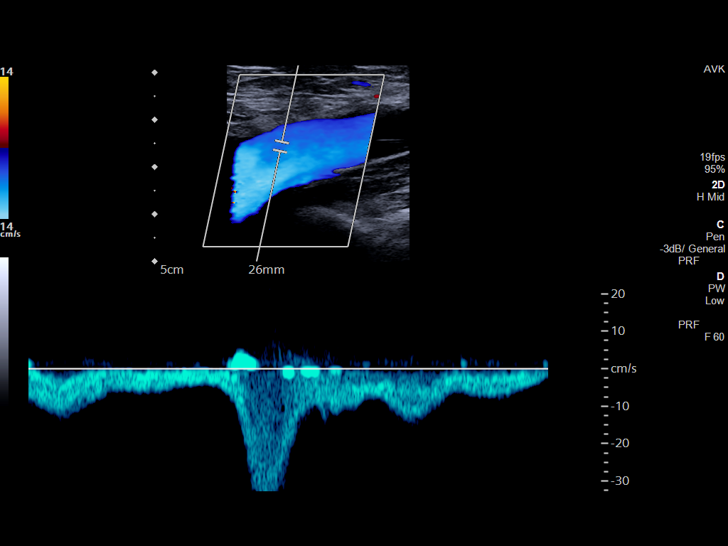
[im 15/44]
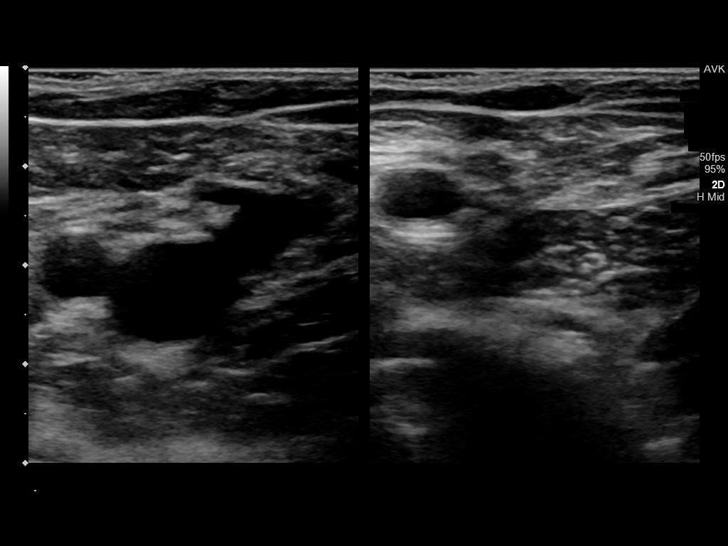
[im 19/44]
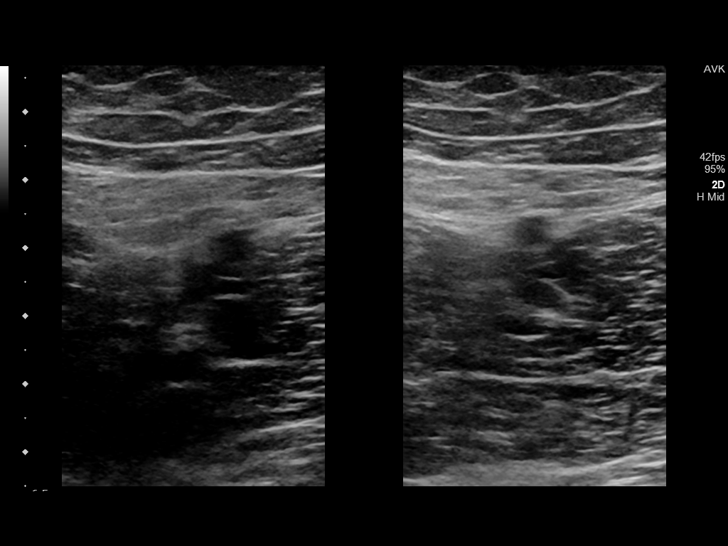
[im 23/44]
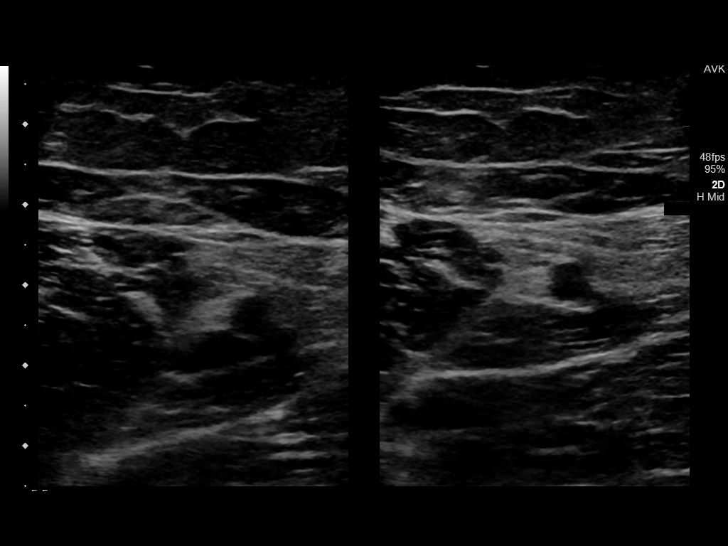
[im 25/44]
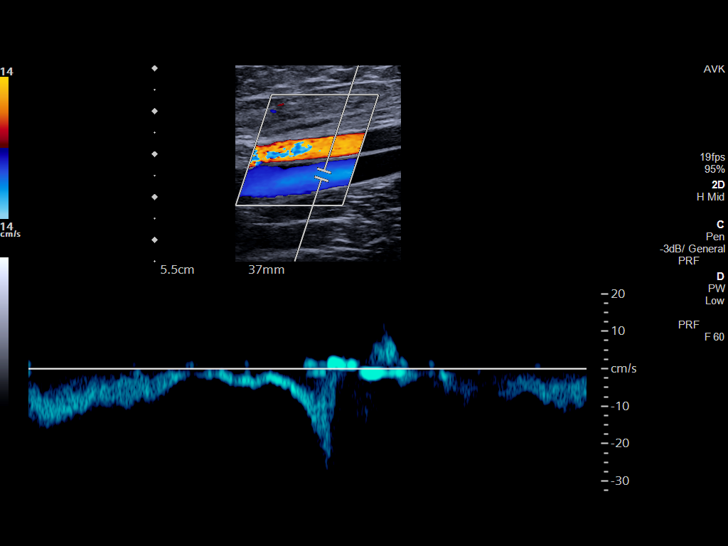
[im 29/44]
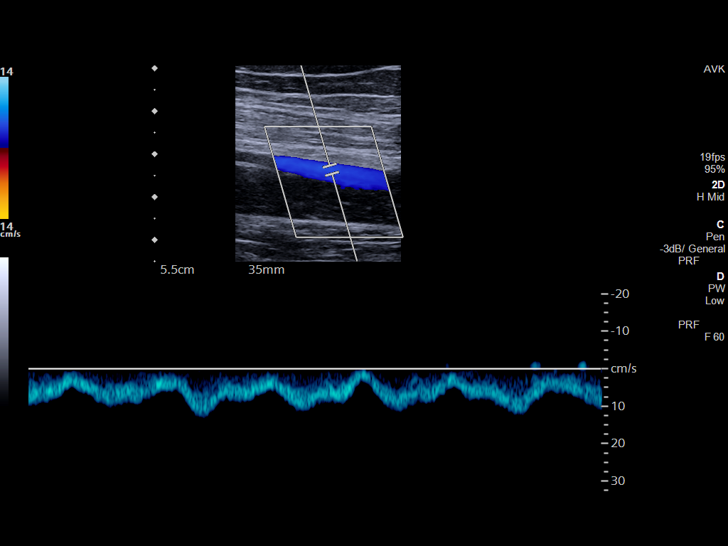
[im 32/44]
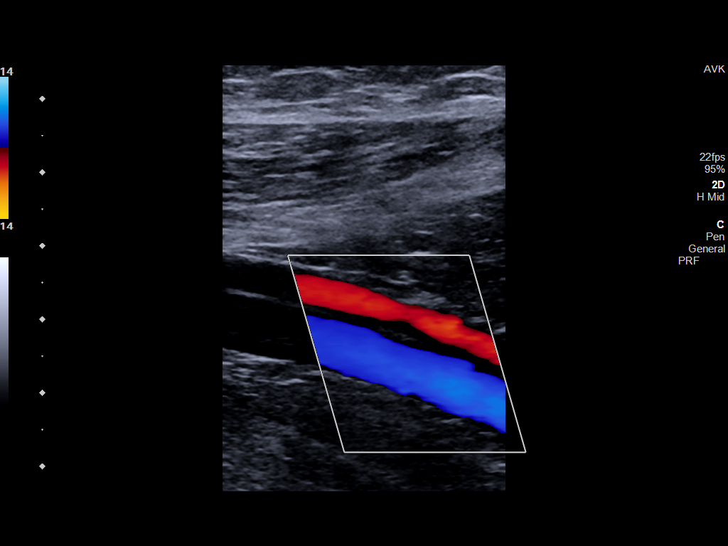
[im 36/44]
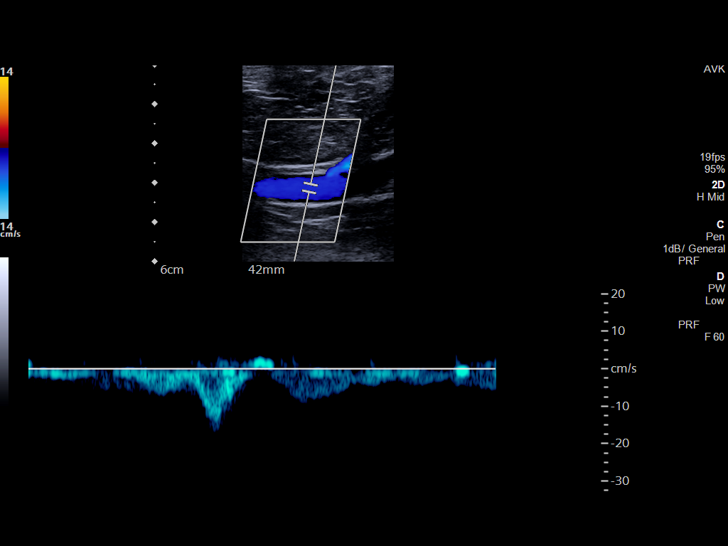
[im 40/44]
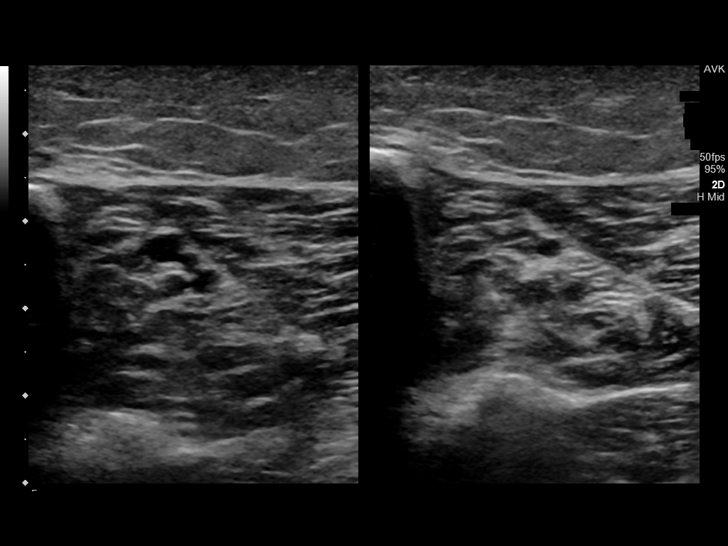
[im 44/44]
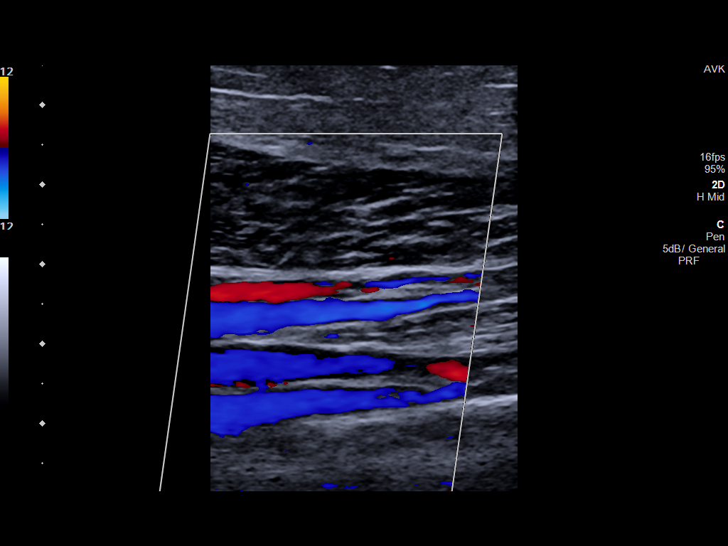

[13 of 24 positions shown; findings below may reference images not displayed]

FINDINGS: Contralateral Common Femoral Vein: Respiratory phasicity is normal
and symmetric with the symptomatic side. No evidence of thrombus.
Normal compressibility.

Common Femoral Vein: No evidence of thrombus. Normal
compressibility, respiratory phasicity and response to augmentation.

Saphenofemoral Junction: No evidence of thrombus. Normal
compressibility and flow on color Doppler imaging.

Profunda Femoral Vein: No evidence of thrombus. Normal
compressibility and flow on color Doppler imaging.

Femoral Vein: No evidence of thrombus. Normal compressibility,
respiratory phasicity and response to augmentation.

Popliteal Vein: No evidence of thrombus. Normal compressibility,
respiratory phasicity and response to augmentation.

Calf Veins: No evidence of thrombus. Normal compressibility and flow
on color Doppler imaging.

Superficial Great Saphenous Vein: No evidence of thrombus. Normal
compressibility.
IMPRESSION: No evidence of deep venous thrombosis.

## 2020-10-10 ENCOUNTER — Other Ambulatory Visit: Payer: Self-pay

## 2020-10-10 ENCOUNTER — Emergency Department
Admission: EM | Admit: 2020-10-10 | Discharge: 2020-10-10 | Disposition: A | Discharge status: Home / Self Care | Payer: Medicaid Other | Attending: Emergency Medicine | Admitting: Emergency Medicine

## 2020-10-10 DIAGNOSIS — J45909 Unspecified asthma, uncomplicated: Secondary | ICD-10-CM | POA: Insufficient documentation

## 2020-10-10 DIAGNOSIS — K029 Dental caries, unspecified: Secondary | ICD-10-CM | POA: Insufficient documentation

## 2020-10-10 DIAGNOSIS — F17210 Nicotine dependence, cigarettes, uncomplicated: Secondary | ICD-10-CM | POA: Insufficient documentation

## 2020-10-10 DIAGNOSIS — Z7951 Long term (current) use of inhaled steroids: Secondary | ICD-10-CM | POA: Insufficient documentation

## 2020-10-10 DIAGNOSIS — Z79899 Other long term (current) drug therapy: Secondary | ICD-10-CM | POA: Insufficient documentation

## 2020-10-10 MED ORDER — AMOXICILLIN 875 MG PO TABS
875.0000 mg | ORAL_TABLET | Freq: Two times a day (BID) | ORAL | 0 refills | Status: DC
Start: 2020-10-10 — End: 2020-11-04

## 2020-10-10 NOTE — ED Provider Notes (Signed)
[facility] Emergency Department Provider Note  ____________________________________________   Event Date/Time   First MD Initiated Contact with Patient 10/10/20 [phone]     (approximate)  I have reviewed the triage vital signs and the nursing notes.   HISTORY  Chief Complaint Dental Pain   HPI [name] is a 34 y.o. female presents to the ED with complaint of dental pain for the last 2 days.  Patient reports pain on the right lower aspect.  She denies any fever or chills.  Patient currently does not have a dentist.  Her pain is 7 out of 10.      Past Medical History:  Diagnosis Date  . Allergy   . Anxiety   . Asthma 11/24/2019   blood allergy testing done 11/24/19 to see what causes it to flare up  . Bipolar 1 disorder (HCC)   . Bipolar disorder (HCC)   . Chronic cough    being worked up with Dr. Jayme Cloud  . Depression   . Dyspareunia in female 11/2019  . Dysrhythmia    TACHY BUT NOT A PROBLEM  . Ectopic pregnancy    had surgery  . GERD (gastroesophageal reflux disease)   . History of concussion 2016  . Hyperlipidemia   . Migraine    has an aura. happening frequently  . MRSA (methicillin resistant Staphylococcus aureus)    was on chin.  many years ago  . NSVD (normal spontaneous vaginal delivery)    x 2  . Poor dentition   . Pseudoseizures (HCC)    related to anxiety  . Scoliosis   . Scoliosis     Patient Active Problem List   Diagnosis Date Noted  . Generalized anxiety disorder 08/03/2020  . Insomnia due to mental condition 08/03/2020  . S/P laparoscopic hysterectomy 12/25/2019  . Pelvic pain 11/27/2019  . Dyspareunia due to medical condition in female 11/27/2019  . Dysmenorrhea 11/27/2019  . Radial styloid tenosynovitis 10/13/2019  . Bipolar affective disorder (HCC) 01/26/2015  . Seizures (HCC) 01/12/2015  . Asthma   . Hyperlipidemia   . Bipolar 2 disorder (HCC)   . Allergy   . Migraine headache with aura     Past  Surgical History:  Procedure Laterality Date  . CARPAL TUNNEL RELEASE  2015   left  . CYSTOSCOPY  11/27/2019   Procedure: CYSTOSCOPY;  Surgeon: Nadara Mustard, MD;  Location: [facility] ORS;  Service: Gynecology;;  . ECTOPIC PREGNANCY SURGERY  2011  . HERNIA REPAIR  2010  . TOTAL LAPAROSCOPIC HYSTERECTOMY WITH SALPINGECTOMY Bilateral 11/27/2019   Procedure: TOTAL LAPAROSCOPIC HYSTERECTOMY WITH SALPINGECTOMY;  Surgeon: Nadara Mustard, MD;  Location: [facility] ORS;  Service: Gynecology;  Laterality: Bilateral;  needs RNFA  . TUBAL LIGATION      Prior to Admission medications   Medication Sig Start Date End Date Taking? Authorizing Provider  amoxicillin (AMOXIL) 875 MG tablet Take 1 tablet (875 mg total) by mouth 2 (two) times daily. 10/10/20  Yes Tommi Rumps, PA-C  albuterol (VENTOLIN HFA) 108 (90 Base) MCG/ACT inhaler Inhale 2 puffs into the lungs every 4 (four) hours as needed for wheezing or shortness of breath. 07/08/19   Domenick Gong, MD  ARIPiprazole (ABILIFY) 5 MG tablet TAKE ONE TABLET BY MOUTH EVERY DAY 08/04/20 03/24/21  Jesse Sans, MD  ARIPiprazole (ABILIFY) 5 MG tablet TAKE 1 TABLET BY MOUTH DAILY 08/03/20 08/03/21  Jesse Sans, MD  divalproex (DEPAKOTE ER) 500 MG 24 hr tablet TAKE ONE  TABLET BY MOUTH AT BEDTIME 08/04/20 12/22/20  Jesse Sans, MD  divalproex (DEPAKOTE ER) 500 MG 24 hr tablet TAKE 1 TABLET BY MOUTH AT BEDTIME FOR 7 DAYS. 08/03/20 08/03/21  Jesse Sans, MD  FLUoxetine (PROZAC) 40 MG capsule TAKE ONE CAPSULE BY MOUTH EVERY DAY 08/04/20 08/04/21  Jesse Sans, MD  FLUoxetine (PROZAC) 40 MG capsule TAKE 1 CAPSULE BY MOUTH DAILY 08/03/20 08/03/21  Jesse Sans, MD  Fluticasone-Umeclidin-Vilant (TRELEGY ELLIPTA) 200-62.5-25 MCG/INH AEPB Inhale 1 puff into the lungs daily. 11/24/19   Salena Saner, MD  hydrOXYzine (ATARAX/VISTARIL) 50 MG tablet TAKE ONE TABLET BY MOUTH 3 TIMES A DAY AS NEEDED FOR ANXIETY 08/04/20 08/04/21  Jesse Sans, MD   Spacer/Aero-Holding Chambers (AEROCHAMBER PLUS) inhaler Use as instructed 07/08/19   Domenick Gong, MD  traZODone (DESYREL) 100 MG tablet TAKE ONE TABLET BY MOUTH AT BEDTIME AS NEEDED FOR SLEEP 08/04/20 08/04/21  Jesse Sans, MD  traZODone (DESYREL) 100 MG tablet TAKE 1 TABLET BY MOUTH AT BEDTIME AS NEEDED FOR SLEEP. 08/03/20 08/03/21  Jesse Sans, MD    Allergies Robitussin (alcohol free) [guaifenesin], Strawberry extract, Tramadol, Aspirin, Chocolate flavor, and Codeine  Family History  Problem Relation Age of Onset  . Asthma Mother   . Heart disease Mother   . Diabetes Mother   . Hyperlipidemia Mother   . Hypertension Mother   . Mental illness Mother   . Migraines Mother   . Thyroid disease Mother   . Stroke Mother   . Alcohol abuse Mother   . Bipolar disorder Mother   . Anxiety disorder Mother   . COPD Mother   . Hypertension Sister   . Depression Sister   . Drug abuse Sister   . Cancer Maternal Grandmother   . Diabetes Maternal Grandmother   . Heart disease Maternal Grandmother   . Hyperlipidemia Maternal Grandmother   . Hypertension Maternal Grandmother   . Kidney disease Maternal Grandmother   . Heart disease Paternal Grandfather   . Alcohol abuse Paternal Grandfather   . Drug abuse Paternal Grandfather     Social History Social History   Tobacco Use  . Smoking status: Current Every Day Smoker    Packs/day: 1.00    Years: 15.00    Pack years: 15.00    Types: Cigarettes    Start date: 09/07/2019    Last attempt to quit: 08/2019    Years since quitting: 1.1  . Smokeless tobacco: Never Used  . Tobacco comment: did not want to quit but can't inhale w/ coughing  Vaping Use  . Vaping Use: Never used  Substance Use Topics  . Alcohol use: No    Alcohol/week: 0.0 standard drinks  . Drug use: No    Review of Systems Constitutional: No fever/chills Eyes: No visual changes. ENT: No sore throat.  Positive for dental pain. Cardiovascular: Denies chest  pain. Respiratory: Denies shortness of breath. Gastrointestinal: No abdominal pain.  No nausea, no vomiting.   Musculoskeletal: Negative for muscle skeletal pain. Skin: Negative for rash. Neurological: Negative for headaches, focal weakness or numbness. ____________________________________________   PHYSICAL EXAM:  VITAL SIGNS: ED Triage Vitals  Enc Vitals Group     BP 10/10/20 0810 132/85     Pulse Rate 10/10/20 0810 (!) 101     Resp 10/10/20 0810 18     Temp 10/10/20 0810 98.2 F (36.8 C)     Temp Source 10/10/20 0810 Oral     SpO2 10/10/20 0810 100 %  Weight 10/10/20 0811 194 lb (88 kg)     Height 10/10/20 0811 5\' 5"  (1.651 m)     Head Circumference --      Peak Flow --      Pain Score 10/10/20 0811 7     Pain Loc --      Pain Edu? --      Excl. in GC? --     Constitutional: Alert and oriented. Well appearing and in no acute distress. Eyes: Conjunctivae are normal.  Head: Atraumatic. Nose: No congestion/rhinnorhea. Mouth/Throat: Mucous membranes are moist.  Oropharynx non-erythematous.  Right lower molar with large cavity present.  Gum is edematous around this tooth.  No active drainage is noted. Neck: No stridor.   Cardiovascular: Normal rate, regular rhythm. Grossly normal heart sounds.  Good peripheral circulation. Respiratory: Normal respiratory effort.  No retractions. Lungs CTAB. Musculoskeletal: There is upper and lower extremities that any difficulty normal gait was noted. Neurologic:  Normal speech and language. No gross focal neurologic deficits are appreciated. No gait instability. Skin:  Skin is warm, dry and intact. No rash noted. Psychiatric: Mood and affect are normal. Speech and behavior are normal.  ____________________________________________   LABS (all labs ordered are listed, but only abnormal results are displayed)  Labs Reviewed - No data to display  PROCEDURES  Procedure(s) performed (including Critical  Care):  Procedures   ____________________________________________   INITIAL IMPRESSION / ASSESSMENT AND PLAN / ED COURSE  As part of my medical decision making, I reviewed the following data within the electronic MEDICAL RECORD NUMBER Notes from prior ED visits and [place] Controlled Substance Database  35 year old female presents to the ED with dental pain.  Patient has some minimal facial swelling on the right.  There is a large dental carry noted on the right lower molar without active drainage at this time.  Areas extremely tender to palpation.  She was given a list of dental clinics in the area and encouraged to follow-up with 1 especially [facility] since that has walk-in hours.  A prescription for amoxicillin 875 twice daily for 10 days was sent to her pharmacy.  She is encouraged take Tylenol or ibuprofen as needed for pain.  ____________________________________________   FINAL CLINICAL IMPRESSION(S) / ED DIAGNOSES  Final diagnoses:  Pain due to dental caries     ED Discharge Orders         Ordered    amoxicillin (AMOXIL) 875 MG tablet  2 times daily        10/10/20 [id number]          *Please note:  Niel HummerHeather M Iseman was evaluated in Emergency Department on 10/10/2020 for the symptoms described in the history of present illness. She was evaluated in the context of the global COVID-19 pandemic, which necessitated consideration that the patient might be at risk for infection with the SARS-CoV-2 virus that causes COVID-19. Institutional protocols and algorithms that pertain to the evaluation of patients at risk for COVID-19 are in a state of rapid change based on information released by regulatory bodies including the CDC and federal and state organizations. These policies and algorithms were followed during the patient's care in the ED.  Some ED evaluations and interventions may be delayed as a result of limited staffing during and the pandemic.*   Note:  This  document was prepared using Dragon voice recognition software and may include unintentional dictation errors.    Tommi RumpsSummers, Abbygael Curtiss L, PA-C 10/10/20 [phone]  Jene Every, MD 10/10/20 [phone]

## 2020-10-10 NOTE — ED Notes (Signed)
See triage note  Presents with pain to right jaw line unsure if it her tooth  States pain started couple of days ago  No fever or trauma

## 2020-10-10 NOTE — ED Triage Notes (Signed)
Pt c/o right lower jaw and throat pain, states "I think I have an abscess tooth" for the past couple of days

## 2020-10-10 NOTE — Discharge Instructions (Addendum)
Call one of the dental clinics listed on your discharge papers and make arrangements to be seen.  Also the clinic at [facility] has walk-in hours.  Begin taking amoxicillin 875 twice daily for the next 10 days.  You may take Tylenol or ibuprofen with your medication if needed for pain.  Avoid smoking as this will make your tooth hurt worse at this time.

## 2020-10-28 ENCOUNTER — Telehealth: Payer: Self-pay | Admitting: Pharmacist

## 2020-10-28 NOTE — Telephone Encounter (Signed)
Patient failed to provide requested 2022 financial documentation. Unable to determine patient's eligibility status for [facility]. No additional medication assistance will be provided by [facility] without the required proof of income documentation. Patient notified by letter.  Vonda [facility] Administrative Assistant 

## 2020-11-04 ENCOUNTER — Emergency Department
Admission: EM | Admit: 2020-11-04 | Discharge: 2020-11-04 | Disposition: A | Discharge status: Home / Self Care | Payer: Medicaid Other | Attending: Emergency Medicine | Admitting: Emergency Medicine

## 2020-11-04 ENCOUNTER — Encounter: Payer: Self-pay | Admitting: Intensive Care

## 2020-11-04 ENCOUNTER — Other Ambulatory Visit: Payer: Self-pay

## 2020-11-04 DIAGNOSIS — J45909 Unspecified asthma, uncomplicated: Secondary | ICD-10-CM | POA: Insufficient documentation

## 2020-11-04 DIAGNOSIS — F17210 Nicotine dependence, cigarettes, uncomplicated: Secondary | ICD-10-CM | POA: Insufficient documentation

## 2020-11-04 DIAGNOSIS — Z20822 Contact with and (suspected) exposure to covid-19: Secondary | ICD-10-CM | POA: Insufficient documentation

## 2020-11-04 DIAGNOSIS — B349 Viral infection, unspecified: Secondary | ICD-10-CM | POA: Insufficient documentation

## 2020-11-04 LAB — RESP PANEL BY RT-PCR (FLU A&B, COVID) ARPGX2
Influenza A by PCR: NEGATIVE
Influenza B by PCR: NEGATIVE
SARS Coronavirus 2 by RT PCR: NEGATIVE

## 2020-11-04 MED ORDER — ONDANSETRON 4 MG PO TBDP: 4.0000 mg | ORAL_TABLET | Freq: Three times a day (TID) | ORAL | 0 refills | Status: DC | PRN

## 2020-11-04 MED ORDER — ONDANSETRON 4 MG PO TBDP
4.0000 mg | ORAL_TABLET | Freq: Once | ORAL | Status: AC
  Administered 2020-11-04: 4 mg via ORAL
  Filled 2020-11-04: qty 1

## 2020-11-04 NOTE — ED Notes (Signed)
See triage note  Presents with sore throat cough and n/v  States sx's started yesterday  Denies any fever

## 2020-11-04 NOTE — ED Provider Notes (Signed)
[facility] Emergency Department Provider Note  ____________________________________________   Event Date/Time   First MD Initiated Contact with Patient 11/04/20 [phone]     (approximate)  I have reviewed the triage vital signs and the nursing notes.   HISTORY  Chief Complaint Cough   HPI [name] is a 35 y.o. female to the ED with complaint of sore throat, cough, nausea and vomiting that started yesterday.  Patient is unaware of any fever.  Patient also has had some diarrhea today.  Patient is unaware of any known COVID exposure.  Currently she rates her pain as 3/10.         Past Medical History:  Diagnosis Date  . Allergy   . Anxiety   . Asthma 11/24/2019   blood allergy testing done 11/24/19 to see what causes it to flare up  . Bipolar 1 disorder (HCC)   . Bipolar disorder (HCC)   . Chronic cough    being worked up with Dr. Jayme Cloud  . Depression   . Dyspareunia in female 11/2019  . Dysrhythmia    TACHY BUT NOT A PROBLEM  . Ectopic pregnancy    had surgery  . GERD (gastroesophageal reflux disease)   . History of concussion 2016  . Hyperlipidemia   . Migraine    has an aura. happening frequently  . MRSA (methicillin resistant Staphylococcus aureus)    was on chin.  many years ago  . NSVD (normal spontaneous vaginal delivery)    x 2  . Poor dentition   . Pseudoseizures (HCC)    related to anxiety  . Scoliosis   . Scoliosis     Patient Active Problem List   Diagnosis Date Noted  . Generalized anxiety disorder 08/03/2020  . Insomnia due to mental condition 08/03/2020  . S/P laparoscopic hysterectomy 12/25/2019  . Pelvic pain 11/27/2019  . Dyspareunia due to medical condition in female 11/27/2019  . Dysmenorrhea 11/27/2019  . Radial styloid tenosynovitis 10/13/2019  . Bipolar affective disorder (HCC) 01/26/2015  . Seizures (HCC) 01/12/2015  . Asthma   . Hyperlipidemia   . Bipolar 2 disorder (HCC)   . Allergy   .  Migraine headache with aura     Past Surgical History:  Procedure Laterality Date  . CARPAL TUNNEL RELEASE  2015   left  . CYSTOSCOPY  11/27/2019   Procedure: CYSTOSCOPY;  Surgeon: Nadara Mustard, MD;  Location: [facility] ORS;  Service: Gynecology;;  . ECTOPIC PREGNANCY SURGERY  2011  . HERNIA REPAIR  2010  . TOTAL LAPAROSCOPIC HYSTERECTOMY WITH SALPINGECTOMY Bilateral 11/27/2019   Procedure: TOTAL LAPAROSCOPIC HYSTERECTOMY WITH SALPINGECTOMY;  Surgeon: Nadara Mustard, MD;  Location: [facility] ORS;  Service: Gynecology;  Laterality: Bilateral;  needs RNFA  . TUBAL LIGATION      Prior to Admission medications   Medication Sig Start Date End Date Taking? Authorizing Provider  ondansetron (ZOFRAN ODT) 4 MG disintegrating tablet Take 1 tablet (4 mg total) by mouth every 8 (eight) hours as needed for nausea or vomiting. 11/04/20  Yes Bridget Hartshorn L, PA-C  albuterol (VENTOLIN HFA) 108 (90 Base) MCG/ACT inhaler Inhale 2 puffs into the lungs every 4 (four) hours as needed for wheezing or shortness of breath. 07/08/19   Domenick Gong, MD  ARIPiprazole (ABILIFY) 5 MG tablet TAKE ONE TABLET BY MOUTH EVERY DAY 08/04/20 03/24/21  Jesse Sans, MD  ARIPiprazole (ABILIFY) 5 MG tablet TAKE 1 TABLET BY MOUTH DAILY 08/03/20 08/03/21  Jesse Sans, MD  divalproex (DEPAKOTE ER) 500 MG 24 hr tablet TAKE ONE TABLET BY MOUTH AT BEDTIME 08/04/20 12/22/20  Jesse Sans, MD  divalproex (DEPAKOTE ER) 500 MG 24 hr tablet TAKE 1 TABLET BY MOUTH AT BEDTIME FOR 7 DAYS. 08/03/20 08/03/21  Jesse Sans, MD  FLUoxetine (PROZAC) 40 MG capsule TAKE ONE CAPSULE BY MOUTH EVERY DAY 08/04/20 08/04/21  Jesse Sans, MD  FLUoxetine (PROZAC) 40 MG capsule TAKE 1 CAPSULE BY MOUTH DAILY 08/03/20 08/03/21  Jesse Sans, MD  Fluticasone-Umeclidin-Vilant (TRELEGY ELLIPTA) 200-62.5-25 MCG/INH AEPB Inhale 1 puff into the lungs daily. 11/24/19   Salena Saner, MD  hydrOXYzine (ATARAX/VISTARIL) 50 MG tablet TAKE ONE TABLET BY MOUTH 3  TIMES A DAY AS NEEDED FOR ANXIETY 08/04/20 08/04/21  Jesse Sans, MD  Spacer/Aero-Holding Chambers (AEROCHAMBER PLUS) inhaler Use as instructed 07/08/19   Domenick Gong, MD  traZODone (DESYREL) 100 MG tablet TAKE ONE TABLET BY MOUTH AT BEDTIME AS NEEDED FOR SLEEP 08/04/20 08/04/21  Jesse Sans, MD  traZODone (DESYREL) 100 MG tablet TAKE 1 TABLET BY MOUTH AT BEDTIME AS NEEDED FOR SLEEP. 08/03/20 08/03/21  Jesse Sans, MD    Allergies Robitussin (alcohol free) [guaifenesin], Strawberry extract, Tramadol, Aspirin, Chocolate flavor, and Codeine  Family History  Problem Relation Age of Onset  . Asthma Mother   . Heart disease Mother   . Diabetes Mother   . Hyperlipidemia Mother   . Hypertension Mother   . Mental illness Mother   . Migraines Mother   . Thyroid disease Mother   . Stroke Mother   . Alcohol abuse Mother   . Bipolar disorder Mother   . Anxiety disorder Mother   . COPD Mother   . Hypertension Sister   . Depression Sister   . Drug abuse Sister   . Cancer Maternal Grandmother   . Diabetes Maternal Grandmother   . Heart disease Maternal Grandmother   . Hyperlipidemia Maternal Grandmother   . Hypertension Maternal Grandmother   . Kidney disease Maternal Grandmother   . Heart disease Paternal Grandfather   . Alcohol abuse Paternal Grandfather   . Drug abuse Paternal Grandfather     Social History Social History   Tobacco Use  . Smoking status: Current Every Day Smoker    Packs/day: 1.00    Years: 15.00    Pack years: 15.00    Types: Cigarettes    Start date: 09/07/2019    Last attempt to quit: 08/2019    Years since quitting: 1.2  . Smokeless tobacco: Never Used  . Tobacco comment: did not want to quit but can't inhale w/ coughing  Vaping Use  . Vaping Use: Never used  Substance Use Topics  . Alcohol use: No    Alcohol/week: 0.0 standard drinks  . Drug use: No    Review of Systems Constitutional: Subjective fever/chills Eyes: No visual  changes. ENT: No sore throat. Cardiovascular: Denies chest pain. Respiratory: Denies shortness of breath.  Positive cough. Gastrointestinal: No abdominal pain.  Positive nausea, positive vomiting.  Positive diarrhea.  No constipation. Genitourinary: Negative for dysuria. Musculoskeletal: Positive muscle skeletal pain. Skin: Negative for rash. Neurological: Positive for headaches.  Negative for focal weakness or numbness. ____________________________________________   PHYSICAL EXAM:  VITAL SIGNS: ED Triage Vitals  Enc Vitals Group     BP 11/04/20 0710 105/64     Pulse Rate 11/04/20 0710 92     Resp 11/04/20 0710 18     Temp 11/04/20 0710 98 F (36.7 C)  Temp Source 11/04/20 0710 Oral     SpO2 11/04/20 0710 97 %     Weight 11/04/20 0711 194 lb (88 kg)     Height 11/04/20 0711 5\' 5"  (1.651 m)     Head Circumference --      Peak Flow --      Pain Score 11/04/20 0710 3     Pain Loc --      Pain Edu? --      Excl. in GC? --     Constitutional: Alert and oriented. Well appearing and in no acute distress. Eyes: Conjunctivae are normal. PERRL. EOMI. Head: Atraumatic. Nose: Mild congestion/rhinnorhea. Neck: No stridor.   Cardiovascular: Normal rate, regular rhythm. Grossly normal heart sounds.  Good peripheral circulation. Respiratory: Normal respiratory effort.  No retractions. Lungs CTAB. Gastrointestinal: Soft and nontender. No distention.  Sounds normoactive x4 quadrants. Musculoskeletal: No lower extremity tenderness nor edema. Neurologic:  Normal speech and language. No gross focal neurologic deficits are appreciated. No gait instability. Skin:  Skin is warm, dry and intact. No rash noted. Psychiatric: Mood and affect are normal. Speech and behavior are normal.  ____________________________________________   LABS (all labs ordered are listed, but only abnormal results are displayed)  Labs Reviewed  RESP PANEL BY RT-PCR (FLU A&B, COVID) [id number]      PROCEDURES  Procedure(s) performed (including Critical Care):  Procedures   ____________________________________________   INITIAL IMPRESSION / ASSESSMENT AND PLAN / ED COURSE  As part of my medical decision making, I reviewed the following data within the electronic MEDICAL RECORD NUMBER Notes from prior ED visits and [place] Controlled Substance Database  35 year old female presents to the ED with complaint of chills, cough, nausea, vomiting and diarrhea.  Symptoms began yesterday.  Patient is unaware of any known COVID exposure.  Respiratory panel was negative for COVID and influenza.  Patient was reassured.  She most likely has a viral illness.  She is encouraged to drink fluids, Tylenol as needed for headache or pain.  A prescription for Zofran was sent to her pharmacy with instructions to take every 8 hours as needed for nausea and vomiting.  Follow-up with her PCP if any continued problems.  ____________________________________________   FINAL CLINICAL IMPRESSION(S) / ED DIAGNOSES  Final diagnoses:  Viral illness     ED Discharge Orders         Ordered    ondansetron (ZOFRAN ODT) 4 MG disintegrating tablet  Every 8 hours PRN        11/04/20 0908          *Please note:  TAKYRA CANTRALL was evaluated in Emergency Department on 11/04/2020 for the symptoms described in the history of present illness. She was evaluated in the context of the global COVID-19 pandemic, which necessitated consideration that the patient might be at risk for infection with the SARS-CoV-2 virus that causes COVID-19. Institutional protocols and algorithms that pertain to the evaluation of patients at risk for COVID-19 are in a state of rapid change based on information released by regulatory bodies including the CDC and federal and state organizations. These policies and algorithms were followed during the patient's care in the ED.  Some ED evaluations and interventions may be delayed as a result of  limited staffing during and the pandemic.*   Note:  This document was prepared using Dragon voice recognition software and may include unintentional dictation errors.    11/06/2020, PA-C 11/04/20 1232    11/06/20, MD 11/05/20 2049

## 2020-11-04 NOTE — ED Triage Notes (Signed)
Patient c/o chills, cough, diarrhea, headache and emesis that started yesterday. NAD noted.

## 2020-11-04 NOTE — Discharge Instructions (Addendum)
Follow-up with your primary care provider if any continued problems or concerns.  Clear liquids for the next 24 hours and then if no continued diarrhea you may advance your diet slowly to bananas, rice, applesauce and plain toast.  You may take Tylenol or ibuprofen as needed for body aches, headache or fever.  A prescription for Zofran was sent to the pharmacy as needed for nausea and vomiting this is a tablet that you put on your tongue and it will dissolve.

## 2020-12-01 ENCOUNTER — Telehealth (INDEPENDENT_AMBULATORY_CARE_PROVIDER_SITE_OTHER): Payer: Medicaid Other | Admitting: Unknown Physician Specialty

## 2020-12-01 ENCOUNTER — Other Ambulatory Visit: Payer: Self-pay

## 2020-12-01 ENCOUNTER — Encounter: Payer: Self-pay | Admitting: Unknown Physician Specialty

## 2020-12-01 DIAGNOSIS — K047 Periapical abscess without sinus: Secondary | ICD-10-CM

## 2020-12-01 MED ORDER — AMOXICILLIN 875 MG PO TABS: 875.0000 mg | ORAL_TABLET | Freq: Two times a day (BID) | ORAL | 0 refills | Status: DC

## 2020-12-01 NOTE — Progress Notes (Signed)
   LMP 11/09/2019    Subjective:    Patient ID: [name], female    DOB: 19-Feb-1986, 35 y.o.   MRN: [id number]  HPI: [name] is a 35 y.o. female  Chief Complaint  Patient presents with   Covid Exposure   Facial Swelling   Due to the catastrophic nature of the COVID-19 pandemic, this visit was completed via audio and visual contact via [facility] due to the restrictions of the COVID-19 pandemic. All issues as above were discussed and addressed. Physical exam was done as above through visual confirmation on [facility] If it was felt that the patient should be evaluated in the office, they were directed there. The patient verbally consented to this visit."} Location of the patient: home Location of the provider: work Those involved with this call:  Provider: Gabriel Cirri, DNP  Time spent on call: 10 minutes with a 5 minute chart review I verified patient identity using two factors (patient name and date of birth). Patient consents verbally to being seen via telemedicine visit today.  Pt states she was exposed to Covid 4 days ago and had no signs and symptoms.  Right side of the mouth with facial swelling.  Poor dentition and does not have a dentist.  She received Amoxil in the past.  No fever or chills.    Relevant past medical, surgical, family and social history reviewed and updated as indicated. Interim medical history since our last visit reviewed. Allergies and medications reviewed and updated.  Review of Systems  Per HPI unless specifically indicated above     Objective:    LMP 11/09/2019   Wt Readings from Last 3 Encounters:  11/04/20 194 lb (88 kg)  10/10/20 194 lb (88 kg)  08/02/20 178 lb (80.7 kg)    Physical Exam Constitutional:      General: She is not in acute distress.    Appearance: Normal appearance. She is well-developed.  HENT:     Head: Normocephalic and atraumatic.     Comments: Pt with right sided facial swelling.  Poor  dentition Eyes:     General: Lids are normal. No scleral icterus.       Right eye: No discharge.        Left eye: No discharge.     Conjunctiva/sclera: Conjunctivae normal.  Cardiovascular:     Rate and Rhythm: Normal rate.  Pulmonary:     Effort: Pulmonary effort is normal.  Abdominal:     Palpations: There is no hepatomegaly or splenomegaly.  Musculoskeletal:        General: Normal range of motion.  Skin:    Coloration: Skin is not pale.     Findings: No rash.  Neurological:     Mental Status: She is alert and oriented to person, place, and time.  Psychiatric:        Behavior: Behavior normal.        Thought Content: Thought content normal.        Judgment: Judgment normal.     Assessment & Plan:   Problem List Items Addressed This Visit   None Visit Diagnoses     Tooth abscess    -  Primary   Rx for amoxil.  Saline rinses.  Information given to the [facility] dental school and discussed it is very important to get dental care ASAP        Follow up plan: Return if symptoms worsen or fail to improve.

## 2020-12-01 NOTE — Patient Instructions (Signed)
[facility] dental clinic  We are an appointment only free dental clinic.  English Appointment Request form: [email or web address]  Spanish Appointment Request form: [email or web address]  OR please call  [phone] and leave a voicemail.

## 2020-12-08 ENCOUNTER — Other Ambulatory Visit: Payer: Self-pay

## 2020-12-08 ENCOUNTER — Encounter: Payer: Self-pay | Admitting: Emergency Medicine

## 2020-12-08 ENCOUNTER — Emergency Department
Admission: EM | Admit: 2020-12-08 | Discharge: 2020-12-08 | Disposition: A | Discharge status: Home / Self Care | Payer: Medicaid Other | Attending: Emergency Medicine | Admitting: Emergency Medicine

## 2020-12-08 DIAGNOSIS — J45909 Unspecified asthma, uncomplicated: Secondary | ICD-10-CM | POA: Insufficient documentation

## 2020-12-08 DIAGNOSIS — F17210 Nicotine dependence, cigarettes, uncomplicated: Secondary | ICD-10-CM | POA: Insufficient documentation

## 2020-12-08 DIAGNOSIS — U071 COVID-19: Secondary | ICD-10-CM

## 2020-12-08 DIAGNOSIS — Z7951 Long term (current) use of inhaled steroids: Secondary | ICD-10-CM | POA: Insufficient documentation

## 2020-12-08 MED ORDER — FEXOFENADINE-PSEUDOEPHED ER 60-120 MG PO TB12: 1.0000 | ORAL_TABLET | Freq: Two times a day (BID) | ORAL | 0 refills | Status: DC

## 2020-12-08 MED ORDER — IBUPROFEN 800 MG PO TABS
800.0000 mg | ORAL_TABLET | Freq: Three times a day (TID) | ORAL | 0 refills | Status: DC | PRN
Start: 2020-12-08 — End: 2021-02-13

## 2020-12-08 MED ORDER — BENZONATATE 100 MG PO CAPS: 200.0000 mg | ORAL_CAPSULE | Freq: Three times a day (TID) | ORAL | 0 refills | Status: DC | PRN

## 2020-12-08 NOTE — ED Provider Notes (Signed)
[facility] Emergency Department Provider Note   ____________________________________________   Event Date/Time   First MD Initiated Contact with Patient 12/08/20 [phone]     (approximate)  I have reviewed the triage vital signs and the nursing notes.   HISTORY  Chief Complaint URI (/)    HPI [name] is a 35 y.o. female patient presents with cough, body aches, fatigue which started this morning.  States her son tested positive for COVID-19 earlier this week.  Patient states she took a home test today and it was positive.  Patient has taken the vaccine but no booster.  Rates her pain discomfort as 2/10.  Patient states feels fatigued.  No palliative measure for complaint.         Past Medical History:  Diagnosis Date   Allergy    Anxiety    Asthma 11/24/2019   blood allergy testing done 11/24/19 to see what causes it to flare up   Bipolar 1 disorder (HCC)    Bipolar disorder (HCC)    Chronic cough    being worked up with Dr. Jayme Cloud   Depression    Dyspareunia in female 11/2019   Dysrhythmia    TACHY BUT NOT A PROBLEM   Ectopic pregnancy    had surgery   GERD (gastroesophageal reflux disease)    History of concussion 2016   Hyperlipidemia    Migraine    has an aura. happening frequently   MRSA (methicillin resistant Staphylococcus aureus)    was on chin.  many years ago   NSVD (normal spontaneous vaginal delivery)    x 2   Poor dentition    Pseudoseizures (HCC)    related to anxiety   Scoliosis    Scoliosis     Patient Active Problem List   Diagnosis Date Noted   Generalized anxiety disorder 08/03/2020   Insomnia due to mental condition 08/03/2020   S/P laparoscopic hysterectomy 12/25/2019   Pelvic pain 11/27/2019   Dyspareunia due to medical condition in female 11/27/2019   Dysmenorrhea 11/27/2019   Radial styloid tenosynovitis 10/13/2019   Bipolar affective disorder (HCC) 01/26/2015   Seizures (HCC) 01/12/2015    Asthma    Hyperlipidemia    Bipolar 2 disorder (HCC)    Allergy    Migraine headache with aura     Past Surgical History:  Procedure Laterality Date   ABDOMINAL HYSTERECTOMY     CARPAL TUNNEL RELEASE  2015   left   CYSTOSCOPY  11/27/2019   Procedure: CYSTOSCOPY;  Surgeon: Nadara Mustard, MD;  Location: [facility] ORS;  Service: Gynecology;;   ECTOPIC PREGNANCY SURGERY  2011   HERNIA REPAIR  2010   TOTAL LAPAROSCOPIC HYSTERECTOMY WITH SALPINGECTOMY Bilateral 11/27/2019   Procedure: TOTAL LAPAROSCOPIC HYSTERECTOMY WITH SALPINGECTOMY;  Surgeon: Nadara Mustard, MD;  Location: [facility] ORS;  Service: Gynecology;  Laterality: Bilateral;  needs RNFA   TUBAL LIGATION      Prior to Admission medications   Medication Sig Start Date End Date Taking? Authorizing Provider  benzonatate (TESSALON PERLES) 100 MG capsule Take 2 capsules (200 mg total) by mouth 3 (three) times daily as needed. 12/08/20 12/08/21 Yes Joni Reining, PA-C  fexofenadine-pseudoephedrine (ALLEGRA-D) 60-120 MG 12 hr tablet Take 1 tablet by mouth 2 (two) times daily. 12/08/20  Yes Joni Reining, PA-C  ibuprofen (ADVIL) 800 MG tablet Take 1 tablet (800 mg total) by mouth every 8 (eight) hours as needed. 12/08/20  Yes Joni Reining, PA-C  albuterol (  VENTOLIN HFA) 108 (90 Base) MCG/ACT inhaler Inhale 2 puffs into the lungs every 4 (four) hours as needed for wheezing or shortness of breath. 07/08/19   Domenick Gong, MD  ARIPiprazole (ABILIFY) 5 MG tablet TAKE ONE TABLET BY MOUTH EVERY DAY 08/04/20 03/24/21  Jesse Sans, MD  ARIPiprazole (ABILIFY) 5 MG tablet TAKE 1 TABLET BY MOUTH DAILY Patient not taking: Reported on 12/01/2020 08/03/20 08/03/21  Jesse Sans, MD  divalproex (DEPAKOTE ER) 500 MG 24 hr tablet TAKE ONE TABLET BY MOUTH AT BEDTIME Patient not taking: Reported on 12/01/2020 08/04/20 12/22/20  Jesse Sans, MD  divalproex (DEPAKOTE ER) 500 MG 24 hr tablet TAKE 1 TABLET BY MOUTH AT BEDTIME FOR 7 DAYS. Patient not  taking: Reported on 12/01/2020 08/03/20 08/03/21  Jesse Sans, MD  FLUoxetine (PROZAC) 40 MG capsule TAKE ONE CAPSULE BY MOUTH EVERY DAY 08/04/20 08/04/21  Jesse Sans, MD  FLUoxetine (PROZAC) 40 MG capsule TAKE 1 CAPSULE BY MOUTH DAILY Patient not taking: Reported on 12/01/2020 08/03/20 08/03/21  Jesse Sans, MD  Fluticasone-Umeclidin-Vilant (TRELEGY ELLIPTA) 200-62.5-25 MCG/INH AEPB Inhale 1 puff into the lungs daily. 11/24/19   Salena Saner, MD  hydrOXYzine (ATARAX/VISTARIL) 50 MG tablet TAKE ONE TABLET BY MOUTH 3 TIMES A DAY AS NEEDED FOR ANXIETY 08/04/20 08/04/21  Jesse Sans, MD  ondansetron (ZOFRAN ODT) 4 MG disintegrating tablet Take 1 tablet (4 mg total) by mouth every 8 (eight) hours as needed for nausea or vomiting. 11/04/20   Tommi Rumps, PA-C  Oxcarbazepine (TRILEPTAL) 300 MG tablet Take 300 mg by mouth 2 (two) times daily. 10/31/20   [provider]  Spacer/Aero-Holding Chambers (AEROCHAMBER PLUS) inhaler Use as instructed Patient not taking: Reported on 12/01/2020 07/08/19   Domenick Gong, MD  traZODone (DESYREL) 100 MG tablet TAKE ONE TABLET BY MOUTH AT BEDTIME AS NEEDED FOR SLEEP 08/04/20 08/04/21  Jesse Sans, MD    Allergies Robitussin (alcohol free) [guaifenesin], Strawberry extract, Tramadol, Aspirin, Chocolate flavor, and Codeine  Family History  Problem Relation Age of Onset   Asthma Mother    Heart disease Mother    Diabetes Mother    Hyperlipidemia Mother    Hypertension Mother    Mental illness Mother    Migraines Mother    Thyroid disease Mother    Stroke Mother    Alcohol abuse Mother    Bipolar disorder Mother    Anxiety disorder Mother    COPD Mother    Hypertension Sister    Depression Sister    Drug abuse Sister    Cancer Maternal Grandmother    Diabetes Maternal Grandmother    Heart disease Maternal Grandmother    Hyperlipidemia Maternal Grandmother    Hypertension Maternal Grandmother    Kidney disease Maternal  Grandmother    Heart disease Paternal Grandfather    Alcohol abuse Paternal Grandfather    Drug abuse Paternal Grandfather     Social History Social History   Tobacco Use   Smoking status: Every Day    Packs/day: 1.00    Years: 15.00    Pack years: 15.00    Types: Cigarettes    Start date: 09/07/2019    Last attempt to quit: 09/23/2019    Years since quitting: 1.2   Smokeless tobacco: Never   Tobacco comments:    did not want to quit but can't inhale w/ coughing  Vaping Use   Vaping Use: Never used  Substance Use Topics   Alcohol use: No  Alcohol/week: 0.0 standard drinks   Drug use: No    Review of Systems Constitutional: No fever/chills.  Body aches. Eyes: No visual changes. ENT: No sore throat. Cardiovascular: Denies chest pain. Respiratory: Denies shortness of breath.  Cough Gastrointestinal: No abdominal pain.  No nausea, no vomiting.  No diarrhea.  No constipation. Genitourinary: Negative for dysuria. Musculoskeletal: Negative for back pain. Skin: Negative for rash. Neurological: Negative for headaches, focal weakness or numbness. Psychiatric: Anxiety and depression. Endocrine: Hyperlipidemia. Hematological/Lymphatic:  Allergic/Immunilogical: Aspirin, codeine, chocolate flavors, Robitussin, strawberry, and tramadol.  ___________________________________________   PHYSICAL EXAM:  VITAL SIGNS: ED Triage Vitals  Enc Vitals Group     BP 12/08/20 0913 119/71     Pulse Rate 12/08/20 0913 99     Resp 12/08/20 0913 18     Temp 12/08/20 0913 98.2 F (36.8 C)     Temp Source 12/08/20 0913 Oral     SpO2 12/08/20 0913 97 %     Weight 12/08/20 0920 174 lb (78.9 kg)     Height 12/08/20 0920 5\' 5"  (1.651 m)     Head Circumference --      Peak Flow --      Pain Score 12/08/20 0920 2     Pain Loc --      Pain Edu? --      Excl. in GC? --     Constitutional: Alert and oriented. Well appearing and in no acute distress. Eyes: Conjunctivae are normal. PERRL.  EOMI. Head: Atraumatic. Nose: No congestion/rhinnorhea. Mouth/Throat: Mucous membranes are moist.  Oropharynx non-erythematous. Neck: No stridor.   Cardiovascular: Normal rate, regular rhythm. Grossly normal heart sounds.  Good peripheral circulation. Respiratory: Normal respiratory effort.  No retractions. Lungs CTAB.  Nonproductive cough. Gastrointestinal: Soft and nontender. No distention. No abdominal bruits. No CVA tenderness. Genitourinary: Deferred Musculoskeletal: No lower extremity tenderness nor edema.  No joint effusions. Neurologic:  Normal speech and language. No gross focal neurologic deficits are appreciated. No gait instability. Skin:  Skin is warm, dry and intact. No rash noted. Psychiatric: Mood and affect are normal. Speech and behavior are normal.  ____________________________________________   LABS (all labs ordered are listed, but only abnormal results are displayed)  Labs Reviewed - No data to display ____________________________________________  EKG   ____________________________________________  RADIOLOGY I, 12/10/20, personally viewed and evaluated these images (plain radiographs) as part of my medical decision making, as well as reviewing the written report by the radiologist.  ED MD interpretation:    Official radiology report(s): No results found.  ____________________________________________   PROCEDURES  Procedure(s) performed (including Critical Care):  Procedures   ____________________________________________   INITIAL IMPRESSION / ASSESSMENT AND PLAN / ED COURSE  As part of my medical decision making, I reviewed the following data within the electronic MEDICAL RECORD NUMBER         Patient presents with headache, cough, body aches status post exposure from family member with COVID-19.  Patient home tested positive today.  Patient complaining physical exam is consistent with viral illness.  Patient given discharge care  instruction advised take medication as directed.  Return to ED if condition worsens.      ____________________________________________   FINAL CLINICAL IMPRESSION(S) / ED DIAGNOSES  Final diagnoses:  COVID-19     ED Discharge Orders          Ordered    benzonatate (TESSALON PERLES) 100 MG capsule  3 times daily PRN        12/08/20 0940  fexofenadine-pseudoephedrine (ALLEGRA-D) 60-120 MG 12 hr tablet  2 times daily        12/08/20 0940    ibuprofen (ADVIL) 800 MG tablet  Every 8 hours PRN        12/08/20 0940             Note:  This document was prepared using Dragon voice recognition software and may include unintentional dictation errors.    Joni ReiningSmith, [place], PA-C 12/08/20 [id number]    Chesley NoonJessup, Charles, MD 12/08/20 1149

## 2020-12-08 NOTE — Discharge Instructions (Addendum)
Read and follow discharge care instruction.  Take medication as directed. 

## 2020-12-08 NOTE — ED Triage Notes (Signed)
Presents with h/a  cough and some body aches  states sx's started this am  states her some was positive for COVID  she tested this am at home and is positive

## 2021-02-12 ENCOUNTER — Other Ambulatory Visit: Payer: Self-pay

## 2021-02-12 ENCOUNTER — Emergency Department: Payer: Medicaid Other

## 2021-02-12 DIAGNOSIS — M25561 Pain in right knee: Secondary | ICD-10-CM | POA: Insufficient documentation

## 2021-02-12 DIAGNOSIS — J069 Acute upper respiratory infection, unspecified: Secondary | ICD-10-CM | POA: Insufficient documentation

## 2021-02-12 DIAGNOSIS — R52 Pain, unspecified: Secondary | ICD-10-CM

## 2021-02-12 DIAGNOSIS — J45909 Unspecified asthma, uncomplicated: Secondary | ICD-10-CM | POA: Insufficient documentation

## 2021-02-12 DIAGNOSIS — W108XXA Fall (on) (from) other stairs and steps, initial encounter: Secondary | ICD-10-CM | POA: Insufficient documentation

## 2021-02-12 DIAGNOSIS — Z79899 Other long term (current) drug therapy: Secondary | ICD-10-CM | POA: Insufficient documentation

## 2021-02-12 DIAGNOSIS — Z7951 Long term (current) use of inhaled steroids: Secondary | ICD-10-CM | POA: Insufficient documentation

## 2021-02-12 DIAGNOSIS — F17210 Nicotine dependence, cigarettes, uncomplicated: Secondary | ICD-10-CM | POA: Insufficient documentation

## 2021-02-12 NOTE — ED Triage Notes (Signed)
Patient reports she slipped coming down her steps and now with right knee pain.

## 2021-02-13 ENCOUNTER — Emergency Department
Admission: EM | Admit: 2021-02-13 | Discharge: 2021-02-13 | Disposition: A | Discharge status: Home / Self Care | Payer: Medicaid Other | Attending: Emergency Medicine | Admitting: Emergency Medicine

## 2021-02-13 DIAGNOSIS — M25561 Pain in right knee: Secondary | ICD-10-CM

## 2021-02-13 DIAGNOSIS — R52 Pain, unspecified: Secondary | ICD-10-CM

## 2021-02-13 MED ORDER — HYDROCODONE-ACETAMINOPHEN 5-325 MG PO TABS
2.0000 | ORAL_TABLET | Freq: Once | ORAL | Status: AC
Start: 2021-02-13 — End: 2021-02-13
  Administered 2021-02-13: 2 via ORAL
  Filled 2021-02-13: qty 2

## 2021-02-13 MED ORDER — HYDROCODONE-ACETAMINOPHEN 5-325 MG PO TABS: 2.0000 | ORAL_TABLET | Freq: Four times a day (QID) | ORAL | 0 refills | Status: DC | PRN

## 2021-02-13 MED ORDER — IBUPROFEN 800 MG PO TABS
800.0000 mg | ORAL_TABLET | Freq: Once | ORAL | Status: AC
  Administered 2021-02-13: 800 mg via ORAL
  Filled 2021-02-13: qty 1

## 2021-02-13 MED ORDER — ONDANSETRON 4 MG PO TBDP: 4.0000 mg | ORAL_TABLET | Freq: Four times a day (QID) | ORAL | 0 refills | Status: DC | PRN

## 2021-02-13 MED ORDER — IBUPROFEN 800 MG PO TABS: 800.0000 mg | ORAL_TABLET | Freq: Three times a day (TID) | ORAL | 0 refills | Status: DC | PRN

## 2021-02-13 NOTE — Discharge Instructions (Addendum)

## 2021-02-13 NOTE — ED Provider Notes (Signed)
[facility] Emergency Department Provider Note ____________________________________________   Event Date/Time   First MD Initiated Contact with Patient 02/13/21 0010     (approximate)  I have reviewed the triage vital signs and the nursing notes.   HISTORY  Chief Complaint Knee Pain    HPI [name] is a 35 y.o. female with history of asthma, pseudoseizures, migraines, hyperlipidemia who presents to the emergency department with complaints of right knee pain.  States she slipped on stairs just prior to arrival and twisted her right knee.  Denies any other injury.  No head injury or loss of consciousness.  No neck or back pain.  No chest or abdominal pain.  She is able to ambulate but states it is painful.         Past Medical History:  Diagnosis Date   Allergy    Anxiety    Asthma 11/24/2019   blood allergy testing done 11/24/19 to see what causes it to flare up   Bipolar 1 disorder (HCC)    Bipolar disorder (HCC)    Chronic cough    being worked up with Dr. Jayme Cloud   Depression    Dyspareunia in female 11/2019   Dysrhythmia    TACHY BUT NOT A PROBLEM   Ectopic pregnancy    had surgery   GERD (gastroesophageal reflux disease)    History of concussion 2016   Hyperlipidemia    Migraine    has an aura. happening frequently   MRSA (methicillin resistant Staphylococcus aureus)    was on chin.  many years ago   NSVD (normal spontaneous vaginal delivery)    x 2   Poor dentition    Pseudoseizures (HCC)    related to anxiety   Scoliosis    Scoliosis     Patient Active Problem List   Diagnosis Date Noted   Generalized anxiety disorder 08/03/2020   Insomnia due to mental condition 08/03/2020   S/P laparoscopic hysterectomy 12/25/2019   Pelvic pain 11/27/2019   Dyspareunia due to medical condition in female 11/27/2019   Dysmenorrhea 11/27/2019   Radial styloid tenosynovitis 10/13/2019   Bipolar affective disorder (HCC) 01/26/2015    Seizures (HCC) 01/12/2015   Asthma    Hyperlipidemia    Bipolar 2 disorder (HCC)    Allergy    Migraine headache with aura     Past Surgical History:  Procedure Laterality Date   ABDOMINAL HYSTERECTOMY     CARPAL TUNNEL RELEASE  2015   left   CYSTOSCOPY  11/27/2019   Procedure: CYSTOSCOPY;  Surgeon: Nadara Mustard, MD;  Location: [facility] ORS;  Service: Gynecology;;   ECTOPIC PREGNANCY SURGERY  2011   HERNIA REPAIR  2010   TOTAL LAPAROSCOPIC HYSTERECTOMY WITH SALPINGECTOMY Bilateral 11/27/2019   Procedure: TOTAL LAPAROSCOPIC HYSTERECTOMY WITH SALPINGECTOMY;  Surgeon: Nadara Mustard, MD;  Location: [facility] ORS;  Service: Gynecology;  Laterality: Bilateral;  needs RNFA   TUBAL LIGATION      Prior to Admission medications   Medication Sig Start Date End Date Taking? Authorizing Provider  HYDROcodone-acetaminophen (NORCO/VICODIN) 5-325 MG tablet Take 2 tablets by mouth every 6 (six) hours as needed. 02/13/21  Yes Delise Simenson, Layla Maw, DO  ibuprofen (ADVIL) 800 MG tablet Take 1 tablet (800 mg total) by mouth every 8 (eight) hours as needed for mild pain. 02/13/21  Yes Johncharles Fusselman, Baxter Hire N, DO  ondansetron (ZOFRAN ODT) 4 MG disintegrating tablet Take 1 tablet (4 mg total) by mouth every 6 (six) hours as  needed for nausea or vomiting. 02/13/21  Yes Chiann Goffredo, Baxter Hire N, DO  albuterol (VENTOLIN HFA) 108 (90 Base) MCG/ACT inhaler Inhale 2 puffs into the lungs every 4 (four) hours as needed for wheezing or shortness of breath. 07/08/19   Domenick Gong, MD  ARIPiprazole (ABILIFY) 5 MG tablet TAKE ONE TABLET BY MOUTH EVERY DAY 08/04/20 03/24/21  Jesse Sans, MD  ARIPiprazole (ABILIFY) 5 MG tablet TAKE 1 TABLET BY MOUTH DAILY Patient not taking: Reported on 12/01/2020 08/03/20 08/03/21  Jesse Sans, MD  benzonatate (TESSALON PERLES) 100 MG capsule Take 2 capsules (200 mg total) by mouth 3 (three) times daily as needed. 12/08/20 12/08/21  Joni Reining, PA-C  divalproex (DEPAKOTE ER) 500 MG 24 hr tablet TAKE ONE  TABLET BY MOUTH AT BEDTIME Patient not taking: Reported on 12/01/2020 08/04/20 12/22/20  Jesse Sans, MD  divalproex (DEPAKOTE ER) 500 MG 24 hr tablet TAKE 1 TABLET BY MOUTH AT BEDTIME FOR 7 DAYS. Patient not taking: Reported on 12/01/2020 08/03/20 08/03/21  Jesse Sans, MD  fexofenadine-pseudoephedrine (ALLEGRA-D) 60-120 MG 12 hr tablet Take 1 tablet by mouth 2 (two) times daily. 12/08/20   Joni Reining, PA-C  FLUoxetine (PROZAC) 40 MG capsule TAKE ONE CAPSULE BY MOUTH EVERY DAY 08/04/20 08/04/21  Jesse Sans, MD  FLUoxetine (PROZAC) 40 MG capsule TAKE 1 CAPSULE BY MOUTH DAILY Patient not taking: Reported on 12/01/2020 08/03/20 08/03/21  Jesse Sans, MD  Fluticasone-Umeclidin-Vilant (TRELEGY ELLIPTA) 200-62.5-25 MCG/INH AEPB Inhale 1 puff into the lungs daily. 11/24/19   Salena Saner, MD  hydrOXYzine (ATARAX/VISTARIL) 50 MG tablet TAKE ONE TABLET BY MOUTH 3 TIMES A DAY AS NEEDED FOR ANXIETY 08/04/20 08/04/21  Jesse Sans, MD  Oxcarbazepine (TRILEPTAL) 300 MG tablet Take 300 mg by mouth 2 (two) times daily. 10/31/20   [provider]  Spacer/Aero-Holding Chambers (AEROCHAMBER PLUS) inhaler Use as instructed Patient not taking: Reported on 12/01/2020 07/08/19   Domenick Gong, MD  traZODone (DESYREL) 100 MG tablet TAKE ONE TABLET BY MOUTH AT BEDTIME AS NEEDED FOR SLEEP 08/04/20 08/04/21  Jesse Sans, MD    Allergies Robitussin (alcohol free) [guaifenesin], Strawberry extract, Tramadol, Aspirin, Chocolate flavor, and Codeine  Family History  Problem Relation Age of Onset   Asthma Mother    Heart disease Mother    Diabetes Mother    Hyperlipidemia Mother    Hypertension Mother    Mental illness Mother    Migraines Mother    Thyroid disease Mother    Stroke Mother    Alcohol abuse Mother    Bipolar disorder Mother    Anxiety disorder Mother    COPD Mother    Hypertension Sister    Depression Sister    Drug abuse Sister    Cancer Maternal Grandmother    Diabetes  Maternal Grandmother    Heart disease Maternal Grandmother    Hyperlipidemia Maternal Grandmother    Hypertension Maternal Grandmother    Kidney disease Maternal Grandmother    Heart disease Paternal Grandfather    Alcohol abuse Paternal Grandfather    Drug abuse Paternal Grandfather     Social History Social History   Tobacco Use   Smoking status: Every Day    Packs/day: 1.00    Years: 15.00    Pack years: 15.00    Types: Cigarettes    Start date: 09/07/2019    Last attempt to quit: 09/23/2019    Years since quitting: 1.3   Smokeless tobacco: Never  Tobacco comments:    did not want to quit but can't inhale w/ coughing  Vaping Use   Vaping Use: Never used  Substance Use Topics   Alcohol use: No    Alcohol/week: 0.0 standard drinks   Drug use: No    Review of Systems Constitutional: No fever. Eyes: No visual changes. ENT: No sore throat. Cardiovascular: Denies chest pain. Respiratory: Denies shortness of breath. Gastrointestinal: No nausea, vomiting, diarrhea. Genitourinary: Negative for dysuria. Musculoskeletal: Negative for back pain. Skin: Negative for rash. Neurological: Negative for focal weakness or numbness.   ____________________________________________   PHYSICAL EXAM:  VITAL SIGNS: ED Triage Vitals  Enc Vitals Group     BP 02/12/21 2145 138/88     Pulse Rate 02/12/21 2145 (!) 115     Resp 02/12/21 2145 18     Temp 02/12/21 2145 98.2 F (36.8 C)     Temp Source 02/12/21 2145 Oral     SpO2 02/12/21 2145 96 %     Weight 02/12/21 2141 198 lb (89.8 kg)     Height 02/12/21 2141 5\' 5"  (1.651 m)     Head Circumference --      Peak Flow --      Pain Score 02/12/21 2141 6     Pain Loc --      Pain Edu? --      Excl. in GC? --    CONSTITUTIONAL: Alert and oriented and responds appropriately to questions. Well-appearing; well-nourished; GCS 15 HEAD: Normocephalic; atraumatic EYES: Conjunctivae clear, PERRL, EOMI ENT: normal nose; no rhinorrhea;  moist mucous membranes; pharynx without lesions noted; no dental injury; no septal hematoma NECK: Supple, no meningismus, no LAD; no midline spinal tenderness, step-off or deformity; trachea midline CARD: RRR; S1 and S2 appreciated; no murmurs, no clicks, no rubs, no gallops RESP: Normal chest excursion without splinting or tachypnea; breath sounds clear and equal bilaterally; no wheezes, no rhonchi, no rales; no hypoxia or respiratory distress CHEST:  chest wall stable, no crepitus or ecchymosis or deformity, nontender to palpation; no flail chest ABD/GI: Normal bowel sounds; non-distended; soft, non-tender, no rebound, no guarding; no ecchymosis or other lesions noted PELVIS:  stable, nontender to palpation BACK:  The back appears normal and is non-tender to palpation, there is no CVA tenderness; no midline spinal tenderness, step-off or deformity EXT: Tender to palpation over the right knee without deformity, joint effusion or soft tissue swelling.  No ecchymosis.  Compartments are soft.  No tenderness over the right hip, foot or ankle.  2+ right DP pulse.  Able to stand and ambulate to the bed without difficulty.  Difficult to test ligamentous laxity due to patient intolerance but I do not appreciate any laxity today.  No calf tenderness or calf swelling.  Normal sensation in the right leg.  Otherwise extremities nontender to palpation. SKIN: Normal color for age and race; warm NEURO: Moves all extremities equally PSYCH: The patient's mood and manner are appropriate. Grooming and personal hygiene are appropriate.  ____________________________________________   LABS (all labs ordered are listed, but only abnormal results are displayed)  Labs Reviewed - No data to display ____________________________________________  EKG   ____________________________________________  RADIOLOGY I, Natsuko Kelsay, personally viewed and evaluated these images (plain radiographs) as part of my medical  decision making, as well as reviewing the written report by the radiologist.  ED MD interpretation: X-ray shows no acute abnormality.  Official radiology report(s): DG Knee Complete 4 Views Right  Result Date: 02/12/2021 CLINICAL DATA:  Slip and fall with right knee pain. EXAM: RIGHT KNEE - COMPLETE 4+ VIEW COMPARISON:  None. FINDINGS: No evidence of fracture, dislocation, or joint effusion. No evidence of arthropathy or other focal bone abnormality. Soft tissues are unremarkable. IMPRESSION: Negative. Electronically Signed   By: Burman NievesWilliam  Stevens M.D.   On: 02/12/2021 22:06    ____________________________________________   PROCEDURES  Procedure(s) performed (including Critical Care):  Procedures    ____________________________________________   INITIAL IMPRESSION / ASSESSMENT AND PLAN / ED COURSE  As part of my medical decision making, I reviewed the following data within the electronic MEDICAL RECORD NUMBER History obtained from family, Nursing notes reviewed and incorporated, Old chart reviewed, Radiograph reviewed , Notes from prior ED visits, and [place] Controlled Substance Database         Patient here with likely right knee sprain, contusion.  No fracture, dislocation seen on x-ray.  No other sign of injury on exam.  Neurovascular intact distally.  No sign of DVT, compartment syndrome, infection today.  Will give pain medication.  We will place an Ace wrap per her request and provide with crutches.  Discussed she may ambulate as tolerated.  She has a knee sleeve for home.  Recommended rest, elevation and ice.  Provided with work note.  Given orthopedic follow-up if symptoms not improving with medical management.  At this time, I do not feel there is any life-threatening condition present. I have reviewed, interpreted and discussed all results (EKG, imaging, lab, urine as appropriate) and exam findings with patient/family. I have reviewed nursing notes and appropriate previous records.   I feel the patient is safe to be discharged home without further emergent workup and can continue workup as an outpatient as needed. Discussed usual and customary return precautions. Patient/family verbalize understanding and are comfortable with this plan.  Outpatient follow-up has been provided as needed. All questions have been answered.  ____________________________________________   FINAL CLINICAL IMPRESSION(S) / ED DIAGNOSES  Final diagnoses:  Acute pain of right knee     ED Discharge Orders          Ordered    HYDROcodone-acetaminophen (NORCO/VICODIN) 5-325 MG tablet  Every 6 hours PRN        02/13/21 0035    ibuprofen (ADVIL) 800 MG tablet  Every 8 hours PRN        02/13/21 0035    ondansetron (ZOFRAN ODT) 4 MG disintegrating tablet  Every 6 hours PRN        02/13/21 0035            *Please note:  Niel HummerHeather M Lambright was evaluated in Emergency Department on 02/13/2021 for the symptoms described in the history of present illness. She was evaluated in the context of the global COVID-19 pandemic, which necessitated consideration that the patient might be at risk for infection with the SARS-CoV-2 virus that causes COVID-19. Institutional protocols and algorithms that pertain to the evaluation of patients at risk for COVID-19 are in a state of rapid change based on information released by regulatory bodies including the CDC and federal and state organizations. These policies and algorithms were followed during the patient's care in the ED.  Some ED evaluations and interventions may be delayed as a result of limited staffing during and the pandemic.*   Note:  This document was prepared using Dragon voice recognition software and may include unintentional dictation errors.    Rotha Cassels, Layla MawKristen N, DO 02/13/21 [phone]

## 2021-04-05 ENCOUNTER — Encounter: Payer: Self-pay | Admitting: Emergency Medicine

## 2021-04-05 ENCOUNTER — Other Ambulatory Visit: Payer: Self-pay

## 2021-04-05 ENCOUNTER — Emergency Department
Admission: EM | Admit: 2021-04-05 | Discharge: 2021-04-05 | Disposition: A | Discharge status: Home / Self Care | Payer: Medicaid Other | Attending: Emergency Medicine | Admitting: Emergency Medicine

## 2021-04-05 DIAGNOSIS — Z20822 Contact with and (suspected) exposure to covid-19: Secondary | ICD-10-CM | POA: Insufficient documentation

## 2021-04-05 DIAGNOSIS — Z7951 Long term (current) use of inhaled steroids: Secondary | ICD-10-CM | POA: Insufficient documentation

## 2021-04-05 DIAGNOSIS — F17210 Nicotine dependence, cigarettes, uncomplicated: Secondary | ICD-10-CM | POA: Insufficient documentation

## 2021-04-05 DIAGNOSIS — B349 Viral infection, unspecified: Secondary | ICD-10-CM

## 2021-04-05 DIAGNOSIS — J45909 Unspecified asthma, uncomplicated: Secondary | ICD-10-CM | POA: Insufficient documentation

## 2021-04-05 LAB — RESP PANEL BY RT-PCR (FLU A&B, COVID) ARPGX2
Influenza A by PCR: NEGATIVE
Influenza B by PCR: NEGATIVE
SARS Coronavirus 2 by RT PCR: NEGATIVE

## 2021-04-05 MED ORDER — HYDROCOD POLST-CPM POLST ER 10-8 MG/5ML PO SUER
5.0000 mL | Freq: Once | ORAL | Status: AC
  Administered 2021-04-05: 5 mL via ORAL
  Filled 2021-04-05: qty 5

## 2021-04-05 MED ORDER — PREDNISONE 10 MG PO TABS: 50.0000 mg | ORAL_TABLET | Freq: Every day | ORAL | 0 refills | Status: DC

## 2021-04-05 MED ORDER — PREDNISONE 20 MG PO TABS
60.0000 mg | ORAL_TABLET | Freq: Once | ORAL | Status: AC
  Administered 2021-04-05: 60 mg via ORAL
  Filled 2021-04-05: qty 3

## 2021-04-05 NOTE — ED Notes (Signed)
Pt states that she hasn't felt well in the past couple days, states that she has been coughing and coughing up clear sputum as well. Pt denies fever or having been around anyone who has been sick

## 2021-04-05 NOTE — ED Provider Notes (Signed)
[facility] Emergency Department Provider Note  ____________________________________________  Time seen: Approximately 2:45 PM  I have reviewed the triage vital signs and the nursing notes.   HISTORY  Chief Complaint Cough, Sore Throat, and Shortness of Breath   HPI [name] is a 35 y.o. female presenting to the emergency department for treatment and evaluation of cough, shortness of breath x 2 days. History of asthma and smoking. Using albuterol more frequently. No known fever. No known COVID exposure.   Past Medical History:  Diagnosis Date   Allergy    Anxiety    Asthma 11/24/2019   blood allergy testing done 11/24/19 to see what causes it to flare up   Bipolar 1 disorder (HCC)    Bipolar disorder (HCC)    Chronic cough    being worked up with Dr. Jayme Cloud   Depression    Dyspareunia in female 11/2019   Dysrhythmia    TACHY BUT NOT A PROBLEM   Ectopic pregnancy    had surgery   GERD (gastroesophageal reflux disease)    History of concussion 2016   Hyperlipidemia    Migraine    has an aura. happening frequently   MRSA (methicillin resistant Staphylococcus aureus)    was on chin.  many years ago   NSVD (normal spontaneous vaginal delivery)    x 2   Poor dentition    Pseudoseizures (HCC)    related to anxiety   Scoliosis    Scoliosis     Patient Active Problem List   Diagnosis Date Noted   Generalized anxiety disorder 08/03/2020   Insomnia due to mental condition 08/03/2020   S/P laparoscopic hysterectomy 12/25/2019   Pelvic pain 11/27/2019   Dyspareunia due to medical condition in female 11/27/2019   Dysmenorrhea 11/27/2019   Radial styloid tenosynovitis 10/13/2019   Bipolar affective disorder (HCC) 01/26/2015   Seizures (HCC) 01/12/2015   Asthma    Hyperlipidemia    Bipolar 2 disorder (HCC)    Allergy    Migraine headache with aura     Past Surgical History:  Procedure Laterality Date   ABDOMINAL HYSTERECTOMY      CARPAL TUNNEL RELEASE  2015   left   CYSTOSCOPY  11/27/2019   Procedure: CYSTOSCOPY;  Surgeon: Nadara Mustard, MD;  Location: [facility] ORS;  Service: Gynecology;;   ECTOPIC PREGNANCY SURGERY  2011   HERNIA REPAIR  2010   TOTAL LAPAROSCOPIC HYSTERECTOMY WITH SALPINGECTOMY Bilateral 11/27/2019   Procedure: TOTAL LAPAROSCOPIC HYSTERECTOMY WITH SALPINGECTOMY;  Surgeon: Nadara Mustard, MD;  Location: [facility] ORS;  Service: Gynecology;  Laterality: Bilateral;  needs RNFA   TUBAL LIGATION      Prior to Admission medications   Medication Sig Start Date End Date Taking? Authorizing Provider  predniSONE (DELTASONE) 10 MG tablet Take 5 tablets (50 mg total) by mouth daily. 04/05/21  Yes Mekai Wilkinson B, FNP  albuterol (VENTOLIN HFA) 108 (90 Base) MCG/ACT inhaler Inhale 2 puffs into the lungs every 4 (four) hours as needed for wheezing or shortness of breath. 07/08/19   Domenick Gong, MD  ARIPiprazole (ABILIFY) 5 MG tablet TAKE ONE TABLET BY MOUTH EVERY DAY 08/04/20 03/24/21  Jesse Sans, MD  ARIPiprazole (ABILIFY) 5 MG tablet TAKE 1 TABLET BY MOUTH DAILY Patient not taking: Reported on 12/01/2020 08/03/20 08/03/21  Jesse Sans, MD  benzonatate (TESSALON PERLES) 100 MG capsule Take 2 capsules (200 mg total) by mouth 3 (three) times daily as needed. 12/08/20 12/08/21  Joni Reining,  PA-C  divalproex (DEPAKOTE ER) 500 MG 24 hr tablet TAKE ONE TABLET BY MOUTH AT BEDTIME Patient not taking: Reported on 12/01/2020 08/04/20 12/22/20  Jesse Sans, MD  divalproex (DEPAKOTE ER) 500 MG 24 hr tablet TAKE 1 TABLET BY MOUTH AT BEDTIME FOR 7 DAYS. Patient not taking: Reported on 12/01/2020 08/03/20 08/03/21  Jesse Sans, MD  fexofenadine-pseudoephedrine (ALLEGRA-D) 60-120 MG 12 hr tablet Take 1 tablet by mouth 2 (two) times daily. 12/08/20   Joni Reining, PA-C  FLUoxetine (PROZAC) 40 MG capsule TAKE ONE CAPSULE BY MOUTH EVERY DAY 08/04/20 08/04/21  Jesse Sans, MD  FLUoxetine (PROZAC) 40 MG capsule TAKE 1  CAPSULE BY MOUTH DAILY Patient not taking: Reported on 12/01/2020 08/03/20 08/03/21  Jesse Sans, MD  Fluticasone-Umeclidin-Vilant (TRELEGY ELLIPTA) 200-62.5-25 MCG/INH AEPB Inhale 1 puff into the lungs daily. 11/24/19   Salena Saner, MD  HYDROcodone-acetaminophen (NORCO/VICODIN) 5-325 MG tablet Take 2 tablets by mouth every 6 (six) hours as needed. 02/13/21   Ward, Layla Maw, DO  hydrOXYzine (ATARAX/VISTARIL) 50 MG tablet TAKE ONE TABLET BY MOUTH 3 TIMES A DAY AS NEEDED FOR ANXIETY 08/04/20 08/04/21  Jesse Sans, MD  ibuprofen (ADVIL) 800 MG tablet Take 1 tablet (800 mg total) by mouth every 8 (eight) hours as needed for mild pain. 02/13/21   Ward, Layla Maw, DO  ondansetron (ZOFRAN ODT) 4 MG disintegrating tablet Take 1 tablet (4 mg total) by mouth every 6 (six) hours as needed for nausea or vomiting. 02/13/21   Ward, Layla Maw, DO  Oxcarbazepine (TRILEPTAL) 300 MG tablet Take 300 mg by mouth 2 (two) times daily. 10/31/20   [provider]  Spacer/Aero-Holding Chambers (AEROCHAMBER PLUS) inhaler Use as instructed Patient not taking: Reported on 12/01/2020 07/08/19   Domenick Gong, MD  traZODone (DESYREL) 100 MG tablet TAKE ONE TABLET BY MOUTH AT BEDTIME AS NEEDED FOR SLEEP 08/04/20 08/04/21  Jesse Sans, MD    Allergies Robitussin (alcohol free) [guaifenesin], Strawberry extract, Tramadol, Aspirin, Chocolate flavor, and Codeine  Family History  Problem Relation Age of Onset   Asthma Mother    Heart disease Mother    Diabetes Mother    Hyperlipidemia Mother    Hypertension Mother    Mental illness Mother    Migraines Mother    Thyroid disease Mother    Stroke Mother    Alcohol abuse Mother    Bipolar disorder Mother    Anxiety disorder Mother    COPD Mother    Hypertension Sister    Depression Sister    Drug abuse Sister    Cancer Maternal Grandmother    Diabetes Maternal Grandmother    Heart disease Maternal Grandmother    Hyperlipidemia Maternal Grandmother     Hypertension Maternal Grandmother    Kidney disease Maternal Grandmother    Heart disease Paternal Grandfather    Alcohol abuse Paternal Grandfather    Drug abuse Paternal Grandfather     Social History Social History   Tobacco Use   Smoking status: Every Day    Packs/day: 1.00    Years: 15.00    Pack years: 15.00    Types: Cigarettes    Start date: 09/07/2019    Last attempt to quit: 09/23/2019    Years since quitting: 1.5   Smokeless tobacco: Never   Tobacco comments:    did not want to quit but can't inhale w/ coughing  Vaping Use   Vaping Use: Never used  Substance Use Topics  Alcohol use: No    Alcohol/week: 0.0 standard drinks   Drug use: No    Review of Systems Constitutional: Negative fever/chills. Normal appetite. ENT: Positive for sore throat. Cardiovascular: Denies chest pain. Respiratory: Positive for shortness of breath. Positive for cough. Positive for wheezing.  Gastrointestinal: Negative for nausea,  no vomiting.  Negataive for diarrhea.  Musculoskeletal: Positive for body aches Skin: Negative for rash. Neurological: Positive for headaches ____________________________________________   PHYSICAL EXAM:  VITAL SIGNS: ED Triage Vitals  Enc Vitals Group     BP 04/05/21 1316 119/80     Pulse Rate 04/05/21 1316 94     Resp 04/05/21 1316 18     Temp 04/05/21 1316 98.3 F (36.8 C)     Temp Source 04/05/21 1316 Oral     SpO2 04/05/21 1316 96 %     Weight 04/05/21 1301 197 lb 15.6 oz (89.8 kg)     Height 04/05/21 1301 5\' 5"  (1.651 m)     Head Circumference --      Peak Flow --      Pain Score 04/05/21 1301 7     Pain Loc --      Pain Edu? --      Excl. in GC? --     Constitutional: Alert and oriented. Acutely ill appearing and in no acute distress. Eyes: Conjunctivae are normal. Ears: Bilateral TM normal Nose: No sinus congestion noted; no rhinnorhea. Mouth/Throat: Mucous membranes are moist.  Oropharynx erythematous. Tonsils absent. Uvula  midline. Neck: No stridor.  Lymphatic: No cervical lymphadenopathy. Cardiovascular: Normal rate, regular rhythm. Good peripheral circulation. Respiratory: Respirations are even and unlabored.  No retractions. Breath sounds diminished throughout without wheezing. Gastrointestinal: Soft and nontender.  Musculoskeletal: FROM x 4 extremities.  Neurologic:  Normal speech and language. Skin:  Skin is warm, dry and intact. No rash noted. Psychiatric: Mood and affect are normal. Speech and behavior are normal.  ____________________________________________   LABS (all labs ordered are listed, but only abnormal results are displayed)  Labs Reviewed  RESP PANEL BY RT-PCR (FLU A&B, COVID) [id number]   ____________________________________________  EKG  Not indicated. ____________________________________________  RADIOLOGY  Not indicated. ____________________________________________   PROCEDURES  Procedure(s) performed: None  Critical Care performed: No ____________________________________________   INITIAL IMPRESSION / ASSESSMENT AND PLAN / ED COURSE  35 y.o. female presents to the ER for viral symptoms as described in the HPI. Will test for influenza and COVID.   COVID and influenza are negative. She is to continue using her inhaler. Five days of prednisone sent to her pharmacy. She is to follow up with primary care or return to the ER for symptoms of concern.   Medications  chlorpheniramine-HYDROcodone (TUSSIONEX) 10-8 MG/5ML suspension 5 mL (5 mLs Oral Given 04/05/21 1454)  predniSONE (DELTASONE) tablet 60 mg (60 mg Oral Given 04/05/21 1453)    ED Discharge Orders          Ordered    predniSONE (DELTASONE) 10 MG tablet  Daily        04/05/21 1540             Pertinent labs & imaging results that were available during my care of the patient were reviewed by me and considered in my medical decision making (see chart for details).    If controlled substance  prescribed during this visit, 12 month history viewed on the [facility] prior to issuing an initial prescription for Schedule II or III opiod. ____________________________________________   FINAL CLINICAL IMPRESSION(S) / ED  DIAGNOSES  Final diagnoses:  Acute viral syndrome    Note:  This document was prepared using Dragon voice recognition software and may include unintentional dictation errors.     Chinita Pester, FNP 04/05/21 1549    Dionne Bucy, MD 04/05/21 1614

## 2021-04-05 NOTE — ED Triage Notes (Signed)
Pt comes into the ED via POV c/o cough, SHOB, and sore throat that has been ongoing for a couple days.  Pt denies having been around anyone known with COVId, but also admits she has not tested herself.  Pt in NAD with even and unlabored respirations at this time.

## 2021-04-05 NOTE — Discharge Instructions (Signed)
COVID and influenza tests are negative.  Continue your inhaler and take the prednisone as prescribed.  Return to the ER for symptoms that change or worsen if unable to schedule an appointment.

## 2021-04-23 IMAGING — US US PELVIS COMPLETE TRANSABD/TRANSVAG W DUPLEX
1 series · 13 of 25 positions shown · non-contrast
Comparison: CT same day

CLINICAL DATA: Pelvic pain for 4 days



[Series 1: us pelvic complete w transvaginal and torsion righ · 13 of 97 slices shown]
[im 1/97]
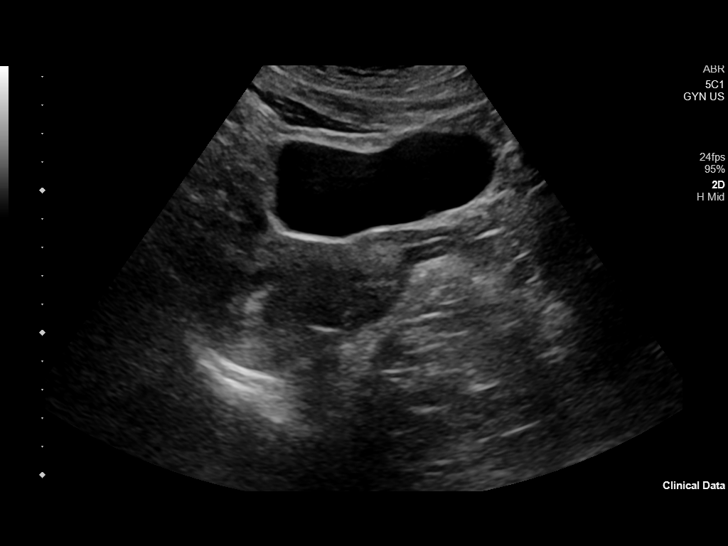
[im 9/97]
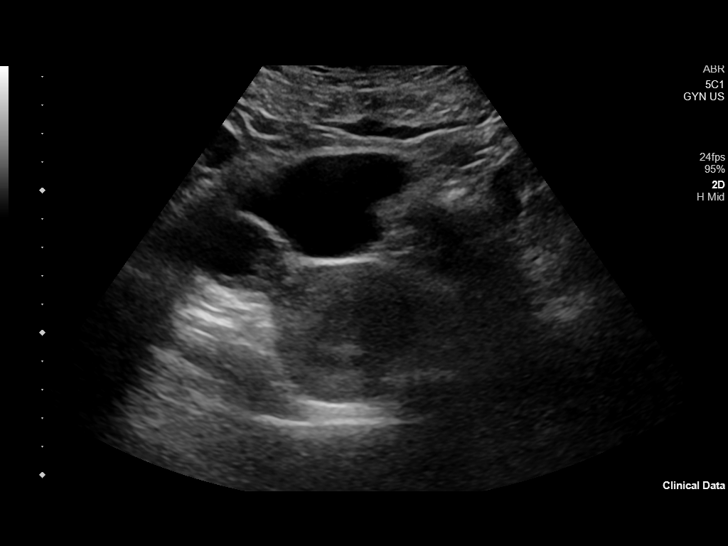
[im 17/97]
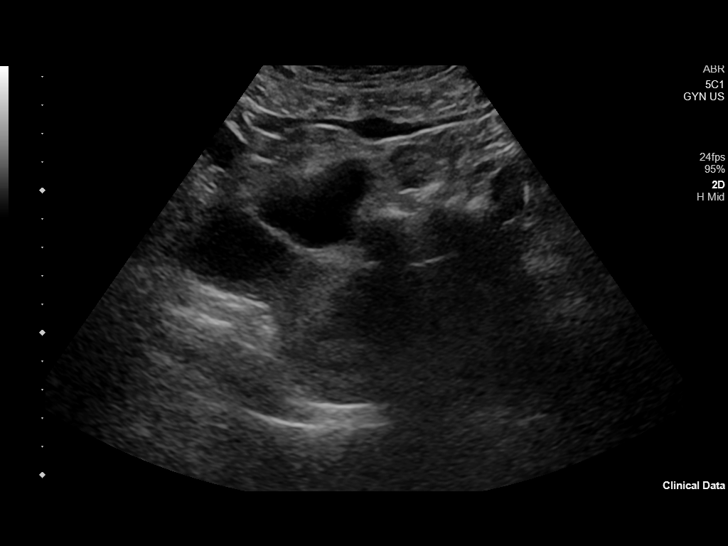
[im 25/97]
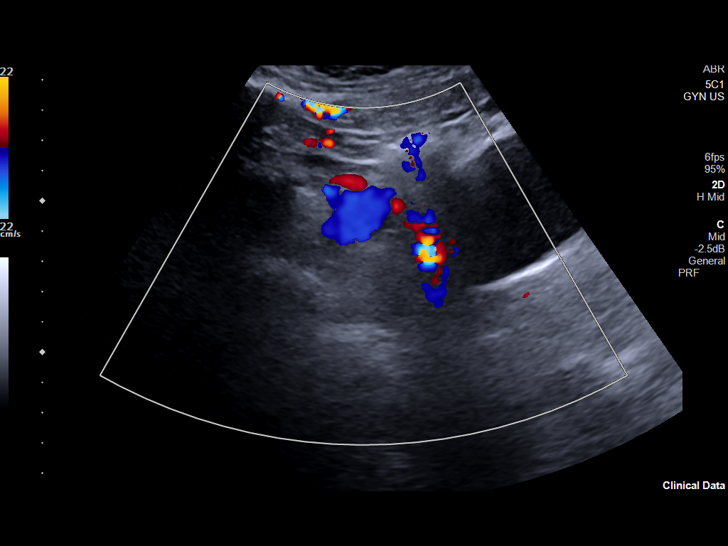
[im 33/97]
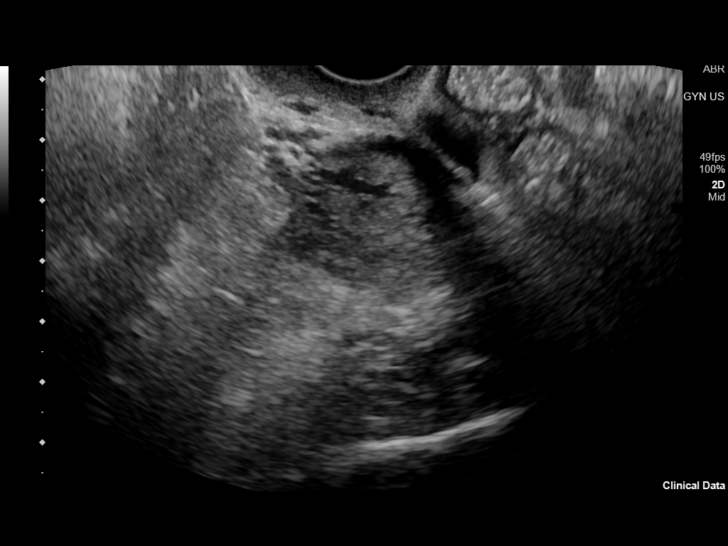
[im 41/97]
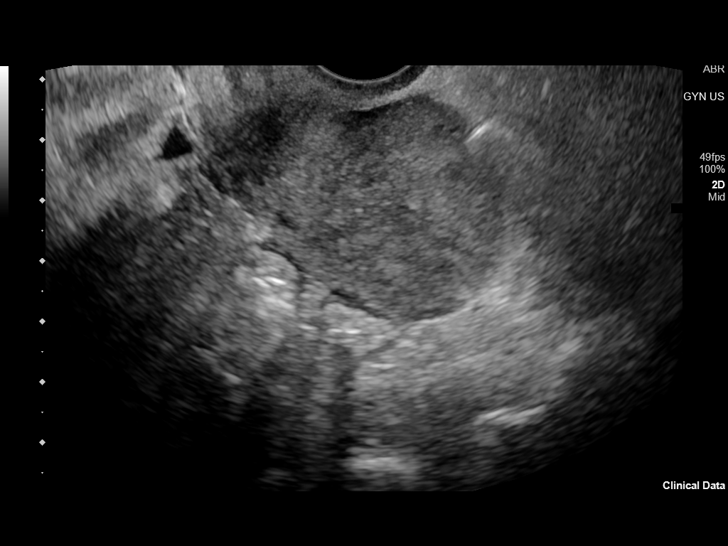
[im 49/97]
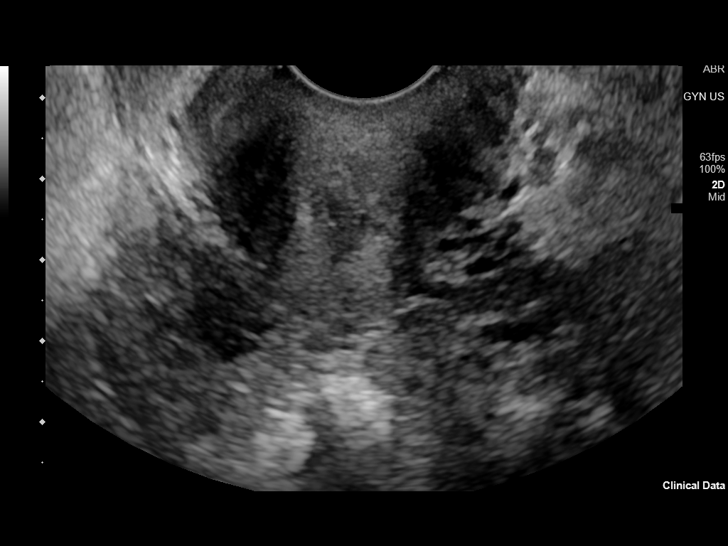
[im 57/97]
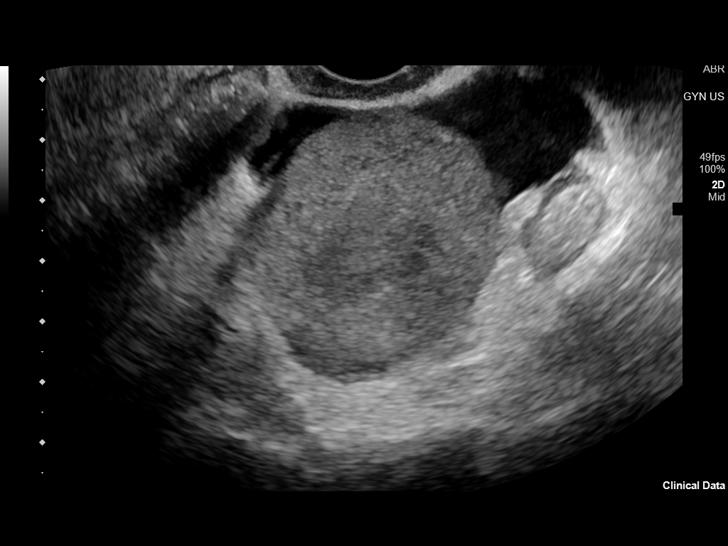
[im 65/97]
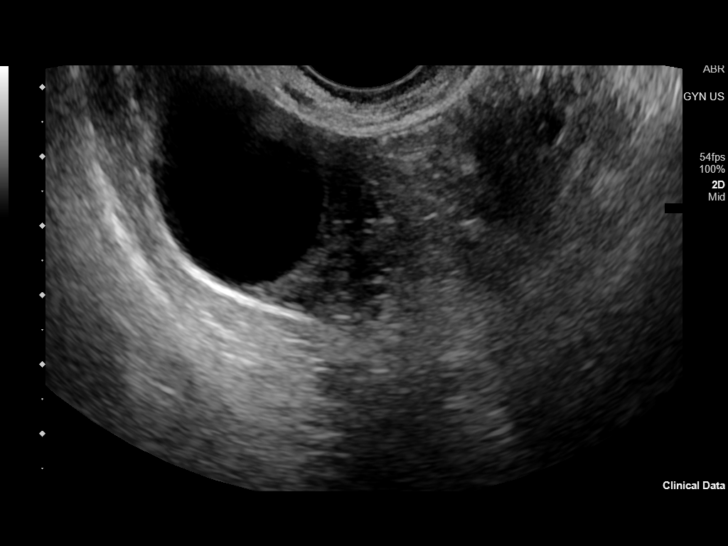
[im 73/97]
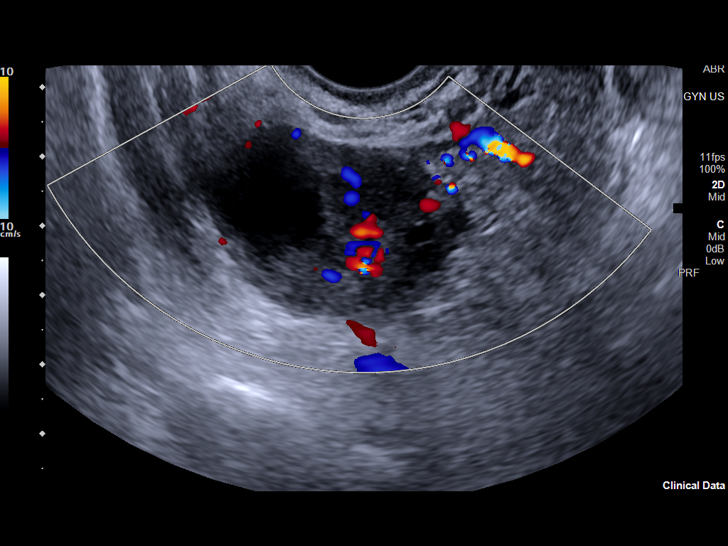
[im 81/97]
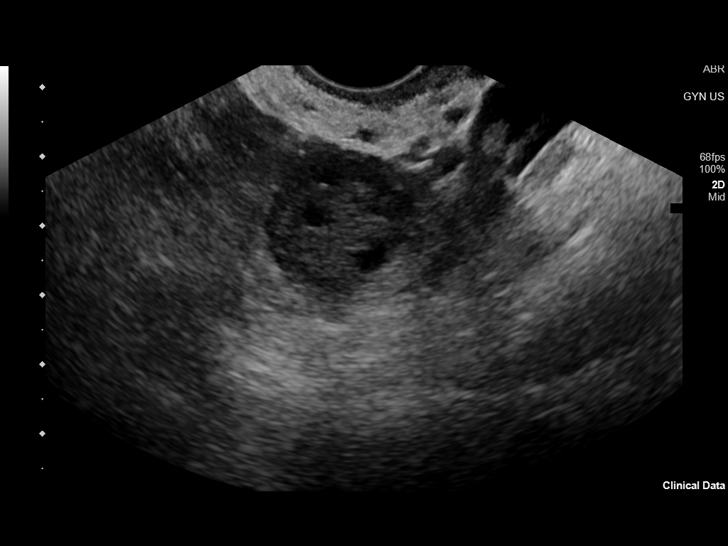
[im 89/97]
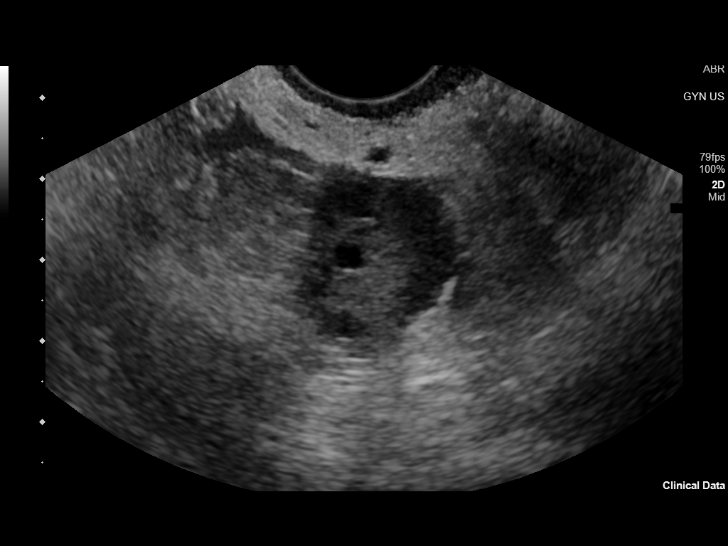
[im 97/97]
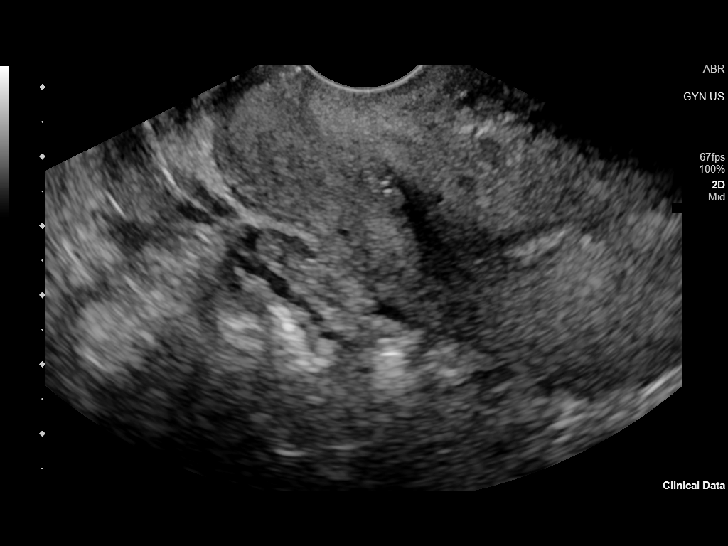

[13 of 25 positions shown; findings below may reference images not displayed]

FINDINGS: Uterus

Measurements: 7.2 x 4.2 x 4.8 cm = volume: 75 mL. No fibroids or
other mass visualized. The uterus is retroverted.

Endometrium

Thickness: 7 mm.  No focal abnormality visualized.

Right ovary

Measurements: 4.1 x 2.9 x 3.6 cm = volume: 22.4 mL. There is a
probable dominant follicle/small simple ovarian cyst measuring 2.5 x
2.4 x 2.3 cm. Normal Doppler flow seen.

Left ovary

Measurements: 2.5 x 2.1 x 1.9 cm = volume: 5.1 mL. Normal
appearance/no adnexal mass. Normal Doppler flow.

Other findings

There is trace free fluid in the cul-de-sac.
IMPRESSION: Normal uterus.

Probable dominant follicle/simple ovarian cyst within the right
ovary measuring 2.5 x 2.4 x 2.3 cm.

## 2021-05-03 ENCOUNTER — Other Ambulatory Visit: Payer: Self-pay

## 2021-05-03 ENCOUNTER — Emergency Department
Admission: EM | Admit: 2021-05-03 | Discharge: 2021-05-03 | Disposition: A | Discharge status: Home / Self Care | Payer: Medicaid Other | Attending: Emergency Medicine | Admitting: Emergency Medicine

## 2021-05-03 ENCOUNTER — Encounter: Payer: Self-pay | Admitting: Emergency Medicine

## 2021-05-03 DIAGNOSIS — Z20822 Contact with and (suspected) exposure to covid-19: Secondary | ICD-10-CM | POA: Insufficient documentation

## 2021-05-03 DIAGNOSIS — B349 Viral infection, unspecified: Secondary | ICD-10-CM | POA: Insufficient documentation

## 2021-05-03 DIAGNOSIS — F17210 Nicotine dependence, cigarettes, uncomplicated: Secondary | ICD-10-CM | POA: Insufficient documentation

## 2021-05-03 DIAGNOSIS — J45909 Unspecified asthma, uncomplicated: Secondary | ICD-10-CM | POA: Insufficient documentation

## 2021-05-03 LAB — RESP PANEL BY RT-PCR (FLU A&B, COVID) ARPGX2
Influenza A by PCR: NEGATIVE
Influenza B by PCR: NEGATIVE
SARS Coronavirus 2 by RT PCR: NEGATIVE

## 2021-05-03 NOTE — ED Provider Notes (Addendum)
[facility] Emergency Department Provider Note  ____________________________________________   Event Date/Time   First MD Initiated Contact with Patient 05/03/21 1331      (approximate)  I have reviewed the triage vital signs and the nursing notes.   HISTORY  Chief Complaint Headache and Generalized Body Aches   HPI [name] is a 35 y.o. female presents to the ED with complaint of flulike symptoms for the last 2 days.  Patient states started with headache and now body aches.  She states that this was sudden onset 2 days ago.  She is unaware of any fever and denies nausea, vomiting or diarrhea.  Patient did get COVID-vaccine.  She is unaware of any sick contacts.  She rates her pain as an 8 out of 10.      Past Medical History:  Diagnosis Date   Allergy    Anxiety    Asthma 11/24/2019   blood allergy testing done 11/24/19 to see what causes it to flare up   Bipolar 1 disorder (HCC)    Bipolar disorder (HCC)    Chronic cough    being worked up with Dr. Jayme Cloud   Depression    Dyspareunia in female 11/2019   Dysrhythmia    TACHY BUT NOT A PROBLEM   Ectopic pregnancy    had surgery   GERD (gastroesophageal reflux disease)    History of concussion 2016   Hyperlipidemia    Migraine    has an aura. happening frequently   MRSA (methicillin resistant Staphylococcus aureus)    was on chin.  many years ago   NSVD (normal spontaneous vaginal delivery)    x 2   Poor dentition    Pseudoseizures (HCC)    related to anxiety   Scoliosis    Scoliosis     Patient Active Problem List   Diagnosis Date Noted   Generalized anxiety disorder 08/03/2020   Insomnia due to mental condition 08/03/2020   S/P laparoscopic hysterectomy 12/25/2019   Pelvic pain 11/27/2019   Dyspareunia due to medical condition in female 11/27/2019   Dysmenorrhea 11/27/2019   Radial styloid tenosynovitis 10/13/2019   Bipolar affective disorder (HCC) 01/26/2015    Seizures (HCC) 01/12/2015   Asthma    Hyperlipidemia    Bipolar 2 disorder (HCC)    Allergy    Migraine headache with aura     Past Surgical History:  Procedure Laterality Date   ABDOMINAL HYSTERECTOMY     CARPAL TUNNEL RELEASE  2015   left   CYSTOSCOPY  11/27/2019   Procedure: CYSTOSCOPY;  Surgeon: Nadara Mustard, MD;  Location: [facility] ORS;  Service: Gynecology;;   ECTOPIC PREGNANCY SURGERY  2011   HERNIA REPAIR  2010   TOTAL LAPAROSCOPIC HYSTERECTOMY WITH SALPINGECTOMY Bilateral 11/27/2019   Procedure: TOTAL LAPAROSCOPIC HYSTERECTOMY WITH SALPINGECTOMY;  Surgeon: Nadara Mustard, MD;  Location: [facility] ORS;  Service: Gynecology;  Laterality: Bilateral;  needs RNFA   TUBAL LIGATION      Prior to Admission medications   Medication Sig Start Date End Date Taking? Authorizing Provider  albuterol (VENTOLIN HFA) 108 (90 Base) MCG/ACT inhaler Inhale 2 puffs into the lungs every 4 (four) hours as needed for wheezing or shortness of breath. 07/08/19   Domenick Gong, MD  ARIPiprazole (ABILIFY) 5 MG tablet TAKE ONE TABLET BY MOUTH EVERY DAY 08/04/20 03/24/21  Jesse Sans, MD  divalproex (DEPAKOTE ER) 500 MG 24 hr tablet TAKE ONE TABLET BY MOUTH AT BEDTIME Patient not taking:  Reported on 12/01/2020 08/04/20 12/22/20  Jesse Sans, MD  divalproex (DEPAKOTE ER) 500 MG 24 hr tablet TAKE 1 TABLET BY MOUTH AT BEDTIME FOR 7 DAYS. Patient not taking: Reported on 12/01/2020 08/03/20 08/03/21  Jesse Sans, MD  fexofenadine-pseudoephedrine (ALLEGRA-D) 60-120 MG 12 hr tablet Take 1 tablet by mouth 2 (two) times daily. 12/08/20   Joni Reining, PA-C  FLUoxetine (PROZAC) 40 MG capsule TAKE ONE CAPSULE BY MOUTH EVERY DAY 08/04/20 08/04/21  Jesse Sans, MD  Fluticasone-Umeclidin-Vilant (TRELEGY ELLIPTA) 200-62.5-25 MCG/INH AEPB Inhale 1 puff into the lungs daily. 11/24/19   Salena Saner, MD  hydrOXYzine (ATARAX/VISTARIL) 50 MG tablet TAKE ONE TABLET BY MOUTH 3 TIMES A DAY AS NEEDED FOR ANXIETY  08/04/20 08/04/21  Jesse Sans, MD  Oxcarbazepine (TRILEPTAL) 300 MG tablet Take 300 mg by mouth 2 (two) times daily. 10/31/20   [provider]  Spacer/Aero-Holding Chambers (AEROCHAMBER PLUS) inhaler Use as instructed Patient not taking: Reported on 12/01/2020 07/08/19   Domenick Gong, MD  traZODone (DESYREL) 100 MG tablet TAKE ONE TABLET BY MOUTH AT BEDTIME AS NEEDED FOR SLEEP 08/04/20 08/04/21  Jesse Sans, MD    Allergies Robitussin (alcohol free) [guaifenesin], Strawberry extract, Tramadol, Aspirin, Chocolate flavor, and Codeine  Family History  Problem Relation Age of Onset   Asthma Mother    Heart disease Mother    Diabetes Mother    Hyperlipidemia Mother    Hypertension Mother    Mental illness Mother    Migraines Mother    Thyroid disease Mother    Stroke Mother    Alcohol abuse Mother    Bipolar disorder Mother    Anxiety disorder Mother    COPD Mother    Hypertension Sister    Depression Sister    Drug abuse Sister    Cancer Maternal Grandmother    Diabetes Maternal Grandmother    Heart disease Maternal Grandmother    Hyperlipidemia Maternal Grandmother    Hypertension Maternal Grandmother    Kidney disease Maternal Grandmother    Heart disease Paternal Grandfather    Alcohol abuse Paternal Grandfather    Drug abuse Paternal Grandfather     Social History Social History   Tobacco Use   Smoking status: Every Day    Packs/day: 1.00    Years: 15.00    Pack years: 15.00    Types: Cigarettes    Start date: 09/07/2019    Last attempt to quit: 09/23/2019    Years since quitting: 1.6   Smokeless tobacco: Never   Tobacco comments:    did not want to quit but can't inhale w/ coughing  Vaping Use   Vaping Use: Never used  Substance Use Topics   Alcohol use: No    Alcohol/week: 0.0 standard drinks   Drug use: No    Review of Systems Constitutional: No fever/chills Eyes: No visual changes. ENT: No sore throat. Cardiovascular: Denies chest  pain. Respiratory: Denies shortness of breath. Gastrointestinal: No abdominal pain.  No nausea, no vomiting.  No diarrhea. Genitourinary: Negative for dysuria. Musculoskeletal: Positive for body aches. Skin: Negative for rash. Neurological: Positive headache.  Negative for focal weakness or numbness. ____________________________________________   PHYSICAL EXAM:  VITAL SIGNS: ED Triage Vitals  Enc Vitals Group     BP 05/03/21 1320 115/63     Pulse Rate 05/03/21 1320 (!) 130     Resp 05/03/21 1320 18     Temp 05/03/21 1320 98.6 F (37 C)  Temp Source 05/03/21 1320 Oral     SpO2 05/03/21 1320 98 %     Weight 05/03/21 1321 197 lb 15.6 oz (89.8 kg)     Height 05/03/21 1321 5\' 5"  (1.651 m)     Head Circumference --      Peak Flow --      Pain Score 05/03/21 1320 8     Pain Loc --      Pain Edu? --      Excl. in GC? --     Constitutional: Alert and oriented. Well appearing and in no acute distress. Eyes: Conjunctivae are normal. PERRL. EOMI. Head: Atraumatic. Nose: No congestion/rhinnorhea. Mouth/Throat: Mucous membranes are moist.  Oropharynx non-erythematous. Neck: No stridor.   Cardiovascular: Normal rate, regular rhythm. Grossly normal heart sounds.  Good peripheral circulation. Respiratory: Normal respiratory effort.  No retractions. Lungs CTAB. Gastrointestinal: Soft and nontender. No distention. No abdominal bruits. No CVA tenderness. Musculoskeletal: No lower extremity tenderness nor edema.  No joint effusions. Neurologic:  Normal speech and language. No gross focal neurologic deficits are appreciated. No gait instability. Skin:  Skin is warm, dry and intact. No rash noted. Psychiatric: Mood and affect are normal. Speech and behavior are normal.  ____________________________________________   LABS (all labs ordered are listed, but only abnormal results are displayed)  Labs Reviewed  RESP PANEL BY RT-PCR (FLU A&B, COVID) ARPGX2  POC URINE PREG, ED    ____________________________________________   PROCEDURES  Procedure(s) performed (including Critical Care):  Procedures   ____________________________________________   INITIAL IMPRESSION / ASSESSMENT AND PLAN / ED COURSE  As part of my medical decision making, I reviewed the following data within the electronic MEDICAL RECORD NUMBER Notes from prior ED visits and [place] Controlled Substance Database  35 year old female presents to the ED with 2-days of flulike symptoms.  Patient states that she has body aches and headache.  There is been no nausea, vomiting or diarrhea.  Influenza and COVID were negative and patient was made aware.  We are treating this like a viral illness.  She is aware that she needs to drink fluids frequently to stay hydrated.  She will continue taking Tylenol or ibuprofen as needed for headache and body aches.  She is to follow-up with her PCP if any continued problems and also follow-up with urgent care over the weekend if she has any concerns. ____________________________________________   FINAL CLINICAL IMPRESSION(S) / ED DIAGNOSES  Final diagnoses:  Viral illness     ED Discharge Orders     None        Note:  This document was prepared using Dragon voice recognition software and may include unintentional dictation errors.    59, PA-C 05/03/21 1527    13/09/22, PA-C 05/03/21 1528    13/09/22, MD 05/03/21 [phone]

## 2021-05-03 NOTE — Discharge Instructions (Signed)
Follow-up with your primary care provider if any continued problems or concerns.  Drink lots of fluids to stay hydrated.  Tylenol or ibuprofen as needed for headache, fever or body aches.  If any worsening of your symptoms return to the emergency department or follow-up with an urgent care.

## 2021-05-03 NOTE — ED Triage Notes (Signed)
Pt comes into the ED via POV c/o f/u like symptoms with body aches and headache x 2 days.  Pt ambulatory to triage at this time and in NAD.  Pt denies any known fevers at home.  Pt a&ox4.

## 2021-06-15 ENCOUNTER — Other Ambulatory Visit: Payer: Self-pay

## 2021-06-15 ENCOUNTER — Encounter: Payer: Self-pay | Admitting: *Deleted

## 2021-06-15 ENCOUNTER — Emergency Department
Admission: EM | Admit: 2021-06-15 | Discharge: 2021-06-16 | Discharge status: Against Medical Advice | Payer: Medicaid Other | Attending: Emergency Medicine | Admitting: Emergency Medicine

## 2021-06-15 DIAGNOSIS — Z7951 Long term (current) use of inhaled steroids: Secondary | ICD-10-CM | POA: Insufficient documentation

## 2021-06-15 DIAGNOSIS — Z5321 Procedure and treatment not carried out due to patient leaving prior to being seen by health care provider: Secondary | ICD-10-CM | POA: Insufficient documentation

## 2021-06-15 DIAGNOSIS — R110 Nausea: Secondary | ICD-10-CM | POA: Insufficient documentation

## 2021-06-15 DIAGNOSIS — R519 Headache, unspecified: Secondary | ICD-10-CM | POA: Insufficient documentation

## 2021-06-15 DIAGNOSIS — J45909 Unspecified asthma, uncomplicated: Secondary | ICD-10-CM | POA: Insufficient documentation

## 2021-06-15 DIAGNOSIS — F17210 Nicotine dependence, cigarettes, uncomplicated: Secondary | ICD-10-CM | POA: Insufficient documentation

## 2021-06-15 MED ORDER — DIPHENHYDRAMINE HCL 50 MG/ML IJ SOLN: 50.0000 mg | Freq: Once | INTRAMUSCULAR | Status: DC

## 2021-06-15 MED ORDER — SODIUM CHLORIDE 0.9 % IV BOLUS: 1000.0000 mL | Freq: Once | INTRAVENOUS | Status: DC

## 2021-06-15 MED ORDER — KETOROLAC TROMETHAMINE 30 MG/ML IJ SOLN: 30.0000 mg | Freq: Once | INTRAMUSCULAR | Status: DC

## 2021-06-15 NOTE — ED Provider Notes (Signed)
[facility]-EMERGENCY DEPARTMENT  ____________________________________________  Time seen: Approximately 7:56 PM  I have reviewed the triage vital signs and the nursing notes.   HISTORY  Chief Complaint Headache   Historian Patient     HPI [name] is a 35 y.o. female presents to the emergency department with headache that started at 11 AM this morning.  Patient states that multiple family members in the home are sick and she states that she just does not feel good.  She states that she has had some nausea.  No vomiting.  No diarrhea.  Patient states that she does have a history of migraines and that she has had worse headaches in her life.  She states that headache came on slowly and progressed in intensity slowly.  No chest pain, chest tightness or shortness of breath.   Past Medical History:  Diagnosis Date   Allergy    Anxiety    Asthma 11/24/2019   blood allergy testing done 11/24/19 to see what causes it to flare up   Bipolar 1 disorder (HCC)    Bipolar disorder (HCC)    Chronic cough    being worked up with Dr. Jayme Cloud   Depression    Dyspareunia in female 11/2019   Dysrhythmia    TACHY BUT NOT A PROBLEM   Ectopic pregnancy    had surgery   GERD (gastroesophageal reflux disease)    History of concussion 2016   Hyperlipidemia    Migraine    has an aura. happening frequently   MRSA (methicillin resistant Staphylococcus aureus)    was on chin.  many years ago   NSVD (normal spontaneous vaginal delivery)    x 2   Poor dentition    Pseudoseizures (HCC)    related to anxiety   Scoliosis    Scoliosis      Immunizations up to date:  Yes.     Past Medical History:  Diagnosis Date   Allergy    Anxiety    Asthma 11/24/2019   blood allergy testing done 11/24/19 to see what causes it to flare up   Bipolar 1 disorder (HCC)    Bipolar disorder (HCC)    Chronic cough    being worked up with Dr. Jayme Cloud   Depression    Dyspareunia in female 11/2019    Dysrhythmia    TACHY BUT NOT A PROBLEM   Ectopic pregnancy    had surgery   GERD (gastroesophageal reflux disease)    History of concussion 2016   Hyperlipidemia    Migraine    has an aura. happening frequently   MRSA (methicillin resistant Staphylococcus aureus)    was on chin.  many years ago   NSVD (normal spontaneous vaginal delivery)    x 2   Poor dentition    Pseudoseizures (HCC)    related to anxiety   Scoliosis    Scoliosis     Patient Active Problem List   Diagnosis Date Noted   Generalized anxiety disorder 08/03/2020   Insomnia due to mental condition 08/03/2020   S/P laparoscopic hysterectomy 12/25/2019   Pelvic pain 11/27/2019   Dyspareunia due to medical condition in female 11/27/2019   Dysmenorrhea 11/27/2019   Radial styloid tenosynovitis 10/13/2019   Bipolar affective disorder (HCC) 01/26/2015   Seizures (HCC) 01/12/2015   Asthma    Hyperlipidemia    Bipolar 2 disorder (HCC)    Allergy    Migraine headache with aura     Past Surgical History:  Procedure Laterality  Date   ABDOMINAL HYSTERECTOMY     CARPAL TUNNEL RELEASE  2015   left   CYSTOSCOPY  11/27/2019   Procedure: CYSTOSCOPY;  Surgeon: Nadara Mustard, MD;  Location: [facility] ORS;  Service: Gynecology;;   ECTOPIC PREGNANCY SURGERY  2011   HERNIA REPAIR  2010   TOTAL LAPAROSCOPIC HYSTERECTOMY WITH SALPINGECTOMY Bilateral 11/27/2019   Procedure: TOTAL LAPAROSCOPIC HYSTERECTOMY WITH SALPINGECTOMY;  Surgeon: Nadara Mustard, MD;  Location: [facility] ORS;  Service: Gynecology;  Laterality: Bilateral;  needs RNFA   TUBAL LIGATION      Prior to Admission medications   Medication Sig Start Date End Date Taking? Authorizing Provider  albuterol (VENTOLIN HFA) 108 (90 Base) MCG/ACT inhaler Inhale 2 puffs into the lungs every 4 (four) hours as needed for wheezing or shortness of breath. 07/08/19   Domenick Gong, MD  ARIPiprazole (ABILIFY) 5 MG tablet TAKE ONE TABLET BY MOUTH EVERY DAY 08/04/20 03/24/21   Jesse Sans, MD  divalproex (DEPAKOTE ER) 500 MG 24 hr tablet TAKE ONE TABLET BY MOUTH AT BEDTIME Patient not taking: Reported on 12/01/2020 08/04/20 12/22/20  Jesse Sans, MD  divalproex (DEPAKOTE ER) 500 MG 24 hr tablet TAKE 1 TABLET BY MOUTH AT BEDTIME FOR 7 DAYS. Patient not taking: Reported on 12/01/2020 08/03/20 08/03/21  Jesse Sans, MD  fexofenadine-pseudoephedrine (ALLEGRA-D) 60-120 MG 12 hr tablet Take 1 tablet by mouth 2 (two) times daily. 12/08/20   Joni Reining, PA-C  FLUoxetine (PROZAC) 40 MG capsule TAKE ONE CAPSULE BY MOUTH EVERY DAY 08/04/20 08/04/21  Jesse Sans, MD  Fluticasone-Umeclidin-Vilant (TRELEGY ELLIPTA) 200-62.5-25 MCG/INH AEPB Inhale 1 puff into the lungs daily. 11/24/19   Salena Saner, MD  hydrOXYzine (ATARAX/VISTARIL) 50 MG tablet TAKE ONE TABLET BY MOUTH 3 TIMES A DAY AS NEEDED FOR ANXIETY 08/04/20 08/04/21  Jesse Sans, MD  Oxcarbazepine (TRILEPTAL) 300 MG tablet Take 300 mg by mouth 2 (two) times daily. 10/31/20   [provider]  Spacer/Aero-Holding Chambers (AEROCHAMBER PLUS) inhaler Use as instructed Patient not taking: Reported on 12/01/2020 07/08/19   Domenick Gong, MD  traZODone (DESYREL) 100 MG tablet TAKE ONE TABLET BY MOUTH AT BEDTIME AS NEEDED FOR SLEEP 08/04/20 08/04/21  Jesse Sans, MD    Allergies Robitussin (alcohol free) [guaifenesin], Strawberry extract, Tramadol, Aspirin, Chocolate flavor, and Codeine  Family History  Problem Relation Age of Onset   Asthma Mother    Heart disease Mother    Diabetes Mother    Hyperlipidemia Mother    Hypertension Mother    Mental illness Mother    Migraines Mother    Thyroid disease Mother    Stroke Mother    Alcohol abuse Mother    Bipolar disorder Mother    Anxiety disorder Mother    COPD Mother    Hypertension Sister    Depression Sister    Drug abuse Sister    Cancer Maternal Grandmother    Diabetes Maternal Grandmother    Heart disease Maternal Grandmother     Hyperlipidemia Maternal Grandmother    Hypertension Maternal Grandmother    Kidney disease Maternal Grandmother    Heart disease Paternal Grandfather    Alcohol abuse Paternal Grandfather    Drug abuse Paternal Grandfather     Social History Social History   Tobacco Use   Smoking status: Every Day    Packs/day: 1.00    Years: 15.00    Pack years: 15.00    Types: Cigarettes    Start date: 09/07/2019  Last attempt to quit: 09/23/2019    Years since quitting: 1.7   Smokeless tobacco: Never   Tobacco comments:    did not want to quit but can't inhale w/ coughing  Vaping Use   Vaping Use: Never used  Substance Use Topics   Alcohol use: No    Alcohol/week: 0.0 standard drinks   Drug use: No     Review of Systems  Constitutional: No fever/chills Eyes:  No discharge ENT: No upper respiratory complaints. Respiratory: no cough. No SOB/ use of accessory muscles to breath Gastrointestinal:   No nausea, no vomiting.  No diarrhea.  No constipation. Musculoskeletal: Negative for musculoskeletal pain. Neuro: Patient has headache.  Skin: Negative for rash, abrasions, lacerations, ecchymosis.   ____________________________________________   PHYSICAL EXAM:  VITAL SIGNS: ED Triage Vitals [06/15/21 1921]  Enc Vitals Group     BP 113/73     Pulse Rate (!) 102     Resp 18     Temp 99 F (37.2 C)     Temp Source Oral     SpO2 98 %     Weight 190 lb (86.2 kg)     Height  (1.651 m)     Head Circumference      Peak Flow      Pain Score 7     Pain Loc      Pain Edu?      Excl. in GC?      Constitutional: Alert and oriented. Well appearing and in no acute distress. Eyes: Conjunctivae are normal. PERRL. EOMI. Head: Atraumatic. ENT:      Ears:       Nose: No congestion/rhinnorhea.      Mouth/Throat: Mucous membranes are moist.  Neck: No stridor.  No cervical spine tenderness to palpation. Cardiovascular: Normal rate, regular rhythm. Normal S1 and S2.  Good  peripheral circulation. Respiratory: Normal respiratory effort without tachypnea or retractions. Lungs CTAB. Good air entry to the bases with no decreased or absent breath sounds Gastrointestinal: Bowel sounds x 4 quadrants. Soft and nontender to palpation. No guarding or rigidity. No distention. Musculoskeletal: Full range of motion to all extremities. No obvious deformities noted Neurologic:  Normal for age. No gross focal neurologic deficits are appreciated.  Skin:  Skin is warm, dry and intact. No rash noted. Psychiatric: Mood and affect are normal for age. Speech and behavior are normal.   ____________________________________________   LABS (all labs ordered are listed, but only abnormal results are displayed)  Labs Reviewed  RESP PANEL BY RT-PCR (FLU A&B, COVID) ARPGX2  CBC WITH DIFFERENTIAL/PLATELET  COMPREHENSIVE METABOLIC PANEL  URINALYSIS, COMPLETE (UACMP) WITH MICROSCOPIC   ____________________________________________  EKG   ____________________________________________  RADIOLOGY   No results found.  ____________________________________________    PROCEDURES  Procedure(s) performed:     Procedures     Medications  sodium chloride 0.9 % bolus 1,000 mL (has no administration in time range)  ketorolac (TORADOL) 30 MG/ML injection 30 mg (has no administration in time range)  diphenhydrAMINE (BENADRYL) injection 50 mg (has no administration in time range)     ____________________________________________   INITIAL IMPRESSION / ASSESSMENT AND PLAN / ED COURSE  Pertinent labs & imaging results that were available during my care of the patient were reviewed by me and considered in my medical decision making (see chart for details).      Assessment and Plan:  Headache:  Patient presented to the emergency department for headache.  She eloped from the emergency department  before work-up could be  completed.    ____________________________________________  FINAL CLINICAL IMPRESSION(S) / ED DIAGNOSES  Final diagnoses:  Acute nonintractable headache, unspecified headache type      NEW MEDICATIONS STARTED DURING THIS VISIT:  ED Discharge Orders     None           This chart was dictated using voice recognition software/Dragon. Despite best efforts to proofread, errors can occur which can change the meaning. Any change was purely unintentional.     Gasper Lloyd 06/15/21 2236    Phineas Semen, MD 06/15/21 2259

## 2021-06-15 NOTE — ED Notes (Addendum)
Pts visitor to desk stating "she is tired of waiting for her IV and wants to leave".  Pt ambulated out of Flex.  RN and provider notified

## 2021-06-15 NOTE — ED Triage Notes (Signed)
Pt has a headache since this am.  Pt reports nausea.  Pt took motrin without relief.  Pt alert  speech clear.

## 2021-08-01 ENCOUNTER — Emergency Department
Admission: EM | Admit: 2021-08-01 | Discharge: 2021-08-01 | Disposition: A | Discharge status: Home / Self Care | Payer: Medicaid Other | Attending: Emergency Medicine | Admitting: Emergency Medicine

## 2021-08-01 ENCOUNTER — Other Ambulatory Visit: Payer: Self-pay

## 2021-08-01 DIAGNOSIS — H9203 Otalgia, bilateral: Secondary | ICD-10-CM | POA: Insufficient documentation

## 2021-08-01 DIAGNOSIS — J069 Acute upper respiratory infection, unspecified: Secondary | ICD-10-CM | POA: Insufficient documentation

## 2021-08-01 DIAGNOSIS — Z20822 Contact with and (suspected) exposure to covid-19: Secondary | ICD-10-CM | POA: Insufficient documentation

## 2021-08-01 LAB — RESP PANEL BY RT-PCR (FLU A&B, COVID) ARPGX2
Influenza A by PCR: NEGATIVE
Influenza B by PCR: NEGATIVE
SARS Coronavirus 2 by RT PCR: NEGATIVE

## 2021-08-01 LAB — GROUP A STREP BY PCR: Group A Strep by PCR: NOT DETECTED

## 2021-08-01 NOTE — ED Notes (Signed)
Pr to ED for sore throat since this morning that has worsened today. Resting in bed.

## 2021-08-01 NOTE — ED Provider Notes (Signed)
[facility] Provider Note  Patient Contact: 3:56 PM (approximate)   History   Sore Throat   HPI  [name] is a 36 y.o. female presents to the emergency department with pharyngitis, bilateral otalgia, rhinorrhea and nasal congestion for the past 2 to 3 days.  No vomiting or diarrhea.  No sick contacts in the home with similar symptoms.  No chest pain or abdominal pain.      Physical Exam   Triage Vital Signs: ED Triage Vitals  Enc Vitals Group     BP 08/01/21 1459 129/73     Pulse Rate 08/01/21 1459 (!) 112     Resp 08/01/21 1459 18     Temp 08/01/21 1459 98.7 F (37.1 C)     Temp src --      SpO2 08/01/21 1459 98 %     Weight 08/01/21 1430 220 lb (99.8 kg)     Height 08/01/21 1430 5\' 5"  (1.651 m)     Head Circumference --      Peak Flow --      Pain Score 08/01/21 1430 0     Pain Loc --      Pain Edu? --      Excl. in GC? --     Most recent vital signs: Vitals:   08/01/21 1459 08/01/21 1653  BP: 129/73 134/87  Pulse: (!) 112 (!) 110  Resp: 18 16  Temp: 98.7 F (37.1 C) 98.4 F (36.9 C)  SpO2: 98% 99%     Constitutional: Alert and oriented. Patient is lying supine. Eyes: Conjunctivae are normal. PERRL. EOMI. Head: Atraumatic. ENT:      Ears: Tympanic membranes are mildly injected with mild effusion bilaterally.       Nose: No congestion/rhinnorhea.      Mouth/Throat: Mucous membranes are moist. Posterior pharynx is mildly erythematous.  Hematological/Lymphatic/Immunilogical: No cervical lymphadenopathy.  Cardiovascular: Normal rate, regular rhythm. Normal S1 and S2.  Good peripheral circulation. Respiratory: Normal respiratory effort without tachypnea or retractions. Lungs CTAB. Good air entry to the bases with no decreased or absent breath sounds. Gastrointestinal: Bowel sounds 4 quadrants. Soft and nontender to palpation. No guarding or rigidity. No palpable masses. No distention. No CVA tenderness. Musculoskeletal:  Full range of motion to all extremities. No gross deformities appreciated. Neurologic:  Normal speech and language. No gross focal neurologic deficits are appreciated.  Skin:  Skin is warm, dry and intact. No rash noted. Psychiatric: Mood and affect are normal. Speech and behavior are normal. Patient exhibits appropriate insight and judgement.    ED Results / Procedures / Treatments   Labs (all labs ordered are listed, but only abnormal results are displayed) Labs Reviewed  RESP PANEL BY RT-PCR (FLU A&B, COVID) [id number]  GROUP A STREP BY PCR          PROCEDURES:  Critical Care performed: No  Procedures   MEDICATIONS ORDERED IN ED: Medications - No data to display   IMPRESSION / MDM / ASSESSMENT AND PLAN / ED COURSE  I reviewed the triage vital signs and the nursing notes.                              Differential diagnosis includes, but is not limited to, strep throat, Covid 19, Influenza, unspecified Viral URI...  Assessment and Plan: Viral URI:  36 year old female presents to the emergency department with viral URI-like symptoms for the past 2 to  3 days.  Patient was tachycardic at triage but vital signs otherwise reassuring.  She was negative for COVID, influenza and group A strep.  Breast hydration were encouraged at home as well as daily Zyrtec she was advised to follow-up with primary care as needed.   FINAL CLINICAL IMPRESSION(S) / ED DIAGNOSES   Final diagnoses:  Viral upper respiratory tract infection     Rx / DC Orders   ED Discharge Orders     None        Note:  This document was prepared using Dragon voice recognition software and may include unintentional dictation errors.   Pia Mau [place], PA-C 08/01/21 1739    Minna Antis, MD 08/04/21 2100

## 2021-08-01 NOTE — ED Triage Notes (Signed)
Pt to ED for sorethroat, earache that started this am. Denies fevers.

## 2021-08-01 NOTE — Discharge Instructions (Addendum)
You can take 10 mg of Zyrtec (over-the-counter) daily to help with ear discomfort. You can also take 650 mg of Tylenol every 4 hours.

## 2021-10-11 ENCOUNTER — Telehealth (INDEPENDENT_AMBULATORY_CARE_PROVIDER_SITE_OTHER): Payer: Self-pay | Admitting: Internal Medicine

## 2021-10-11 DIAGNOSIS — R051 Acute cough: Secondary | ICD-10-CM

## 2021-10-11 DIAGNOSIS — R110 Nausea: Secondary | ICD-10-CM

## 2021-10-11 DIAGNOSIS — R0981 Nasal congestion: Secondary | ICD-10-CM

## 2021-10-11 DIAGNOSIS — J3489 Other specified disorders of nose and nasal sinuses: Secondary | ICD-10-CM

## 2021-10-11 MED ORDER — BENZONATATE 100 MG PO CAPS: 100.0000 mg | ORAL_CAPSULE | Freq: Two times a day (BID) | ORAL | 0 refills | Status: DC | PRN

## 2021-10-11 MED ORDER — FLUTICASONE PROPIONATE 50 MCG/ACT NA SUSP: 2.0000 | Freq: Every day | NASAL | 6 refills | Status: DC

## 2021-10-11 MED ORDER — ONDANSETRON 8 MG PO TBDP: 8.0000 mg | ORAL_TABLET | Freq: Three times a day (TID) | ORAL | 0 refills | Status: DC | PRN

## 2021-10-11 NOTE — Patient Instructions (Signed)
It was great seeing you today! ? ?Plan discussed at today's visit: ?-COVID test today ?-For nasal congestion, use Flonase and Claritin ?-Cough suppressant sent to pharmacy ?-Medication to take as needed for nausea sent at well ?-Try to rest, stay well hydrated and eat bland foods until symptoms resolve ? ?Follow up in: as needed ? ?Take care and let us know if you have any questions or concerns prior to your next visit. ? ?Dr. Caralee Ates ? ?

## 2021-10-11 NOTE — Progress Notes (Signed)
Virtual Visit via Video Note ? ?I connected with [name] on 10/11/21 at  8:00 AM EDT by a video enabled telemedicine application and verified that I am speaking with the correct person using two identifiers. ? ?Location: ?Patient: Home ?Provider: [facility] ?  ?I discussed the limitations of evaluation and management by telemedicine and the availability of in person appointments. The patient expressed understanding and agreed to proceed. ? ?History of Present Illness: ? ?[name] is a 36 year old female presenting via telemedicine for head and stomach discomfort. Symptoms started 4 days ago. Feeling facial pain/pressure with nasal congestion with cough. Cough is dry and hard. Rhinorrhea is clear. No fevers, chills. No sore throat. No ear pain or pressure. Very fatigued. Stomach is more feeling nauseous and having diarrhea. No abdominal pain. Diarrhea is not urgent but she's had several loose/watery stools. No vomiting. No known sick contacts but works at [employer].  ? ?Observations/Objective: ? ?General: well appearing, no acute distress ?ENT: conjunctiva normal appearing bilaterally  ?Neuro: answers all questions appropriately  ? ?Assessment and Plan: ? ?1. Sinus pressure/Acute cough/Nasal congestion/Nausea: Symptoms appear to be viral in nature. Will come in for COVID test. Otherwise treat conservatively with nasal steroid, oral anti-histamine, cough suppressant and Zofran PRN nausea. Needs to rest, work note given. Follow up is symptoms worsen or fail to improve. ? ?- fluticasone (FLONASE) 50 MCG/ACT nasal spray; Place 2 sprays into both nostrils daily.  Dispense: 16 g; Refill: 6 ?- Novel Coronavirus, NAA ([facility]) ?- benzonatate (TESSALON) 100 MG capsule; Take 1 capsule (100 mg total) by mouth 2 (two) times daily as needed for cough.  Dispense: 20 capsule; Refill: 0 ?- Novel Coronavirus, NAA ([facility]) ?- ondansetron (ZOFRAN-ODT) 8 MG disintegrating tablet; Take 1 tablet (8 mg total) by mouth every 8  (eight) hours as needed for nausea or vomiting.  Dispense: 20 tablet; Refill: 0 ? ? ?Follow Up Instructions: PRN ? ?  ?I discussed the assessment and treatment plan with the patient. The patient was provided an opportunity to ask questions and all were answered. The patient agreed with the plan and demonstrated an understanding of the instructions. ?  ?The patient was advised to call back or seek an in-person evaluation if the symptoms worsen or if the condition fails to improve as anticipated. ? ?I provided 11 minutes of non-face-to-face time during this encounter. ? ? ?Margarita Mail, DO ? ?

## 2021-10-12 LAB — NOVEL CORONAVIRUS, NAA: SARS-CoV-2, NAA: NOT DETECTED

## 2021-10-12 LAB — SPECIMEN STATUS REPORT

## 2021-10-13 IMAGING — CR DG CHEST 2V
1 series · 2 of 2 positions shown · non-contrast
Comparison: 09/08/2019

CLINICAL DATA: Chest pain

EXAM:
CHEST - 2 VIEW

[Series 1: dg chest 2 view · 0.14mm/px · 2 of 2 slices shown]
[im 1/2]
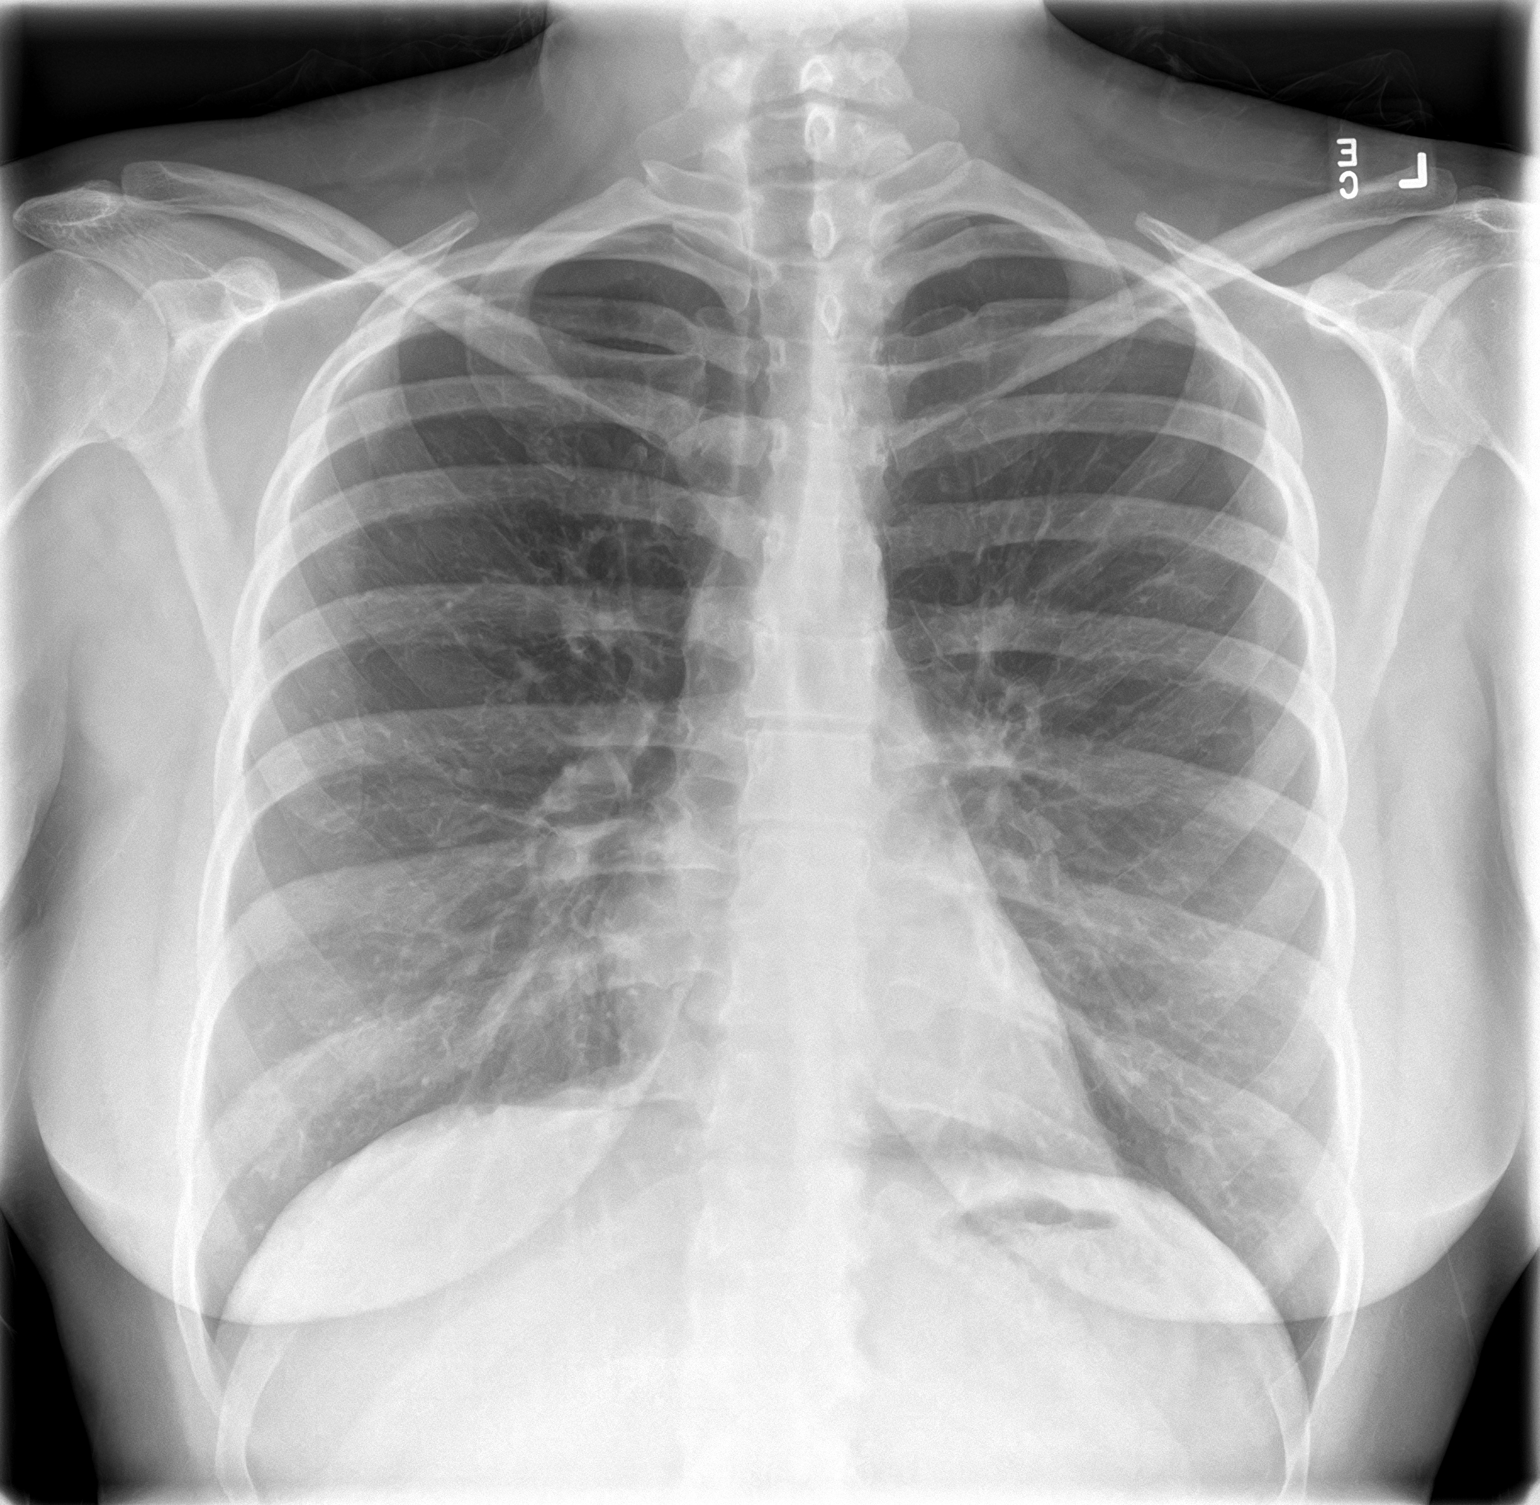
[im 2/2]
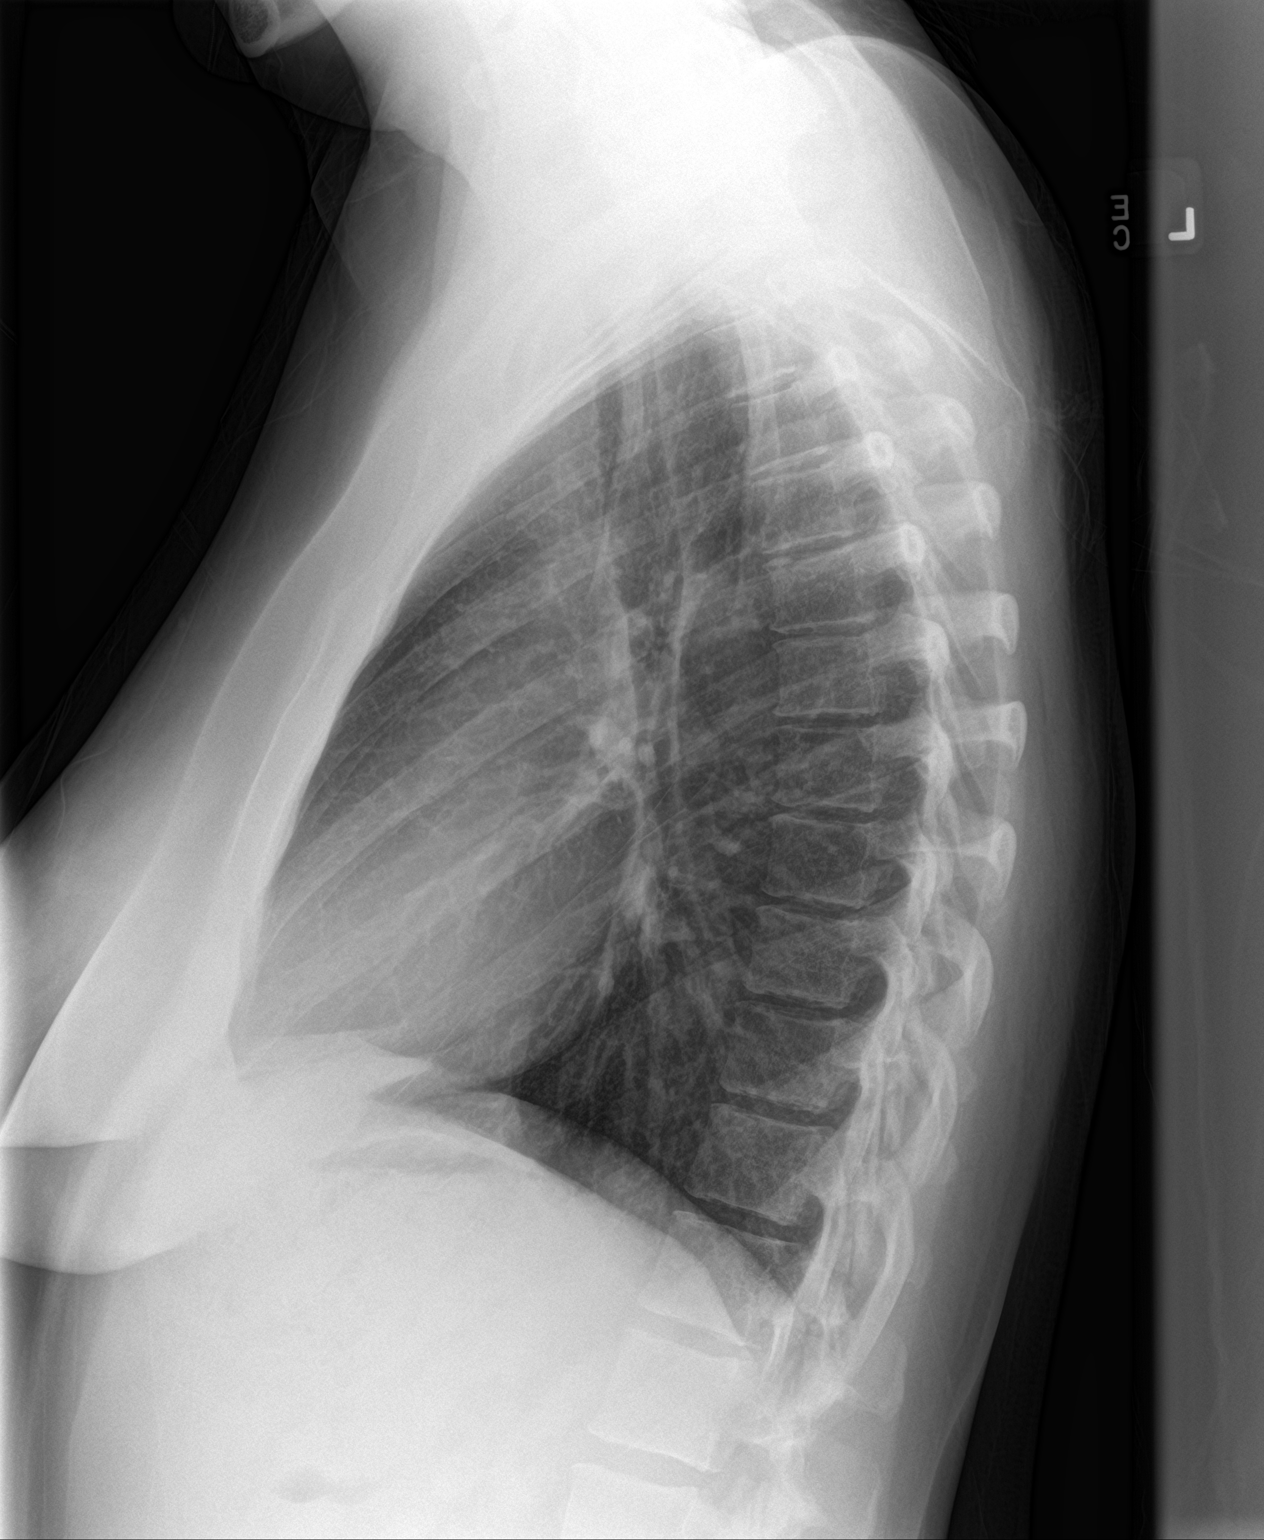

[2 of 2 positions shown; findings below may reference images not displayed]

FINDINGS: The heart size and mediastinal contours are within normal limits.
Both lungs are clear. The visualized skeletal structures are
unremarkable.
IMPRESSION: No active cardiopulmonary disease.

## 2021-11-29 ENCOUNTER — Other Ambulatory Visit: Payer: Self-pay

## 2021-11-29 ENCOUNTER — Emergency Department
Admission: EM | Admit: 2021-11-29 | Discharge: 2021-11-29 | Disposition: A | Discharge status: Home / Self Care | Payer: Medicaid Other | Attending: Emergency Medicine | Admitting: Emergency Medicine

## 2021-11-29 DIAGNOSIS — J45909 Unspecified asthma, uncomplicated: Secondary | ICD-10-CM | POA: Insufficient documentation

## 2021-11-29 DIAGNOSIS — R519 Headache, unspecified: Secondary | ICD-10-CM | POA: Insufficient documentation

## 2021-11-29 DIAGNOSIS — K529 Noninfective gastroenteritis and colitis, unspecified: Secondary | ICD-10-CM | POA: Insufficient documentation

## 2021-11-29 LAB — CBC
HCT: 39 % (ref 36.0–46.0)
Hemoglobin: 13.1 g/dL (ref 12.0–15.0)
MCH: 28.7 pg (ref 26.0–34.0)
MCHC: 33.6 g/dL (ref 30.0–36.0)
MCV: 85.3 fL (ref 80.0–100.0)
Platelets: 346 10*3/uL (ref 150–400)
RBC: 4.57 MIL/uL (ref 3.87–5.11)
RDW: 13.7 % (ref 11.5–15.5)
WBC: 17.2 10*3/uL — ABNORMAL HIGH (ref 4.0–10.5)
nRBC: 0 % (ref 0.0–0.2)

## 2021-11-29 LAB — URINE DRUG SCREEN, QUALITATIVE (ARMC ONLY)
Amphetamines, Ur Screen: NOT DETECTED
Barbiturates, Ur Screen: NOT DETECTED
Benzodiazepine, Ur Scrn: NOT DETECTED
Cannabinoid 50 Ng, Ur ~~LOC~~: NOT DETECTED
Cocaine Metabolite,Ur ~~LOC~~: NOT DETECTED
MDMA (Ecstasy)Ur Screen: NOT DETECTED
Methadone Scn, Ur: NOT DETECTED
Opiate, Ur Screen: NOT DETECTED
Phencyclidine (PCP) Ur S: NOT DETECTED
Tricyclic, Ur Screen: POSITIVE — AB

## 2021-11-29 LAB — COMPREHENSIVE METABOLIC PANEL
ALT: 19 U/L (ref 0–44)
AST: 22 U/L (ref 15–41)
Albumin: 3.8 g/dL (ref 3.5–5.0)
Alkaline Phosphatase: 63 U/L (ref 38–126)
Anion gap: 9 (ref 5–15)
BUN: 15 mg/dL (ref 6–20)
CO2: 18 mmol/L — ABNORMAL LOW (ref 22–32)
Calcium: 8.6 mg/dL — ABNORMAL LOW (ref 8.9–10.3)
Chloride: 110 mmol/L (ref 98–111)
Creatinine, Ser: 0.67 mg/dL (ref 0.44–1.00)
GFR, Estimated: >60 mL/min (ref >60)
Glucose, Bld: 123 mg/dL — ABNORMAL HIGH (ref 70–99)
Potassium: 3.4 mmol/L — ABNORMAL LOW (ref 3.5–5.1)
Sodium: 137 mmol/L (ref 135–145)
Total Bilirubin: 0.4 mg/dL (ref 0.3–1.2)
Total Protein: 7.1 g/dL (ref 6.5–8.1)

## 2021-11-29 LAB — LIPASE, BLOOD: Lipase: 27 U/L (ref 11–51)

## 2021-11-29 MED ORDER — PROCHLORPERAZINE MALEATE 10 MG PO TABS: 10.0000 mg | ORAL_TABLET | Freq: Four times a day (QID) | ORAL | 0 refills | Status: DC | PRN

## 2021-11-29 MED ORDER — PROCHLORPERAZINE EDISYLATE 10 MG/2ML IJ SOLN
10.0000 mg | Freq: Once | INTRAMUSCULAR | Status: AC
  Administered 2021-11-29: 10 mg via INTRAVENOUS
  Filled 2021-11-29: qty 2

## 2021-11-29 MED ORDER — LACTATED RINGERS IV BOLUS
1000.0000 mL | Freq: Once | INTRAVENOUS | Status: AC
  Administered 2021-11-29: 1000 mL via INTRAVENOUS

## 2021-11-29 MED ORDER — DIPHENHYDRAMINE HCL 50 MG/ML IJ SOLN
25.0000 mg | Freq: Once | INTRAMUSCULAR | Status: AC
  Administered 2021-11-29: 25 mg via INTRAVENOUS
  Filled 2021-11-29: qty 1

## 2021-11-29 NOTE — ED Provider Notes (Signed)
[facility] Provider Note    Event Date/Time   First MD Initiated Contact with Patient 11/29/21 0825     (approximate)   History   Chief Complaint Abdominal Pain   HPI  ZOFIA PECKINPAUGH is a 36 y.o. female with past medical history of hyperlipidemia, migraines, seizures, asthma, and bipolar disorder who presents to the ED complaining of abdominal pain.  Patient reports that she has been dealing with 2 days of gradually worsening diffuse headache, but overnight developed nausea with multiple episodes of vomiting and diarrhea.  She describes mild crampy pain in her suprapubic area but denies any dysuria or flank pain.  She has not had any fevers and denies any blood in her emesis or stool.  She denies any stiffness in her neck and has not had any vision changes, speech changes, numbness, or weakness.  She does report she was bit by multiple ticks about 3 to 4 weeks ago, denies any associated rash.     Physical Exam   Triage Vital Signs: ED Triage Vitals  Enc Vitals Group     BP 11/29/21 0803 98/74     Pulse Rate 11/29/21 0803 (!) 110     Resp 11/29/21 0803 16     Temp 11/29/21 0803 98.1 F (36.7 C)     Temp Source 11/29/21 0803 Oral     SpO2 11/29/21 0803 95 %     Weight 11/29/21 0757 198 lb (89.8 kg)     Height 11/29/21 0757 5\' 5"  (1.651 m)     Head Circumference --      Peak Flow --      Pain Score 11/29/21 0757 8     Pain Loc --      Pain Edu? --      Excl. in GC? --     Most recent vital signs: Vitals:   11/29/21 0803  BP: 98/74  Pulse: (!) 110  Resp: 16  Temp: 98.1 F (36.7 C)  SpO2: 95%    Constitutional: Alert and oriented. Eyes: Conjunctivae are normal. Head: Atraumatic. Nose: No congestion/rhinnorhea. Mouth/Throat: Mucous membranes are moist.  Neck: Supple with no meningismus. Cardiovascular: Normal rate, regular rhythm. Grossly normal heart sounds.  2+ radial pulses bilaterally. Respiratory: Normal respiratory effort.  No  retractions. Lungs CTAB. Gastrointestinal: Soft and nontender.  No CVA tenderness bilaterally.  No distention. Musculoskeletal: No lower extremity tenderness nor edema.  Neurologic:  Normal speech and language. No gross focal neurologic deficits are appreciated.    ED Results / Procedures / Treatments   Labs (all labs ordered are listed, but only abnormal results are displayed) Labs Reviewed  CBC - Abnormal; Notable for the following components:      Result Value   WBC 17.2 (*)    All other components within normal limits  COMPREHENSIVE METABOLIC PANEL - Abnormal; Notable for the following components:   Potassium 3.4 (*)    CO2 18 (*)    Glucose, Bld 123 (*)    Calcium 8.6 (*)    All other components within normal limits  URINE DRUG SCREEN, QUALITATIVE ([facility] ONLY) - Abnormal; Notable for the following components:   Tricyclic, Ur Screen POSITIVE (*)    All other components within normal limits  LIPASE, BLOOD  LYME DISEASE SEROLOGY W/REFLEX  URINALYSIS, ROUTINE W REFLEX MICROSCOPIC   PROCEDURES:  Critical Care performed: No  Procedures   MEDICATIONS ORDERED IN ED: Medications  prochlorperazine (COMPAZINE) injection 10 mg (10 mg Intravenous Given 11/29/21  0814)  diphenhydrAMINE (BENADRYL) injection 25 mg (25 mg Intravenous Given 11/29/21 0913)  lactated ringers bolus 1,000 mL (0 mLs Intravenous Stopped 11/29/21 1212)     IMPRESSION / MDM / ASSESSMENT AND PLAN / ED COURSE  I reviewed the triage vital signs and the nursing notes.                              36 y.o. female with past medical history of hyperlipidemia, seizures, migraine, asthma, and bipolar disorder who presents to the ED complaining of 2 days of gradually worsening diffuse headache now associated with nausea, vomiting, diarrhea, and crampy lower abdominal pain.  Patient's presentation is most consistent with acute presentation with potential threat to life or bodily function.  Differential diagnosis  includes, but is not limited to, SAH, meningitis, migraine headache, tension headache, gastroenteritis, dehydration, electrolyte abnormality, AKI, UTI, Lyme disease.  Patient well-appearing and in no acute distress, vital signs reassuring and not consistent with systemic illness.  She has a nonfocal neurologic exam and describes gradually worsening headache over the past 2 days, doubt SAH.  No fever or neck stiffness to suggest meningitis.  She has a benign abdominal exam and symptoms seem most consistent with a gastroenteritis.  Labs are reassuring with no significant electrolyte abnormality or AKI, no anemia or leukocytosis noted.  She is status post hysterectomy, urinalysis is pending.  With her recent tick bites, we will send Lyme serology but low suspicion for Lyme disease or [facility] - [facility] spotted fever given no rash or arthralgia.  Plan to treat symptomatically with migraine cocktail and reassess.  Lab results are pending at this time, but on reassessment patient reports she feels better with no ongoing nausea.  She is appropriate for discharge home with PCP follow-up, will hold off on antibiotics at this time given low suspicion for Lyme disease.  She was counseled to discuss lab results with her PCP and to return to the ED for new or worsening symptoms, patient agrees with plan.      FINAL CLINICAL IMPRESSION(S) / ED DIAGNOSES   Final diagnoses:  Acute nonintractable headache, unspecified headache type  Gastroenteritis     Rx / DC Orders   ED Discharge Orders          Ordered    prochlorperazine (COMPAZINE) 10 MG tablet  Every 6 hours PRN        11/29/21 1314             Note:  This document was prepared using Dragon voice recognition software and may include unintentional dictation errors.   Chesley Noon, MD 11/29/21 1315

## 2021-11-29 NOTE — ED Triage Notes (Signed)
Pt in with co headache x 2 days and started having abd pain and diarrhea today.

## 2021-11-29 NOTE — ED Triage Notes (Addendum)
Pt also reports removing some ticks from herself about 3-4 weeks ago  Red top tube sent to lab

## 2021-11-30 LAB — LYME DISEASE SEROLOGY W/REFLEX: Lyme Total Antibody EIA: NEGATIVE

## 2021-12-16 ENCOUNTER — Emergency Department
Admission: EM | Admit: 2021-12-16 | Discharge: 2021-12-16 | Disposition: A | Discharge status: Home / Self Care | Payer: Medicaid Other | Attending: Student in an Organized Health Care Education/Training Program | Admitting: Student in an Organized Health Care Education/Training Program

## 2021-12-16 ENCOUNTER — Other Ambulatory Visit: Payer: Self-pay

## 2021-12-16 DIAGNOSIS — L03116 Cellulitis of left lower limb: Secondary | ICD-10-CM | POA: Insufficient documentation

## 2021-12-16 LAB — CBC
HCT: 37.2 % (ref 36.0–46.0)
Hemoglobin: 12.6 g/dL (ref 12.0–15.0)
MCH: 28.9 pg (ref 26.0–34.0)
MCHC: 33.9 g/dL (ref 30.0–36.0)
MCV: 85.3 fL (ref 80.0–100.0)
Platelets: 282 10*3/uL (ref 150–400)
RBC: 4.36 MIL/uL (ref 3.87–5.11)
RDW: 13.4 % (ref 11.5–15.5)
WBC: 7.6 10*3/uL (ref 4.0–10.5)
nRBC: 0 % (ref 0.0–0.2)

## 2021-12-16 LAB — BASIC METABOLIC PANEL
Anion gap: 6 (ref 5–15)
BUN: 14 mg/dL (ref 6–20)
CO2: 21 mmol/L — ABNORMAL LOW (ref 22–32)
Calcium: 9 mg/dL (ref 8.9–10.3)
Chloride: 109 mmol/L (ref 98–111)
Creatinine, Ser: 0.77 mg/dL (ref 0.44–1.00)
GFR, Estimated: >60 mL/min (ref >60)
Glucose, Bld: 108 mg/dL — ABNORMAL HIGH (ref 70–99)
Potassium: 3.5 mmol/L (ref 3.5–5.1)
Sodium: 136 mmol/L (ref 135–145)

## 2021-12-16 MED ORDER — CEPHALEXIN 500 MG PO CAPS
500.0000 mg | ORAL_CAPSULE | Freq: Four times a day (QID) | ORAL | 0 refills | Status: AC
Start: 2021-12-16 — End: 2021-12-23

## 2021-12-16 MED ORDER — DOXYCYCLINE HYCLATE 100 MG PO TABS
100.0000 mg | ORAL_TABLET | Freq: Once | ORAL | Status: AC
  Administered 2021-12-16: 100 mg via ORAL
  Filled 2021-12-16: qty 1

## 2021-12-16 MED ORDER — CEPHALEXIN 500 MG PO CAPS
500.0000 mg | ORAL_CAPSULE | Freq: Once | ORAL | Status: AC
  Administered 2021-12-16: 500 mg via ORAL
  Filled 2021-12-16: qty 1

## 2021-12-16 MED ORDER — DOXYCYCLINE MONOHYDRATE 100 MG PO TABS
100.0000 mg | ORAL_TABLET | Freq: Two times a day (BID) | ORAL | 0 refills | Status: AC
Start: 2021-12-16 — End: 2021-12-23

## 2021-12-16 NOTE — Discharge Instructions (Addendum)
You can take Keflex 4 times daily for the next 7 days. You can take doxycycline twice daily for the next 7 days. Return if symptoms seem to be worsening at home.

## 2021-12-16 NOTE — ED Notes (Signed)
See triage note. Pt ambulatory to room; pt now laying calmly on stretcher, skin dry, resp reg/unlabored. Visitor remains with pt. Pt given another warm blanket as requested.

## 2021-12-16 NOTE — ED Provider Notes (Signed)
[facility] Provider Note  Patient Contact: 8:16 PM (approximate)   History   No chief complaint on file.   HPI  [name] is a 36 y.o. female presents to the emergency department with erythema surrounding a tick bite wound.  Patient states that she did try to remove tick head with tools at home.  She denies fever and chills but states that affected area is pruritic.  No chest pain, chest tightness or abdominal pain.      Physical Exam   Triage Vital Signs: ED Triage Vitals  Enc Vitals Group     BP 12/16/21 1817 126/81     Pulse Rate 12/16/21 1817 90     Resp 12/16/21 1817 18     Temp 12/16/21 1817 98.1 F (36.7 C)     Temp Source 12/16/21 1817 Oral     SpO2 12/16/21 1817 98 %     Weight 12/16/21 1815 198 lb (89.8 kg)     Height 12/16/21 1815 5\' 5"  (1.651 m)     Head Circumference --      Peak Flow --      Pain Score 12/16/21 1815 8     Pain Loc --      Pain Edu? --      Excl. in GC? --     Most recent vital signs: Vitals:   12/16/21 1817  BP: 126/81  Pulse: 90  Resp: 18  Temp: 98.1 F (36.7 C)  SpO2: 98%     General: Alert and in no acute distress. Eyes:  PERRL. EOMI. Head: No acute traumatic findings ENT:      Nose: No congestion/rhinnorhea.      Mouth/Throat: Mucous membranes are moist. Neck: No stridor. No cervical spine tenderness to palpation. Cardiovascular:  Good peripheral perfusion Respiratory: Normal respiratory effort without tachypnea or retractions. Lungs CTAB. Good air entry to the bases with no decreased or absent breath sounds. Gastrointestinal: Bowel sounds 4 quadrants. Soft and nontender to palpation. No guarding or rigidity. No palpable masses. No distention. No CVA tenderness. Musculoskeletal: Full range of motion to all extremities.  Neurologic:  No gross focal neurologic deficits are appreciated.  Skin: Patient has a 2 cm x 2 cm region of erythema.  No apparent tick bite foreign body in place at  wound site.  No palpable induration or fluctuance to suggest abscess of left inner thigh.    ED Results / Procedures / Treatments   Labs (all labs ordered are listed, but only abnormal results are displayed) Labs Reviewed  BASIC METABOLIC PANEL - Abnormal; Notable for the following components:      Result Value   CO2 21 (*)    Glucose, Bld 108 (*)    All other components within normal limits  CBC      PROCEDURES:  Critical Care performed: No  Procedures   MEDICATIONS ORDERED IN ED: Medications  doxycycline (VIBRA-TABS) tablet 100 mg (has no administration in time range)  cephALEXin (KEFLEX) capsule 500 mg (has no administration in time range)     IMPRESSION / MDM / ASSESSMENT AND PLAN / ED COURSE  I reviewed the triage vital signs and the nursing notes.                              Assessment and plan Tick bite 36 year old female presents to the emergency department with a 2 cm x 2 cm region of erythema  along left inner thigh concerning for tick bite/cellulitis.  Patient states that she has had a total hysterectomy and therefore denies possibility of pregnancy.  We will start patient on doxycycline and Keflex.  Return precautions were given to return with new or worsening symptoms.     FINAL CLINICAL IMPRESSION(S) / ED DIAGNOSES   Final diagnoses:  Cellulitis of left leg     Rx / DC Orders   ED Discharge Orders     None        Note:  This document was prepared using Dragon voice recognition software and may include unintentional dictation errors.   Pia Mau [place], PA-C 12/16/21 2018    Willy Eddy, MD 12/17/21 1059

## 2022-03-23 ENCOUNTER — Encounter: Payer: Self-pay | Admitting: Emergency Medicine

## 2022-03-23 ENCOUNTER — Emergency Department
Admission: EM | Admit: 2022-03-23 | Discharge: 2022-03-23 | Disposition: A | Discharge status: Home / Self Care | Payer: Self-pay | Attending: Student in an Organized Health Care Education/Training Program | Admitting: Student in an Organized Health Care Education/Training Program

## 2022-03-23 ENCOUNTER — Telehealth: Payer: Self-pay | Admitting: Family Medicine

## 2022-03-23 ENCOUNTER — Emergency Department: Payer: Self-pay

## 2022-03-23 ENCOUNTER — Other Ambulatory Visit: Payer: Self-pay

## 2022-03-23 DIAGNOSIS — Z20822 Contact with and (suspected) exposure to covid-19: Secondary | ICD-10-CM | POA: Insufficient documentation

## 2022-03-23 DIAGNOSIS — J069 Acute upper respiratory infection, unspecified: Secondary | ICD-10-CM | POA: Insufficient documentation

## 2022-03-23 LAB — SARS CORONAVIRUS 2 BY RT PCR: SARS Coronavirus 2 by RT PCR: NEGATIVE

## 2022-03-23 LAB — BASIC METABOLIC PANEL
Anion gap: 5 (ref 5–15)
BUN: 14 mg/dL (ref 6–20)
CO2: 23 mmol/L (ref 22–32)
Calcium: 8.9 mg/dL (ref 8.9–10.3)
Chloride: 110 mmol/L (ref 98–111)
Creatinine, Ser: 0.72 mg/dL (ref 0.44–1.00)
GFR, Estimated: >60 mL/min (ref >60)
Glucose, Bld: 102 mg/dL — ABNORMAL HIGH (ref 70–99)
Potassium: 4.3 mmol/L (ref 3.5–5.1)
Sodium: 138 mmol/L (ref 135–145)

## 2022-03-23 LAB — CBC
HCT: 40.1 % (ref 36.0–46.0)
Hemoglobin: 13.1 g/dL (ref 12.0–15.0)
MCH: 28.4 pg (ref 26.0–34.0)
MCHC: 32.7 g/dL (ref 30.0–36.0)
MCV: 86.8 fL (ref 80.0–100.0)
Platelets: 315 10*3/uL (ref 150–400)
RBC: 4.62 MIL/uL (ref 3.87–5.11)
RDW: 13.6 % (ref 11.5–15.5)
WBC: 11.2 10*3/uL — ABNORMAL HIGH (ref 4.0–10.5)
nRBC: 0 % (ref 0.0–0.2)

## 2022-03-23 LAB — TROPONIN I (HIGH SENSITIVITY): Troponin I (High Sensitivity): <2 ng/L (ref <18)

## 2022-03-23 LAB — POC URINE PREG, ED: Preg Test, Ur: NEGATIVE

## 2022-03-23 MED ORDER — ALBUTEROL SULFATE HFA 108 (90 BASE) MCG/ACT IN AERS
2.0000 | INHALATION_SPRAY | RESPIRATORY_TRACT | 1 refills | Status: AC | PRN
Start: 2022-03-23 — End: ?

## 2022-03-23 MED ORDER — AZITHROMYCIN 250 MG PO TABS: ORAL_TABLET | ORAL | 0 refills | Status: AC

## 2022-03-23 MED ORDER — ACETAMINOPHEN 500 MG PO TABS
1000.0000 mg | ORAL_TABLET | Freq: Once | ORAL | Status: AC
  Administered 2022-03-23: 1000 mg via ORAL
  Filled 2022-03-23: qty 2

## 2022-03-23 NOTE — Telephone Encounter (Signed)
Per Dr. Ancil Boozer patient must be seen in person for any refills. Last 2 appts were video visits.

## 2022-03-23 NOTE — ED Triage Notes (Signed)
Pt to ED via POV stating that she has been having chest pain x 3 days. Pt states that yesterday she developed cough and nasal congestion. Pt denies shortness of breath but states that it is painful to breath. Pt is currently in NAD.

## 2022-03-23 NOTE — ED Provider Notes (Signed)
[facility] Provider Note    Event Date/Time   First MD Initiated Contact with Patient 03/23/22 [phone]     (approximate)   History   Chest Pain, Cough, and Nasal Congestion   HPI  [name] is a 36 y.o. female with a history of asthma bronchitis presents to the ER for evaluation of congestion nonproductive cough and chest discomfort the past 24 hours.  Her son is here with similar symptoms including sore throat.  She states she has a scratchy throat as well.  No measured fevers.  Has not taking COVID test.  No nausea or vomiting.  No pain with deep inspiration.  No lower extremity swelling.  She does smoke.     Physical Exam   Triage Vital Signs: ED Triage Vitals  Enc Vitals Group     BP 03/23/22 0816 120/64     Pulse Rate 03/23/22 0816 95     Resp 03/23/22 0816 16     Temp 03/23/22 0816 98 F (36.7 C)     Temp Source 03/23/22 0816 Oral     SpO2 03/23/22 0816 100 %     Weight 03/23/22 0811 198 lb (89.8 kg)     Height 03/23/22 0811 5\' 5"  (1.651 m)     Head Circumference --      Peak Flow --      Pain Score 03/23/22 0811 6     Pain Loc --      Pain Edu? --      Excl. in [place]? --     Most recent vital signs: Vitals:   03/23/22 0830 03/23/22 0900  BP: 125/71 115/63  Pulse: 97 81  Resp: 20 14  Temp:    SpO2: 100% 100%     Constitutional: Alert  Eyes: Conjunctivae are normal.  Head: Atraumatic. Nose: No congestion/rhinnorhea. Mouth/Throat: Mucous membranes are moist.   Neck: Painless ROM.  Cardiovascular:   Good peripheral circulation. Respiratory: Normal respiratory effort.  No retractions.  No wheezing or rales. Gastrointestinal: Soft and nontender.  Musculoskeletal:  no deformity Neurologic:  MAE spontaneously. No gross focal neurologic deficits are appreciated.  Skin:  Skin is warm, dry and intact. No rash noted. Psychiatric: Mood and affect are normal. Speech and behavior are normal.    ED Results / Procedures /  Treatments   Labs (all labs ordered are listed, but only abnormal results are displayed) Labs Reviewed  BASIC METABOLIC PANEL - Abnormal; Notable for the following components:      Result Value   Glucose, Bld 102 (*)    All other components within normal limits  CBC - Abnormal; Notable for the following components:   WBC 11.2 (*)    All other components within normal limits  SARS CORONAVIRUS 2 BY RT PCR  POC URINE PREG, ED  TROPONIN I (HIGH SENSITIVITY)     EKG  ED ECG REPORT I, Merlyn Lot, the attending physician, personally viewed and interpreted this ECG.   Date: 03/23/2022  EKG Time: 8:12  Rate: 90  Rhythm: sinus  Axis: normal  Intervals: normal  ST&T Change: normal     RADIOLOGY Please see ED Course for my review and interpretation.  I personally reviewed all radiographic images ordered to evaluate for the above acute complaints and reviewed radiology reports and findings.  These findings were personally discussed with the patient.  Please see medical record for radiology report.    PROCEDURES:  Critical Care performed:   Procedures  MEDICATIONS ORDERED IN ED: Medications  acetaminophen (TYLENOL) tablet 1,000 mg (1,000 mg Oral Given 03/23/22 0917)     IMPRESSION / MDM / ASSESSMENT AND PLAN / ED COURSE  I reviewed the triage vital signs and the nursing notes.                              Differential diagnosis includes, but is not limited to, URI, pharyngitis, COVID, pneumonia, pneumothorax, bronchitis, COPD, PE, anemia  Patient presenting to the ER for evaluation of symptoms as described above.  Base on symptoms, risk factors and considered above differential, this presenting complaint could reflect a potentially life-threatening illness therefore the patient will be placed on continuous pulse oximetry and telemetry for monitoring.  Laboratory evaluation will be sent to evaluate for the above complaints.  X-ray ordered for by differential.  She  is clinically very well-appearing no significant respiratory symptoms no wheezing on exam.  Work-up will be pursued for the above differential but I suspect this is secondary to probable viral URI given that her son is also sick with similar symptoms.  Will test COVID.  She is low risk by Wells criteria.  Her EKG is nonischemic initial troponin is negative.   Clinical Course as of 03/23/22 [id number]  Fri Mar 23, 2022  0855 Chest x-ray on my review and interpretation does not show any evidence of pneumothorax or consolidation. [PR]  [id number] Patient well appearing.  Reassuring blood work.  Patient does not want to wait for results of COVID test.  Does appear stable and appropriate for outpatient follow-up. [PR]    Clinical Course User Index [PR] Merlyn Lot, MD     FINAL CLINICAL IMPRESSION(S) / ED DIAGNOSES   Final diagnoses:  Upper respiratory tract infection, unspecified type     Rx / DC Orders   ED Discharge Orders          Ordered    albuterol (VENTOLIN HFA) 108 (90 Base) MCG/ACT inhaler  Every 4 hours PRN        03/23/22 0921    azithromycin (ZITHROMAX Z-PAK) 250 MG tablet        03/23/22 [id number]             Note:  This document was prepared using Dragon voice recognition software and may include unintentional dictation errors.    Merlyn Lot, MD 03/23/22 [phone]

## 2022-03-26 NOTE — Telephone Encounter (Signed)
Pt states these refills are supposed to be filled by her psychiarist

## 2022-03-29 ENCOUNTER — Telehealth: Payer: Self-pay | Admitting: Physician Assistant

## 2022-03-29 DIAGNOSIS — J208 Acute bronchitis due to other specified organisms: Secondary | ICD-10-CM

## 2022-03-29 DIAGNOSIS — B9689 Other specified bacterial agents as the cause of diseases classified elsewhere: Secondary | ICD-10-CM

## 2022-03-29 MED ORDER — BENZONATATE 100 MG PO CAPS: 100.0000 mg | ORAL_CAPSULE | Freq: Three times a day (TID) | ORAL | 0 refills | Status: DC | PRN

## 2022-03-29 MED ORDER — DOXYCYCLINE HYCLATE 100 MG PO TABS: 100.0000 mg | ORAL_TABLET | Freq: Two times a day (BID) | ORAL | 0 refills | Status: DC

## 2022-03-29 NOTE — Progress Notes (Signed)

## 2022-03-29 NOTE — Progress Notes (Signed)
I have spent 5 minutes in review of e-visit questionnaire, review and updating patient chart, medical decision making and response to patient.   Bl[place]lee [place] Cody Vernice Mannina, PA-C    

## 2022-04-18 ENCOUNTER — Telehealth: Payer: Self-pay | Admitting: Nurse Practitioner

## 2022-04-18 DIAGNOSIS — R112 Nausea with vomiting, unspecified: Secondary | ICD-10-CM

## 2022-04-18 MED ORDER — ONDANSETRON HCL 4 MG PO TABS: 4.0000 mg | ORAL_TABLET | Freq: Three times a day (TID) | ORAL | 0 refills | Status: DC | PRN

## 2022-04-18 NOTE — Progress Notes (Signed)
E-Visit for Nausea and Vomiting   We are sorry that you are not feeling well. Here is how we plan to help!  Based on what you have shared with me it looks like you have a Virus that is irritating your GI tract.  Vomiting is the forceful emptying of a portion of the stomach's content through the mouth.  Although nausea and vomiting can make you feel miserable, it's important to remember that these are not diseases, but rather symptoms of an underlying illness.  When we treat short term symptoms, we always caution that any symptoms that persist should be fully evaluated in a medical office.  I have prescribed a medication that will help alleviate your symptoms and allow you to stay hydrated:  Zofran 4 mg 1 tablet every 8 hours as needed for nausea and vomiting  HOME CARE: Drink clear liquids.  This is very important! Dehydration (the lack of fluid) can lead to a serious complication.  Start off with 1 tablespoon every 5 minutes for 8 hours. You may begin eating bland foods after 8 hours without vomiting.  Start with saltine crackers, white bread, rice, mashed potatoes, applesauce. After 48 hours on a bland diet, you may resume a normal diet. Try to go to sleep.  Sleep often empties the stomach and relieves the need to vomit.  GET HELP RIGHT AWAY IF:  Your symptoms do not improve or worsen within 2 days after treatment. You have a fever for over 3 days. You cannot keep down fluids after trying the medication.  MAKE SURE YOU:  Understand these instructions. Will watch your condition. Will get help right away if you are not doing well or get worse.    Thank you for choosing an e-visit.  Your e-visit answers were reviewed by a board certified advanced clinical practitioner to complete your personal care plan. Depending upon the condition, your plan could have included both over the counter or prescription medications.  Please review your pharmacy choice. Make sure the pharmacy is open so  you can pick up prescription now. If there is a problem, you may contact your provider through MyChart messaging and have the prescription routed to another pharmacy.  Your safety is important to us. If you have drug allergies check your prescription carefully.   For the next 24 hours you can use MyChart to ask questions about today's visit, request a non-urgent call back, or ask for a work or school excuse. You will get an email in the next two days asking about your experience. I hope that your e-visit has been valuable and will speed your recovery. Danielle Asim Gersten, FNP   5-10 minutes spent reviewing and documenting in chart.  

## 2022-04-19 ENCOUNTER — Other Ambulatory Visit: Payer: Self-pay

## 2022-04-19 ENCOUNTER — Encounter: Payer: Self-pay | Admitting: Emergency Medicine

## 2022-04-19 ENCOUNTER — Emergency Department
Admission: EM | Admit: 2022-04-19 | Discharge: 2022-04-19 | Disposition: A | Discharge status: Home / Self Care | Payer: Medicaid Other | Attending: Emergency Medicine | Admitting: Emergency Medicine

## 2022-04-19 ENCOUNTER — Emergency Department: Payer: Medicaid Other

## 2022-04-19 DIAGNOSIS — M25561 Pain in right knee: Secondary | ICD-10-CM | POA: Insufficient documentation

## 2022-04-19 DIAGNOSIS — X501XXA Overexertion from prolonged static or awkward postures, initial encounter: Secondary | ICD-10-CM | POA: Insufficient documentation

## 2022-04-19 NOTE — ED Triage Notes (Signed)
Pt was stepping over a baby gait and turned and caught her leg. She has pain in her right knee.  Did not fall down.

## 2022-04-19 NOTE — ED Provider Notes (Signed)
[facility] Provider Note  Patient Contact: 4:19 PM (approximate)   History   Knee Pain   HPI  [name] is a 36 y.o. female presents to the emergency department after patient tripped over a baby gate and landed on.  Patient reports that she has had a ligamentous injury on the same side.  She is able to bear weight with some discomfort.  No chest pain, chest tightness or abdominal pain. No abrasions or lacerations.       Physical Exam   Triage Vital Signs: ED Triage Vitals  Enc Vitals Group     BP 04/19/22 1547 117/74     Pulse Rate 04/19/22 1547 99     Resp 04/19/22 1547 16     Temp 04/19/22 1547 98.5 F (36.9 C)     Temp Source 04/19/22 1547 Oral     SpO2 04/19/22 1547 100 %     Weight 04/19/22 1601 197 lb 15.6 oz (89.8 kg)     Height 04/19/22 1601 5\' 5"  (1.651 m)     Head Circumference --      Peak Flow --      Pain Score 04/19/22 1548 7     Pain Loc --      Pain Edu? --      Excl. in [place]? --     Most recent vital signs: Vitals:   04/19/22 1547  BP: 117/74  Pulse: 99  Resp: 16  Temp: 98.5 F (36.9 C)  SpO2: 100%     General: Alert and in no acute distress. Eyes:  PERRL. EOMI. Head: No acute traumatic findings ENT:      Nose: No congestion/rhinnorhea.      Mouth/Throat: Mucous membranes are moist. Neck: No stridor. No cervical spine tenderness to palpation. Cardiovascular:  Good peripheral perfusion Respiratory: Normal respiratory effort without tachypnea or retractions. Lungs CTAB. Good air entry to the bases with no decreased or absent breath sounds. Gastrointestinal: Bowel sounds 4 quadrants. Soft and nontender to palpation. No guarding or rigidity. No palpable masses. No distention. No CVA tenderness. Musculoskeletal: Full range of motion to all extremities.  Neurologic:  No gross focal neurologic deficits are appreciated.  Skin:   No rash noted    ED Results / Procedures / Treatments   Labs (all labs ordered  are listed, but only abnormal results are displayed) Labs Reviewed - No data to display     RADIOLOGY  I personally viewed and evaluated these images as part of my medical decision making, as well as reviewing the written report by the radiologist.  ED Provider Interpretation:  No acute bony abnormality    PROCEDURES:  Critical Care performed: No  Procedures   MEDICATIONS ORDERED IN ED: Medications - No data to display   IMPRESSION / MDM / [place] / ED COURSE  I reviewed the triage vital signs and the nursing notes.                              Assessment and plan Knee pain 36 year old female presents to the emergency department with acute right knee pain  Vital signs are reassuring at triage.  X-ray of right knee unremarkable.  We will discharge patient with meloxicam and crutches as well as a knee sleeve.  Return precautions were given to return with new or worsening symptoms.      FINAL CLINICAL IMPRESSION(S) / ED DIAGNOSES  Final diagnoses:  Acute pain of right knee     Rx / DC Orders   ED Discharge Orders     None        Note:  This document was prepared using Dragon voice recognition software and may include unintentional dictation errors.   Danielle Mare Cochiti, PA-C 04/19/22 1622    Danielle Pear, MD 04/19/22 1630

## 2022-04-21 ENCOUNTER — Telehealth: Payer: Self-pay | Admitting: Family

## 2022-04-21 DIAGNOSIS — K0889 Other specified disorders of teeth and supporting structures: Secondary | ICD-10-CM

## 2022-04-21 DIAGNOSIS — K047 Periapical abscess without sinus: Secondary | ICD-10-CM

## 2022-04-21 MED ORDER — AMOXICILLIN-POT CLAVULANATE 875-125 MG PO TABS: 1.0000 | ORAL_TABLET | Freq: Two times a day (BID) | ORAL | 0 refills | Status: DC

## 2022-04-21 NOTE — Progress Notes (Signed)

## 2022-05-26 ENCOUNTER — Emergency Department
Admission: EM | Admit: 2022-05-26 | Discharge: 2022-05-27 | Disposition: A | Discharge status: Home / Self Care | Payer: Medicaid Other | Attending: Emergency Medicine | Admitting: Emergency Medicine

## 2022-05-26 ENCOUNTER — Other Ambulatory Visit: Payer: Self-pay

## 2022-05-26 DIAGNOSIS — R1031 Right lower quadrant pain: Secondary | ICD-10-CM | POA: Diagnosis not present

## 2022-05-26 LAB — CBC
HCT: 36.5 % (ref 36.0–46.0)
Hemoglobin: 12.1 g/dL (ref 12.0–15.0)
MCH: 28.9 pg (ref 26.0–34.0)
MCHC: 33.2 g/dL (ref 30.0–36.0)
MCV: 87.1 fL (ref 80.0–100.0)
Platelets: 328 10*3/uL (ref 150–400)
RBC: 4.19 MIL/uL (ref 3.87–5.11)
RDW: 13.2 % (ref 11.5–15.5)
WBC: 12.5 10*3/uL — ABNORMAL HIGH (ref 4.0–10.5)
nRBC: 0 % (ref 0.0–0.2)

## 2022-05-26 LAB — URINALYSIS, ROUTINE W REFLEX MICROSCOPIC
Bilirubin Urine: NEGATIVE
Glucose, UA: NEGATIVE mg/dL
Hgb urine dipstick: NEGATIVE
Ketones, ur: NEGATIVE mg/dL
Leukocytes,Ua: NEGATIVE
Nitrite: NEGATIVE
Protein, ur: NEGATIVE mg/dL
Specific Gravity, Urine: 1.017 (ref 1.005–1.030)
pH: 5 (ref 5.0–8.0)

## 2022-05-26 LAB — LIPASE, BLOOD: Lipase: 32 U/L (ref 11–51)

## 2022-05-26 LAB — COMPREHENSIVE METABOLIC PANEL
ALT: 15 U/L (ref 0–44)
AST: 20 U/L (ref 15–41)
Albumin: 3.6 g/dL (ref 3.5–5.0)
Alkaline Phosphatase: 72 U/L (ref 38–126)
Anion gap: 7 (ref 5–15)
BUN: 11 mg/dL (ref 6–20)
CO2: 21 mmol/L — ABNORMAL LOW (ref 22–32)
Calcium: 8.7 mg/dL — ABNORMAL LOW (ref 8.9–10.3)
Chloride: 110 mmol/L (ref 98–111)
Creatinine, Ser: 0.75 mg/dL (ref 0.44–1.00)
GFR, Estimated: >60 mL/min (ref >60)
Glucose, Bld: 108 mg/dL — ABNORMAL HIGH (ref 70–99)
Potassium: 3.5 mmol/L (ref 3.5–5.1)
Sodium: 138 mmol/L (ref 135–145)
Total Bilirubin: 0.6 mg/dL (ref 0.3–1.2)
Total Protein: 7 g/dL (ref 6.5–8.1)

## 2022-05-26 MED ORDER — OXYCODONE-ACETAMINOPHEN 5-325 MG PO TABS
1.0000 | ORAL_TABLET | Freq: Once | ORAL | Status: AC
  Administered 2022-05-26: 1 via ORAL
  Filled 2022-05-26: qty 1

## 2022-05-26 NOTE — ED Provider Notes (Signed)
[facility] Provider Note    Event Date/Time   First MD Initiated Contact with Patient 05/26/22 2336     (approximate)   History   Abdominal Pain   HPI {Remember to add pertinent medical, surgical, social, and/or OB history to HPI:1} [name] is a 36 y.o. female  ***history of bipolar, right lower quadrant and right flank pain since this morning, no associate nausea vomiting diarrhea urinary symptoms or any vaginal bleeding or discharge.  No prior history of this pain.  No change in diet.  Has had hysterectomy and hernia repair but still has her appendix and ovaries.      Physical Exam   Triage Vital Signs: ED Triage Vitals  Enc Vitals Group     BP 05/26/22 2148 113/76     Pulse Rate 05/26/22 2148 85     Resp 05/26/22 2148 19     Temp 05/26/22 2148 98 F (36.7 C)     Temp Source 05/26/22 2148 Oral     SpO2 05/26/22 2148 96 %     Weight 05/26/22 2147 198 lb (89.8 kg)     Height 05/26/22 2147 5\' 5"  (1.651 m)     Head Circumference --      Peak Flow --      Pain Score 05/26/22 2147 7     Pain Loc --      Pain Edu? --      Excl. in [place]? --     Most recent vital signs: Vitals:   05/26/22 2148  BP: 113/76  Pulse: 85  Resp: 19  Temp: 98 F (36.7 C)  SpO2: 96%    {Only need to document appropriate and relevant physical exam:1} General: Awake, no distress. *** CV:  Good peripheral perfusion. *** Resp:  Normal effort. *** Abd:  No distention. *** Other:  ***   ED Results / Procedures / Treatments   Labs (all labs ordered are listed, but only abnormal results are displayed) Labs Reviewed  COMPREHENSIVE METABOLIC PANEL - Abnormal; Notable for the following components:      Result Value   CO2 21 (*)    Glucose, Bld 108 (*)    Calcium 8.7 (*)    All other components within normal limits  CBC - Abnormal; Notable for the following components:   WBC 12.5 (*)    All other components within normal limits  URINALYSIS, ROUTINE W  REFLEX MICROSCOPIC - Abnormal; Notable for the following components:   Color, Urine YELLOW (*)    APPearance HAZY (*)    All other components within normal limits  LIPASE, BLOOD     EKG  ***   RADIOLOGY *** {USE THE WORD "INTERPRETED"!! You MUST document your own interpretation of imaging, as well as the fact that you reviewed the radiologist's report!:1}   PROCEDURES:  Critical Care performed: {CriticalCareYesNo:19197::"Yes, see critical care procedure note(s)","No"}  Procedures   MEDICATIONS ORDERED IN ED: Medications  oxyCODONE-acetaminophen (PERCOCET/ROXICET) 5-325 MG per tablet 1 tablet (has no administration in time range)     IMPRESSION / MDM / ASSESSMENT AND PLAN / ED COURSE  I reviewed the triage vital signs and the nursing notes.                              Differential diagnosis includes, but is not limited to, ***  Patient's presentation is most consistent with {EM COPA:27473}  *** {If the patient is  on the monitor, remove the brackets and asterisks on the sentence below and remember to document it as a Procedure as well. Otherwise delete the sentence below:1} {**The patient is on the cardiac monitor to evaluate for evidence of arrhythmia and/or significant heart rate changes.**} {Remember to include, when applicable, any/all of the following data: independent review of imaging independent review of labs (comment specifically on pertinent positives and negatives) review of specific prior hospitalizations, PCP/specialist notes, etc. discuss meds given and prescribed document any discussion with consultants (including hospitalists) any clinical decision tools you used and why (PECARN, NEXUS, etc.) did you consider admitting the patient? document social determinants of health affecting patient's care (homelessness, inability to follow up in a timely fashion, etc) document any pre-existing conditions increasing risk on current visit (e.g. diabetes and HTN  increasing danger of high-risk chest pain/ACS) describes what meds you gave (especially parenteral) and why any other interventions?:1}     FINAL CLINICAL IMPRESSION(S) / ED DIAGNOSES   Final diagnoses:  None     Rx / DC Orders   ED Discharge Orders     None        Note:  This document was prepared using Dragon voice recognition software and may include unintentional dictation errors.

## 2022-05-26 NOTE — ED Triage Notes (Signed)
RLQ abd pain onset this afternoon. No relief with aleve at home. Nausea w/o emesis. Pt alert and oriented following commands. Breathing unlabored speaking in full sentences. Denies hx of abd surgeries other than hysterectomy. Pain described as sharp. Denies dysuria or other symptoms.

## 2022-05-27 ENCOUNTER — Emergency Department: Payer: Medicaid Other

## 2022-05-27 ENCOUNTER — Other Ambulatory Visit: Payer: Medicaid Other

## 2022-05-27 MED ORDER — ONDANSETRON 8 MG PO TBDP: 8.0000 mg | ORAL_TABLET | Freq: Three times a day (TID) | ORAL | 0 refills | Status: DC | PRN

## 2022-05-27 MED ORDER — IOHEXOL 300 MG/ML  SOLN
100.0000 mL | Freq: Once | INTRAMUSCULAR | Status: AC | PRN
  Administered 2022-05-27: 100 mL via INTRAVENOUS

## 2022-05-27 MED ORDER — IBUPROFEN 600 MG PO TABS: 600.0000 mg | ORAL_TABLET | Freq: Four times a day (QID) | ORAL | 0 refills | Status: DC | PRN

## 2022-05-27 NOTE — Discharge Instructions (Addendum)
Return to the ER for new, worsening, or persistent severe abdominal or flank pain, fevers, vomiting, or any other new or worsening symptoms that concern you.

## 2022-05-27 NOTE — ED Notes (Signed)
Patient transported to CT 

## 2022-05-27 NOTE — ED Notes (Signed)
E signature pad not working. Pt educated on discharge instructions and verbalized understanding.  

## 2022-06-22 ENCOUNTER — Telehealth: Payer: Medicaid Other | Admitting: Physician Assistant

## 2022-06-22 DIAGNOSIS — R112 Nausea with vomiting, unspecified: Secondary | ICD-10-CM

## 2022-06-22 MED ORDER — ONDANSETRON HCL 4 MG PO TABS: 4.0000 mg | ORAL_TABLET | Freq: Three times a day (TID) | ORAL | 0 refills | Status: DC | PRN

## 2022-06-22 NOTE — Progress Notes (Signed)

## 2022-08-09 ENCOUNTER — Other Ambulatory Visit: Payer: Self-pay

## 2022-08-09 ENCOUNTER — Encounter: Payer: Self-pay | Admitting: Emergency Medicine

## 2022-08-09 ENCOUNTER — Emergency Department
Admission: EM | Admit: 2022-08-09 | Discharge: 2022-08-09 | Disposition: A | Discharge status: Home / Self Care | Payer: Medicaid Other | Attending: Emergency Medicine | Admitting: Emergency Medicine

## 2022-08-09 ENCOUNTER — Emergency Department: Payer: Medicaid Other

## 2022-08-09 DIAGNOSIS — R55 Syncope and collapse: Secondary | ICD-10-CM | POA: Insufficient documentation

## 2022-08-09 DIAGNOSIS — R0789 Other chest pain: Secondary | ICD-10-CM | POA: Insufficient documentation

## 2022-08-09 LAB — TROPONIN I (HIGH SENSITIVITY)
Troponin I (High Sensitivity): 2 ng/L (ref <18)
Troponin I (High Sensitivity): 2 ng/L (ref <18)

## 2022-08-09 LAB — CBC
HCT: 40.9 % (ref 36.0–46.0)
Hemoglobin: 13.7 g/dL (ref 12.0–15.0)
MCH: 29 pg (ref 26.0–34.0)
MCHC: 33.5 g/dL (ref 30.0–36.0)
MCV: 86.5 fL (ref 80.0–100.0)
Platelets: 313 10*3/uL (ref 150–400)
RBC: 4.73 MIL/uL (ref 3.87–5.11)
RDW: 13.2 % (ref 11.5–15.5)
WBC: 12.5 10*3/uL — ABNORMAL HIGH (ref 4.0–10.5)
nRBC: 0 % (ref 0.0–0.2)

## 2022-08-09 LAB — BASIC METABOLIC PANEL
Anion gap: 11 (ref 5–15)
BUN: 14 mg/dL (ref 6–20)
CO2: 22 mmol/L (ref 22–32)
Calcium: 9.4 mg/dL (ref 8.9–10.3)
Chloride: 100 mmol/L (ref 98–111)
Creatinine, Ser: 0.83 mg/dL (ref 0.44–1.00)
GFR, Estimated: >60 mL/min (ref >60)
Glucose, Bld: 99 mg/dL (ref 70–99)
Potassium: 3.8 mmol/L (ref 3.5–5.1)
Sodium: 133 mmol/L — ABNORMAL LOW (ref 135–145)

## 2022-08-09 LAB — D-DIMER, QUANTITATIVE: D-Dimer, Quant: 1.09 ug/mL-FEU — ABNORMAL HIGH (ref 0.00–0.50)

## 2022-08-09 MED ORDER — IOHEXOL 350 MG/ML SOLN
75.0000 mL | Freq: Once | INTRAVENOUS | Status: AC | PRN
  Administered 2022-08-09: 75 mL via INTRAVENOUS

## 2022-08-09 NOTE — Discharge Instructions (Signed)
Your workup today has shown overall normal results.  Your CT scan of the chest does show a slightly enlarged lymph node we do recommend you follow-up with your doctor for repeat chest CT in 1 year to ensure no enlargement.  Please drink plenty of fluids obtain plenty of rest.  Return to the emergency department for any further lightheaded dizziness or if you pass out or any other symptom personally concerning to yourself.

## 2022-08-09 NOTE — ED Triage Notes (Signed)
Pt reports while working this am she passed out, states that she went home and is having center chest pain that is worse with taking a deep breath, denies cough,congestion, or fevers recently, states that she has been treated for a uti since Tuesday. Pt denies hx of blood clots

## 2022-08-09 NOTE — ED Provider Notes (Signed)
[facility] Provider Note    Event Date/Time   First MD Initiated Contact with Patient 08/09/22 1153     (approximate)  History   Chief Complaint: Loss of Consciousness and Chest Pain  HPI  [name] is a 37 y.o. female with a past medical history of anxiety, bipolar, gastric reflux, presents to the emergency department after syncopal episode.  According to the patient she was at work this morning around 730 when she states she began feeling somewhat lightheaded and had a syncopal event.  Patient states after going home from work around 830 she developed some pain in the center of her chest and felt like it was somewhat worse if she took a deep breath.  Patient denies any fever.  No history of blood clots.  No estrogen or birth control use.  Currently patient states she feels much better very slight discomfort only if she takes a deep breath.  Physical Exam   Triage Vital Signs: ED Triage Vitals [08/09/22 1032]  Enc Vitals Group     BP 139/64     Pulse Rate (!) 111     Resp 16     Temp 98.8 F (37.1 C)     Temp Source Oral     SpO2 99 %     Weight 206 lb (93.4 kg)     Height 5' 5"$  (1.651 m)     Head Circumference      Peak Flow      Pain Score 7     Pain Loc      Pain Edu?      Excl. in [place]?     Most recent vital signs: Vitals:   08/09/22 1032  BP: 139/64  Pulse: (!) 111  Resp: 16  Temp: 98.8 F (37.1 C)  SpO2: 99%    General: Awake, no distress.  CV:  Good peripheral perfusion.  Regular rate and rhythm  Resp:  Normal effort.  Equal breath sounds bilaterally.  Chest wall is nontender to palpation. Abd:  No distention.  Soft, nontender.  No rebound or guarding. Other:  No calf tenderness or swelling noted to the legs.  ED Results / Procedures / Treatments   EKG  EKG viewed and interpreted by myself shows sinus tachycardia at 101 bpm with a narrow QRS, normal axis, normal intervals, no concerning ST changes.  RADIOLOGY  I  have reviewed and interpreted the chest x-ray images.  No consolidation seen on my evaluation. Radiology has read the chest x-ray is negative.   MEDICATIONS ORDERED IN ED: Medications - No data to display   IMPRESSION / MDM / [place] / ED COURSE  I reviewed the triage vital signs and the nursing notes.  Patient's presentation is most consistent with acute presentation with potential threat to life or bodily function.  Patient presents emergency department for syncopal event at work around 730 this morning and now with some mild chest discomfort.  Overall the patient appears well, reassuring physical exam.  Patient is slightly tachycardic, given her episode of syncope this morning we will obtain lab work including cardiac enzymes x 2 and a D-dimer.  Patient states she is already feeling much better.  Repeat troponin remains negative, however D-dimer is positive.  Given the patient's syncopal episode and mild tachycardia with positive D-dimer we will obtain a CTA of the chest rule out pulmonary embolism.  Patient states she has been experiencing some intermittent discomfort of the right leg although  no tenderness on my exam.  Will obtain an ultrasound to rule out DVT.  Patient agreeable to plan of care.  No distress resting comfortably.  CTA is negative for PE but does show an enlarged lymph node we will have the patient follow-up with her doctor for repeat chest CT in 1 year.  Patient's ultrasounds negative for DVT.  Given the patient's reassuring workup I believe the patient is safe for discharge home.  Patient is feeling much better.  Discussed oral hydration and PCP follow-up.  FINAL CLINICAL IMPRESSION(S) / ED DIAGNOSES   Syncope    Note:  This document was prepared using Dragon voice recognition software and may include unintentional dictation errors.   Harvest Dark, MD 08/09/22 [phone]

## 2022-11-06 ENCOUNTER — Emergency Department
Admission: EM | Admit: 2022-11-06 | Discharge: 2022-11-06 | Disposition: A | Discharge status: Home / Self Care | Payer: Medicaid Other | Attending: Emergency Medicine | Admitting: Emergency Medicine

## 2022-11-06 ENCOUNTER — Other Ambulatory Visit: Payer: Self-pay

## 2022-11-06 ENCOUNTER — Emergency Department: Payer: Medicaid Other

## 2022-11-06 ENCOUNTER — Encounter: Payer: Self-pay | Admitting: Emergency Medicine

## 2022-11-06 DIAGNOSIS — W010XXA Fall on same level from slipping, tripping and stumbling without subsequent striking against object, initial encounter: Secondary | ICD-10-CM | POA: Insufficient documentation

## 2022-11-06 DIAGNOSIS — S7001XA Contusion of right hip, initial encounter: Secondary | ICD-10-CM | POA: Insufficient documentation

## 2022-11-06 DIAGNOSIS — M25561 Pain in right knee: Secondary | ICD-10-CM | POA: Diagnosis not present

## 2022-11-06 DIAGNOSIS — J45909 Unspecified asthma, uncomplicated: Secondary | ICD-10-CM | POA: Diagnosis not present

## 2022-11-06 DIAGNOSIS — S79911A Unspecified injury of right hip, initial encounter: Secondary | ICD-10-CM | POA: Diagnosis present

## 2022-11-06 DIAGNOSIS — R000 Tachycardia, unspecified: Secondary | ICD-10-CM | POA: Diagnosis not present

## 2022-11-06 MED ORDER — ACETAMINOPHEN 325 MG PO TABS
650.0000 mg | ORAL_TABLET | Freq: Once | ORAL | Status: AC
  Administered 2022-11-06: 650 mg via ORAL
  Filled 2022-11-06: qty 2

## 2022-11-06 NOTE — ED Triage Notes (Signed)
Pt to ED for fall Sunday while slipping in mud. C/o right hip and right knee pain.

## 2022-11-06 NOTE — ED Provider Notes (Signed)
[facility] Provider Note    Event Date/Time   First MD Initiated Contact with Patient 11/06/22 [phone]     (approximate)   History   Fall   HPI  [name] is a 37 y.o. female with a past medical history of bipolar disorder, hyperlipidemia, seizure disorder who presents today for evaluation of right hip and knee.  Patient reports that she was fishing over the weekend and slipped in mud and landed on her right hip.  She reports that she has pain from her right hip to her knee.  She denies any weakness in her leg.  She denies paresthesias.  She denies back pain.  She is still been able to ambulate.  She has not noticed any skin changes.  She has not had any swelling.  Patient Active Problem List   Diagnosis Date Noted   Generalized anxiety disorder 08/03/2020   Insomnia due to mental condition 08/03/2020   S/P laparoscopic hysterectomy 12/25/2019   Pelvic pain 11/27/2019   Dyspareunia due to medical condition in female 11/27/2019   Dysmenorrhea 11/27/2019   Radial styloid tenosynovitis 10/13/2019   Bipolar affective disorder (HCC) 01/26/2015   Seizures (HCC) 01/12/2015   Asthma    Hyperlipidemia    Bipolar 2 disorder (HCC)    Allergy    Migraine headache with aura           Physical Exam   Triage Vital Signs: ED Triage Vitals  Enc Vitals Group     BP 11/06/22 0947 (!) 117/91     Pulse Rate 11/06/22 0947 (!) 104     Resp 11/06/22 0947 16     Temp 11/06/22 0947 98.4 F (36.9 C)     Temp src --      SpO2 11/06/22 0947 100 %     Weight 11/06/22 0948 200 lb (90.7 kg)     Height 11/06/22 0948 5\' 5"  (1.651 m)     Head Circumference --      Peak Flow --      Pain Score 11/06/22 0948 7     Pain Loc --      Pain Edu? --      Excl. in [place]? --     Most recent vital signs: Vitals:   11/06/22 0947  BP: (!) 117/91  Pulse: (!) 104  Resp: 16  Temp: 98.4 F (36.9 C)  SpO2: 100%    Physical Exam Vitals and nursing note reviewed.   Constitutional:      General: Awake and alert. No acute distress.    Appearance: Normal appearance. The patient is normal weight.  HENT:     Head: Normocephalic and atraumatic.     Mouth: Mucous membranes are moist.  Eyes:     General: PERRL. Normal EOMs        Right eye: No discharge.        Left eye: No discharge.     Conjunctiva/sclera: Conjunctivae normal.  Cardiovascular:     Rate and Rhythm: Normal rate and regular rhythm.     Pulses: Normal pulses.     Heart sounds: Normal heart sounds Pulmonary:     Effort: Pulmonary effort is normal. No respiratory distress.     Breath sounds: Normal breath sounds.  Abdominal:     Abdomen is soft. There is no abdominal tenderness. No rebound or guarding. No distention. Pelvis stable.  Able to actively and passively range bilateral hips, knees, ankles.  No hematoma or ecchymosis noted.  No swelling noted.  Sensation intact light touch throughout lower extremity and equal to opposite.  2+ pedal pulses.  Normal capillary refill of all toes.  Negative logroll of the hip.  Normal internal and external rotation against resistance at the hip, equal bilaterally. Right knee: No deformity or rash.  Mild lateral joint line tenderness. No patellar tenderness, no ballotment Warm and well perfused extremity with 2+ pedal pulses 5/5 strength to dorsiflexion and plantarflexion at the ankle with intact sensation throughout extremity Normal range of motion of the knee, with intact flexion and extension to active and passive range of motion. Extensor mechanism intact. No ligamentous laxity. Negative anterior/posterior drawer/negative lachman, negative mcmurrays No effusion or warmth Intact quadriceps, hamstring function, patellar tendon function Pelvis stable Full ROM of ankle without pain or swelling Foot warm and well perfused Back: No midline tenderness. Strength and sensation 5/5 to bilateral lower extremities. Normal great toe extension against  resistance. Normal sensation throughout feet. Normal patellar reflexes. Negative SLR and opposite SLR bilaterally. Negative FABER test Musculoskeletal:        General: No swelling. Normal range of motion.     Cervical back: Normal range of motion and neck supple.  Skin:    General: Skin is warm and dry.     Capillary Refill: Capillary refill takes less than 2 seconds.     Findings: No rash.  Neurological:     Mental Status: The patient is awake and alert.      ED Results / Procedures / Treatments   Labs (all labs ordered are listed, but only abnormal results are displayed) Labs Reviewed - No data to display   EKG     RADIOLOGY I independently reviewed and interpreted imaging and agree with radiologists findings.     PROCEDURES:  Critical Care performed:   Procedures   MEDICATIONS ORDERED IN ED: Medications  acetaminophen (TYLENOL) tablet 650 mg (650 mg Oral Given 11/06/22 1029)     IMPRESSION / MDM / ASSESSMENT AND PLAN / ED COURSE  I reviewed the triage vital signs and the nursing notes.   Differential diagnosis includes, but is not limited to, contusion, fracture, dislocation, hematoma, ligamental injury, effusion, lumbar radiculopathy.  Patient is awake and alert, slightly tachycardic on arrival to 104 though anxious in appearance.  She has no obvious swelling, erythema, ecchymosis, or wounds noted.  She has normal active and passive range of motion of the hip and knee.  No ecchymosis, erythema, swelling.  She has normal distal pulses.  There is no hematoma.  Sensation intact light touch throughout.  Normal capillary refill.  I do not suspect vascular injury.  She has no knee effusion, do not suspect complete ligament tear.  X-rays of her knee and hip obtained are negative for any acute findings.  No lower extremity edema, no pitting edema, do not suspect DVT.  She has no vertebral tenderness, negative straight leg raise, no radicular symptoms down her leg, I do  not suspect lumbar radiculopathy.  She has normal strength and sensation of bilateral lower extremities, no saddle anesthesia, no urinary or fecal incontinence or retention, not consistent with cord compression.  Patient is able to ambulate with a steady gait.  I suspect contusion.  We discussed symptomatic management and return precautions.  Patient or stands and agrees with plan.  She was discharged in stable condition.   Patient's presentation is most consistent with acute complicated illness / injury requiring diagnostic workup.   FINAL CLINICAL IMPRESSION(S) / ED DIAGNOSES  Final diagnoses:  Contusion of right hip, initial encounter     Rx / DC Orders   ED Discharge Orders     None        Note:  This document was prepared using Dragon voice recognition software and may include unintentional dictation errors.   Keturah Shavers 11/06/22 1506    Jene Every, MD 11/06/22 [phone]

## 2022-11-06 NOTE — Discharge Instructions (Signed)
Your xrays were normal. You may continue to take tylenol/motrin for your symptoms. It was a pleasure caring for you today.

## 2023-01-07 ENCOUNTER — Other Ambulatory Visit: Payer: Self-pay

## 2023-01-07 ENCOUNTER — Emergency Department
Admission: EM | Admit: 2023-01-07 | Discharge: 2023-01-07 | Disposition: A | Discharge status: Home / Self Care | Payer: MEDICAID | Attending: Emergency Medicine | Admitting: Emergency Medicine

## 2023-01-07 ENCOUNTER — Encounter: Payer: Self-pay | Admitting: Emergency Medicine

## 2023-01-07 DIAGNOSIS — J029 Acute pharyngitis, unspecified: Secondary | ICD-10-CM | POA: Diagnosis present

## 2023-01-07 DIAGNOSIS — Z1152 Encounter for screening for COVID-19: Secondary | ICD-10-CM | POA: Diagnosis not present

## 2023-01-07 DIAGNOSIS — H109 Unspecified conjunctivitis: Secondary | ICD-10-CM | POA: Insufficient documentation

## 2023-01-07 LAB — SARS CORONAVIRUS 2 BY RT PCR: SARS Coronavirus 2 by RT PCR: NEGATIVE

## 2023-01-07 LAB — GROUP A STREP BY PCR: Group A Strep by PCR: NOT DETECTED

## 2023-01-07 MED ORDER — LIDOCAINE VISCOUS HCL 2 % MT SOLN
15.0000 mL | Freq: Once | OROMUCOSAL | Status: AC
  Administered 2023-01-07: 15 mL via OROMUCOSAL
  Filled 2023-01-07: qty 15

## 2023-01-07 MED ORDER — POLYMYXIN B-TRIMETHOPRIM 10000-0.1 UNIT/ML-% OP SOLN: 1.0000 [drp] | Freq: Four times a day (QID) | OPHTHALMIC | 0 refills | Status: DC

## 2023-01-07 NOTE — ED Triage Notes (Signed)
Pt c/o sore throat x1 week along with a dry cough. Also sts concern left eye redness/itchiness.

## 2023-01-07 NOTE — ED Provider Notes (Signed)
[facility] Emergency Department Provider Note     Event Date/Time   First MD Initiated Contact with Patient 01/07/23 2158     (approximate)   History   Sore Throat   HPI  [name] is a 37 y.o. female with a history of anxiety and chronic cough presents to the ED with complaint of sore throat for 3 days.  Patient reports has a recurrent history of strep pharyngitis.  Today symptoms are similar.  Patient reports her throat is irritated when she drinks water. Patient also complains of left eye irritation.  Patient reports noted eye redness today when waking up with green discharge. endorses cough. Denies contact lenses.   Physical Exam   Triage Vital Signs: ED Triage Vitals  Encounter Vitals Group     BP 01/07/23 2150 124/78     Systolic BP Percentile --      Diastolic BP Percentile --      Pulse Rate 01/07/23 2150 99     Resp 01/07/23 2150 16     Temp 01/07/23 2150 98.3 F (36.8 C)     Temp Source 01/07/23 2150 Oral     SpO2 01/07/23 2150 99 %     Weight 01/07/23 2151 190 lb (86.2 kg)     Height 01/07/23 2151 5\' 5"  (1.651 m)     Head Circumference --      Peak Flow --      Pain Score 01/07/23 2150 7     Pain Loc --      Pain Education --      Exclude from Growth Chart --     Most recent vital signs: Vitals:   01/07/23 2150  BP: 124/78  Pulse: 99  Resp: 16  Temp: 98.3 F (36.8 C)  SpO2: 99%    General: Well appearing. Alert and oriented. INAD.  Eyes:  PERRLA. EOMI. Left conjunctiva injected. Small amount of discharge noted in left corner of eyelid. Nose:   Mucosa is moist. No rhinorrhea. Throat: Oropharynx clear. No erythema or exudates. Tonsils no enlarged.  Neck:   No cervical lymphadenopathy.  CV:  Good peripheral perfusion. RRR.  RESP:  Normal effort. LCTAB.    ED Results / Procedures / Treatments   Labs (all labs ordered are listed, but only abnormal results are displayed) Labs Reviewed  GROUP A STREP BY PCR   SARS CORONAVIRUS 2 BY RT PCR   No results found.   PROCEDURES:  Critical Care performed: No  Procedures   MEDICATIONS ORDERED IN ED: Medications  lidocaine (XYLOCAINE) 2 % viscous mouth solution 15 mL (15 mLs Mouth/Throat Given 01/07/23 2241)    IMPRESSION / MDM / ASSESSMENT AND PLAN / ED COURSE  I reviewed the triage vital signs and the nursing notes.                               37 y.o. female presents to the emergency department for evaluation and treatment of sore throat and left eye irritation. See HPI for further details.   Differential diagnosis includes, but is not limited to viral URI, strep pharyngitis, bacterial conjunctivitis, viral conjunctivitis  Physical exam findings are reassuring.  Throat exam is overall benign.  Airway is patent.  The conjunctiva is injected of the left eye.  There is mild discharge in the inner corner of the same eye probably consistent with a possible bacterial conjunctivitis.  I will prescribe  her antibiotic eyedrops.  She is given lidocaine viscous in the ED for relief of her sore throat.  She will be discharged home and encouraged to follow-up with her primary care as needed. Patient is given ED precautions to return to the ED for any worsening or new symptoms. Patient verbalizes understanding. All questions and concerns were addressed during ED visit.    Patient's presentation is most consistent with acute complicated illness / injury requiring diagnostic workup.  FINAL CLINICAL IMPRESSION(S) / ED DIAGNOSES   Final diagnoses:  Viral pharyngitis  Conjunctivitis of left eye, unspecified conjunctivitis type     Rx / DC Orders   ED Discharge Orders          Ordered    trimethoprim-polymyxin b (POLYTRIM) ophthalmic solution  Every 6 hours        01/07/23 2245             Note:  This document was prepared using Dragon voice recognition software and may include unintentional dictation errors.    Romeo Apple, Sholonda Jobst A,  PA-C 01/08/23 0135    Jene Every, MD 01/08/23 [phone]

## 2023-02-28 ENCOUNTER — Ambulatory Visit: Payer: MEDICAID | Admitting: Nurse Practitioner

## 2023-02-28 ENCOUNTER — Encounter: Payer: Self-pay | Admitting: Nurse Practitioner

## 2023-02-28 VITALS — BP 130/84 | HR 118 | Temp 98.7°F | Resp 16 | Ht 65.0 in | Wt 204.2 lb

## 2023-02-28 DIAGNOSIS — D72828 Other elevated white blood cell count: Secondary | ICD-10-CM

## 2023-02-28 DIAGNOSIS — G43119 Migraine with aura, intractable, without status migrainosus: Secondary | ICD-10-CM

## 2023-02-28 DIAGNOSIS — R5383 Other fatigue: Secondary | ICD-10-CM

## 2023-02-28 DIAGNOSIS — R7301 Impaired fasting glucose: Secondary | ICD-10-CM | POA: Diagnosis not present

## 2023-02-28 DIAGNOSIS — E538 Deficiency of other specified B group vitamins: Secondary | ICD-10-CM

## 2023-02-28 DIAGNOSIS — F3131 Bipolar disorder, current episode depressed, mild: Secondary | ICD-10-CM

## 2023-02-28 DIAGNOSIS — E782 Mixed hyperlipidemia: Secondary | ICD-10-CM

## 2023-02-28 DIAGNOSIS — Z79899 Other long term (current) drug therapy: Secondary | ICD-10-CM

## 2023-02-28 DIAGNOSIS — E559 Vitamin D deficiency, unspecified: Secondary | ICD-10-CM

## 2023-02-28 MED ORDER — MELOXICAM 7.5 MG PO TABS: 7.5000 mg | ORAL_TABLET | Freq: Every day | ORAL | 1 refills | Status: DC

## 2023-02-28 MED ORDER — DICYCLOMINE HCL 20 MG PO TABS: 20.0000 mg | ORAL_TABLET | Freq: Three times a day (TID) | ORAL | 2 refills | Status: DC

## 2023-02-28 MED ORDER — ONDANSETRON HCL 4 MG PO TABS: 4.0000 mg | ORAL_TABLET | Freq: Three times a day (TID) | ORAL | 4 refills | Status: DC | PRN

## 2023-02-28 MED ORDER — OMEPRAZOLE 40 MG PO CPDR: 40.0000 mg | DELAYED_RELEASE_CAPSULE | Freq: Every day | ORAL | 2 refills | Status: DC

## 2023-02-28 NOTE — Progress Notes (Signed)
[facility] [address] [place], [place] [address]  Internal MEDICINE  Office Visit Note  Patient Name: [name]  [id number]  [id number]  Date of Service: 02/28/2023   Complaints/HPI Pt is here for establishment of PCP. Chief Complaint  Patient presents with   New Patient (Initial Visit)    IBS, Osteoarthritis in right knee, Heartburn x3 days, Haven't been able to eat in the past 2 weeks after she eats within 20 mins she is getting sick.     HPI Yves presents for a new patient visit to establish care.  Well-appearing 37 y.o. female with IBS, GERD, arthritis, and bipolar.  Work: not working, but psychiatrist is trying to help patient get disability.  Home: live at home with fiance and 2 children.  Diet: so-so Exercise: sometimes goes walking.  Tobacco use: smokes 1.5 ppd, motivated to quit.  Alcohol use: only special occasions, such as holidays. Illicit drug use: none  Routine mammogram: due in 2 years.  Labs: due or routine labs  New or worsening pain: Mixed IBS -- severe cramping, nausea, difficulty eating  GERD -- severe heartburn and nausea, has only tried famotidine.  Right knee arthritis -- mild 3 compartmental osteoarthritis, has not seen ortho yet, not on any medications for arthritis.  Bipolar affective disorder, current depressive episode mild -- sees psychiatrist at [facility], Dr. Jonni Sanger, and sees a therapist, [place].    Current Medication: Outpatient Encounter Medications as of 02/28/2023  Medication Sig   albuterol (VENTOLIN HFA) 108 (90 Base) MCG/ACT inhaler Inhale 2 puffs into the lungs every 4 (four) hours as needed for wheezing or shortness of breath.   ARIPiprazole (ABILIFY) 10 MG tablet Take 10 mg by mouth daily.   dicyclomine (BENTYL) 20 MG tablet Take 1 tablet (20 mg total) by mouth 4 (four) times daily -  before meals and at bedtime. FOR IBS   diltiazem (CARDIZEM) 30 MG tablet Take by mouth.   gabapentin (NEURONTIN) 100 MG capsule Take 1  capsule by mouth 3 (three) times daily as needed.   hydrOXYzine (ATARAX) 25 MG tablet Take 1 tablet by mouth daily.   meloxicam (MOBIC) 7.5 MG tablet Take 1 tablet (7.5 mg total) by mouth daily. For arthritis   omeprazole (PRILOSEC) 40 MG capsule Take 1 capsule (40 mg total) by mouth daily. (Heartburn med)   Oxcarbazepine (TRILEPTAL) 300 MG tablet Take 300 mg by mouth 2 (two) times daily.   topiramate (TOPAMAX) 25 MG tablet Take 25 mg by mouth 2 (two) times daily.   [DISCONTINUED] ondansetron (ZOFRAN) 4 MG tablet Take 1 tablet (4 mg total) by mouth every 8 (eight) hours as needed for nausea or vomiting.   famotidine (PEPCID) 20 MG tablet Take by mouth.   FLUoxetine (PROZAC) 40 MG capsule TAKE ONE CAPSULE BY MOUTH EVERY DAY   ibuprofen (ADVIL) 600 MG tablet Take 1 tablet (600 mg total) by mouth every 6 (six) hours as needed. (Patient not taking: Reported on 02/28/2023)   nortriptyline (PAMELOR) 10 MG capsule Take by mouth.   ondansetron (ZOFRAN) 4 MG tablet Take 1 tablet (4 mg total) by mouth every 8 (eight) hours as needed for nausea or vomiting.   traZODone (DESYREL) 100 MG tablet TAKE ONE TABLET BY MOUTH AT BEDTIME AS NEEDED FOR SLEEP   [DISCONTINUED] trimethoprim-polymyxin b (POLYTRIM) ophthalmic solution Place 1 drop into the left eye every 6 (six) hours. (Patient not taking: Reported on 02/28/2023)   No facility-administered encounter medications on file as of 02/28/2023.  Surgical History: Past Surgical History:  Procedure Laterality Date   ABDOMINAL HYSTERECTOMY     CARPAL TUNNEL RELEASE  2015   left   CYSTOSCOPY  11/27/2019   Procedure: CYSTOSCOPY;  Surgeon: Nadara Mustard, MD;  Location: [facility] ORS;  Service: Gynecology;;   ECTOPIC PREGNANCY SURGERY  2011   HERNIA REPAIR  2010   TOTAL LAPAROSCOPIC HYSTERECTOMY WITH SALPINGECTOMY Bilateral 11/27/2019   Procedure: TOTAL LAPAROSCOPIC HYSTERECTOMY WITH SALPINGECTOMY;  Surgeon: Nadara Mustard, MD;  Location: [facility] ORS;  Service:  Gynecology;  Laterality: Bilateral;  needs RNFA   TUBAL LIGATION      Medical History: Past Medical History:  Diagnosis Date   Allergy    Anxiety    Asthma 11/24/2019   blood allergy testing done 11/24/19 to see what causes it to flare up   Bipolar 1 disorder (HCC)    Bipolar disorder (HCC)    Chronic cough    being worked up with Dr. Jayme Cloud   Depression    Dyspareunia in female 11/2019   Dysrhythmia    TACHY BUT NOT A PROBLEM   Ectopic pregnancy    had surgery   GERD (gastroesophageal reflux disease)    History of concussion 2016   Hyperlipidemia    Migraine    has an aura. happening frequently   MRSA (methicillin resistant Staphylococcus aureus)    was on chin.  many years ago   NSVD (normal spontaneous vaginal delivery)    x 2   Poor dentition    Pseudoseizures    related to anxiety   Scoliosis    Scoliosis     Family History: Family History  Problem Relation Age of Onset   Asthma Mother    Heart disease Mother    Diabetes Mother    Hyperlipidemia Mother    Hypertension Mother    Mental illness Mother    Migraines Mother    Thyroid disease Mother    Stroke Mother    Alcohol abuse Mother    Bipolar disorder Mother    Anxiety disorder Mother    COPD Mother    Hypertension Sister    Depression Sister    Drug abuse Sister    Cancer Maternal Grandmother    Diabetes Maternal Grandmother    Heart disease Maternal Grandmother    Hyperlipidemia Maternal Grandmother    Hypertension Maternal Grandmother    Kidney disease Maternal Grandmother    Heart disease Paternal Grandfather    Alcohol abuse Paternal Grandfather    Drug abuse Paternal Grandfather     Social History   Socioeconomic History   Marital status: Divorced    Spouse name: Not on file   Number of children: 2   Years of education: Not on file   Highest education level: GED or equivalent  Occupational History   Not on file  Tobacco Use   Smoking status: Every Day    Current packs/day:  0.00    Average packs/day: 1 pack/day for 15.0 years (15.0 ttl pk-yrs)    Types: Cigarettes    Start date: 09/07/2019    Last attempt to quit: 09/23/2019    Years since quitting: 3.4   Smokeless tobacco: Never   Tobacco comments:    did not want to quit but can't inhale w/ coughing  Vaping Use   Vaping status: Never Used  Substance and Sexual Activity   Alcohol use: No    Alcohol/week: 0.0 standard drinks of alcohol   Drug use: No   Sexual  activity: Yes    Partners: Male    Birth control/protection: Surgical, None  Other Topics Concern   Not on file  Social History Narrative   Lives with fiance and 2 kids.   Social Determinants of Health   Financial Resource Strain: Medium Risk (11/12/2019)   Overall Financial Resource Strain (CARDIA)    Difficulty of Paying Living Expenses: Somewhat hard  Food Insecurity: Food Insecurity Present (11/12/2019)   Hunger Vital Sign    Worried About Running Out of Food in the Last Year: Often true    Ran Out of Food in the Last Year: Never true  Transportation Needs: No Transportation Needs (11/12/2019)   PRAPARE - Administrator, Civil Service (Medical): No    Lack of Transportation (Non-Medical): No  Physical Activity: Sufficiently Active (11/12/2019)   Exercise Vital Sign    Days of Exercise per Week: 5 days    Minutes of Exercise per Session: 80 min  Stress: Stress Concern Present (11/12/2019)   [employer] of Occupational Health - Occupational Stress Questionnaire    Feeling of Stress : Very much  Social Connections: Moderately Isolated (11/12/2019)   Social Connection and Isolation Panel [NHANES]    Frequency of Communication with Friends and Family: More than three times a week    Frequency of Social Gatherings with Friends and Family: Once a week    Attends Religious Services: Never    Database administrator or Organizations: No    Attends Banker Meetings: Never    Marital Status: Living with partner   Intimate Partner Violence: Not At Risk (11/12/2019)   Humiliation, Afraid, Rape, and Kick questionnaire    Fear of Current or Ex-Partner: No    Emotionally Abused: No    Physically Abused: No    Sexually Abused: No     Review of Systems  Constitutional:  Negative for chills, fatigue and unexpected weight change.  HENT:  Negative for congestion, postnasal drip, rhinorrhea, sneezing and sore throat.   Eyes:  Negative for redness.  Respiratory:  Negative for cough, chest tightness and shortness of breath.   Cardiovascular:  Negative for chest pain and palpitations.  Gastrointestinal:  Negative for abdominal pain, constipation, diarrhea, nausea and vomiting.  Genitourinary:  Negative for dysuria and frequency.  Musculoskeletal:  Negative for arthralgias, back pain, joint swelling and neck pain.  Skin:  Negative for rash.  Neurological: Negative.  Negative for tremors and numbness.  Hematological:  Negative for adenopathy. Does not bruise/bleed easily.  Psychiatric/Behavioral:  Negative for behavioral problems (Depression), sleep disturbance and suicidal ideas. The patient is not nervous/anxious.     Vital Signs: BP 130/84   Pulse (!) 118   Temp 98.7 F (37.1 C)   Resp 16   Ht 5\' 5"  (1.651 m)   Wt 204 lb 3.2 oz (92.6 kg)   LMP 11/09/2019   SpO2 97%   BMI 33.98 kg/m    Physical Exam Vitals reviewed.  Constitutional:      General: She is not in acute distress.    Appearance: Normal appearance. She is obese. She is not ill-appearing.  HENT:     Head: Normocephalic and atraumatic.  Eyes:     Pupils: Pupils are equal, round, and reactive to light.  Cardiovascular:     Rate and Rhythm: Normal rate and regular rhythm.  Pulmonary:     Effort: Pulmonary effort is normal. No respiratory distress.  Neurological:     Mental Status: She is alert  and oriented to person, place, and time.  Psychiatric:        Mood and Affect: Mood normal.        Behavior: Behavior normal.        Assessment/Plan: 1. Impaired fasting glucose Routine labs ordered  - CBC with Differential/Platelet - CMP14+EGFR - Lipid Profile - Hgb A1C w/o eAG - B12 and Folate Panel - TSH + free T4  2. Other elevated white blood cell (WBC) count Routine lab ordered  - CBC with Differential/Platelet  3. Intractable migraine with aura without status migrainosus Stable, routine labs ordered  - CBC with Differential/Platelet - CMP14+EGFR - Lipid Profile - Hgb A1C w/o eAG - B12 and Folate Panel - TSH + free T4  4. Other fatigue Routine labs ordered and additional for further evaluation  - CBC with Differential/Platelet - CMP14+EGFR - Lipid Profile - Hgb A1C w/o eAG - B12 and Folate Panel - TSH + free T4  5. Mixed hyperlipidemia Routine labs ordered  - CBC with Differential/Platelet - CMP14+EGFR - Lipid Profile - Hgb A1C w/o eAG - B12 and Folate Panel - TSH + free T4  6. B12 deficiency Routine labs ordered  - CBC with Differential/Platelet - B12 and Folate Panel  7. Vitamin D deficiency Routine lab ordered  - Vitamin D (25 hydroxy)  8. Encounter for medication review Medication list reviewed, updated and refills ordered  - famotidine (PEPCID) 20 MG tablet; Take by mouth. - diltiazem (CARDIZEM) 30 MG tablet; Take by mouth. - nortriptyline (PAMELOR) 10 MG capsule; Take by mouth. - meloxicam (MOBIC) 7.5 MG tablet; Take 1 tablet (7.5 mg total) by mouth daily. For arthritis  Dispense: 30 tablet; Refill: 1 - ondansetron (ZOFRAN) 4 MG tablet; Take 1 tablet (4 mg total) by mouth every 8 (eight) hours as needed for nausea or vomiting.  Dispense: 20 tablet; Refill: 4 - omeprazole (PRILOSEC) 40 MG capsule; Take 1 capsule (40 mg total) by mouth daily. (Heartburn med)  Dispense: 30 capsule; Refill: 2 - dicyclomine (BENTYL) 20 MG tablet; Take 1 tablet (20 mg total) by mouth 4 (four) times daily -  before meals and at bedtime. FOR IBS  Dispense: 120 tablet; Refill: 2  9.  Bipolar affective disorder, currently depressed, mild (HCC) Stable, on medication.    General Counseling: Sumner verbalizes understanding of the findings of todays visit and agrees with plan of treatment. I have discussed any further diagnostic evaluation that may be needed or ordered today. We also reviewed her medications today. she has been encouraged to call the office with any questions or concerns that should arise related to todays visit.    Orders Placed This Encounter  Procedures   CBC with Differential/Platelet   CMP14+EGFR   Lipid Profile   Hgb A1C w/o eAG   B12 and Folate Panel   Vitamin D (25 hydroxy)   TSH + free T4    Meds ordered this encounter  Medications   meloxicam (MOBIC) 7.5 MG tablet    Sig: Take 1 tablet (7.5 mg total) by mouth daily. For arthritis    Dispense:  30 tablet    Refill:  1    Fill new script today.   ondansetron (ZOFRAN) 4 MG tablet    Sig: Take 1 tablet (4 mg total) by mouth every 8 (eight) hours as needed for nausea or vomiting.    Dispense:  20 tablet    Refill:  4   omeprazole (PRILOSEC) 40 MG capsule    Sig: Take 1  capsule (40 mg total) by mouth daily. (Heartburn med)    Dispense:  30 capsule    Refill:  2   dicyclomine (BENTYL) 20 MG tablet    Sig: Take 1 tablet (20 mg total) by mouth 4 (four) times daily -  before meals and at bedtime. FOR IBS    Dispense:  120 tablet    Refill:  2    Return for CPE, Naylee Frankowski PCP at earliest available opening, please have labs done prior to visit. .  Time spent:30 Minutes Time spent with patient included reviewing progress notes, labs, imaging studies, and discussing plan for follow up.   [place] Controlled Substance Database was reviewed by me for overdose risk score (ORS)   This patient was seen by Sallyanne Kuster, FNP-C in collaboration with Dr. Beverely Risen as a part of collaborative care agreement.   Devany Aja R. Tedd Sias, MSN, FNP-C Internal Medicine

## 2023-03-18 ENCOUNTER — Telehealth: Payer: Self-pay | Admitting: Nurse Practitioner

## 2023-03-18 NOTE — Telephone Encounter (Signed)
Left vm and sent mychart message to confirm 03/28/23 appointment & to get labs done-[name]

## 2023-03-20 LAB — CBC WITH DIFFERENTIAL/PLATELET
Basophils Absolute: 0.1 10*3/uL (ref 0.0–0.2)
Basos: 1 %
EOS (ABSOLUTE): 0.3 10*3/uL (ref 0.0–0.4)
Eos: 3 %
Hematocrit: 41.3 % (ref 34.0–46.6)
Hemoglobin: 13.4 g/dL (ref 11.1–15.9)
Immature Grans (Abs): 0 10*3/uL (ref 0.0–0.1)
Immature Granulocytes: 0 %
Lymphocytes Absolute: 3.2 10*3/uL — ABNORMAL HIGH (ref 0.7–3.1)
Lymphs: 26 %
MCH: 29.3 pg (ref 26.6–33.0)
MCHC: 32.4 g/dL (ref 31.5–35.7)
MCV: 90 fL (ref 79–97)
Monocytes Absolute: 0.8 10*3/uL (ref 0.1–0.9)
Monocytes: 6 %
Neutrophils Absolute: 8 10*3/uL — ABNORMAL HIGH (ref 1.4–7.0)
Neutrophils: 64 %
Platelets: 310 10*3/uL (ref 150–450)
RBC: 4.58 x10E6/uL (ref 3.77–5.28)
RDW: 13.1 % (ref 11.7–15.4)
WBC: 12.5 10*3/uL — ABNORMAL HIGH (ref 3.4–10.8)

## 2023-03-20 LAB — CMP14+EGFR
ALT: 15 IU/L (ref 0–32)
AST: 16 IU/L (ref 0–40)
Albumin: 4.1 g/dL (ref 3.9–4.9)
Alkaline Phosphatase: 99 IU/L (ref 44–121)
BUN/Creatinine Ratio: 9 (ref 9–23)
BUN: 8 mg/dL (ref 6–20)
Bilirubin Total: 0.2 mg/dL (ref 0.0–1.2)
CO2: 23 mmol/L (ref 20–29)
Calcium: 9.1 mg/dL (ref 8.7–10.2)
Chloride: 101 mmol/L (ref 96–106)
Creatinine, Ser: 0.86 mg/dL (ref 0.57–1.00)
Globulin, Total: 2.6 g/dL (ref 1.5–4.5)
Glucose: 94 mg/dL (ref 70–99)
Potassium: 4.1 mmol/L (ref 3.5–5.2)
Sodium: 140 mmol/L (ref 134–144)
Total Protein: 6.7 g/dL (ref 6.0–8.5)
eGFR: 89 mL/min/{1.73_m2} (ref >59)

## 2023-03-20 LAB — LIPID PANEL
Chol/HDL Ratio: 5.6 ratio — ABNORMAL HIGH (ref 0.0–4.4)
Cholesterol, Total: 219 mg/dL — ABNORMAL HIGH (ref 100–199)
HDL: 39 mg/dL — ABNORMAL LOW (ref >39)
LDL Chol Calc (NIH): 157 mg/dL — ABNORMAL HIGH (ref 0–99)
Triglycerides: 129 mg/dL (ref 0–149)
VLDL Cholesterol Cal: 23 mg/dL (ref 5–40)

## 2023-03-20 LAB — B12 AND FOLATE PANEL
Folate: 4.7 ng/mL (ref >3.0)
Vitamin B-12: 1179 pg/mL (ref 232–1245)

## 2023-03-20 LAB — TSH+FREE T4
Free T4: 1.24 ng/dL (ref 0.82–1.77)
TSH: 1.25 u[IU]/mL (ref 0.450–4.500)

## 2023-03-20 LAB — HGB A1C W/O EAG: Hgb A1c MFr Bld: 5.6 % (ref 4.8–5.6)

## 2023-03-20 LAB — VITAMIN D 25 HYDROXY (VIT D DEFICIENCY, FRACTURES): Vit D, 25-Hydroxy: 56.8 ng/mL (ref 30.0–100.0)

## 2023-03-28 ENCOUNTER — Ambulatory Visit (INDEPENDENT_AMBULATORY_CARE_PROVIDER_SITE_OTHER): Payer: MEDICAID | Admitting: Nurse Practitioner

## 2023-03-28 ENCOUNTER — Encounter: Payer: Self-pay | Admitting: Nurse Practitioner

## 2023-03-28 VITALS — BP 132/74 | HR 113 | Temp 98.6°F | Resp 16 | Ht 65.0 in | Wt 204.0 lb

## 2023-03-28 DIAGNOSIS — E782 Mixed hyperlipidemia: Secondary | ICD-10-CM

## 2023-03-28 DIAGNOSIS — Z0001 Encounter for general adult medical examination with abnormal findings: Secondary | ICD-10-CM | POA: Diagnosis not present

## 2023-03-28 DIAGNOSIS — R000 Tachycardia, unspecified: Secondary | ICD-10-CM

## 2023-03-28 DIAGNOSIS — L02429 Furuncle of limb, unspecified: Secondary | ICD-10-CM

## 2023-03-28 DIAGNOSIS — D72828 Other elevated white blood cell count: Secondary | ICD-10-CM

## 2023-03-28 MED ORDER — DOXYCYCLINE HYCLATE 100 MG PO TABS: 100.0000 mg | ORAL_TABLET | Freq: Two times a day (BID) | ORAL | 0 refills | Status: AC

## 2023-03-28 NOTE — Progress Notes (Signed)
[facility] [address] [place], [place] [address]  Internal MEDICINE  Office Visit Note  Patient Name: [name]  [id number]  [id number]  Date of Service: 03/28/2023  Chief Complaint  Patient presents with   Depression   Gastroesophageal Reflux   Hyperlipidemia   Annual Exam    HPI Candies presents for an annual well visit and physical exam.  Well-appearing 37 y.o. female with migraines, asthma, bipolar, GAD, insomnia, and high cholesterol. New or worsening pain: none Discussed labs: Elevated WBC still, persistent Enlarged lymph node on scan from february Chest pain -- has had EKGs and echo done, seeing dr. Juliann Pares for evaluation. Wil lcontinue to monitor the situation.    Current Medication: Outpatient Encounter Medications as of 03/28/2023  Medication Sig   albuterol (VENTOLIN HFA) 108 (90 Base) MCG/ACT inhaler Inhale 2 puffs into the lungs every 4 (four) hours as needed for wheezing or shortness of breath.   ARIPiprazole (ABILIFY) 10 MG tablet Take 10 mg by mouth daily.   dicyclomine (BENTYL) 20 MG tablet Take 1 tablet (20 mg total) by mouth 4 (four) times daily -  before meals and at bedtime. FOR IBS   diltiazem (CARDIZEM) 30 MG tablet Take by mouth.   doxycycline (VIBRA-TABS) 100 MG tablet Take 1 tablet (100 mg total) by mouth 2 (two) times daily for 10 days. Take with food   famotidine (PEPCID) 20 MG tablet Take by mouth.   gabapentin (NEURONTIN) 100 MG capsule Take 1 capsule by mouth 3 (three) times daily as needed.   hydrOXYzine (ATARAX) 25 MG tablet Take 1 tablet by mouth daily.   ibuprofen (ADVIL) 600 MG tablet Take 1 tablet (600 mg total) by mouth every 6 (six) hours as needed.   meloxicam (MOBIC) 7.5 MG tablet Take 1 tablet (7.5 mg total) by mouth daily. For arthritis   nortriptyline (PAMELOR) 10 MG capsule Take by mouth.   omeprazole (PRILOSEC) 40 MG capsule Take 1 capsule (40 mg total) by mouth daily. (Heartburn med)   ondansetron (ZOFRAN)  4 MG tablet Take 1 tablet (4 mg total) by mouth every 8 (eight) hours as needed for nausea or vomiting.   Oxcarbazepine (TRILEPTAL) 300 MG tablet Take 300 mg by mouth 2 (two) times daily.   topiramate (TOPAMAX) 25 MG tablet Take 25 mg by mouth 2 (two) times daily.   FLUoxetine (PROZAC) 40 MG capsule TAKE ONE CAPSULE BY MOUTH EVERY DAY   traZODone (DESYREL) 100 MG tablet TAKE ONE TABLET BY MOUTH AT BEDTIME AS NEEDED FOR SLEEP   No facility-administered encounter medications on file as of 03/28/2023.    Surgical History: Past Surgical History:  Procedure Laterality Date   ABDOMINAL HYSTERECTOMY     CARPAL TUNNEL RELEASE  2015   left   CYSTOSCOPY  11/27/2019   Procedure: CYSTOSCOPY;  Surgeon: Nadara Mustard, MD;  Location: [facility] ORS;  Service: Gynecology;;   ECTOPIC PREGNANCY SURGERY  2011   HERNIA REPAIR  2010   TOTAL LAPAROSCOPIC HYSTERECTOMY WITH SALPINGECTOMY Bilateral 11/27/2019   Procedure: TOTAL LAPAROSCOPIC HYSTERECTOMY WITH SALPINGECTOMY;  Surgeon: Nadara Mustard, MD;  Location: [facility] OR[facility];  Service: Gynecology;  Laterality: Bilateral;  needs RNFA   TUBAL LIGATION      Medical History: Past Medical History:  Diagnosis Date   Allergy    Anxiety    Asthma 11/24/2019   blood allergy testing done 11/24/19 to see what causes it to flare up   Bipolar 1 disorder (HCC)    Bipolar disorder (  HCC)    Chronic cough    being worked up with Dr. Jayme Cloud   Depression    Dyspareunia in female 11/2019   Dysrhythmia    TACHY BUT NOT A PROBLEM   Ectopic pregnancy    had surgery   GERD (gastroesophageal reflux disease)    History of concussion 2016   Hyperlipidemia    Migraine    has an aura. happening frequently   MRSA (methicillin resistant Staphylococcus aureus)    was on chin.  many years ago   NSVD (normal spontaneous vaginal delivery)    x 2   Poor dentition    Pseudoseizures    related to anxiety   Scoliosis    Scoliosis     Family History: Family History  Problem  Relation Age of Onset   Asthma Mother    Heart disease Mother    Diabetes Mother    Hyperlipidemia Mother    Hypertension Mother    Mental illness Mother    Migraines Mother    Thyroid disease Mother    Stroke Mother    Alcohol abuse Mother    Bipolar disorder Mother    Anxiety disorder Mother    COPD Mother    Hypertension Sister    Depression Sister    Drug abuse Sister    Cancer Maternal Grandmother    Diabetes Maternal Grandmother    Heart disease Maternal Grandmother    Hyperlipidemia Maternal Grandmother    Hypertension Maternal Grandmother    Kidney disease Maternal Grandmother    Heart disease Paternal Grandfather    Alcohol abuse Paternal Grandfather    Drug abuse Paternal Grandfather     Social History   Socioeconomic History   Marital status: Divorced    Spouse name: Not on file   Number of children: 2   Years of education: Not on file   Highest education level: GED or equivalent  Occupational History   Not on file  Tobacco Use   Smoking status: Every Day    Current packs/day: 0.00    Average packs/day: 1 pack/day for 15.0 years (15.0 ttl pk-yrs)    Types: Cigarettes    Start date: 09/07/2019    Last attempt to quit: 09/23/2019    Years since quitting: 3.5   Smokeless tobacco: Never   Tobacco comments:    did not want to quit but can't inhale w/ coughing  Vaping Use   Vaping status: Never Used  Substance and Sexual Activity   Alcohol use: No    Alcohol/week: 0.0 standard drinks of alcohol   Drug use: No   Sexual activity: Yes    Partners: Male    Birth control/protection: Surgical, None  Other Topics Concern   Not on file  Social History Narrative   Lives with fiance and 2 kids.   Social Determinants of Health   Financial Resource Strain: Medium Risk (11/12/2019)   Overall Financial Resource Strain (CARDIA)    Difficulty of Paying Living Expenses: Somewhat hard  Food Insecurity: Food Insecurity Present (11/12/2019)   Hunger Vital Sign     Worried About Running Out of Food in the Last Year: Often true    Ran Out of Food in the Last Year: Never true  Transportation Needs: No Transportation Needs (11/12/2019)   PRAPARE - Administrator, Civil Service (Medical): No    Lack of Transportation (Non-Medical): No  Physical Activity: Sufficiently Active (11/12/2019)   Exercise Vital Sign    Days of Exercise  per Week: 5 days    Minutes of Exercise per Session: 80 min  Stress: Stress Concern Present (11/12/2019)   [employer] of Occupational Health - Occupational Stress Questionnaire    Feeling of Stress : Very much  Social Connections: Moderately Isolated (11/12/2019)   Social Connection and Isolation Panel [NHANES]    Frequency of Communication with Friends and Family: More than three times a week    Frequency of Social Gatherings with Friends and Family: Once a week    Attends Religious Services: Never    Database administrator or Organizations: No    Attends Banker Meetings: Never    Marital Status: Living with partner  Intimate Partner Violence: Not At Risk (11/12/2019)   Humiliation, Afraid, Rape, and Kick questionnaire    Fear of Current or Ex-Partner: No    Emotionally Abused: No    Physically Abused: No    Sexually Abused: No      Review of Systems  Constitutional:  Negative for activity change, appetite change, chills, fatigue, fever and unexpected weight change.  HENT: Negative.  Negative for congestion, ear pain, rhinorrhea, sore throat and trouble swallowing.   Eyes: Negative.   Respiratory: Negative.  Negative for cough, chest tightness, shortness of breath and wheezing.   Cardiovascular:  Positive for chest pain and palpitations.  Gastrointestinal: Negative.  Negative for abdominal pain, blood in stool, constipation, diarrhea, nausea and vomiting.  Endocrine: Negative.   Genitourinary: Negative.  Negative for difficulty urinating, dysuria, frequency, hematuria and urgency.   Musculoskeletal:  Positive for arthralgias. Negative for back pain, joint swelling, myalgias and neck pain.  Skin: Negative.  Negative for rash and wound.  Allergic/Immunologic: Negative.  Negative for immunocompromised state.  Neurological: Negative.  Negative for dizziness, seizures, numbness and headaches.  Hematological: Negative.   Psychiatric/Behavioral: Negative.  Negative for behavioral problems, self-injury and suicidal ideas. The patient is not nervous/anxious.     Vital Signs: BP 132/74   Pulse (!) 113   Temp 98.6 F (37 C)   Resp 16   Ht 5\' 5"  (1.651 m)   Wt 204 lb (92.5 kg)   LMP 11/09/2019   SpO2 97%   BMI 33.95 kg/m    Physical Exam Vitals reviewed.  Constitutional:      General: She is not in acute distress.    Appearance: Normal appearance. She is obese. She is not ill-appearing.  HENT:     Head: Normocephalic and atraumatic.     Right Ear: Tympanic membrane, ear canal and external ear normal. There is no impacted cerumen.     Left Ear: Tympanic membrane, ear canal and external ear normal. There is no impacted cerumen.     Nose: Nose normal. No congestion or rhinorrhea.     Mouth/Throat:     Mouth: Mucous membranes are moist.     Pharynx: Oropharynx is clear. No oropharyngeal exudate or posterior oropharyngeal erythema.  Eyes:     Extraocular Movements: Extraocular movements intact.     Conjunctiva/sclera: Conjunctivae normal.     Pupils: Pupils are equal, round, and reactive to light.  Cardiovascular:     Rate and Rhythm: Normal rate and regular rhythm.     Pulses: Normal pulses.     Heart sounds: Normal heart sounds. No murmur heard. Pulmonary:     Effort: Pulmonary effort is normal. No respiratory distress.     Breath sounds: Normal breath sounds. No wheezing.  Abdominal:     General: Bowel sounds are normal.  There is no distension.     Palpations: Abdomen is soft.     Tenderness: There is no abdominal tenderness.  Musculoskeletal:         General: No swelling or tenderness. Normal range of motion.     Cervical back: Normal range of motion and neck supple.     Right lower leg: No edema.     Left lower leg: No edema.  Lymphadenopathy:     Cervical: No cervical adenopathy.  Skin:    General: Skin is warm and dry.     Capillary Refill: Capillary refill takes less than 2 seconds.  Neurological:     Mental Status: She is alert and oriented to person, place, and time.     Cranial Nerves: No cranial nerve deficit.     Coordination: Coordination normal.     Gait: Gait normal.  Psychiatric:        Mood and Affect: Mood normal.        Behavior: Behavior normal.        Assessment/Plan: 1. Encounter for routine adult health examination with abnormal findings Age-appropriate preventive screenings and vaccinations discussed, annual physical exam completed. Routine labs for health maintenance results reviewed with the patient today. PHM updated.   2. Sinus tachycardia Being evaluated by cardiology and has an upcoming appointment with Dr. Juliann Pares  3. Other elevated white blood cell (WBC) count Referred to heme/onc - Ambulatory referral to Hematology / Oncology  4. Mixed hyperlipidemia Discussed diet and lifestyle modifications, will repeat lipid panel in a few months, if no improvement, will start statin therapy.   5. Boil of lower extremity Antibiotic prescribed  - doxycycline (VIBRA-TABS) 100 MG tablet; Take 1 tablet (100 mg total) by mouth 2 (two) times daily for 10 days. Take with food  Dispense: 20 tablet; Refill: 0     General Counseling: Shaniah verbalizes understanding of the findings of todays visit and agrees with plan of treatment. I have discussed any further diagnostic evaluation that may be needed or ordered today. We also reviewed her medications today. she has been encouraged to call the office with any questions or concerns that should arise related to todays visit.    Orders Placed This Encounter   Procedures   Ambulatory referral to Hematology / Oncology    Meds ordered this encounter  Medications   doxycycline (VIBRA-TABS) 100 MG tablet    Sig: Take 1 tablet (100 mg total) by mouth 2 (two) times daily for 10 days. Take with food    Dispense:  20 tablet    Refill:  0    Return in about 3 months (around 06/28/2023) for F/U, Abby Tucholski PCP.   Total time spent:30 Minutes Time spent includes review of chart, medications, test results, and follow up plan with the patient.   [place] Controlled Substance Database was reviewed by me.  This patient was seen by Sallyanne Kuster, FNP-C in collaboration with Dr. Beverely Risen as a part of collaborative care agreement.  Maimouna Rondeau R. Tedd Sias, MSN, FNP-C Internal medicine

## 2023-03-29 ENCOUNTER — Inpatient Hospital Stay: Payer: MEDICAID | Attending: Internal Medicine | Admitting: Internal Medicine

## 2023-03-29 ENCOUNTER — Ambulatory Visit
Admission: RE | Admit: 2023-03-29 | Discharge: 2023-03-29 | Disposition: A | Discharge status: Home / Self Care | Payer: MEDICAID | Source: Ambulatory Visit | Attending: Internal Medicine | Admitting: Internal Medicine

## 2023-03-29 ENCOUNTER — Encounter: Payer: Self-pay | Admitting: Internal Medicine

## 2023-03-29 ENCOUNTER — Inpatient Hospital Stay: Payer: MEDICAID

## 2023-03-29 VITALS — BP 112/80 | HR 112 | Temp 99.3°F | Wt 201.0 lb

## 2023-03-29 DIAGNOSIS — D72820 Lymphocytosis (symptomatic): Secondary | ICD-10-CM | POA: Diagnosis present

## 2023-03-29 DIAGNOSIS — F17210 Nicotine dependence, cigarettes, uncomplicated: Secondary | ICD-10-CM | POA: Diagnosis not present

## 2023-03-29 DIAGNOSIS — R059 Cough, unspecified: Secondary | ICD-10-CM

## 2023-03-29 DIAGNOSIS — D72828 Other elevated white blood cell count: Secondary | ICD-10-CM | POA: Insufficient documentation

## 2023-03-29 LAB — COMPREHENSIVE METABOLIC PANEL
ALT: 31 U/L (ref 0–44)
AST: 25 U/L (ref 15–41)
Albumin: 3.9 g/dL (ref 3.5–5.0)
Alkaline Phosphatase: 78 U/L (ref 38–126)
Anion gap: 8 (ref 5–15)
BUN: 9 mg/dL (ref 6–20)
CO2: 25 mmol/L (ref 22–32)
Calcium: 9.1 mg/dL (ref 8.9–10.3)
Chloride: 103 mmol/L (ref 98–111)
Creatinine, Ser: 0.78 mg/dL (ref 0.44–1.00)
GFR, Estimated: >60 mL/min (ref >60)
Glucose, Bld: 102 mg/dL — ABNORMAL HIGH (ref 70–99)
Potassium: 3.8 mmol/L (ref 3.5–5.1)
Sodium: 136 mmol/L (ref 135–145)
Total Bilirubin: 0.4 mg/dL (ref 0.3–1.2)
Total Protein: 7.5 g/dL (ref 6.5–8.1)

## 2023-03-29 LAB — LACTATE DEHYDROGENASE: LDH: 148 U/L (ref 98–192)

## 2023-03-29 LAB — CBC WITH DIFFERENTIAL/PLATELET
Abs Immature Granulocytes: 0.08 10*3/uL — ABNORMAL HIGH (ref 0.00–0.07)
Basophils Absolute: 0.1 10*3/uL (ref 0.0–0.1)
Basophils Relative: 1 %
Eosinophils Absolute: 0.2 10*3/uL (ref 0.0–0.5)
Eosinophils Relative: 1 %
HCT: 41.2 % (ref 36.0–46.0)
Hemoglobin: 13.5 g/dL (ref 12.0–15.0)
Immature Granulocytes: 1 %
Lymphocytes Relative: 18 %
Lymphs Abs: 2.6 10*3/uL (ref 0.7–4.0)
MCH: 28.9 pg (ref 26.0–34.0)
MCHC: 32.8 g/dL (ref 30.0–36.0)
MCV: 88.2 fL (ref 80.0–100.0)
Monocytes Absolute: 0.7 10*3/uL (ref 0.1–1.0)
Monocytes Relative: 5 %
Neutro Abs: 11.2 10*3/uL — ABNORMAL HIGH (ref 1.7–7.7)
Neutrophils Relative %: 74 %
Platelets: 349 10*3/uL (ref 150–400)
RBC: 4.67 MIL/uL (ref 3.87–5.11)
RDW: 13.7 % (ref 11.5–15.5)
WBC: 14.8 10*3/uL — ABNORMAL HIGH (ref 4.0–10.5)
nRBC: 0 % (ref 0.0–0.2)

## 2023-03-29 LAB — C-REACTIVE PROTEIN
CRP: 1.7 mg/dL — ABNORMAL HIGH (ref <1.0)
CRP: 1.7 mg/dL — ABNORMAL HIGH (ref <1.0)

## 2023-03-29 NOTE — Assessment & Plan Note (Addendum)
#   LEUCOCYTOSIS/neutrophilia- 12-13-predominant neutrophilia/mild lymphocytosis. I had a Long discussion with the patient regarding multiple etiologies of elevated white count including but not limited to reactive causes like infection; inflammation.  However other etiologies include malignancy/leukemia etc.    Clinically suspicion for malignancy is small.  Recommend checking CBC CMP CRP LDH review of peripheral smear; peripheral blood flow cytometry; BCR ABL FISH.  Chest x-ray.  Discussed the possible need for bone marrow biopsy if any of the above blood work is significant abnormal.  However given the current presentation unlikely that she will need a bone marrow biopsy at this time.  #   March 2024-CTA ER 17 mm lymph node is noted in aortopulmonary window which may be slightly enlarged compared to prior exam of June 28, 2020. Given the slow growth, this most likely is benign, but follow-up CT scan in 12 months can be performed to ensure stability.  Defer to PCP.  # smoking: Discussed with the patient regarding the ill effects of smoking- including but not limited to cardiac lung and vascular diseases and malignancies. Counseled against smoking.  Discussed smoking is one potential reason for elevated white count.  # Bipolar-stable on psych medication.  Thank you Miss. Abernathy for allowing me to participate in the care of your pleasant patient. Please do not hesitate to contact me with questions or concerns in the interim.  # DISPOSITION: # Labs today- CBC CMP;  CRP;  peripheral smear; also peripheral blood flow cytometry; LDH; BCR ABL FISH.  Chest x-ray. . # Follow-up in the next 2 to 3 weeks-MD no labs- Dr.B

## 2023-03-29 NOTE — Progress Notes (Signed)
[facility] OFFICE PROGRESS NOTE  Patient Care Team: Sallyanne Kuster, NP as PCP - General (Nurse Practitioner) Magdalene Patricia, MD (Inactive) as Consulting Physician (Psychiatry) Nadara Mustard, MD as Referring Physician (Obstetrics and Gynecology) Evon Slack, [facility] (Inactive) as Counselor (Psychology) Earna Coder, MD as Consulting Physician (Oncology)   # HEMATOLOGY HISTORY:  # LEUCOCYTOSIS- WBC-; N; L; Hb- platelets   Oncology History   No history exists.      INTERVAL HISTORY: Patient ambulating-independently-   Accompanied by Jene Every 37 y.o.  female pleasant patient history history of bipolar disorder chronic cough history of smoking chronic back pain of referred to hematology re: elevated white count.   Patient has chronic cough.  Chronic shortness of breath on exertion.  Always tired.  Not worse.  Patient gaining weight because of her psychiatric medication. Night sweats: none.  Weight loss: none.  Early satiety:poor.   Infections:NONE Splenectomy: NONE Steroids: NONE Smoke: 1.5ppd.   Review of Systems  Constitutional:  Positive for malaise/fatigue. Negative for chills, diaphoresis, fever and weight loss.  HENT:  Negative for nosebleeds and sore throat.   Eyes:  Negative for double vision.  Respiratory:  Positive for cough and shortness of breath. Negative for hemoptysis, sputum production and wheezing.   Cardiovascular:  Negative for chest pain, palpitations, orthopnea and leg swelling.  Gastrointestinal:  Negative for abdominal pain, blood in stool, constipation, diarrhea, heartburn, melena, nausea and vomiting.  Genitourinary:  Negative for dysuria, frequency and urgency.  Musculoskeletal:  Negative for back pain and joint pain.  Skin: Negative.  Negative for itching and rash.  Neurological:  Negative for dizziness, tingling, focal weakness, weakness and headaches.  Endo/Heme/Allergies:  Does not  bruise/bleed easily.  Psychiatric/Behavioral:  Negative for depression. The patient is not nervous/anxious and does not have insomnia.       PAST MEDICAL HISTORY :  Past Medical History:  Diagnosis Date   Allergy    Anxiety    Asthma 11/24/2019   blood allergy testing done 11/24/19 to see what causes it to flare up   Bipolar 1 disorder (HCC)    Bipolar disorder (HCC)    Chronic cough    being worked up with Dr. Jayme Cloud   Depression    Dyspareunia in female 11/2019   Dysrhythmia    TACHY BUT NOT A PROBLEM   Ectopic pregnancy    had surgery   GERD (gastroesophageal reflux disease)    History of concussion 2016   Hyperlipidemia    Migraine    has an aura. happening frequently   MRSA (methicillin resistant Staphylococcus aureus)    was on chin.  many years ago   NSVD (normal spontaneous vaginal delivery)    x 2   Poor dentition    Pseudoseizures    related to anxiety   Scoliosis    Scoliosis     PAST SURGICAL HISTORY :   Past Surgical History:  Procedure Laterality Date   ABDOMINAL HYSTERECTOMY     CARPAL TUNNEL RELEASE  2015   left   CYSTOSCOPY  11/27/2019   Procedure: CYSTOSCOPY;  Surgeon: Nadara Mustard, MD;  Location: [facility] ORS;  Service: Gynecology;;   ECTOPIC PREGNANCY SURGERY  2011   HERNIA REPAIR  2010   TOTAL LAPAROSCOPIC HYSTERECTOMY WITH SALPINGECTOMY Bilateral 11/27/2019   Procedure: TOTAL LAPAROSCOPIC HYSTERECTOMY WITH SALPINGECTOMY;  Surgeon: Nadara Mustard, MD;  Location: [facility] ORS;  Service: Gynecology;  Laterality: Bilateral;  needs RNFA  TUBAL LIGATION      FAMILY HISTORY :   Family History  Problem Relation Age of Onset   Asthma Mother    Heart disease Mother    Diabetes Mother    Hyperlipidemia Mother    Hypertension Mother    Mental illness Mother    Migraines Mother    Thyroid disease Mother    Stroke Mother    Alcohol abuse Mother    Bipolar disorder Mother    Anxiety disorder Mother    COPD Mother    Hypertension Sister     Depression Sister    Drug abuse Sister    Cancer Maternal Grandmother    Diabetes Maternal Grandmother    Heart disease Maternal Grandmother    Hyperlipidemia Maternal Grandmother    Hypertension Maternal Grandmother    Kidney disease Maternal Grandmother    Heart disease Paternal Grandfather    Alcohol abuse Paternal Grandfather    Drug abuse Paternal Grandfather     SOCIAL HISTORY:   Social History   Tobacco Use   Smoking status: Every Day    Current packs/day: 0.00    Average packs/day: 1 pack/day for 15.0 years (15.0 ttl pk-yrs)    Types: Cigarettes    Start date: 09/07/2019    Last attempt to quit: 09/23/2019    Years since quitting: 3.5   Smokeless tobacco: Never   Tobacco comments:    did not want to quit but can't inhale w/ coughing  Vaping Use   Vaping status: Never Used  Substance Use Topics   Alcohol use: No    Alcohol/week: 0.0 standard drinks of alcohol   Drug use: No    ALLERGIES:  is allergic to robitussin (alcohol free) [guaifenesin], strawberry extract, tramadol, aspirin, chocolate flavor, and codeine.  MEDICATIONS:  Current Outpatient Medications  Medication Sig Dispense Refill   albuterol (VENTOLIN HFA) 108 (90 Base) MCG/ACT inhaler Inhale 2 puffs into the lungs every 4 (four) hours as needed for wheezing or shortness of breath. 18 g 1   ARIPiprazole (ABILIFY) 10 MG tablet Take 10 mg by mouth daily.     dicyclomine (BENTYL) 20 MG tablet Take 1 tablet (20 mg total) by mouth 4 (four) times daily -  before meals and at bedtime. FOR IBS 120 tablet 2   diltiazem (CARDIZEM) 30 MG tablet Take by mouth.     doxycycline (VIBRA-TABS) 100 MG tablet Take 1 tablet (100 mg total) by mouth 2 (two) times daily for 10 days. Take with food 20 tablet 0   famotidine (PEPCID) 20 MG tablet Take by mouth.     FLUoxetine (PROZAC) 40 MG capsule TAKE ONE CAPSULE BY MOUTH EVERY DAY 30 capsule 1   gabapentin (NEURONTIN) 100 MG capsule Take 1 capsule by mouth 3 (three) times daily  as needed.     hydrOXYzine (ATARAX) 25 MG tablet Take 1 tablet by mouth daily.     ibuprofen (ADVIL) 600 MG tablet Take 1 tablet (600 mg total) by mouth every 6 (six) hours as needed. 30 tablet 0   meloxicam (MOBIC) 7.5 MG tablet Take 1 tablet (7.5 mg total) by mouth daily. For arthritis 30 tablet 1   nortriptyline (PAMELOR) 10 MG capsule Take by mouth.     omeprazole (PRILOSEC) 40 MG capsule Take 1 capsule (40 mg total) by mouth daily. (Heartburn med) 30 capsule 2   ondansetron (ZOFRAN) 4 MG tablet Take 1 tablet (4 mg total) by mouth every 8 (eight) hours as needed for nausea or  vomiting. 20 tablet 4   Oxcarbazepine (TRILEPTAL) 300 MG tablet Take 300 mg by mouth 2 (two) times daily.     topiramate (TOPAMAX) 25 MG tablet Take 25 mg by mouth 2 (two) times daily.     traZODone (DESYREL) 100 MG tablet TAKE ONE TABLET BY MOUTH AT BEDTIME AS NEEDED FOR SLEEP 30 tablet 1   No current facility-administered medications for this visit.    PHYSICAL EXAMINATION:  BP 112/80 (BP Location: Left Arm, Patient Position: Sitting, Cuff Size: Large)   Pulse (!) 112   Temp 99.3 F (37.4 C) (Tympanic)   Wt 201 lb (91.2 kg)   LMP 11/09/2019   SpO2 98%   BMI 33.45 kg/m   Filed Weights   03/29/23 1356  Weight: 201 lb (91.2 kg)    Physical Exam Vitals and nursing note reviewed.  HENT:     Head: Normocephalic and atraumatic.     Mouth/Throat:     Pharynx: Oropharynx is clear.  Eyes:     Extraocular Movements: Extraocular movements intact.     Pupils: Pupils are equal, round, and reactive to light.  Cardiovascular:     Rate and Rhythm: Normal rate and regular rhythm.  Pulmonary:     Comments: Decreased breath sounds bilaterally.  Abdominal:     Palpations: Abdomen is soft.  Musculoskeletal:        General: Normal range of motion.     Cervical back: Normal range of motion.  Skin:    General: Skin is warm.  Neurological:     General: No focal deficit present.     Mental Status: She is alert  and oriented to person, place, and time.  Psychiatric:        Behavior: Behavior normal.        Judgment: Judgment normal.        LABORATORY DATA:  I have reviewed the data as listed    Component Value Date/Time   NA 136 03/29/2023 1510   NA 140 03/19/2023 0844   NA 139 01/07/2014 1911   K 3.8 03/29/2023 1510   K 4.0 01/07/2014 1911   CL 103 03/29/2023 1510   CL 108 (H) 01/07/2014 1911   CO2 25 03/29/2023 1510   CO2 25 01/07/2014 1911   GLUCOSE 102 (H) 03/29/2023 1510   GLUCOSE 122 (H) 01/07/2014 1911   BUN 9 03/29/2023 1510   BUN 8 03/19/2023 0844   BUN 9 01/07/2014 1911   CREATININE 0.78 03/29/2023 1510   CREATININE 0.60 11/12/2019 1139   CALCIUM 9.1 03/29/2023 1510   CALCIUM 8.7 01/07/2014 1911   PROT 7.5 03/29/2023 1510   PROT 6.7 03/19/2023 0844   PROT 7.5 01/07/2014 1911   ALBUMIN 3.9 03/29/2023 1510   ALBUMIN 4.1 03/19/2023 0844   ALBUMIN 3.8 01/07/2014 1911   AST 25 03/29/2023 1510   AST 21 01/07/2014 1911   ALT 31 03/29/2023 1510   ALT 18 01/07/2014 1911   ALKPHOS 78 03/29/2023 1510   ALKPHOS 64 01/07/2014 1911   BILITOT 0.4 03/29/2023 1510   BILITOT 0.2 03/19/2023 0844   BILITOT 0.3 01/07/2014 1911   GFRNONAA >60 03/29/2023 1510   GFRNONAA 119 11/12/2019 1139   GFRAA >60 03/15/2020 1631   GFRAA 138 11/12/2019 1139    No results found for: "SPEP", "UPEP"  Lab Results  Component Value Date   WBC 14.8 (H) 03/29/2023   NEUTROABS 11.2 (H) 03/29/2023   HGB 13.5 03/29/2023   HCT 41.2 03/29/2023   MCV 88.2  03/29/2023   PLT 349 03/29/2023      Chemistry      Component Value Date/Time   NA 136 03/29/2023 1510   NA 140 03/19/2023 0844   NA 139 01/07/2014 1911   K 3.8 03/29/2023 1510   K 4.0 01/07/2014 1911   CL 103 03/29/2023 1510   CL 108 (H) 01/07/2014 1911   CO2 25 03/29/2023 1510   CO2 25 01/07/2014 1911   BUN 9 03/29/2023 1510   BUN 8 03/19/2023 0844   BUN 9 01/07/2014 1911   CREATININE 0.78 03/29/2023 1510   CREATININE 0.60  11/12/2019 1139      Component Value Date/Time   CALCIUM 9.1 03/29/2023 1510   CALCIUM 8.7 01/07/2014 1911   ALKPHOS 78 03/29/2023 1510   ALKPHOS 64 01/07/2014 1911   AST 25 03/29/2023 1510   AST 21 01/07/2014 1911   ALT 31 03/29/2023 1510   ALT 18 01/07/2014 1911   BILITOT 0.4 03/29/2023 1510   BILITOT 0.2 03/19/2023 0844   BILITOT 0.3 01/07/2014 1911       RADIOGRAPHIC STUDIES: I have personally reviewed the radiological images as listed and agreed with the findings in the report. No results found.   ASSESSMENT & PLAN:  Acquired neutrophilia # LEUCOCYTOSIS/neutrophilia- 12-13-predominant neutrophilia/mild lymphocytosis. I had a Long discussion with the patient regarding multiple etiologies of elevated white count including but not limited to reactive causes like infection; inflammation.  However other etiologies include malignancy/leukemia etc.    Clinically suspicion for malignancy is small.  Recommend checking CBC CMP CRP LDH review of peripheral smear; peripheral blood flow cytometry; BCR ABL FISH.  Chest x-ray.  Discussed the possible need for bone marrow biopsy if any of the above blood work is significant abnormal.  However given the current presentation unlikely that she will need a bone marrow biopsy at this time.  #   March 2024-CTA ER 17 mm lymph node is noted in aortopulmonary window which may be slightly enlarged compared to prior exam of June 28, 2020. Given the slow growth, this most likely is benign, but follow-up CT scan in 12 months can be performed to ensure stability.  Defer to PCP.  # smoking: Discussed with the patient regarding the ill effects of smoking- including but not limited to cardiac lung and vascular diseases and malignancies. Counseled against smoking.  Discussed smoking is one potential reason for elevated white count.  # Bipolar-stable on psych medication.  Thank you Miss. Abernathy for allowing me to participate in the care of your pleasant  patient. Please do not hesitate to contact me with questions or concerns in the interim.  # DISPOSITION: # Labs today- CBC CMP;  CRP;  peripheral smear; also peripheral blood flow cytometry; LDH; BCR ABL FISH.  Chest x-ray. . # Follow-up in the next 2 to 3 weeks-MD no labs- Dr.B   Orders Placed This Encounter  Procedures   DG Chest 2 View    Standing Status:   Future    Number of Occurrences:   1    Standing Expiration Date:   03/28/2024    Order Specific Question:   Reason for Exam (SYMPTOM  OR DIAGNOSIS REQUIRED)    Answer:   cough/shortness of breath    Order Specific Question:   Preferred imaging location?    Answer:   [facility]    Order Specific Question:   Is the patient pregnant?    Answer:   No   Comprehensive metabolic panel  Standing Status:   Future    Number of Occurrences:   1    Standing Expiration Date:   03/28/2024   C-reactive protein    Standing Status:   Future    Number of Occurrences:   1    Standing Expiration Date:   03/28/2024   Lactate dehydrogenase    Standing Status:   Future    Number of Occurrences:   1    Standing Expiration Date:   03/28/2024   CBC with Differential/Platelet    Standing Status:   Future    Number of Occurrences:   1    Standing Expiration Date:   03/28/2024   Flow cytometry panel-leukemia/lymphoma work-up    Standing Status:   Future    Number of Occurrences:   1    Standing Expiration Date:   03/28/2024   C-reactive protein    Standing Status:   Future    Number of Occurrences:   1    Standing Expiration Date:   03/28/2024   BCR-ABL1 FISH    Standing Status:   Future    Number of Occurrences:   1    Standing Expiration Date:   03/28/2024   All questions were answered. The patient knows to call the clinic with any problems, questions or concerns.      Earna Coder, MD 03/29/2023 4:46 PM

## 2023-03-29 NOTE — Progress Notes (Signed)
Patient has had some nausea and vomiting, her appetite has decreased in the past week. Sleep issues. Lymph node enlarged.

## 2023-03-30 ENCOUNTER — Encounter: Payer: Self-pay | Admitting: Nurse Practitioner

## 2023-04-02 LAB — BCR-ABL1 FISH
Cells Analyzed: 200
Cells Counted: 200

## 2023-04-04 LAB — COMP PANEL: LEUKEMIA/LYMPHOMA

## 2023-04-07 ENCOUNTER — Encounter: Payer: Self-pay | Admitting: Nurse Practitioner

## 2023-04-15 ENCOUNTER — Encounter: Payer: Self-pay | Admitting: Internal Medicine

## 2023-04-15 ENCOUNTER — Inpatient Hospital Stay (HOSPITAL_BASED_OUTPATIENT_CLINIC_OR_DEPARTMENT_OTHER): Payer: MEDICAID | Admitting: Internal Medicine

## 2023-04-15 VITALS — BP 115/86 | HR 110 | Temp 100.0°F | Wt 208.0 lb

## 2023-04-15 DIAGNOSIS — D72820 Lymphocytosis (symptomatic): Secondary | ICD-10-CM | POA: Diagnosis not present

## 2023-04-15 DIAGNOSIS — D72828 Other elevated white blood cell count: Secondary | ICD-10-CM | POA: Diagnosis not present

## 2023-04-15 NOTE — Assessment & Plan Note (Addendum)
#   LEUCOCYTOSIS/neutrophilia- 12-14- predominant neutrophilia/mild lymphocytosis. REACTIVE-flow cytometry negative; BCR ABL FISH negative.  CRP elevated.-1.7.   # Discussed with the patient that no concern for malignancy based on the above blood work.  Suspect inflammation/smoking.  Discussed that further testing like bone marrow biopsy would not be very helpful.  However if her counts continue to get worse-a bone marrow biopsy could be considered in future.  #   March 2024-CTA ER 17 mm lymph node is noted in aortopulmonary window which may be slightly enlarged compared to prior exam of June 28, 2020. Given the slow growth, this most likely is benign, but follow-up CT scan in 12 months can be performed to ensure stability.  Defer to PCP.  # smoking: Discussed with the patient regarding the ill effects of smoking- including but not limited to cardiac lung and vascular diseases and malignancies. Counseled against smoking.  Discussed smoking is one potential reason for elevated white count.  # Bipolar-stable on psych medication.  # DISPOSITION: # follow up as needed- Dr.B

## 2023-04-15 NOTE — Progress Notes (Signed)
[facility] OFFICE PROGRESS NOTE  Patient Care Team: Sallyanne Kuster, NP as PCP - General (Nurse Practitioner) Magdalene Patricia, MD (Inactive) as Consulting Physician (Psychiatry) Nadara Mustard, MD as Referring Physician (Obstetrics and Gynecology) Evon Slack, [facility] (Inactive) as Counselor (Psychology) Earna Coder, MD as Consulting Physician (Oncology)   # HEMATOLOGY HISTORY:  # LEUCOCYTOSIS- WBC-; N; L; Hb- platelets   Oncology History   No history exists.      INTERVAL HISTORY: Patient ambulating-independently-   Accompanied by fiance  Niel Hummer 37 y.o.  female pleasant patient history history of bipolar disorder chronic cough history of smoking chronic back pain and History of smoking is here to review the results of the blood work ordered for elevated white count.   Patient had an x-ray on 03/29/2023. She is still having some shortness of breath. She is currently on a steroid from her neurologist   Patient has chronic cough.  Chronic shortness of breath on exertion.  Always tired.  Not worse.  Patient gaining weight because of her psychiatric medication.  Review of Systems  Constitutional:  Positive for malaise/fatigue. Negative for chills, diaphoresis, fever and weight loss.  HENT:  Negative for nosebleeds and sore throat.   Eyes:  Negative for double vision.  Respiratory:  Positive for cough and shortness of breath. Negative for hemoptysis, sputum production and wheezing.   Cardiovascular:  Negative for chest pain, palpitations, orthopnea and leg swelling.  Gastrointestinal:  Negative for abdominal pain, blood in stool, constipation, diarrhea, heartburn, melena, nausea and vomiting.  Genitourinary:  Negative for dysuria, frequency and urgency.  Musculoskeletal:  Negative for back pain and joint pain.  Skin: Negative.  Negative for itching and rash.  Neurological:  Negative for dizziness, tingling, focal weakness, weakness and  headaches.  Endo/Heme/Allergies:  Does not bruise/bleed easily.  Psychiatric/Behavioral:  Negative for depression. The patient is not nervous/anxious and does not have insomnia.       PAST MEDICAL HISTORY :  Past Medical History:  Diagnosis Date   Allergy    Anxiety    Asthma 11/24/2019   blood allergy testing done 11/24/19 to see what causes it to flare up   Bipolar 1 disorder (HCC)    Bipolar disorder (HCC)    Chronic cough    being worked up with Dr. Jayme Cloud   Depression    Dyspareunia in female 11/2019   Dysrhythmia    TACHY BUT NOT A PROBLEM   Ectopic pregnancy    had surgery   GERD (gastroesophageal reflux disease)    History of concussion 2016   Hyperlipidemia    Migraine    has an aura. happening frequently   MRSA (methicillin resistant Staphylococcus aureus)    was on chin.  many years ago   NSVD (normal spontaneous vaginal delivery)    x 2   Poor dentition    Pseudoseizures    related to anxiety   Scoliosis    Scoliosis     PAST SURGICAL HISTORY :   Past Surgical History:  Procedure Laterality Date   ABDOMINAL HYSTERECTOMY     CARPAL TUNNEL RELEASE  2015   left   CYSTOSCOPY  11/27/2019   Procedure: CYSTOSCOPY;  Surgeon: Nadara Mustard, MD;  Location: [facility] ORS;  Service: Gynecology;;   ECTOPIC PREGNANCY SURGERY  2011   HERNIA REPAIR  2010   TOTAL LAPAROSCOPIC HYSTERECTOMY WITH SALPINGECTOMY Bilateral 11/27/2019   Procedure: TOTAL LAPAROSCOPIC HYSTERECTOMY WITH SALPINGECTOMY;  Surgeon: Nadara Mustard,  MD;  Location: [facility] ORS;  Service: Gynecology;  Laterality: Bilateral;  needs RNFA   TUBAL LIGATION      FAMILY HISTORY :   Family History  Problem Relation Age of Onset   Asthma Mother    Heart disease Mother    Diabetes Mother    Hyperlipidemia Mother    Hypertension Mother    Mental illness Mother    Migraines Mother    Thyroid disease Mother    Stroke Mother    Alcohol abuse Mother    Bipolar disorder Mother    Anxiety disorder Mother     COPD Mother    Hypertension Sister    Depression Sister    Drug abuse Sister    Cancer Maternal Grandmother    Diabetes Maternal Grandmother    Heart disease Maternal Grandmother    Hyperlipidemia Maternal Grandmother    Hypertension Maternal Grandmother    Kidney disease Maternal Grandmother    Heart disease Paternal Grandfather    Alcohol abuse Paternal Grandfather    Drug abuse Paternal Grandfather     SOCIAL HISTORY:   Social History   Tobacco Use   Smoking status: Every Day    Current packs/day: 0.00    Average packs/day: 1 pack/day for 15.0 years (15.0 ttl pk-yrs)    Types: Cigarettes    Start date: 09/07/2019    Last attempt to quit: 09/23/2019    Years since quitting: 3.5   Smokeless tobacco: Never   Tobacco comments:    did not want to quit but can't inhale w/ coughing  Vaping Use   Vaping status: Never Used  Substance Use Topics   Alcohol use: No    Alcohol/week: 0.0 standard drinks of alcohol   Drug use: No    ALLERGIES:  is allergic to robitussin (alcohol free) [guaifenesin], strawberry extract, tramadol, aspirin, chocolate flavor, and codeine.  MEDICATIONS:  Current Outpatient Medications  Medication Sig Dispense Refill   albuterol (VENTOLIN HFA) 108 (90 Base) MCG/ACT inhaler Inhale 2 puffs into the lungs every 4 (four) hours as needed for wheezing or shortness of breath. 18 g 1   ARIPiprazole (ABILIFY) 10 MG tablet Take 10 mg by mouth daily.     dicyclomine (BENTYL) 20 MG tablet Take 1 tablet (20 mg total) by mouth 4 (four) times daily -  before meals and at bedtime. FOR IBS 120 tablet 2   diltiazem (CARDIZEM) 30 MG tablet Take by mouth.     famotidine (PEPCID) 20 MG tablet Take by mouth.     FLUoxetine (PROZAC) 40 MG capsule TAKE ONE CAPSULE BY MOUTH EVERY DAY 30 capsule 1   gabapentin (NEURONTIN) 100 MG capsule Take 1 capsule by mouth 3 (three) times daily as needed.     hydrOXYzine (ATARAX) 25 MG tablet Take 1 tablet by mouth daily.     ibuprofen  (ADVIL) 600 MG tablet Take 1 tablet (600 mg total) by mouth every 6 (six) hours as needed. 30 tablet 0   meloxicam (MOBIC) 7.5 MG tablet Take 1 tablet (7.5 mg total) by mouth daily. For arthritis 30 tablet 1   nortriptyline (PAMELOR) 10 MG capsule Take by mouth.     omeprazole (PRILOSEC) 40 MG capsule Take 1 capsule (40 mg total) by mouth daily. (Heartburn med) 30 capsule 2   ondansetron (ZOFRAN) 4 MG tablet Take 1 tablet (4 mg total) by mouth every 8 (eight) hours as needed for nausea or vomiting. 20 tablet 4   Oxcarbazepine (TRILEPTAL) 300 MG tablet Take  300 mg by mouth 2 (two) times daily.     topiramate (TOPAMAX) 25 MG tablet Take 25 mg by mouth 2 (two) times daily.     traZODone (DESYREL) 100 MG tablet TAKE ONE TABLET BY MOUTH AT BEDTIME AS NEEDED FOR SLEEP 30 tablet 1   No current facility-administered medications for this visit.    PHYSICAL EXAMINATION:  BP 115/86 (BP Location: Left Arm, Patient Position: Sitting, Cuff Size: Large)   Pulse (!) 110   Temp 100 F (37.8 C) (Tympanic)   Wt 208 lb (94.3 kg)   LMP 11/09/2019   SpO2 99%   BMI 34.61 kg/m   Filed Weights   04/15/23 1509  Weight: 208 lb (94.3 kg)    Physical Exam Vitals and nursing note reviewed.  HENT:     Head: Normocephalic and atraumatic.     Mouth/Throat:     Pharynx: Oropharynx is clear.  Eyes:     Extraocular Movements: Extraocular movements intact.     Pupils: Pupils are equal, round, and reactive to light.  Cardiovascular:     Rate and Rhythm: Normal rate and regular rhythm.  Pulmonary:     Comments: Decreased breath sounds bilaterally.  Abdominal:     Palpations: Abdomen is soft.  Musculoskeletal:        General: Normal range of motion.     Cervical back: Normal range of motion.  Skin:    General: Skin is warm.  Neurological:     General: No focal deficit present.     Mental Status: She is alert and oriented to person, place, and time.  Psychiatric:        Behavior: Behavior normal.         Judgment: Judgment normal.        LABORATORY DATA:  I have reviewed the data as listed    Component Value Date/Time   NA 136 03/29/2023 1510   NA 140 03/19/2023 0844   NA 139 01/07/2014 1911   K 3.8 03/29/2023 1510   K 4.0 01/07/2014 1911   CL 103 03/29/2023 1510   CL 108 (H) 01/07/2014 1911   CO2 25 03/29/2023 1510   CO2 25 01/07/2014 1911   GLUCOSE 102 (H) 03/29/2023 1510   GLUCOSE 122 (H) 01/07/2014 1911   BUN 9 03/29/2023 1510   BUN 8 03/19/2023 0844   BUN 9 01/07/2014 1911   CREATININE 0.78 03/29/2023 1510   CREATININE 0.60 11/12/2019 1139   CALCIUM 9.1 03/29/2023 1510   CALCIUM 8.7 01/07/2014 1911   PROT 7.5 03/29/2023 1510   PROT 6.7 03/19/2023 0844   PROT 7.5 01/07/2014 1911   ALBUMIN 3.9 03/29/2023 1510   ALBUMIN 4.1 03/19/2023 0844   ALBUMIN 3.8 01/07/2014 1911   AST 25 03/29/2023 1510   AST 21 01/07/2014 1911   ALT 31 03/29/2023 1510   ALT 18 01/07/2014 1911   ALKPHOS 78 03/29/2023 1510   ALKPHOS 64 01/07/2014 1911   BILITOT 0.4 03/29/2023 1510   BILITOT 0.2 03/19/2023 0844   BILITOT 0.3 01/07/2014 1911   GFRNONAA >60 03/29/2023 1510   GFRNONAA 119 11/12/2019 1139   GFRAA >60 03/15/2020 1631   GFRAA 138 11/12/2019 1139    No results found for: "SPEP", "UPEP"  Lab Results  Component Value Date   WBC 14.8 (H) 03/29/2023   NEUTROABS 11.2 (H) 03/29/2023   HGB 13.5 03/29/2023   HCT 41.2 03/29/2023   MCV 88.2 03/29/2023   PLT 349 03/29/2023      Chemistry  Component Value Date/Time   NA 136 03/29/2023 1510   NA 140 03/19/2023 0844   NA 139 01/07/2014 1911   K 3.8 03/29/2023 1510   K 4.0 01/07/2014 1911   CL 103 03/29/2023 1510   CL 108 (H) 01/07/2014 1911   CO2 25 03/29/2023 1510   CO2 25 01/07/2014 1911   BUN 9 03/29/2023 1510   BUN 8 03/19/2023 0844   BUN 9 01/07/2014 1911   CREATININE 0.78 03/29/2023 1510   CREATININE 0.60 11/12/2019 1139      Component Value Date/Time   CALCIUM 9.1 03/29/2023 1510   CALCIUM 8.7 01/07/2014  1911   ALKPHOS 78 03/29/2023 1510   ALKPHOS 64 01/07/2014 1911   AST 25 03/29/2023 1510   AST 21 01/07/2014 1911   ALT 31 03/29/2023 1510   ALT 18 01/07/2014 1911   BILITOT 0.4 03/29/2023 1510   BILITOT 0.2 03/19/2023 0844   BILITOT 0.3 01/07/2014 1911       RADIOGRAPHIC STUDIES: I have personally reviewed the radiological images as listed and agreed with the findings in the report. No results found.   ASSESSMENT & PLAN:  Acquired neutrophilia # LEUCOCYTOSIS/neutrophilia- 12-14- predominant neutrophilia/mild lymphocytosis. REACTIVE-flow cytometry negative; BCR ABL FISH negative.  CRP elevated.-1.7.   # Discussed with the patient that no concern for malignancy based on the above blood work.  Suspect inflammation/smoking.  Discussed that further testing like bone marrow biopsy would not be very helpful.  However if her counts continue to get worse-a bone marrow biopsy could be considered in future.  #   March 2024-CTA ER 17 mm lymph node is noted in aortopulmonary window which may be slightly enlarged compared to prior exam of June 28, 2020. Given the slow growth, this most likely is benign, but follow-up CT scan in 12 months can be performed to ensure stability.  Defer to PCP.  # smoking: Discussed with the patient regarding the ill effects of smoking- including but not limited to cardiac lung and vascular diseases and malignancies. Counseled against smoking.  Discussed smoking is one potential reason for elevated white count.  # Bipolar-stable on psych medication.  # DISPOSITION: # follow up as needed- Dr.B    No orders of the defined types were placed in this encounter.  All questions were answered. The patient knows to call the clinic with any problems, questions or concerns.      Earna Coder, MD 04/15/2023 3:35 PM

## 2023-04-15 NOTE — Progress Notes (Signed)
Patient had an x-ray on 03/29/2023. She is still having some shortness of breath. She is currently on a steroid from her neurologist.

## 2023-05-08 ENCOUNTER — Other Ambulatory Visit: Payer: Self-pay | Admitting: Nurse Practitioner

## 2023-05-08 DIAGNOSIS — Z79899 Other long term (current) drug therapy: Secondary | ICD-10-CM

## 2023-05-09 ENCOUNTER — Other Ambulatory Visit: Payer: Self-pay

## 2023-05-09 DIAGNOSIS — Z79899 Other long term (current) drug therapy: Secondary | ICD-10-CM

## 2023-05-09 NOTE — Telephone Encounter (Signed)
Not my pt

## 2023-05-16 ENCOUNTER — Telehealth: Payer: MEDICAID | Admitting: Nurse Practitioner

## 2023-05-16 ENCOUNTER — Encounter: Payer: Self-pay | Admitting: Nurse Practitioner

## 2023-05-16 VITALS — Resp 16 | Ht 65.0 in | Wt 205.0 lb

## 2023-05-16 DIAGNOSIS — E538 Deficiency of other specified B group vitamins: Secondary | ICD-10-CM | POA: Diagnosis not present

## 2023-05-16 DIAGNOSIS — D72828 Other elevated white blood cell count: Secondary | ICD-10-CM

## 2023-05-16 DIAGNOSIS — Z79899 Other long term (current) drug therapy: Secondary | ICD-10-CM

## 2023-05-16 DIAGNOSIS — R232 Flushing: Secondary | ICD-10-CM

## 2023-05-16 MED ORDER — MELOXICAM 7.5 MG PO TABS: 7.5000 mg | ORAL_TABLET | Freq: Every day | ORAL | 3 refills | Status: DC

## 2023-05-16 MED ORDER — FOLIC ACID 1 MG PO TABS: 1.0000 mg | ORAL_TABLET | Freq: Every day | ORAL | 1 refills | Status: DC

## 2023-05-16 NOTE — Progress Notes (Signed)
[facility] [address] [place], [place] [address]  Internal MEDICINE  Telephone Visit  Patient Name: [name]  [id number]  [id number]  Date of Service: 05/16/2023  I connected with the patient at 1145 by telephone and verified the patients identity using two identifiers.   I discussed the limitations, risks, security and privacy concerns of performing an evaluation and management service by telephone and the availability of in person appointments. I also discussed with the patient that there may be a patient responsible charge related to the service.  The patient expressed understanding and agrees to proceed.    Chief Complaint  Patient presents with   Telephone Screen    Tired, and swelling up. Since Saturday been only able to eat twice.    Telephone Assessment    HPI Shanena presents for a telehealth virtual visit for feeling really tired and sweating a lot  Fatigue, nausea, vomiting, sweating, diaphoresis, poor appetite.  Had issues with neutrophilia and was seeing hematology. Was cleared by them without any issues found. Need refill of meloxicam as well   Current Medication: Outpatient Encounter Medications as of 05/16/2023  Medication Sig   albuterol (VENTOLIN HFA) 108 (90 Base) MCG/ACT inhaler Inhale 2 puffs into the lungs every 4 (four) hours as needed for wheezing or shortness of breath.   ARIPiprazole (ABILIFY) 10 MG tablet Take 10 mg by mouth daily.   dicyclomine (BENTYL) 20 MG tablet Take 1 tablet (20 mg total) by mouth 4 (four) times daily -  before meals and at bedtime. FOR IBS   diltiazem (CARDIZEM) 30 MG tablet Take by mouth.   famotidine (PEPCID) 20 MG tablet Take by mouth.   folic acid (FOLVITE) 1 MG tablet Take 1 tablet (1 mg total) by mouth daily.   gabapentin (NEURONTIN) 100 MG capsule Take 1 capsule by mouth 3 (three) times daily as needed.   hydrOXYzine (ATARAX) 25 MG tablet Take 1 tablet by mouth daily.   ibuprofen (ADVIL) 600 MG  tablet Take 1 tablet (600 mg total) by mouth every 6 (six) hours as needed.   nortriptyline (PAMELOR) 10 MG capsule Take by mouth.   omeprazole (PRILOSEC) 40 MG capsule Take 1 capsule (40 mg total) by mouth daily. (Heartburn med)   ondansetron (ZOFRAN) 4 MG tablet Take 1 tablet (4 mg total) by mouth every 8 (eight) hours as needed for nausea or vomiting.   Oxcarbazepine (TRILEPTAL) 300 MG tablet Take 300 mg by mouth 2 (two) times daily.   topiramate (TOPAMAX) 25 MG tablet Take 25 mg by mouth 2 (two) times daily.   [DISCONTINUED] meloxicam (MOBIC) 7.5 MG tablet Take 1 tablet (7.5 mg total) by mouth daily. For arthritis   FLUoxetine (PROZAC) 40 MG capsule TAKE ONE CAPSULE BY MOUTH EVERY DAY   meloxicam (MOBIC) 7.5 MG tablet Take 1 tablet (7.5 mg total) by mouth daily. For arthritis   traZODone (DESYREL) 100 MG tablet TAKE ONE TABLET BY MOUTH AT BEDTIME AS NEEDED FOR SLEEP   No facility-administered encounter medications on file as of 05/16/2023.    Surgical History: Past Surgical History:  Procedure Laterality Date   ABDOMINAL HYSTERECTOMY     CARPAL TUNNEL RELEASE  2015   left   CYSTOSCOPY  11/27/2019   Procedure: CYSTOSCOPY;  Surgeon: Nadara Mustard, MD;  Location: [facility] ORS;  Service: Gynecology;;   ECTOPIC PREGNANCY SURGERY  2011   HERNIA REPAIR  2010   TOTAL LAPAROSCOPIC HYSTERECTOMY WITH SALPINGECTOMY Bilateral 11/27/2019   Procedure: TOTAL LAPAROSCOPIC  HYSTERECTOMY WITH SALPINGECTOMY;  Surgeon: Nadara Mustard, MD;  Location: [facility] ORS;  Service: Gynecology;  Laterality: Bilateral;  needs RNFA   TUBAL LIGATION      Medical History: Past Medical History:  Diagnosis Date   Allergy    Anxiety    Asthma 11/24/2019   blood allergy testing done 11/24/19 to see what causes it to flare up   Bipolar 1 disorder (HCC)    Bipolar disorder (HCC)    Chronic cough    being worked up with Dr. Jayme Cloud   Depression    Dyspareunia in female 11/2019   Dysrhythmia    TACHY BUT NOT A  PROBLEM   Ectopic pregnancy    had surgery   GERD (gastroesophageal reflux disease)    History of concussion 2016   Hyperlipidemia    Migraine    has an aura. happening frequently   MRSA (methicillin resistant Staphylococcus aureus)    was on chin.  many years ago   NSVD (normal spontaneous vaginal delivery)    x 2   Poor dentition    Pseudoseizures    related to anxiety   Scoliosis    Scoliosis     Family History: Family History  Problem Relation Age of Onset   Asthma Mother    Heart disease Mother    Diabetes Mother    Hyperlipidemia Mother    Hypertension Mother    Mental illness Mother    Migraines Mother    Thyroid disease Mother    Stroke Mother    Alcohol abuse Mother    Bipolar disorder Mother    Anxiety disorder Mother    COPD Mother    Hypertension Sister    Depression Sister    Drug abuse Sister    Cancer Maternal Grandmother    Diabetes Maternal Grandmother    Heart disease Maternal Grandmother    Hyperlipidemia Maternal Grandmother    Hypertension Maternal Grandmother    Kidney disease Maternal Grandmother    Heart disease Paternal Grandfather    Alcohol abuse Paternal Grandfather    Drug abuse Paternal Grandfather     Social History   Socioeconomic History   Marital status: Divorced    Spouse name: Not on file   Number of children: 2   Years of education: Not on file   Highest education level: GED or equivalent  Occupational History   Not on file  Tobacco Use   Smoking status: Every Day    Current packs/day: 0.00    Average packs/day: 1 pack/day for 15.0 years (15.0 ttl pk-yrs)    Types: Cigarettes    Start date: 09/07/2019    Last attempt to quit: 09/23/2019    Years since quitting: 3.6   Smokeless tobacco: Never   Tobacco comments:    did not want to quit but can't inhale w/ coughing  Vaping Use   Vaping status: Never Used  Substance and Sexual Activity   Alcohol use: No    Alcohol/week: 0.0 standard drinks of alcohol   Drug  use: No   Sexual activity: Yes    Partners: Male    Birth control/protection: Surgical, None  Other Topics Concern   Not on file  Social History Narrative   Lives with fiance and 2 kids.   Social Determinants of Health   Financial Resource Strain: Medium Risk (11/12/2019)   Overall Financial Resource Strain (CARDIA)    Difficulty of Paying Living Expenses: Somewhat hard  Food Insecurity: Food Insecurity Present (03/29/2023)  Hunger Vital Sign    Worried About Running Out of Food in the Last Year: Sometimes true    Ran Out of Food in the Last Year: Sometimes true  Transportation Needs: No Transportation Needs (03/29/2023)   PRAPARE - Administrator, Civil Service (Medical): No    Lack of Transportation (Non-Medical): No  Physical Activity: Sufficiently Active (11/12/2019)   Exercise Vital Sign    Days of Exercise per Week: 5 days    Minutes of Exercise per Session: 80 min  Stress: Stress Concern Present (11/12/2019)   [employer] of Occupational Health - Occupational Stress Questionnaire    Feeling of Stress : Very much  Social Connections: Moderately Isolated (11/12/2019)   Social Connection and Isolation Panel [NHANES]    Frequency of Communication with Friends and Family: More than three times a week    Frequency of Social Gatherings with Friends and Family: Once a week    Attends Religious Services: Never    Database administrator or Organizations: No    Attends Banker Meetings: Never    Marital Status: Living with partner  Intimate Partner Violence: Not At Risk (03/29/2023)   Humiliation, Afraid, Rape, and Kick questionnaire    Fear of Current or Ex-Partner: No    Emotionally Abused: No    Physically Abused: No    Sexually Abused: No      Review of Systems  Constitutional:  Positive for appetite change, diaphoresis and fatigue.  Respiratory: Negative.  Negative for cough, chest tightness, shortness of breath and wheezing.    Cardiovascular: Negative.  Negative for chest pain and palpitations.  Gastrointestinal:  Positive for nausea and vomiting. Negative for abdominal pain, constipation and diarrhea.  Musculoskeletal:  Positive for arthralgias (knee pain).  Neurological:  Positive for headaches.    Vital Signs: Resp 16   Ht 5\' 5"  (1.651 m)   Wt 205 lb (93 kg)   LMP 11/09/2019   BMI 34.11 kg/m    Observation/Objective: She is alert and oriented. No acute distress noted.     Assessment/Plan: 1. Acquired neutrophilia Labs ordered, follow up in office in 4 weeks.  - CBC with Differential/Platelet  2. Hot flashes Labs ordered, follow up in office in 4 weeks  - FSH/LH - Estradiol  3. Low folate Start folic acid one daily  - folic acid (FOLVITE) 1 MG tablet; Take 1 tablet (1 mg total) by mouth daily.  Dispense: 90 tablet; Refill: 1  4. Encounter for medication review Continue meloxicam as prescribed  - meloxicam (MOBIC) 7.5 MG tablet; Take 1 tablet (7.5 mg total) by mouth daily. For arthritis  Dispense: 30 tablet; Refill: 3   General Counseling: Mardella verbalizes understanding of the findings of today's phone visit and agrees with plan of treatment. I have discussed any further diagnostic evaluation that may be needed or ordered today. We also reviewed her medications today. she has been encouraged to call the office with any questions or concerns that should arise related to todays visit.  Return in about 4 weeks (around 06/13/2023) for F/U, Labs, Fany Cavanaugh PCP, eval new med.   Orders Placed This Encounter  Procedures   CBC with Differential/Platelet   FSH/LH   Estradiol    Meds ordered this encounter  Medications   meloxicam (MOBIC) 7.5 MG tablet    Sig: Take 1 tablet (7.5 mg total) by mouth daily. For arthritis    Dispense:  30 tablet    Refill:  3  Fill new script today.   folic acid (FOLVITE) 1 MG tablet    Sig: Take 1 tablet (1 mg total) by mouth daily.    Dispense:  90 tablet     Refill:  1    Fill new script today    Time spent:20 Minutes Time spent with patient included reviewing progress notes, labs, imaging studies, and discussing plan for follow up.   Controlled Substance Database was reviewed by me for overdose risk score (ORS) if appropriate.  This patient was seen by Sallyanne Kuster, FNP-C in collaboration with Dr. Beverely Risen as a part of collaborative care agreement.  Waldo Damian R. Tedd Sias, MSN, FNP-C Internal medicine

## 2023-06-13 ENCOUNTER — Ambulatory Visit: Payer: MEDICAID | Admitting: Nurse Practitioner

## 2023-06-27 ENCOUNTER — Ambulatory Visit: Payer: MEDICAID | Admitting: Nurse Practitioner

## 2023-07-04 LAB — CBC WITH DIFFERENTIAL/PLATELET
Basophils Absolute: 0.1 10*3/uL (ref 0.0–0.2)
Basos: 1 %
EOS (ABSOLUTE): 0.3 10*3/uL (ref 0.0–0.4)
Eos: 2 %
Hematocrit: 41 % (ref 34.0–46.6)
Hemoglobin: 13.7 g/dL (ref 11.1–15.9)
Immature Grans (Abs): 0.1 10*3/uL (ref 0.0–0.1)
Immature Granulocytes: 1 %
Lymphocytes Absolute: 3.3 10*3/uL — ABNORMAL HIGH (ref 0.7–3.1)
Lymphs: 23 %
MCH: 29.3 pg (ref 26.6–33.0)
MCHC: 33.4 g/dL (ref 31.5–35.7)
MCV: 88 fL (ref 79–97)
Monocytes Absolute: 0.9 10*3/uL (ref 0.1–0.9)
Monocytes: 7 %
Neutrophils Absolute: 9.6 10*3/uL — ABNORMAL HIGH (ref 1.4–7.0)
Neutrophils: 66 %
Platelets: 342 10*3/uL (ref 150–450)
RBC: 4.67 x10E6/uL (ref 3.77–5.28)
RDW: 13.6 % (ref 11.7–15.4)
WBC: 14.2 10*3/uL — ABNORMAL HIGH (ref 3.4–10.8)

## 2023-07-04 LAB — FSH/LH
FSH: 8.9 m[IU]/mL
LH: 13.5 m[IU]/mL

## 2023-07-04 LAB — ESTRADIOL: Estradiol: 59.1 pg/mL

## 2023-07-10 ENCOUNTER — Encounter: Payer: Self-pay | Admitting: Nurse Practitioner

## 2023-07-10 ENCOUNTER — Ambulatory Visit: Payer: MEDICAID | Admitting: Nurse Practitioner

## 2023-07-10 ENCOUNTER — Telehealth: Payer: Self-pay

## 2023-07-10 ENCOUNTER — Telehealth: Payer: Self-pay | Admitting: Nurse Practitioner

## 2023-07-10 VITALS — BP 128/74 | HR 116 | Temp 98.5°F | Resp 16 | Ht 65.0 in | Wt 213.2 lb

## 2023-07-10 DIAGNOSIS — R0602 Shortness of breath: Secondary | ICD-10-CM | POA: Diagnosis not present

## 2023-07-10 DIAGNOSIS — J4540 Moderate persistent asthma, uncomplicated: Secondary | ICD-10-CM

## 2023-07-10 DIAGNOSIS — Z79899 Other long term (current) drug therapy: Secondary | ICD-10-CM

## 2023-07-10 DIAGNOSIS — R112 Nausea with vomiting, unspecified: Secondary | ICD-10-CM

## 2023-07-10 DIAGNOSIS — R599 Enlarged lymph nodes, unspecified: Secondary | ICD-10-CM | POA: Diagnosis not present

## 2023-07-10 MED ORDER — DICYCLOMINE HCL 20 MG PO TABS: 20.0000 mg | ORAL_TABLET | Freq: Three times a day (TID) | ORAL | 2 refills | Status: DC

## 2023-07-10 MED ORDER — MOMETASONE FURO-FORMOTEROL FUM 100-5 MCG/ACT IN AERO: 2.0000 | INHALATION_SPRAY | Freq: Two times a day (BID) | RESPIRATORY_TRACT | 5 refills | Status: DC

## 2023-07-10 MED ORDER — ONDANSETRON HCL 4 MG PO TABS: 4.0000 mg | ORAL_TABLET | Freq: Three times a day (TID) | ORAL | 4 refills | Status: DC | PRN

## 2023-07-10 MED ORDER — MELOXICAM 7.5 MG PO TABS: 7.5000 mg | ORAL_TABLET | Freq: Every day | ORAL | 3 refills | Status: DC

## 2023-07-10 MED ORDER — OMEPRAZOLE 40 MG PO CPDR: 40.0000 mg | DELAYED_RELEASE_CAPSULE | Freq: Every day | ORAL | 2 refills | Status: DC

## 2023-07-10 NOTE — Progress Notes (Signed)
[facility] [address] [place], [place] [address]  Internal MEDICINE  Office Visit Note  Patient Name: [name]  [id number]  [id number]  Date of Service: 07/10/2023  Chief Complaint  Patient presents with   Depression   Gastroesophageal Reflux   Hyperlipidemia   Follow-up    HPI [name] presents for a follow-up visit for SOB, nausea, and an enlarged lymph node.  SOB with minimal exertion and cough possibly related to prior diagnosis of asthma and chronic bronchitis.  Nausea -- reports that this is constant, if she does not take a nausea medication, and then eats, she reports that she will vomit.  Enlarged lymph node -- found last year on CT angio chest that was 1.7 cm in diameter in the aortopulmonary window. Recommendation is to repeat CT chest in a year. It will be a year since the scan in February.  Also reviewed labs with patient.    Current Medication: Outpatient Encounter Medications as of 07/10/2023  Medication Sig   albuterol (VENTOLIN HFA) 108 (90 Base) MCG/ACT inhaler Inhale 2 puffs into the lungs every 4 (four) hours as needed for wheezing or shortness of breath.   ARIPiprazole (ABILIFY) 10 MG tablet Take 10 mg by mouth daily.   diltiazem (CARDIZEM) 30 MG tablet Take by mouth.   famotidine (PEPCID) 20 MG tablet Take by mouth.   folic acid (FOLVITE) 1 MG tablet Take 1 tablet (1 mg total) by mouth daily.   gabapentin (NEURONTIN) 100 MG capsule Take 1 capsule by mouth 3 (three) times daily as needed.   hydrOXYzine (ATARAX) 25 MG tablet Take 1 tablet by mouth daily.   ibuprofen (ADVIL) 600 MG tablet Take 1 tablet (600 mg total) by mouth every 6 (six) hours as needed.   mometasone-formoterol (DULERA) 100-5 MCG/ACT AERO Inhale 2 puffs into the lungs in the morning and at bedtime.   nortriptyline (PAMELOR) 10 MG capsule Take by mouth.   Oxcarbazepine (TRILEPTAL) 300 MG tablet Take 300 mg by mouth 2 (two) times daily.   topiramate (TOPAMAX) 25 MG tablet  Take 25 mg by mouth 2 (two) times daily.   [DISCONTINUED] dicyclomine (BENTYL) 20 MG tablet Take 1 tablet (20 mg total) by mouth 4 (four) times daily -  before meals and at bedtime. FOR IBS   [DISCONTINUED] meloxicam (MOBIC) 7.5 MG tablet Take 1 tablet (7.5 mg total) by mouth daily. For arthritis   [DISCONTINUED] omeprazole (PRILOSEC) 40 MG capsule Take 1 capsule (40 mg total) by mouth daily. (Heartburn med)   [DISCONTINUED] ondansetron (ZOFRAN) 4 MG tablet Take 1 tablet (4 mg total) by mouth every 8 (eight) hours as needed for nausea or vomiting.   dicyclomine (BENTYL) 20 MG tablet Take 1 tablet (20 mg total) by mouth 4 (four) times daily -  before meals and at bedtime. FOR IBS   FLUoxetine (PROZAC) 40 MG capsule TAKE ONE CAPSULE BY MOUTH EVERY DAY   meloxicam (MOBIC) 7.5 MG tablet Take 1 tablet (7.5 mg total) by mouth daily. For arthritis   omeprazole (PRILOSEC) 40 MG capsule Take 1 capsule (40 mg total) by mouth daily. (Heartburn med)   ondansetron (ZOFRAN) 4 MG tablet Take 1 tablet (4 mg total) by mouth every 8 (eight) hours as needed for nausea or vomiting.   traZODone (DESYREL) 100 MG tablet TAKE ONE TABLET BY MOUTH AT BEDTIME AS NEEDED FOR SLEEP   No facility-administered encounter medications on file as of 07/10/2023.    Surgical History: Past Surgical History:  Procedure  Laterality Date   ABDOMINAL HYSTERECTOMY     CARPAL TUNNEL RELEASE  2015   left   CYSTOSCOPY  11/27/2019   Procedure: CYSTOSCOPY;  Surgeon: Nadara Mustard, MD;  Location: [facility] ORS;  Service: Gynecology;;   ECTOPIC PREGNANCY SURGERY  2011   HERNIA REPAIR  2010   TOTAL LAPAROSCOPIC HYSTERECTOMY WITH SALPINGECTOMY Bilateral 11/27/2019   Procedure: TOTAL LAPAROSCOPIC HYSTERECTOMY WITH SALPINGECTOMY;  Surgeon: Nadara Mustard, MD;  Location: [facility] ORS;  Service: Gynecology;  Laterality: Bilateral;  needs RNFA   TUBAL LIGATION      Medical History: Past Medical History:  Diagnosis Date   Allergy    Anxiety     Asthma 11/24/2019   blood allergy testing done 11/24/19 to see what causes it to flare up   Bipolar 1 disorder (HCC)    Bipolar disorder (HCC)    Chronic cough    being worked up with Dr. Jayme Cloud   Depression    Dyspareunia in female 11/2019   Dysrhythmia    TACHY BUT NOT A PROBLEM   Ectopic pregnancy    had surgery   GERD (gastroesophageal reflux disease)    History of concussion 2016   Hyperlipidemia    Migraine    has an aura. happening frequently   MRSA (methicillin resistant Staphylococcus aureus)    was on chin.  many years ago   NSVD (normal spontaneous vaginal delivery)    x 2   Poor dentition    Pseudoseizures    related to anxiety   Scoliosis    Scoliosis     Family History: Family History  Problem Relation Age of Onset   Asthma Mother    Heart disease Mother    Diabetes Mother    Hyperlipidemia Mother    Hypertension Mother    Mental illness Mother    Migraines Mother    Thyroid disease Mother    Stroke Mother    Alcohol abuse Mother    Bipolar disorder Mother    Anxiety disorder Mother    COPD Mother    Hypertension Sister    Depression Sister    Drug abuse Sister    Cancer Maternal Grandmother    Diabetes Maternal Grandmother    Heart disease Maternal Grandmother    Hyperlipidemia Maternal Grandmother    Hypertension Maternal Grandmother    Kidney disease Maternal Grandmother    Heart disease Paternal Grandfather    Alcohol abuse Paternal Grandfather    Drug abuse Paternal Grandfather     Social History   Socioeconomic History   Marital status: Divorced    Spouse name: Not on file   Number of children: 2   Years of education: Not on file   Highest education level: GED or equivalent  Occupational History   Not on file  Tobacco Use   Smoking status: Every Day    Current packs/day: 0.00    Average packs/day: 1 pack/day for 15.0 years (15.0 ttl pk-yrs)    Types: Cigarettes    Start date: 09/07/2019    Last attempt to quit: 09/23/2019     Years since quitting: 3.7   Smokeless tobacco: Never   Tobacco comments:    did not want to quit but can't inhale w/ coughing  Vaping Use   Vaping status: Never Used  Substance and Sexual Activity   Alcohol use: No    Alcohol/week: 0.0 standard drinks of alcohol   Drug use: No   Sexual activity: Yes    Partners: Male  Birth control/protection: Surgical, None  Other Topics Concern   Not on file  Social History Narrative   Lives with fiance and 2 kids.   Social Drivers of Health   Financial Resource Strain: Medium Risk (11/12/2019)   Overall Financial Resource Strain (CARDIA)    Difficulty of Paying Living Expenses: Somewhat hard  Food Insecurity: Food Insecurity Present (03/29/2023)   Hunger Vital Sign    Worried About Running Out of Food in the Last Year: Sometimes true    Ran Out of Food in the Last Year: Sometimes true  Transportation Needs: No Transportation Needs (03/29/2023)   PRAPARE - Administrator, Civil Service (Medical): No    Lack of Transportation (Non-Medical): No  Physical Activity: Sufficiently Active (11/12/2019)   Exercise Vital Sign    Days of Exercise per Week: 5 days    Minutes of Exercise per Session: 80 min  Stress: Stress Concern Present (11/12/2019)   [employer] of Occupational Health - Occupational Stress Questionnaire    Feeling of Stress : Very much  Social Connections: Moderately Isolated (11/12/2019)   Social Connection and Isolation Panel [NHANES]    Frequency of Communication with Friends and Family: More than three times a week    Frequency of Social Gatherings with Friends and Family: Once a week    Attends Religious Services: Never    Database administrator or Organizations: No    Attends Banker Meetings: Never    Marital Status: Living with partner  Intimate Partner Violence: Not At Risk (03/29/2023)   Humiliation, Afraid, Rape, and Kick questionnaire    Fear of Current or Ex-Partner: No    Emotionally  Abused: No    Physically Abused: No    Sexually Abused: No      Review of Systems  Constitutional:  Positive for appetite change, diaphoresis and fatigue.  Respiratory: Negative.  Negative for cough, chest tightness, shortness of breath and wheezing.   Cardiovascular: Negative.  Negative for chest pain and palpitations.  Gastrointestinal:  Positive for nausea and vomiting. Negative for abdominal pain, constipation and diarrhea.  Musculoskeletal:  Positive for arthralgias (knee pain).  Neurological:  Positive for headaches.    Vital Signs: BP 128/74   Pulse (!) 116   Temp 98.5 F (36.9 C)   Resp 16   Ht 5\' 5"  (1.651 m)   Wt 213 lb 3.2 oz (96.7 kg)   LMP 11/09/2019   SpO2 98%   BMI 35.48 kg/m    Physical Exam Vitals reviewed.  Constitutional:      General: She is not in acute distress.    Appearance: Normal appearance. She is obese. She is not ill-appearing.  HENT:     Head: Normocephalic and atraumatic.  Eyes:     Pupils: Pupils are equal, round, and reactive to light.  Cardiovascular:     Rate and Rhythm: Normal rate and regular rhythm.  Pulmonary:     Effort: Pulmonary effort is normal. No respiratory distress.  Neurological:     Mental Status: She is alert and oriented to person, place, and time.  Psychiatric:        Mood and Affect: Mood normal.        Behavior: Behavior normal.        Assessment/Plan: 1. Moderate persistent asthma without complication (Primary) Start dulera inhaler twice daily. CT chest and PFT ordered, follow up in 6 weeks.  - CT Chest Wo Contrast; Future - Pulmonary function test; Future - mometasone-formoterol (  DULERA) 100-5 MCG/ACT AERO; Inhale 2 puffs into the lungs in the morning and at bedtime.  Dispense: 1 each; Refill: 5  2. SOB (shortness of breath) on exertion CT chest and PFT ordered, follow up in about 6 weeks. Start dulera inhaler as maintenance twice daily.  - CT Chest Wo Contrast; Future - Pulmonary function test;  Future - mometasone-formoterol (DULERA) 100-5 MCG/ACT AERO; Inhale 2 puffs into the lungs in the morning and at bedtime.  Dispense: 1 each; Refill: 5  3. Enlarged lymph node Repeat CT chest ordered  - CT Chest Wo Contrast; Future  4. Nausea and vomiting, unspecified vomiting type Persistent nausea and vomiting despite controlling GERD. Referred to GI for further evaluation  - Ambulatory referral to Gastroenterology  5. Encounter for medication review Medication list reviewed, updated and refills ordered  - ondansetron (ZOFRAN) 4 MG tablet; Take 1 tablet (4 mg total) by mouth every 8 (eight) hours as needed for nausea or vomiting.  Dispense: 20 tablet; Refill: 4 - omeprazole (PRILOSEC) 40 MG capsule; Take 1 capsule (40 mg total) by mouth daily. (Heartburn med)  Dispense: 30 capsule; Refill: 2 - meloxicam (MOBIC) 7.5 MG tablet; Take 1 tablet (7.5 mg total) by mouth daily. For arthritis  Dispense: 30 tablet; Refill: 3 - dicyclomine (BENTYL) 20 MG tablet; Take 1 tablet (20 mg total) by mouth 4 (four) times daily -  before meals and at bedtime. FOR IBS  Dispense: 120 tablet; Refill: 2   General Counseling: Tula verbalizes understanding of the findings of todays visit and agrees with plan of treatment. I have discussed any further diagnostic evaluation that may be needed or ordered today. We also reviewed her medications today. she has been encouraged to call the office with any questions or concerns that should arise related to todays visit.    Orders Placed This Encounter  Procedures   CT Chest Wo Contrast   Ambulatory referral to Gastroenterology   Pulmonary function test    Meds ordered this encounter  Medications   mometasone-formoterol (DULERA) 100-5 MCG/ACT AERO    Sig: Inhale 2 puffs into the lungs in the morning and at bedtime.    Dispense:  1 each    Refill:  5    New med, please fill new script today asap   ondansetron (ZOFRAN) 4 MG tablet    Sig: Take 1 tablet (4 mg  total) by mouth every 8 (eight) hours as needed for nausea or vomiting.    Dispense:  20 tablet    Refill:  4   omeprazole (PRILOSEC) 40 MG capsule    Sig: Take 1 capsule (40 mg total) by mouth daily. (Heartburn med)    Dispense:  30 capsule    Refill:  2   meloxicam (MOBIC) 7.5 MG tablet    Sig: Take 1 tablet (7.5 mg total) by mouth daily. For arthritis    Dispense:  30 tablet    Refill:  3    Fill new script today.   dicyclomine (BENTYL) 20 MG tablet    Sig: Take 1 tablet (20 mg total) by mouth 4 (four) times daily -  before meals and at bedtime. FOR IBS    Dispense:  120 tablet    Refill:  2    Return in about 6 weeks (around 08/21/2023) for F/U, Renato Spellman PCP discuss results of Ct chest and PFT. , eval new med.   Total time spent:30 Minutes Time spent includes review of chart, medications, test results, and follow up  plan with the patient.   [place] Controlled Substance Database was reviewed by me.  This patient was seen by Sallyanne Kuster, FNP-C in collaboration with Dr. Beverely Risen as a part of collaborative care agreement.   Kruti Horacek R. Tedd Sias, MSN, FNP-C Internal medicine

## 2023-07-10 NOTE — Telephone Encounter (Signed)
Notified patient of CT appointment date, arrival time, location-[name] 

## 2023-07-12 ENCOUNTER — Ambulatory Visit
Admission: RE | Admit: 2023-07-12 | Discharge: 2023-07-12 | Disposition: A | Discharge status: Home / Self Care | Payer: MEDICAID | Source: Ambulatory Visit | Attending: Nurse Practitioner

## 2023-07-12 DIAGNOSIS — R599 Enlarged lymph nodes, unspecified: Secondary | ICD-10-CM | POA: Diagnosis present

## 2023-07-12 DIAGNOSIS — R0602 Shortness of breath: Secondary | ICD-10-CM | POA: Diagnosis present

## 2023-07-12 DIAGNOSIS — J4540 Moderate persistent asthma, uncomplicated: Secondary | ICD-10-CM | POA: Insufficient documentation

## 2023-07-14 ENCOUNTER — Other Ambulatory Visit: Payer: Self-pay

## 2023-07-14 ENCOUNTER — Emergency Department
Admission: EM | Admit: 2023-07-14 | Discharge: 2023-07-14 | Disposition: A | Discharge status: Home / Self Care | Payer: MEDICAID | Attending: Emergency Medicine | Admitting: Emergency Medicine

## 2023-07-14 DIAGNOSIS — J45909 Unspecified asthma, uncomplicated: Secondary | ICD-10-CM | POA: Insufficient documentation

## 2023-07-14 DIAGNOSIS — A084 Viral intestinal infection, unspecified: Secondary | ICD-10-CM

## 2023-07-14 DIAGNOSIS — R112 Nausea with vomiting, unspecified: Secondary | ICD-10-CM | POA: Diagnosis present

## 2023-07-14 DIAGNOSIS — Z20822 Contact with and (suspected) exposure to covid-19: Secondary | ICD-10-CM | POA: Insufficient documentation

## 2023-07-14 LAB — RESP PANEL BY RT-PCR (RSV, FLU A&B, COVID)  RVPGX2
Influenza A by PCR: NEGATIVE
Influenza B by PCR: NEGATIVE
Resp Syncytial Virus by PCR: NEGATIVE
SARS Coronavirus 2 by RT PCR: NEGATIVE

## 2023-07-14 LAB — CBC
HCT: 39.9 % (ref 36.0–46.0)
Hemoglobin: 13.3 g/dL (ref 12.0–15.0)
MCH: 29 pg (ref 26.0–34.0)
MCHC: 33.3 g/dL (ref 30.0–36.0)
MCV: 87.1 fL (ref 80.0–100.0)
Platelets: 314 10*3/uL (ref 150–400)
RBC: 4.58 MIL/uL (ref 3.87–5.11)
RDW: 13.8 % (ref 11.5–15.5)
WBC: 9.9 10*3/uL (ref 4.0–10.5)
nRBC: 0 % (ref 0.0–0.2)

## 2023-07-14 LAB — COMPREHENSIVE METABOLIC PANEL
ALT: 20 U/L (ref 0–44)
AST: 23 U/L (ref 15–41)
Albumin: 3.6 g/dL (ref 3.5–5.0)
Alkaline Phosphatase: 70 U/L (ref 38–126)
Anion gap: 11 (ref 5–15)
BUN: 10 mg/dL (ref 6–20)
CO2: 19 mmol/L — ABNORMAL LOW (ref 22–32)
Calcium: 8.3 mg/dL — ABNORMAL LOW (ref 8.9–10.3)
Chloride: 102 mmol/L (ref 98–111)
Creatinine, Ser: 0.76 mg/dL (ref 0.44–1.00)
GFR, Estimated: >60 mL/min (ref >60)
Glucose, Bld: 114 mg/dL — ABNORMAL HIGH (ref 70–99)
Potassium: 3.2 mmol/L — ABNORMAL LOW (ref 3.5–5.1)
Sodium: 132 mmol/L — ABNORMAL LOW (ref 135–145)
Total Bilirubin: 0.6 mg/dL (ref 0.0–1.2)
Total Protein: 6.9 g/dL (ref 6.5–8.1)

## 2023-07-14 LAB — LIPASE, BLOOD: Lipase: 23 U/L (ref 11–51)

## 2023-07-14 MED ORDER — METOCLOPRAMIDE HCL 10 MG PO TABS: 10.0000 mg | ORAL_TABLET | Freq: Three times a day (TID) | ORAL | 0 refills | Status: DC | PRN

## 2023-07-14 MED ORDER — METOCLOPRAMIDE HCL 10 MG PO TABS
10.0000 mg | ORAL_TABLET | Freq: Once | ORAL | Status: AC
  Administered 2023-07-14: 10 mg via ORAL
  Filled 2023-07-14: qty 1

## 2023-07-14 MED ORDER — DIPHENHYDRAMINE HCL 25 MG PO CAPS
25.0000 mg | ORAL_CAPSULE | Freq: Once | ORAL | Status: AC
  Administered 2023-07-14: 25 mg via ORAL
  Filled 2023-07-14: qty 1

## 2023-07-14 MED ORDER — ACETAMINOPHEN 325 MG PO TABS
650.0000 mg | ORAL_TABLET | Freq: Once | ORAL | Status: AC
  Administered 2023-07-14: 650 mg via ORAL
  Filled 2023-07-14: qty 2

## 2023-07-14 NOTE — ED Triage Notes (Signed)
Pt sts that she has been having N,V,D with body aches for the last three days.

## 2023-07-14 NOTE — ED Provider Notes (Signed)
[facility] Emergency Department Provider Note     Event Date/Time   First MD Initiated Contact with Patient 07/14/23 2152     (approximate)   History   Nausea   HPI  [name] is a 38 y.o. female with a history of asthma, HLD, bipolar disorder, presents to the ED endorsing nausea, vomiting and diarrhea.  She endorsed 3 days of symptoms with associated intermittent headache and bodyaches.  She notes similar symptoms in her mother and children, and initially and her husband.  She notes her last diarrhea episode was this morning at 10 AM, and she has been able to keep some liquids down in the interim.  No significant benefit with Zofran that she has at home.  Physical Exam   Triage Vital Signs: ED Triage Vitals  Encounter Vitals Group     BP 07/14/23 1905 125/86     Systolic BP Percentile --      Diastolic BP Percentile --      Pulse Rate 07/14/23 1905 (!) 125     Resp 07/14/23 1905 17     Temp 07/14/23 1905 98.1 F (36.7 C)     Temp Source 07/14/23 1905 Oral     SpO2 07/14/23 1905 96 %     Weight 07/14/23 1903 213 lb (96.6 kg)     Height 07/14/23 1903 5\' 5"  (1.651 m)     Head Circumference --      Peak Flow --      Pain Score 07/14/23 1903 5     Pain Loc --      Pain Education --      Exclude from Growth Chart --     Most recent vital signs: Vitals:   07/14/23 1905 07/14/23 2210  BP: 125/86   Pulse: (!) 125 (!) 109  Resp: 17   Temp: 98.1 F (36.7 C) 98 F (36.7 C)  SpO2: 96%     General Awake, no distress. NAD HEENT NCAT. PERRL. EOMI. No rhinorrhea. Mucous membranes are moist.  CV:  Good peripheral perfusion. RRR RESP:  Normal effort. CTA ABD:  No distention.  Soft and nontender.  ED Results / Procedures / Treatments   Labs (all labs ordered are listed, but only abnormal results are displayed) Labs Reviewed  COMPREHENSIVE METABOLIC PANEL - Abnormal; Notable for the following components:      Result Value   Sodium 132  (*)    Potassium 3.2 (*)    CO2 19 (*)    Glucose, Bld 114 (*)    Calcium 8.3 (*)    All other components within normal limits  RESP PANEL BY RT-PCR (RSV, FLU A&B, COVID)  RVPGX2  LIPASE, BLOOD  CBC  URINALYSIS, ROUTINE W REFLEX MICROSCOPIC     EKG   RADIOLOGY  No results found.   PROCEDURES:  Critical Care performed: No  Procedures   MEDICATIONS ORDERED IN ED: Medications  metoCLOPramide (REGLAN) tablet 10 mg (10 mg Oral Given 07/14/23 2247)  acetaminophen (TYLENOL) tablet 650 mg (650 mg Oral Given 07/14/23 2247)  diphenhydrAMINE (BENADRYL) capsule 25 mg (25 mg Oral Given 07/14/23 2247)     IMPRESSION / MDM / ASSESSMENT AND PLAN / ED COURSE  I reviewed the triage vital signs and the nursing notes.                              Differential diagnosis includes, but is not  limited to, ovarian cyst, ovarian torsion, acute appendicitis, diverticulitis, urinary tract infection/pyelonephritis, endometriosis, bowel obstruction, colitis, renal colic, gastroenteritis, hernia, fibroids, endometriosis, pregnancy related pain including ectopic pregnancy, etc.  Patient's presentation is most consistent with acute complicated illness / injury requiring diagnostic workup.  Patient's diagnosis is consistent with viral gastroenteritis.  Patient with otherwise reassuring exam and workup without evidence of acute lab abnormalities.  Viral panel test is negative.  No signs of acute dehydration or electrolyte abnormalities.  Patient will be discharged home with prescriptions for Reglan. Patient is to follow up with her PCP as discussed, as needed or otherwise directed. Patient is given ED precautions to return to the ED for any worsening or new symptoms.  FINAL CLINICAL IMPRESSION(S) / ED DIAGNOSES   Final diagnoses:  Viral gastroenteritis     Rx / DC Orders   ED Discharge Orders          Ordered    metoCLOPramide (REGLAN) 10 MG tablet  Every 8 hours PRN        07/14/23 2249              Note:  This document was prepared using Dragon voice recognition software and may include unintentional dictation errors.    Lissa Hoard, PA-C 07/14/23 2318    Dionne Bucy, MD 07/17/23 [phone]

## 2023-07-14 NOTE — Discharge Instructions (Addendum)
Your exam, labs, and viral panel test are overall normal and negative.  No signs of COVID, flu, or RSV.  Your symptoms likely represent a viral gastroenteritis that has been spreading to the family.  Take the nausea medicine as directed.  Hydrate to help prevent dehydration.  Consider OTC Imodium for any ongoing diarrhea.  Follow-up with your primary provider or return to the ED if needed.

## 2023-07-24 MED ORDER — FLUTICASONE-SALMETEROL 115-21 MCG/ACT IN AERO: 2.0000 | INHALATION_SPRAY | Freq: Two times a day (BID) | RESPIRATORY_TRACT | 12 refills | Status: AC

## 2023-07-24 NOTE — Telephone Encounter (Signed)
Pt notified that  we change to advair

## 2023-07-31 ENCOUNTER — Encounter: Payer: MEDICAID | Admitting: Internal Medicine

## 2023-08-07 ENCOUNTER — Telehealth: Payer: Self-pay

## 2023-08-07 NOTE — Telephone Encounter (Signed)
LVM to reschedule PFT appt on 08/14/23 due to issue with equipment.

## 2023-08-13 ENCOUNTER — Other Ambulatory Visit: Payer: Self-pay | Admitting: Nurse Practitioner

## 2023-08-13 DIAGNOSIS — Z79899 Other long term (current) drug therapy: Secondary | ICD-10-CM

## 2023-08-14 ENCOUNTER — Encounter: Payer: MEDICAID | Admitting: Internal Medicine

## 2023-08-15 ENCOUNTER — Emergency Department
Admission: EM | Admit: 2023-08-15 | Discharge: 2023-08-15 | Disposition: A | Discharge status: Home / Self Care | Payer: MEDICAID | Attending: Emergency Medicine | Admitting: Emergency Medicine

## 2023-08-15 ENCOUNTER — Emergency Department: Payer: MEDICAID

## 2023-08-15 ENCOUNTER — Other Ambulatory Visit: Payer: Self-pay

## 2023-08-15 DIAGNOSIS — R000 Tachycardia, unspecified: Secondary | ICD-10-CM | POA: Diagnosis not present

## 2023-08-15 DIAGNOSIS — R079 Chest pain, unspecified: Secondary | ICD-10-CM | POA: Diagnosis present

## 2023-08-15 LAB — BASIC METABOLIC PANEL
Anion gap: 14 (ref 5–15)
BUN: 12 mg/dL (ref 6–20)
CO2: 20 mmol/L — ABNORMAL LOW (ref 22–32)
Calcium: 10 mg/dL (ref 8.9–10.3)
Chloride: 103 mmol/L (ref 98–111)
Creatinine, Ser: 0.77 mg/dL (ref 0.44–1.00)
GFR, Estimated: >60 mL/min (ref >60)
Glucose, Bld: 117 mg/dL — ABNORMAL HIGH (ref 70–99)
Potassium: 4.3 mmol/L (ref 3.5–5.1)
Sodium: 137 mmol/L (ref 135–145)

## 2023-08-15 LAB — CBC
HCT: 42.3 % (ref 36.0–46.0)
Hemoglobin: 14.2 g/dL (ref 12.0–15.0)
MCH: 28.9 pg (ref 26.0–34.0)
MCHC: 33.6 g/dL (ref 30.0–36.0)
MCV: 86.2 fL (ref 80.0–100.0)
Platelets: 386 10*3/uL (ref 150–400)
RBC: 4.91 MIL/uL (ref 3.87–5.11)
RDW: 13.5 % (ref 11.5–15.5)
WBC: 14.1 10*3/uL — ABNORMAL HIGH (ref 4.0–10.5)
nRBC: 0 % (ref 0.0–0.2)

## 2023-08-15 LAB — TROPONIN I (HIGH SENSITIVITY)
Troponin I (High Sensitivity): <2 ng/L (ref <18)
Troponin I (High Sensitivity): <2 ng/L (ref <18)

## 2023-08-15 LAB — MAGNESIUM: Magnesium: 1.9 mg/dL (ref 1.7–2.4)

## 2023-08-15 MED ORDER — KETOROLAC TROMETHAMINE 15 MG/ML IJ SOLN
15.0000 mg | Freq: Once | INTRAMUSCULAR | Status: AC
  Administered 2023-08-15: 15 mg via INTRAVENOUS
  Filled 2023-08-15: qty 1

## 2023-08-15 MED ORDER — SODIUM CHLORIDE 0.9 % IV BOLUS
1000.0000 mL | Freq: Once | INTRAVENOUS | Status: AC
  Administered 2023-08-15: 1000 mL via INTRAVENOUS

## 2023-08-15 NOTE — ED Triage Notes (Signed)
 Reports chest pain with HR of 150-160 at home. Fire Dept gave patient aspirin. Patient reports chest pain in the center of her chest with no radiation. Reports nausea and feeling light headed. Denies blood thinners. HR 130 in triage. Take diltiazem for heart rate control.

## 2023-08-15 NOTE — ED Triage Notes (Signed)
 First nurse note: pt to ED [facility] from home for chest pain started an hour ago. Increased pain with palpation. Started effexor this am. ST with EMS. 324 asa PTA.

## 2023-08-15 NOTE — Discharge Instructions (Signed)
You were seen in the Emergency Department today for evaluation of your chest pain. Fortunately, your labs, EKG, and chest x-Deshawnda Acrey were overall reassuring against a emergency cause for your pain. Please follow-up with your primary doctor within the next few days for reevaluation.Return to the ER for any new or worsening symptoms including worsening chest pain, difficulty breathing, or any other new or concerning symptoms that you believe warrants immediate attention.

## 2023-08-15 NOTE — ED Provider Notes (Signed)
 [facility] Provider Note    Event Date/Time   First MD Initiated Contact with Patient 08/15/23 1459     (approximate)   History   Chest Pain   HPI  [name] is a 38 year old female presenting to the ER for evaluation of chest pain. Took Effexor for the first time this morning and had onset of symptoms shortly after around 10am. On initial evaluation here, chest pain is improved but not completely resolved. Has history of sinus tachycardia for which she has as needed diltiazem. Took a dose today which did not initially improve her symptoms leading her to present to the ER.     Physical Exam   Triage Vital Signs: ED Triage Vitals  Encounter Vitals Group     BP 08/15/23 1231 122/62     Systolic BP Percentile --      Diastolic BP Percentile --      Pulse Rate 08/15/23 1231 (!) 130     Resp 08/15/23 1231 15     Temp 08/15/23 1231 98.5 F (36.9 C)     Temp Source 08/15/23 1231 Oral     SpO2 08/15/23 1231 96 %     Weight 08/15/23 1232 213 lb (96.6 kg)     Height 08/15/23 1232 5' (1.524 m)     Head Circumference --      Peak Flow --      Pain Score 08/15/23 1232 6     Pain Loc --      Pain Education --      Exclude from Growth Chart --     Most recent vital signs: Vitals:   08/15/23 1231 08/15/23 1559  BP: 122/62 (!) 117/47  Pulse: (!) 130 96  Resp: 15 16  Temp: 98.5 F (36.9 C) 98.3 F (36.8 C)  SpO2: 96% 100%     General: Awake, interactive  CV:  Mild tachycardia at time of my initial evaluation, good peripheral perfusion Resp:  Unlabored respirations, lungs clear to auscultation Abd:  Nondistended, soft, nontender to palpation Neuro:  Symmetric facial movement, fluid speech   ED Results / Procedures / Treatments   Labs (all labs ordered are listed, but only abnormal results are displayed) Labs Reviewed  BASIC METABOLIC PANEL - Abnormal; Notable for the following components:      Result Value   CO2 20 (*)    Glucose,  Bld 117 (*)    All other components within normal limits  CBC - Abnormal; Notable for the following components:   WBC 14.1 (*)    All other components within normal limits  MAGNESIUM  TROPONIN I (HIGH SENSITIVITY)  TROPONIN I (HIGH SENSITIVITY)     EKG EKG independently reviewed interpreted by myself (ER attending) demonstrates:  EKG demonstrate sinus tachycardia at a rate of 113, PR 134, QRS 90, QTc 403, no acute ST changes  RADIOLOGY Imaging independently reviewed and interpreted by myself demonstrates:  CXR without focal consolidation  PROCEDURES:  Critical Care performed: No  Procedures   MEDICATIONS ORDERED IN ED: Medications  sodium chloride 0.9 % bolus 1,000 mL (1,000 mLs Intravenous New Bag/Given 08/15/23 1602)  ketorolac (TORADOL) 15 MG/ML injection 15 mg (15 mg Intravenous Given 08/15/23 1602)     IMPRESSION / MDM / ASSESSMENT AND PLAN / ED COURSE  I reviewed the triage vital signs and the nursing notes.  Differential diagnosis includes, but is not limited to, anemia, electrolyte abnormality, dehydration, pneumonia, pneumothorax  Patient's presentation is most consistent  with acute presentation with potential threat to life or bodily function.  38 year old female presenting with chest pain and tachycardia.  Heart rate improving at the time of my initial evaluation.  Labs from triage overall reassuring.  Leukocytosis noted, but patient clinically without evidence of infection.  Troponin x 2 negative.  EKG without acute ischemic findings.  X-Latorya Bautch without acute findings.  Patient treated symptomatically with IV fluids and Toradol.  On reevaluation, heart rate improved to the 90s.  Patient feels improved.  With reassuring workup and prior extensive outpatient workup, do think patient is stable for discharge with outpatient follow-up.  She is comfortable this plan.  Strict return precautions provided.      FINAL CLINICAL IMPRESSION(S) / ED DIAGNOSES   Final diagnoses:   Nonspecific chest pain  Sinus tachycardia     Rx / DC Orders   ED Discharge Orders     None        Note:  This document was prepared using Dragon voice recognition software and may include unintentional dictation errors.   Trinna Post, MD 08/15/23 [phone]

## 2023-08-22 ENCOUNTER — Ambulatory Visit: Payer: MEDICAID | Admitting: Nurse Practitioner

## 2023-08-28 ENCOUNTER — Ambulatory Visit: Payer: MEDICAID | Admitting: Nurse Practitioner

## 2023-08-28 ENCOUNTER — Encounter: Payer: Self-pay | Admitting: Nurse Practitioner

## 2023-08-28 VITALS — BP 120/88 | HR 110 | Temp 98.6°F | Resp 16 | Ht 65.0 in | Wt 219.2 lb

## 2023-08-28 DIAGNOSIS — F5105 Insomnia due to other mental disorder: Secondary | ICD-10-CM

## 2023-08-28 DIAGNOSIS — M654 Radial styloid tenosynovitis [de Quervain]: Secondary | ICD-10-CM

## 2023-08-28 DIAGNOSIS — F411 Generalized anxiety disorder: Secondary | ICD-10-CM

## 2023-08-28 DIAGNOSIS — R599 Enlarged lymph nodes, unspecified: Secondary | ICD-10-CM | POA: Diagnosis not present

## 2023-08-28 DIAGNOSIS — Z79899 Other long term (current) drug therapy: Secondary | ICD-10-CM | POA: Diagnosis not present

## 2023-08-28 MED ORDER — VITAMIN D3 25 MCG (1000 UT) PO CAPS: 1000.0000 [IU] | ORAL_CAPSULE | Freq: Every day | ORAL | Status: DC

## 2023-08-28 MED ORDER — MELOXICAM 15 MG PO TABS: 15.0000 mg | ORAL_TABLET | Freq: Every day | ORAL | 1 refills | Status: DC

## 2023-08-28 NOTE — Progress Notes (Signed)
 [facility] [address] [place], [place] [address]  Internal MEDICINE  Office Visit Note  Patient Name: [name]  [id number]  [id number]  Date of Service: 08/28/2023  Chief Complaint  Patient presents with   Depression   Gastroesophageal Reflux   Hyperlipidemia   Follow-up    HPI Cylee presents for a follow-up visit for arthritis, insomnia, anxiety, bipolar disorder. Seen by psychiatrist and her medications were changed. Started venlafaxine and buspirone.  Insomnia -- her psychiatrist stopped her sleep medications, having trouble sleeping now. Has follow up appt with psychiatrist next week.  Joint pains -- has been taking meloxicam 7.5 mg daily which has not been helping very much   Current Medication: Outpatient Encounter Medications as of 08/28/2023  Medication Sig   albuterol (VENTOLIN HFA) 108 (90 Base) MCG/ACT inhaler Inhale 2 puffs into the lungs every 4 (four) hours as needed for wheezing or shortness of breath.   Cholecalciferol (VITAMIN D3) 25 MCG (1000 UT) CAPS Take 1 capsule (1,000 Units total) by mouth daily.   dicyclomine (BENTYL) 20 MG tablet Take 1 tablet (20 mg total) by mouth 4 (four) times daily -  before meals and at bedtime. FOR IBS   diltiazem (CARDIZEM) 30 MG tablet Take by mouth.   folic acid (FOLVITE) 1 MG tablet Take 1 tablet (1 mg total) by mouth daily.   meloxicam (MOBIC) 15 MG tablet Take 1 tablet (15 mg total) by mouth daily.   nortriptyline (PAMELOR) 10 MG capsule Take by mouth.   omeprazole (PRILOSEC) 40 MG capsule TAKE 1 CAPSULE BY MOUTH ONCE DAILY FOR  HEARTBURN   ondansetron (ZOFRAN) 4 MG tablet Take 1 tablet (4 mg total) by mouth every 8 (eight) hours as needed for nausea or vomiting.   venlafaxine XR (EFFEXOR-XR) 37.5 MG 24 hr capsule Take 37.5 mg by mouth daily.   [DISCONTINUED] meloxicam (MOBIC) 7.5 MG tablet Take 1 tablet (7.5 mg total) by mouth daily. For arthritis   busPIRone (BUSPAR) 15 MG tablet Take 15 mg by mouth  2 (two) times daily.   ferrous sulfate 325 (65 FE) MG EC tablet Take by mouth.   fluticasone-salmeterol (ADVAIR HFA) 115-21 MCG/ACT inhaler Inhale 2 puffs into the lungs 2 (two) times daily.   ibuprofen (ADVIL) 600 MG tablet Take 1 tablet (600 mg total) by mouth every 6 (six) hours as needed.   metoCLOPramide (REGLAN) 10 MG tablet Take 1 tablet (10 mg total) by mouth every 8 (eight) hours as needed for up to 5 days for nausea.   [DISCONTINUED] ARIPiprazole (ABILIFY) 10 MG tablet Take 10 mg by mouth daily.   [DISCONTINUED] famotidine (PEPCID) 20 MG tablet Take by mouth.   [DISCONTINUED] FLUoxetine (PROZAC) 40 MG capsule TAKE ONE CAPSULE BY MOUTH EVERY DAY   [DISCONTINUED] gabapentin (NEURONTIN) 100 MG capsule Take 1 capsule by mouth 3 (three) times daily as needed.   [DISCONTINUED] hydrOXYzine (ATARAX) 25 MG tablet Take 1 tablet by mouth daily.   [DISCONTINUED] mometasone-formoterol (DULERA) 100-5 MCG/ACT AERO Inhale 2 puffs into the lungs in the morning and at bedtime.   [DISCONTINUED] ondansetron (ZOFRAN-ODT) 4 MG disintegrating tablet DISSOLVE 1 TABLET IN MOUTH EVERY 6 HOURS AS NEEDED FOR NAUSEA FOR VOMITING   [DISCONTINUED] Oxcarbazepine (TRILEPTAL) 300 MG tablet Take 300 mg by mouth 2 (two) times daily.   [DISCONTINUED] topiramate (TOPAMAX) 25 MG tablet Take 25 mg by mouth 2 (two) times daily.   [DISCONTINUED] traZODone (DESYREL) 100 MG tablet TAKE ONE TABLET BY MOUTH AT BEDTIME AS  NEEDED FOR SLEEP   No facility-administered encounter medications on file as of 08/28/2023.    Surgical History: Past Surgical History:  Procedure Laterality Date   ABDOMINAL HYSTERECTOMY     CARPAL TUNNEL RELEASE  2015   left   CYSTOSCOPY  11/27/2019   Procedure: CYSTOSCOPY;  Surgeon: Nadara Mustard, MD;  Location: [facility] ORS;  Service: Gynecology;;   ECTOPIC PREGNANCY SURGERY  2011   HERNIA REPAIR  2010   TOTAL LAPAROSCOPIC HYSTERECTOMY WITH SALPINGECTOMY Bilateral 11/27/2019   Procedure: TOTAL  LAPAROSCOPIC HYSTERECTOMY WITH SALPINGECTOMY;  Surgeon: Nadara Mustard, MD;  Location: [facility] ORS;  Service: Gynecology;  Laterality: Bilateral;  needs RNFA   TUBAL LIGATION      Medical History: Past Medical History:  Diagnosis Date   Allergy    Anxiety    Asthma 11/24/2019   blood allergy testing done 11/24/19 to see what causes it to flare up   Bipolar 1 disorder (HCC)    Bipolar disorder (HCC)    Chronic cough    being worked up with Dr. Jayme Cloud   Depression    Dyspareunia in female 11/2019   Dysrhythmia    TACHY BUT NOT A PROBLEM   Ectopic pregnancy    had surgery   GERD (gastroesophageal reflux disease)    History of concussion 2016   Hyperlipidemia    Migraine    has an aura. happening frequently   MRSA (methicillin resistant Staphylococcus aureus)    was on chin.  many years ago   NSVD (normal spontaneous vaginal delivery)    x 2   Poor dentition    Pseudoseizures    related to anxiety   Scoliosis    Scoliosis     Family History: Family History  Problem Relation Age of Onset   Asthma Mother    Heart disease Mother    Diabetes Mother    Hyperlipidemia Mother    Hypertension Mother    Mental illness Mother    Migraines Mother    Thyroid disease Mother    Stroke Mother    Alcohol abuse Mother    Bipolar disorder Mother    Anxiety disorder Mother    COPD Mother    Hypertension Sister    Depression Sister    Drug abuse Sister    Cancer Maternal Grandmother    Diabetes Maternal Grandmother    Heart disease Maternal Grandmother    Hyperlipidemia Maternal Grandmother    Hypertension Maternal Grandmother    Kidney disease Maternal Grandmother    Heart disease Paternal Grandfather    Alcohol abuse Paternal Grandfather    Drug abuse Paternal Grandfather     Social History   Socioeconomic History   Marital status: Divorced    Spouse name: Not on file   Number of children: 2   Years of education: Not on file   Highest education level: GED or  equivalent  Occupational History   Not on file  Tobacco Use   Smoking status: Every Day    Current packs/day: 0.00    Average packs/day: 1 pack/day for 15.0 years (15.0 ttl pk-yrs)    Types: Cigarettes    Start date: 09/07/2019    Last attempt to quit: 09/23/2019    Years since quitting: 3.9   Smokeless tobacco: Never   Tobacco comments:    did not want to quit but can't inhale w/ coughing  Vaping Use   Vaping status: Never Used  Substance and Sexual Activity   Alcohol use: No  Alcohol/week: 0.0 standard drinks of alcohol   Drug use: No   Sexual activity: Yes    Partners: Male    Birth control/protection: Surgical, None  Other Topics Concern   Not on file  Social History Narrative   Lives with fiance and 2 kids.   Social Drivers of Health   Financial Resource Strain: Medium Risk (11/12/2019)   Overall Financial Resource Strain (CARDIA)    Difficulty of Paying Living Expenses: Somewhat hard  Food Insecurity: Food Insecurity Present (03/29/2023)   Hunger Vital Sign    Worried About Running Out of Food in the Last Year: Sometimes true    Ran Out of Food in the Last Year: Sometimes true  Transportation Needs: No Transportation Needs (03/29/2023)   PRAPARE - Administrator, Civil Service (Medical): No    Lack of Transportation (Non-Medical): No  Physical Activity: Sufficiently Active (11/12/2019)   Exercise Vital Sign    Days of Exercise per Week: 5 days    Minutes of Exercise per Session: 80 min  Stress: Stress Concern Present (11/12/2019)   [employer] of Occupational Health - Occupational Stress Questionnaire    Feeling of Stress : Very much  Social Connections: Moderately Isolated (11/12/2019)   Social Connection and Isolation Panel [NHANES]    Frequency of Communication with Friends and Family: More than three times a week    Frequency of Social Gatherings with Friends and Family: Once a week    Attends Religious Services: Never    Doctor, general practice or Organizations: No    Attends Banker Meetings: Never    Marital Status: Living with partner  Intimate Partner Violence: Not At Risk (03/29/2023)   Humiliation, Afraid, Rape, and Kick questionnaire    Fear of Current or Ex-Partner: No    Emotionally Abused: No    Physically Abused: No    Sexually Abused: No      Review of Systems  Constitutional:  Positive for appetite change, diaphoresis and fatigue.  Respiratory: Negative.  Negative for cough, chest tightness, shortness of breath and wheezing.   Cardiovascular: Negative.  Negative for chest pain and palpitations.  Gastrointestinal:  Positive for nausea. Negative for abdominal pain, constipation, diarrhea and vomiting.  Musculoskeletal:  Positive for arthralgias (knee pain).  Neurological:  Positive for headaches.    Vital Signs: BP 120/88   Pulse (!) 110   Temp 98.6 F (37 C)   Resp 16   Ht 5\' 5"  (1.651 m)   Wt 219 lb 3.2 oz (99.4 kg)   LMP 11/09/2019   SpO2 97%   BMI 36.48 kg/m    Physical Exam Vitals reviewed.  Constitutional:      General: She is not in acute distress.    Appearance: Normal appearance. She is obese. She is not ill-appearing.  HENT:     Head: Normocephalic and atraumatic.  Eyes:     Pupils: Pupils are equal, round, and reactive to light.  Cardiovascular:     Rate and Rhythm: Normal rate and regular rhythm.  Pulmonary:     Effort: Pulmonary effort is normal. No respiratory distress.  Neurological:     Mental Status: She is alert and oriented to person, place, and time.  Psychiatric:        Mood and Affect: Mood normal.        Behavior: Behavior normal.        Assessment/Plan: 1. Radial styloid tenosynovitis (Primary) Continue meloxicam as prescribed.  - meloxicam (MOBIC)  15 MG tablet; Take 1 tablet (15 mg total) by mouth daily.  Dispense: 90 tablet; Refill: 1  2. Enlarged lymph node Stable prevascular lymph node 1.9 cm, indeterminate. No change from 2024. No  further interventions at this time.  3. Encounter for medication review Taking iron and vitamin D supplement. Medication list updated.  - ferrous sulfate 325 (65 FE) MG EC tablet; Take by mouth. - Cholecalciferol (VITAMIN D3) 25 MCG (1000 UT) CAPS; Take 1 capsule (1,000 Units total) by mouth daily.  4. Generalized anxiety disorder Sees psychiatry, they have started buspirone and venlafaxine for the patient.  - busPIRone (BUSPAR) 15 MG tablet; Take 15 mg by mouth 2 (two) times daily. - venlafaxine XR (EFFEXOR-XR) 37.5 MG 24 hr capsule; Take 37.5 mg by mouth daily.  5. Insomnia due to mental condition Plans to discuss medication with psychiatrist.   General Counseling: Lynnae Sandhoff understanding of the findings of todays visit and agrees with plan of treatment. I have discussed any further diagnostic evaluation that may be needed or ordered today. We also reviewed her medications today. she has been encouraged to call the office with any questions or concerns that should arise related to todays visit.    No orders of the defined types were placed in this encounter.   Meds ordered this encounter  Medications   Cholecalciferol (VITAMIN D3) 25 MCG (1000 UT) CAPS    Sig: Take 1 capsule (1,000 Units total) by mouth daily.   meloxicam (MOBIC) 15 MG tablet    Sig: Take 1 tablet (15 mg total) by mouth daily.    Dispense:  90 tablet    Refill:  1    Fill for 90 days    Return in about 3 months (around 11/28/2023) for F/U, Ehab Humber PCP.   Total time spent:30 Minutes Time spent includes review of chart, medications, test results, and follow up plan with the patient.   [place] Controlled Substance Database was reviewed by me.  This patient was seen by Sallyanne Kuster, FNP-C in collaboration with Dr. Beverely Risen as a part of collaborative care agreement.   Bryella Diviney R. Tedd Sias, MSN, FNP-C Internal medicine

## 2023-09-14 ENCOUNTER — Encounter: Payer: Self-pay | Admitting: Nurse Practitioner

## 2023-09-18 ENCOUNTER — Ambulatory Visit: Payer: MEDICAID | Admitting: Nurse Practitioner

## 2023-09-18 ENCOUNTER — Encounter: Payer: Self-pay | Admitting: Nurse Practitioner

## 2023-09-18 VITALS — BP 120/86 | HR 98 | Temp 98.5°F | Resp 16 | Ht 65.0 in | Wt 225.6 lb

## 2023-09-18 DIAGNOSIS — L853 Xerosis cutis: Secondary | ICD-10-CM | POA: Diagnosis not present

## 2023-09-18 DIAGNOSIS — M5441 Lumbago with sciatica, right side: Secondary | ICD-10-CM

## 2023-09-18 MED ORDER — DESONIDE 0.05 % EX OINT
TOPICAL_OINTMENT | CUTANEOUS | 3 refills | Status: AC
Start: 2023-09-18 — End: ?

## 2023-09-18 MED ORDER — CYCLOBENZAPRINE HCL 10 MG PO TABS: 10.0000 mg | ORAL_TABLET | Freq: Three times a day (TID) | ORAL | 0 refills | Status: DC | PRN

## 2023-09-18 MED ORDER — PREDNISONE 10 MG (21) PO TBPK
ORAL_TABLET | ORAL | 0 refills | Status: DC
Start: 2023-09-18 — End: 2023-10-09

## 2023-09-18 NOTE — Progress Notes (Signed)
 [facility] [address] [place], [place] [address]  Internal MEDICINE  Office Visit Note  Patient Name: [name]  [id number]  [id number]  Date of Service: 09/18/2023  Chief Complaint  Patient presents with   Acute Visit    Lower back pain, feels better after BM. Feels better when laying down.       HPI Babe presents for an acute sick visit for low back pain --onset of symptoms was 3 days ago --reports low back pain, that radiates down the right leg to the foot/toes. Increased pain with bending over or bending to the side, pain worsens over time throughout the day. Pain in back at night is impacting her sleep negatively.  --has tried tylenol which has not helped. Heating pad did not help. Has not tried ice.        Current Medication:  Outpatient Encounter Medications as of 09/18/2023  Medication Sig   albuterol (VENTOLIN HFA) 108 (90 Base) MCG/ACT inhaler Inhale 2 puffs into the lungs every 4 (four) hours as needed for wheezing or shortness of breath.   busPIRone (BUSPAR) 15 MG tablet Take 15 mg by mouth 2 (two) times daily.   Cholecalciferol (VITAMIN D3) 25 MCG (1000 UT) CAPS Take 1 capsule (1,000 Units total) by mouth daily.   cyclobenzaprine (FLEXERIL) 10 MG tablet Take 1 tablet (10 mg total) by mouth 3 (three) times daily as needed for muscle spasms.   desonide (DESOWEN) 0.05 % ointment Apply 1 gram to the skin on both elbows daily as needed for dry skin/rash   dicyclomine (BENTYL) 20 MG tablet Take 1 tablet (20 mg total) by mouth 4 (four) times daily -  before meals and at bedtime. FOR IBS   diltiazem (CARDIZEM) 30 MG tablet Take by mouth.   ferrous sulfate 325 (65 FE) MG EC tablet Take by mouth.   fluticasone-salmeterol (ADVAIR HFA) 115-21 MCG/ACT inhaler Inhale 2 puffs into the lungs 2 (two) times daily.   folic acid (FOLVITE) 1 MG tablet Take 1 tablet (1 mg total) by mouth daily.   ibuprofen (ADVIL) 600 MG tablet Take 1 tablet (600 mg total) by  mouth every 6 (six) hours as needed.   meloxicam (MOBIC) 15 MG tablet Take 1 tablet (15 mg total) by mouth daily.   nortriptyline (PAMELOR) 10 MG capsule Take by mouth.   omeprazole (PRILOSEC) 40 MG capsule TAKE 1 CAPSULE BY MOUTH ONCE DAILY FOR  HEARTBURN   ondansetron (ZOFRAN) 4 MG tablet Take 1 tablet (4 mg total) by mouth every 8 (eight) hours as needed for nausea or vomiting.   predniSONE (STERAPRED UNI-PAK 21 TAB) 10 MG (21) TBPK tablet Use as directed for 6 days   venlafaxine XR (EFFEXOR-XR) 37.5 MG 24 hr capsule Take 37.5 mg by mouth daily.   metoCLOPramide (REGLAN) 10 MG tablet Take 1 tablet (10 mg total) by mouth every 8 (eight) hours as needed for up to 5 days for nausea.   No facility-administered encounter medications on file as of 09/18/2023.      Medical History: Past Medical History:  Diagnosis Date   Allergy    Anxiety    Asthma 11/24/2019   blood allergy testing done 11/24/19 to see what causes it to flare up   Bipolar 1 disorder (HCC)    Bipolar disorder (HCC)    Chronic cough    being worked up with Dr. Jayme Cloud   Depression    Dyspareunia in female 11/2019   Dysrhythmia    TACHY  BUT NOT A PROBLEM   Ectopic pregnancy    had surgery   GERD (gastroesophageal reflux disease)    History of concussion 2016   Hyperlipidemia    Migraine    has an aura. happening frequently   MRSA (methicillin resistant Staphylococcus aureus)    was on chin.  many years ago   NSVD (normal spontaneous vaginal delivery)    x 2   Poor dentition    Pseudoseizures    related to anxiety   Scoliosis    Scoliosis      Vital Signs: BP 120/86   Pulse (!) 110   Temp 98.5 F (36.9 C)   Resp 16   Ht 5\' 5"  (1.651 m)   Wt 225 lb 9.6 oz (102.3 kg)   LMP 11/09/2019   SpO2 97%   BMI 37.54 kg/m    Review of Systems  Constitutional: Negative.  Negative for fatigue.  Respiratory: Negative.  Negative for cough, chest tightness, shortness of breath and wheezing.   Cardiovascular:  Negative.  Negative for chest pain and palpitations.  Gastrointestinal: Negative.   Genitourinary: Negative.   Musculoskeletal:  Positive for arthralgias, back pain and myalgias.    Physical Exam Vitals reviewed.  Constitutional:      General: She is not in acute distress.    Appearance: Normal appearance. She is obese. She is not ill-appearing.  HENT:     Head: Normocephalic and atraumatic.  Eyes:     Pupils: Pupils are equal, round, and reactive to light.  Cardiovascular:     Rate and Rhythm: Normal rate and regular rhythm.  Pulmonary:     Effort: Pulmonary effort is normal. No respiratory distress.  Musculoskeletal:     Lumbar back: Spasms and tenderness present. Decreased range of motion. Positive right straight leg raise test.  Neurological:     Mental Status: She is alert and oriented to person, place, and time.  Psychiatric:        Mood and Affect: Mood normal.        Behavior: Behavior normal.       Assessment/Plan: 1. Acute right-sided low back pain with right-sided sciatica (Primary) Xray of the lumbar spine ordered. Continue meloxicam as prescribed. Take prednisone taper as prescribed until gone and take cyclobenzaprine as needed as prescribed.  - DG Lumbar Spine Complete; Future - predniSONE (STERAPRED UNI-PAK 21 TAB) 10 MG (21) TBPK tablet; Use as directed for 6 days  Dispense: 21 tablet; Refill: 0 - cyclobenzaprine (FLEXERIL) 10 MG tablet; Take 1 tablet (10 mg total) by mouth 3 (three) times daily as needed for muscle spasms.  Dispense: 60 tablet; Refill: 0  2. Xerosis of skin Topical prescribed, use as ordered. - desonide (DESOWEN) 0.05 % ointment; Apply 1 gram to the skin on both elbows daily as needed for dry skin/rash  Dispense: 60 g; Refill: 3   General Counseling: Lajune verbalizes understanding of the findings of todays visit and agrees with plan of treatment. I have discussed any further diagnostic evaluation that may be needed or ordered today. We  also reviewed her medications today. she has been encouraged to call the office with any questions or concerns that should arise related to todays visit.    Counseling:    Orders Placed This Encounter  Procedures   DG Lumbar Spine Complete    Meds ordered this encounter  Medications   predniSONE (STERAPRED UNI-PAK 21 TAB) 10 MG (21) TBPK tablet    Sig: Use as directed for 6 days  Dispense:  21 tablet    Refill:  0   cyclobenzaprine (FLEXERIL) 10 MG tablet    Sig: Take 1 tablet (10 mg total) by mouth 3 (three) times daily as needed for muscle spasms.    Dispense:  60 tablet    Refill:  0    Fill new script today   desonide (DESOWEN) 0.05 % ointment    Sig: Apply 1 gram to the skin on both elbows daily as needed for dry skin/rash    Dispense:  60 g    Refill:  3    Fill new script today    Return if symptoms worsen or fail to improve.  [place] Controlled Substance Database was reviewed by me for overdose risk score (ORS)  Time spent:20 Minutes Time spent with patient included reviewing progress notes, labs, imaging studies, and discussing plan for follow up.   This patient was seen by Sallyanne Kuster, FNP-C in collaboration with Dr. Beverely Risen as a part of collaborative care agreement.  Rubel Heckard R. Tedd Sias, MSN, FNP-C Internal Medicine

## 2023-09-25 ENCOUNTER — Encounter: Payer: MEDICAID | Admitting: Internal Medicine

## 2023-10-01 ENCOUNTER — Telehealth: Payer: Self-pay

## 2023-10-01 ENCOUNTER — Ambulatory Visit: Payer: MEDICAID | Admitting: Gastroenterology

## 2023-10-01 ENCOUNTER — Emergency Department
Admission: EM | Admit: 2023-10-01 | Discharge: 2023-10-01 | Discharge status: Against Medical Advice | Payer: MEDICAID | Attending: Emergency Medicine | Admitting: Emergency Medicine

## 2023-10-01 ENCOUNTER — Other Ambulatory Visit: Payer: Self-pay

## 2023-10-01 DIAGNOSIS — R5383 Other fatigue: Secondary | ICD-10-CM | POA: Insufficient documentation

## 2023-10-01 DIAGNOSIS — R5381 Other malaise: Secondary | ICD-10-CM | POA: Insufficient documentation

## 2023-10-01 DIAGNOSIS — M791 Myalgia, unspecified site: Secondary | ICD-10-CM | POA: Diagnosis not present

## 2023-10-01 DIAGNOSIS — Z5321 Procedure and treatment not carried out due to patient leaving prior to being seen by health care provider: Secondary | ICD-10-CM | POA: Diagnosis not present

## 2023-10-01 NOTE — ED Triage Notes (Signed)
 Pt reports fatigue body aches and granulized malaise since last Friday.

## 2023-10-01 NOTE — Telephone Encounter (Signed)
 This is Shana from the [facility] office. I am writing to inform you that, unfortunately, we need to cancel your appointment scheduled for next Tuesday. The time slot you were assigned was taken by another colleague while I was in the process of scheduling, and we are unable to double-book open appointments.  Please contact us at your earliest convenience so that we can reschedule your appointment at a time that works best for you.  Thank you for your understanding, and we apologize for any inconvenience this may cause.

## 2023-10-02 ENCOUNTER — Encounter: Payer: MEDICAID | Admitting: Internal Medicine

## 2023-10-02 NOTE — Telephone Encounter (Signed)
 The patient called back to confirm that she received my voicemail about cancellation for her appointment.

## 2023-10-05 ENCOUNTER — Emergency Department
Admission: EM | Admit: 2023-10-05 | Discharge: 2023-10-05 | Disposition: A | Discharge status: Home / Self Care | Payer: MEDICAID | Attending: Emergency Medicine | Admitting: Emergency Medicine

## 2023-10-05 ENCOUNTER — Other Ambulatory Visit: Payer: Self-pay

## 2023-10-05 ENCOUNTER — Emergency Department: Payer: MEDICAID

## 2023-10-05 ENCOUNTER — Encounter: Payer: Self-pay | Admitting: Intensive Care

## 2023-10-05 DIAGNOSIS — R1012 Left upper quadrant pain: Secondary | ICD-10-CM | POA: Insufficient documentation

## 2023-10-05 DIAGNOSIS — R079 Chest pain, unspecified: Secondary | ICD-10-CM | POA: Insufficient documentation

## 2023-10-05 DIAGNOSIS — R000 Tachycardia, unspecified: Secondary | ICD-10-CM | POA: Insufficient documentation

## 2023-10-05 HISTORY — DX: Bronchitis, not specified as acute or chronic: J40

## 2023-10-05 LAB — CBC
HCT: 40.9 % (ref 36.0–46.0)
Hemoglobin: 13.9 g/dL (ref 12.0–15.0)
MCH: 29.3 pg (ref 26.0–34.0)
MCHC: 34 g/dL (ref 30.0–36.0)
MCV: 86.1 fL (ref 80.0–100.0)
Platelets: 357 10*3/uL (ref 150–400)
RBC: 4.75 MIL/uL (ref 3.87–5.11)
RDW: 13.4 % (ref 11.5–15.5)
WBC: 14.7 10*3/uL — ABNORMAL HIGH (ref 4.0–10.5)
nRBC: 0 % (ref 0.0–0.2)

## 2023-10-05 LAB — TROPONIN I (HIGH SENSITIVITY)
Troponin I (High Sensitivity): <2 ng/L (ref <18)
Troponin I (High Sensitivity): 3 ng/L (ref <18)

## 2023-10-05 LAB — BASIC METABOLIC PANEL WITH GFR
Anion gap: 10 (ref 5–15)
BUN: 9 mg/dL (ref 6–20)
CO2: 22 mmol/L (ref 22–32)
Calcium: 9.2 mg/dL (ref 8.9–10.3)
Chloride: 104 mmol/L (ref 98–111)
Creatinine, Ser: 0.74 mg/dL (ref 0.44–1.00)
GFR, Estimated: >60 mL/min (ref >60)
Glucose, Bld: 109 mg/dL — ABNORMAL HIGH (ref 70–99)
Potassium: 3.9 mmol/L (ref 3.5–5.1)
Sodium: 136 mmol/L (ref 135–145)

## 2023-10-05 MED ORDER — LIDOCAINE VISCOUS HCL 2 % MT SOLN
15.0000 mL | Freq: Once | OROMUCOSAL | Status: AC
  Administered 2023-10-05: 15 mL via ORAL
  Filled 2023-10-05: qty 15

## 2023-10-05 MED ORDER — ALUM & MAG HYDROXIDE-SIMETH 200-200-20 MG/5ML PO SUSP
30.0000 mL | Freq: Once | ORAL | Status: AC
  Administered 2023-10-05: 30 mL via ORAL
  Filled 2023-10-05: qty 30

## 2023-10-05 MED ORDER — SODIUM CHLORIDE 0.9 % IV BOLUS
1000.0000 mL | Freq: Once | INTRAVENOUS | Status: AC
  Administered 2023-10-05: 1000 mL via INTRAVENOUS

## 2023-10-05 MED ORDER — KETOROLAC TROMETHAMINE 15 MG/ML IJ SOLN
15.0000 mg | Freq: Once | INTRAMUSCULAR | Status: AC
  Administered 2023-10-05: 15 mg via INTRAVENOUS
  Filled 2023-10-05: qty 1

## 2023-10-05 NOTE — ED Notes (Signed)
 Pt ambulatory to restroom with steady gait.

## 2023-10-05 NOTE — ED Triage Notes (Signed)
 Arrives from home via [facility]> C/O left side pain radiating to shoulder with movement.  Has had flu-like symptoms x 1 week, seen through Urgent Care, negative for flu and COVID.    138/92 130 - P (per EMS this is a normal pulse for patient)

## 2023-10-05 NOTE — ED Notes (Signed)
 Pt verbalizes understanding of discahrge instructions. Encouraged to f/u with PCP

## 2023-10-05 NOTE — ED Provider Notes (Signed)
 [facility] Provider Note    Event Date/Time   First MD Initiated Contact with Patient 10/05/23 1818     (approximate)   History   Abdominal Pain   HPI  [name] is a 38 y.o. female   who presents to the emergency department today because of concerns for left lower chest pain.  Pain started today.  It does come and go.  She describes it as sharp.  Denies any associated shortness of breath.  She has been sick for about a week with flulike symptoms of muscle aches, fatigue.  She denies any fevers.  Went to urgent care and was negative for COVID and flu.  Patient does state that she has baseline tachycardia with heart rates in the 120s to 130 range.      Physical Exam   Triage Vital Signs: ED Triage Vitals  Encounter Vitals Group     BP 10/05/23 1755 (!) 148/79     Systolic BP Percentile --      Diastolic BP Percentile --      Pulse Rate 10/05/23 1755 (!) 144     Resp 10/05/23 1755 18     Temp 10/05/23 1755 98.4 F (36.9 C)     Temp Source 10/05/23 1755 Oral     SpO2 10/05/23 1755 95 %     Weight 10/05/23 1757 225 lb (102.1 kg)     Height 10/05/23 1757 5\' 5"  (1.651 m)     Head Circumference --      Peak Flow --      Pain Score 10/05/23 1756 7     Pain Loc --      Pain Education --      Exclude from Growth Chart --     Most recent vital signs: Vitals:   10/05/23 1755  BP: (!) 148/79  Pulse: (!) 144  Resp: 18  Temp: 98.4 F (36.9 C)  SpO2: 95%   General: Awake, alert, oriented. CV:  Good peripheral perfusion. Tachycardia. Resp:  Normal effort. Lungs clear. Abd:  No distention. Tender to palpation in the left upper quadrant.   ED Results / Procedures / Treatments   Labs (all labs ordered are listed, but only abnormal results are displayed) Labs Reviewed  BASIC METABOLIC PANEL WITH GFR - Abnormal; Notable for the following components:      Result Value   Glucose, Bld 109 (*)    All other components within normal limits   CBC - Abnormal; Notable for the following components:   WBC 14.7 (*)    All other components within normal limits  TROPONIN I (HIGH SENSITIVITY)  TROPONIN I (HIGH SENSITIVITY)     EKG  I, Marylynn Soho, attending physician, personally viewed and interpreted this EKG  EKG Time: 1802 Rate: 137 Rhythm: sinus tachycardia Axis: normal Intervals: qtc 413 QRS: narrow ST changes: no st elevation Impression: abnormal ekg   RADIOLOGY I independently interpreted and visualized the CXR. My interpretation: No pneumonia Radiology interpretation:  IMPRESSION:  No active cardiopulmonary disease.      PROCEDURES:  Critical Care performed: No   MEDICATIONS ORDERED IN ED: Medications - No data to display   IMPRESSION / MDM / ASSESSMENT AND PLAN / ED COURSE  I reviewed the triage vital signs and the nursing notes.                              Differential diagnosis includes,  but is not limited to, gastritis, pneumonia, PE, ACS  Patient's presentation is most consistent with acute presentation with potential threat to life or bodily function.   The patient is on the cardiac monitor to evaluate for evidence of arrhythmia and/or significant heart rate changes.  Patient presented to the emergency department today because of concerns for left lower chest pain.  On exam patient is tender in the left upper abdomen.  Lungs are clear.  EKG without concerning ST elevation or arrhythmia.  Chest x-ray without pneumonia.  Blood work does show a slight leukocytosis.  Per chart review it appears patient frequently has a slight leukocytosis.  At this time I do however have low suspicion for significant bacterial infection.  Will give IV fluids and pain medication.    Patient did not feel significant improvement after Toradol nor GI cocktail.  At that time the patient requested discharge home.  I did discuss obtaining CT with the patient.  At this time is somewhat unclear etiology of the pain  however I do have lower suspicion for significant intra-abdominal infection.  Patient at this time declined CT scan.  I discussed return precautions.  FINAL CLINICAL IMPRESSION(S) / ED DIAGNOSES   Final diagnoses:  Left upper quadrant abdominal pain       Note:  This document was prepared using Dragon voice recognition software and may include unintentional dictation errors.    Marylynn Soho, MD 10/05/23 2222

## 2023-10-05 NOTE — ED Triage Notes (Signed)
 Patient c/o left, upper abdominal pain that radiates into chest. Has some nausea. Reports started today  Says her heart rate is always high and takes metoprolol for it

## 2023-10-08 ENCOUNTER — Encounter: Payer: Self-pay | Admitting: Intensive Care

## 2023-10-08 ENCOUNTER — Ambulatory Visit: Payer: MEDICAID | Admitting: Gastroenterology

## 2023-10-08 ENCOUNTER — Emergency Department
Admission: EM | Admit: 2023-10-08 | Discharge: 2023-10-08 | Disposition: A | Discharge status: Home / Self Care | Payer: MEDICAID | Attending: Emergency Medicine | Admitting: Emergency Medicine

## 2023-10-08 ENCOUNTER — Emergency Department: Payer: MEDICAID

## 2023-10-08 ENCOUNTER — Other Ambulatory Visit: Payer: Self-pay

## 2023-10-08 DIAGNOSIS — R000 Tachycardia, unspecified: Secondary | ICD-10-CM | POA: Diagnosis not present

## 2023-10-08 DIAGNOSIS — J45909 Unspecified asthma, uncomplicated: Secondary | ICD-10-CM | POA: Insufficient documentation

## 2023-10-08 DIAGNOSIS — R079 Chest pain, unspecified: Secondary | ICD-10-CM | POA: Insufficient documentation

## 2023-10-08 DIAGNOSIS — R599 Enlarged lymph nodes, unspecified: Secondary | ICD-10-CM | POA: Insufficient documentation

## 2023-10-08 LAB — CBC
HCT: 39.6 % (ref 36.0–46.0)
Hemoglobin: 13.2 g/dL (ref 12.0–15.0)
MCH: 29.1 pg (ref 26.0–34.0)
MCHC: 33.3 g/dL (ref 30.0–36.0)
MCV: 87.4 fL (ref 80.0–100.0)
Platelets: 377 10*3/uL (ref 150–400)
RBC: 4.53 MIL/uL (ref 3.87–5.11)
RDW: 13.4 % (ref 11.5–15.5)
WBC: 17.8 10*3/uL — ABNORMAL HIGH (ref 4.0–10.5)
nRBC: 0 % (ref 0.0–0.2)

## 2023-10-08 LAB — BASIC METABOLIC PANEL WITH GFR
Anion gap: 12 (ref 5–15)
BUN: 11 mg/dL (ref 6–20)
CO2: 21 mmol/L — ABNORMAL LOW (ref 22–32)
Calcium: 9.1 mg/dL (ref 8.9–10.3)
Chloride: 103 mmol/L (ref 98–111)
Creatinine, Ser: 0.89 mg/dL (ref 0.44–1.00)
GFR, Estimated: >60 mL/min (ref >60)
Glucose, Bld: 113 mg/dL — ABNORMAL HIGH (ref 70–99)
Potassium: 3.5 mmol/L (ref 3.5–5.1)
Sodium: 136 mmol/L (ref 135–145)

## 2023-10-08 LAB — TROPONIN I (HIGH SENSITIVITY)
Troponin I (High Sensitivity): <2 ng/L (ref <18)
Troponin I (High Sensitivity): 2 ng/L (ref <18)

## 2023-10-08 MED ORDER — LACTATED RINGERS IV BOLUS
1000.0000 mL | Freq: Once | INTRAVENOUS | Status: AC
  Administered 2023-10-08: 1000 mL via INTRAVENOUS

## 2023-10-08 MED ORDER — IOHEXOL 350 MG/ML SOLN
100.0000 mL | Freq: Once | INTRAVENOUS | Status: AC | PRN
  Administered 2023-10-08: 100 mL via INTRAVENOUS

## 2023-10-08 MED ORDER — MORPHINE SULFATE (PF) 4 MG/ML IV SOLN
4.0000 mg | Freq: Once | INTRAVENOUS | Status: AC
  Administered 2023-10-08: 4 mg via INTRAVENOUS
  Filled 2023-10-08: qty 1

## 2023-10-08 NOTE — ED Notes (Signed)
 Iv started  meds given.

## 2023-10-08 NOTE — ED Notes (Signed)
 Dr Larinda Buttery in  with pt

## 2023-10-08 NOTE — ED Triage Notes (Signed)
 First Nurse Note: Patient to ED via [facility] from home for seizure like activity x3 lasting approx 3 mins each. Denies hx of same nor takes meds for same. Aox4 between each seizure. Also, c/o CP after being sternal rubbed by EMS. VS WNL  20 R Hand

## 2023-10-08 NOTE — ED Triage Notes (Signed)
 Patient c/o sharp chest pains X1 week. Seen for same 10/05/23.   Patient arrived by [facility] from friends home who reports witnessing X3 seizures.  Patient states her neurologist tells her she has Stress episodes. Reports she takes her prescribed medications.

## 2023-10-08 NOTE — ED Notes (Signed)
Not in room, pt in xray. 

## 2023-10-08 NOTE — ED Notes (Signed)
 Resumed care from jonathan rn.  Pt alert.  Sinus tach on monitor.  Iv in place.

## 2023-10-08 NOTE — ED Provider Notes (Signed)
 [facility] Provider Note    Event Date/Time   First MD Initiated Contact with Patient 10/08/23 1844     (approximate)   History   Chief Complaint Chest Pain   HPI  [name] is a 38 y.o. female with past medical history of bipolar disorder, asthma, migraines, and pseudoseizures who presents to the ED complaining of chest pain.  Patient reports that she has had 1 week of sharp pain in the center of her chest which does not seem to be exacerbated or alleviated by anything.  She denies any associated fevers, cough, or difficulty breathing.  She states that she was seen in the ED for the symptoms 1 week ago with unremarkable workup, however symptoms have worsened since then.  She has not noticed any pain or swelling in her legs, denies any history of DVT/PE.  Patient does report history of "stress-induced" seizures, has had multiple of these today but does not take any medication for seizures.     Physical Exam   Triage Vital Signs: ED Triage Vitals  Encounter Vitals Group     BP 10/08/23 1820 116/76     Systolic BP Percentile --      Diastolic BP Percentile --      Pulse Rate 10/08/23 1820 (!) 132     Resp 10/08/23 1820 18     Temp 10/08/23 1820 98.4 F (36.9 C)     Temp Source 10/08/23 1820 Oral     SpO2 10/08/23 1820 93 %     Weight 10/08/23 1821 225 lb (102.1 kg)     Height 10/08/23 1821 5\' 5"  (1.651 m)     Head Circumference --      Peak Flow --      Pain Score 10/08/23 1821 10     Pain Loc --      Pain Education --      Exclude from Growth Chart --     Most recent vital signs: Vitals:   10/08/23 1930 10/08/23 1945  BP: 124/74   Pulse: (!) 122   Resp: 15   Temp:    SpO2:  97%    Constitutional: Alert and oriented. Eyes: Conjunctivae are normal. Head: Atraumatic. Nose: No congestion/rhinnorhea. Mouth/Throat: Mucous membranes are moist.  Cardiovascular: Tachycardic, regular rhythm. Grossly normal heart sounds.  2+ radial  pulses bilaterally. Respiratory: Normal respiratory effort.  No retractions. Lungs CTAB.  No chest wall tenderness to palpation. Gastrointestinal: Soft and nontender. No distention. Musculoskeletal: No lower extremity tenderness nor edema.  Neurologic:  Normal speech and language. No gross focal neurologic deficits are appreciated.    ED Results / Procedures / Treatments   Labs (all labs ordered are listed, but only abnormal results are displayed) Labs Reviewed  BASIC METABOLIC PANEL WITH GFR - Abnormal; Notable for the following components:      Result Value   CO2 21 (*)    Glucose, Bld 113 (*)    All other components within normal limits  CBC - Abnormal; Notable for the following components:   WBC 17.8 (*)    All other components within normal limits  TROPONIN I (HIGH SENSITIVITY)  TROPONIN I (HIGH SENSITIVITY)     EKG  ED ECG REPORT I, Chesley Noon, the attending physician, personally viewed and interpreted this ECG.   Date: 10/08/2023  EKG Time: 18:30  Rate: 132  Rhythm: sinus tachycardia  Axis: RAD  Intervals:none  ST&T Change: None  RADIOLOGY Chest x-ray reviewed and interpreted  by me with no infiltrate, edema, or effusion.  PROCEDURES:  Critical Care performed: No  Procedures   MEDICATIONS ORDERED IN ED: Medications  lactated ringers bolus 1,000 mL (1,000 mLs Intravenous New Bag/Given 10/08/23 1955)  morphine (PF) 4 MG/ML injection 4 mg (4 mg Intravenous Given 10/08/23 1956)  iohexol (OMNIPAQUE) 350 MG/ML injection 100 mL (100 mLs Intravenous Contrast Given 10/08/23 2007)     IMPRESSION / MDM / ASSESSMENT AND PLAN / ED COURSE  I reviewed the triage vital signs and the nursing notes.                              38 y.o. female with past medical history of bipolar disorder, asthma, migraines, and pseudoseizures who presents to the ED complaining of 1 week of sharp pain in her chest.  Patient's presentation is most consistent with acute presentation  with potential threat to life or bodily function.  Differential diagnosis includes, but is not limited to, ACS, PE, pneumonia, pneumothorax, musculoskeletal pain, GERD, anxiety.  Patient nontoxic-appearing and in no acute distress, vital signs remarkable for tachycardia but otherwise reassuring.  EKG shows sinus tachycardia with no ischemic changes, troponin within normal limits and I have low suspicion for ACS given atypical symptoms.  Additional labs are reassuring with no significant anemia, leukocytosis, electrolyte abnormality, or AKI.  Given tachycardia and ongoing chest pain, will check CTA chest to rule out pulmonary embolism, repeat troponin also pending.  We will treat symptomatically with IV morphine and reassess.  CTA chest is negative for PE or other acute finding, does show a large lymph node that is slightly progressed compared to previous.  She has previously been seen by oncology for this and with gradual increase in size, will refer back for follow-up and to determine whether biopsy needed.  Repeat troponin within normal limits and I doubt cardiac etiology.  She was counseled to follow-up with her PCP in addition to oncology, counseled to return to the ED for new or worsening symptoms.  Patient agrees with plan.      FINAL CLINICAL IMPRESSION(S) / ED DIAGNOSES   Final diagnoses:  Nonspecific chest pain  Enlarged lymph node     Rx / DC Orders   ED Discharge Orders     None        Note:  This document was prepared using Dragon voice recognition software and may include unintentional dictation errors.   Twilla Galea, MD 10/08/23 2119

## 2023-10-09 ENCOUNTER — Encounter: Payer: Self-pay | Admitting: Nurse Practitioner

## 2023-10-09 ENCOUNTER — Ambulatory Visit: Payer: MEDICAID | Admitting: Nurse Practitioner

## 2023-10-09 VITALS — BP 120/78 | HR 93 | Temp 98.0°F | Resp 16 | Ht 65.0 in | Wt 230.8 lb

## 2023-10-09 DIAGNOSIS — D72828 Other elevated white blood cell count: Secondary | ICD-10-CM

## 2023-10-09 DIAGNOSIS — R599 Enlarged lymph nodes, unspecified: Secondary | ICD-10-CM | POA: Diagnosis not present

## 2023-10-09 DIAGNOSIS — R0602 Shortness of breath: Secondary | ICD-10-CM | POA: Diagnosis not present

## 2023-10-09 DIAGNOSIS — M5441 Lumbago with sciatica, right side: Secondary | ICD-10-CM

## 2023-10-09 DIAGNOSIS — R0789 Other chest pain: Secondary | ICD-10-CM | POA: Diagnosis not present

## 2023-10-09 MED ORDER — TRAMADOL HCL 50 MG PO TABS: 50.0000 mg | ORAL_TABLET | Freq: Four times a day (QID) | ORAL | 0 refills | Status: DC | PRN

## 2023-10-09 NOTE — Progress Notes (Signed)
 [facility] [address] [place], [place] [address]  Internal MEDICINE  Office Visit Note  Patient Name: [name]  [id number]  [id number]  Date of Service: 10/09/2023  Chief Complaint  Patient presents with   Depression   Gastroesophageal Reflux   Hyperlipidemia   Follow-up    ED f/u     HPI [name] presents for a follow-up visit for chest pain, high heart rate, and low back pain.  ED visit twice in the past week. These visit were for chest pains.  EKG showed sinus tachycardia with a rate of 137 Denies any palpitations or anxiety related to the chest pains.  Has an appt with cardiology tomorrow  She is also having low back pain and wants to get an xray done.     Current Medication: Outpatient Encounter Medications as of 10/09/2023  Medication Sig   albuterol  (VENTOLIN  HFA) 108 (90 Base) MCG/ACT inhaler Inhale 2 puffs into the lungs every 4 (four) hours as needed for wheezing or shortness of breath.   busPIRone (BUSPAR) 15 MG tablet Take 15 mg by mouth 2 (two) times daily.   Cholecalciferol (VITAMIN D3) 25 MCG (1000 UT) CAPS Take 1 capsule (1,000 Units total) by mouth daily.   cyclobenzaprine  (FLEXERIL ) 10 MG tablet Take 1 tablet (10 mg total) by mouth 3 (three) times daily as needed for muscle spasms.   desonide  (DESOWEN ) 0.05 % ointment Apply 1 gram to the skin on both elbows daily as needed for dry skin/rash   dicyclomine  (BENTYL ) 20 MG tablet Take 1 tablet (20 mg total) by mouth 4 (four) times daily -  before meals and at bedtime. FOR IBS   diltiazem (CARDIZEM) 30 MG tablet Take by mouth.   ferrous sulfate 325 (65 FE) MG EC tablet Take by mouth.   fluticasone -salmeterol (ADVAIR HFA) 115-21 MCG/ACT inhaler Inhale 2 puffs into the lungs 2 (two) times daily.   folic acid  (FOLVITE ) 1 MG tablet Take 1 tablet (1 mg total) by mouth daily.   ibuprofen  (ADVIL ) 600 MG tablet Take 1 tablet (600 mg total) by mouth every 6 (six) hours as needed.   meloxicam  (MOBIC )  15 MG tablet Take 1 tablet (15 mg total) by mouth daily.   nortriptyline (PAMELOR) 10 MG capsule Take by mouth.   omeprazole  (PRILOSEC) 40 MG capsule TAKE 1 CAPSULE BY MOUTH ONCE DAILY FOR  HEARTBURN   ondansetron  (ZOFRAN ) 4 MG tablet Take 1 tablet (4 mg total) by mouth every 8 (eight) hours as needed for nausea or vomiting.   traMADol  (ULTRAM ) 50 MG tablet Take 1 tablet (50 mg total) by mouth every 6 (six) hours as needed for up to 5 days for moderate pain (pain score 4-6) or severe pain (pain score 7-10).   venlafaxine XR (EFFEXOR-XR) 37.5 MG 24 hr capsule Take 37.5 mg by mouth daily.   [DISCONTINUED] predniSONE  (STERAPRED UNI-PAK 21 TAB) 10 MG (21) TBPK tablet Use as directed for 6 days   metoCLOPramide  (REGLAN ) 10 MG tablet Take 1 tablet (10 mg total) by mouth every 8 (eight) hours as needed for up to 5 days for nausea.   No facility-administered encounter medications on file as of 10/09/2023.    Surgical History: Past Surgical History:  Procedure Laterality Date   ABDOMINAL HYSTERECTOMY     CARPAL TUNNEL RELEASE  2015   left   CYSTOSCOPY  11/27/2019   Procedure: CYSTOSCOPY;  Surgeon: Alben Alma, MD;  Location: [facility] ORS;  Service: Gynecology;;   ECTOPIC PREGNANCY  SURGERY  2011   HERNIA REPAIR  2010   TOTAL LAPAROSCOPIC HYSTERECTOMY WITH SALPINGECTOMY Bilateral 11/27/2019   Procedure: TOTAL LAPAROSCOPIC HYSTERECTOMY WITH SALPINGECTOMY;  Surgeon: Alben Alma, MD;  Location: [facility] ORS;  Service: Gynecology;  Laterality: Bilateral;  needs RNFA   TUBAL LIGATION      Medical History: Past Medical History:  Diagnosis Date   Allergy    Anxiety    Asthma 11/24/2019   blood allergy testing done 11/24/19 to see what causes it to flare up   Bipolar 1 disorder (HCC)    Bipolar disorder (HCC)    Bronchitis    Chronic cough    being worked up with Dr. Viva Grise   Depression    Dyspareunia in female 11/2019   Dysrhythmia    TACHY BUT NOT A PROBLEM   Ectopic pregnancy    had  surgery   GERD (gastroesophageal reflux disease)    History of concussion 2016   Hyperlipidemia    Migraine    has an aura. happening frequently   MRSA (methicillin resistant Staphylococcus aureus)    was on chin.  many years ago   NSVD (normal spontaneous vaginal delivery)    x 2   Poor dentition    Pseudoseizures    related to anxiety   Scoliosis    Scoliosis     Family History: Family History  Problem Relation Age of Onset   Asthma Mother    Heart disease Mother    Diabetes Mother    Hyperlipidemia Mother    Hypertension Mother    Mental illness Mother    Migraines Mother    Thyroid disease Mother    Stroke Mother    Alcohol abuse Mother    Bipolar disorder Mother    Anxiety disorder Mother    COPD Mother    Hypertension Sister    Depression Sister    Drug abuse Sister    Cancer Maternal Grandmother    Diabetes Maternal Grandmother    Heart disease Maternal Grandmother    Hyperlipidemia Maternal Grandmother    Hypertension Maternal Grandmother    Kidney disease Maternal Grandmother    Heart disease Paternal Grandfather    Alcohol abuse Paternal Grandfather    Drug abuse Paternal Grandfather     Social History   Socioeconomic History   Marital status: Divorced    Spouse name: Not on file   Number of children: 2   Years of education: Not on file   Highest education level: GED or equivalent  Occupational History   Not on file  Tobacco Use   Smoking status: Every Day    Current packs/day: 0.00    Average packs/day: 1 pack/day for 15.0 years (15.0 ttl pk-yrs)    Types: Cigarettes    Start date: 09/07/2019    Last attempt to quit: 09/23/2019    Years since quitting: 4.0   Smokeless tobacco: Never   Tobacco comments:    did not want to quit but can't inhale w/ coughing  Vaping Use   Vaping status: Never Used  Substance and Sexual Activity   Alcohol use: No    Alcohol/week: 0.0 standard drinks of alcohol   Drug use: No   Sexual activity: Yes     Partners: Male    Birth control/protection: Surgical, None  Other Topics Concern   Not on file  Social History Narrative   Lives with fiance and 2 kids.   Social Drivers of Corporate investment banker Strain: Low  Risk  (10/03/2023)   Received from [facility]   Overall Financial Resource Strain (CARDIA)    Difficulty of Paying Living Expenses: Not very hard  Food Insecurity: Food Insecurity Present (10/03/2023)   Received from [facility]   Hunger Vital Sign    Worried About Running Out of Food in the Last Year: Sometimes true    Ran Out of Food in the Last Year: Sometimes true  Transportation Needs: No Transportation Needs (10/03/2023)   Received from [facility] - Transportation    In the past 12 months, has lack of transportation kept you from medical appointments or from getting medications?: No    Lack of Transportation (Non-Medical): No  Physical Activity: Sufficiently Active (11/12/2019)   Exercise Vital Sign    Days of Exercise per Week: 5 days    Minutes of Exercise per Session: 80 min  Stress: Stress Concern Present (11/12/2019)   [employer] of Occupational Health - Occupational Stress Questionnaire    Feeling of Stress : Very much  Social Connections: Moderately Isolated (11/12/2019)   Social Connection and Isolation Panel [NHANES]    Frequency of Communication with Friends and Family: More than three times a week    Frequency of Social Gatherings with Friends and Family: Once a week    Attends Religious Services: Never    Database administrator or Organizations: No    Attends Banker Meetings: Never    Marital Status: Living with partner  Intimate Partner Violence: Not At Risk (03/29/2023)   Humiliation, Afraid, Rape, and Kick questionnaire    Fear of Current or Ex-Partner: No    Emotionally Abused: No    Physically Abused: No    Sexually Abused: No      Review of Systems   Constitutional: Negative.  Negative for fatigue.  Respiratory: Negative.  Negative for cough, chest tightness, shortness of breath and wheezing.   Cardiovascular:  Positive for chest pain and palpitations.  Gastrointestinal: Negative.   Genitourinary: Negative.   Musculoskeletal:  Positive for arthralgias, back pain and myalgias.    Vital Signs: BP 120/78   Pulse 93   Temp 98 F (36.7 C)   Resp 16   Ht 5\' 5"  (1.651 m)   Wt 230 lb 12.8 oz (104.7 kg)   LMP 11/09/2019   SpO2 96%   BMI 38.41 kg/m    Physical Exam Vitals reviewed.  Constitutional:      General: She is not in acute distress.    Appearance: Normal appearance. She is obese. She is ill-appearing.  HENT:     Head: Normocephalic and atraumatic.  Eyes:     Pupils: Pupils are equal, round, and reactive to light.  Cardiovascular:     Rate and Rhythm: Regular rhythm. Tachycardia present.     Heart sounds: Normal heart sounds.  Pulmonary:     Effort: Pulmonary effort is normal. No respiratory distress.     Breath sounds: Normal breath sounds. No wheezing.  Neurological:     Mental Status: She is alert and oriented to person, place, and time.  Psychiatric:        Mood and Affect: Mood normal.        Behavior: Behavior normal.        Assessment/Plan: 1. Enlarged lymph node (Primary) Has an enlarge prevascular lymph node in the mediastinal cavity. It has increased in size compared to previous imaging and a PET scan is recommended.  -  NM PET Image Initial (PI) Skull Base To Thigh (F-18 FDG); Future  2. Other elevated white blood cell (WBC) count Progressively elevated WBC count as high at >18. PET scan ordered  - NM PET Image Initial (PI) Skull Base To Thigh (F-18 FDG); Future  3. Atypical chest pain EKG was normal except for sinus tachycardia in the ER. She denies any use of substances that could cause an increased heart rate. Tramadol  prescribed as needed   4. SOB (shortness of breath) Related to the  elevated heart rate. Denies any increased anxiety  5. Acute right-sided low back pain with right-sided sciatica Tramadol  prescribed.    General Counseling: [name] verbalizes understanding of the findings of todays visit and agrees with plan of treatment. I have discussed any further diagnostic evaluation that may be needed or ordered today. We also reviewed her medications today. she has been encouraged to call the office with any questions or concerns that should arise related to todays visit.    Orders Placed This Encounter  Procedures   NM PET Image Initial (PI) Skull Base To Thigh (F-18 FDG)    Meds ordered this encounter  Medications   traMADol  (ULTRAM ) 50 MG tablet    Sig: Take 1 tablet (50 mg total) by mouth every 6 (six) hours as needed for up to 5 days for moderate pain (pain score 4-6) or severe pain (pain score 7-10).    Dispense:  20 tablet    Refill:  0    May request a refill    Return for F/U, [name] PCP for PET scan results .   Total time spent:30 Minutes Time spent includes review of chart, medications, test results, and follow up plan with the patient.   [place] Controlled Substance Database was reviewed by me.  This patient was seen by Danielle Pons, FNP-C in collaboration with Dr. Verneta Ross as a part of collaborative care agreement.   [name] R. Danielle Burow, MSN, FNP-C Internal medicine

## 2023-10-15 ENCOUNTER — Ambulatory Visit
Admission: RE | Admit: 2023-10-15 | Discharge: 2023-10-15 | Disposition: A | Discharge status: Home / Self Care | Payer: MEDICAID | Attending: Nurse Practitioner | Admitting: Nurse Practitioner

## 2023-10-15 ENCOUNTER — Ambulatory Visit
Admission: RE | Admit: 2023-10-15 | Discharge: 2023-10-15 | Disposition: A | Discharge status: Home / Self Care | Payer: MEDICAID | Source: Ambulatory Visit | Attending: Nurse Practitioner | Admitting: Nurse Practitioner

## 2023-10-15 DIAGNOSIS — M5441 Lumbago with sciatica, right side: Secondary | ICD-10-CM

## 2023-10-16 ENCOUNTER — Ambulatory Visit: Payer: MEDICAID

## 2023-10-16 ENCOUNTER — Telehealth: Payer: Self-pay | Admitting: Nurse Practitioner

## 2023-10-16 NOTE — Telephone Encounter (Signed)
 Left vm and sent mychart message to confirm 10/23/23 appointment-[name]

## 2023-10-18 ENCOUNTER — Ambulatory Visit: Payer: MEDICAID | Admitting: Nurse Practitioner

## 2023-10-18 ENCOUNTER — Encounter: Payer: Self-pay | Admitting: Nurse Practitioner

## 2023-10-18 ENCOUNTER — Telehealth: Payer: Self-pay | Admitting: Nurse Practitioner

## 2023-10-18 VITALS — BP 110/82 | HR 139 | Temp 98.3°F | Resp 16 | Ht 65.0 in | Wt 229.0 lb

## 2023-10-18 DIAGNOSIS — R0789 Other chest pain: Secondary | ICD-10-CM | POA: Diagnosis not present

## 2023-10-18 DIAGNOSIS — R000 Tachycardia, unspecified: Secondary | ICD-10-CM | POA: Diagnosis not present

## 2023-10-18 DIAGNOSIS — M5441 Lumbago with sciatica, right side: Secondary | ICD-10-CM

## 2023-10-18 MED ORDER — TRAMADOL HCL 50 MG PO TABS: 50.0000 mg | ORAL_TABLET | Freq: Four times a day (QID) | ORAL | 2 refills | Status: DC | PRN

## 2023-10-18 NOTE — Telephone Encounter (Signed)
 Received call from [facility] w/ [facility] precert department. Patient scheduled for PET 10/22/23, but PA has not been completed. Still waiting on 10/09/23 notes to be closed to submit to insurance. Lvm w/ Melissa to cancel PET, until PA has been completed-[name]

## 2023-10-18 NOTE — Progress Notes (Signed)
 [facility] [address] [place], [place] [address]  Internal MEDICINE  Office Visit Note  Patient Name: [name]  [id number]  [id number]  Date of Service: 10/18/2023  Chief Complaint  Patient presents with   Acute Visit    Back pain     HPI Chantra presents for an acute sick visit for continued low back pain.  She was seen in late march for low back pain Prn tramadol  was given to the patient and she reports that this has helped but she has run out of the tramadol . The back pain remains unchanged otherwise and she reports having difficulty getting up and sitting down and increased pain when walking as well.  The xray of her lumbar spine showed spondylosis and anterior spurring of multiple levels. Still having intermittent chest pains and still having tachycardia as high as 150 today but improved to 139 by the end of the visit. Has upcoming appt with cardiology next Monday.    Current Medication:  Outpatient Encounter Medications as of 10/18/2023  Medication Sig   albuterol  (VENTOLIN  HFA) 108 (90 Base) MCG/ACT inhaler Inhale 2 puffs into the lungs every 4 (four) hours as needed for wheezing or shortness of breath.   busPIRone (BUSPAR) 15 MG tablet Take 15 mg by mouth 2 (two) times daily.   Cholecalciferol (VITAMIN D3) 25 MCG (1000 UT) CAPS Take 1 capsule (1,000 Units total) by mouth daily.   cyclobenzaprine  (FLEXERIL ) 10 MG tablet Take 1 tablet (10 mg total) by mouth 3 (three) times daily as needed for muscle spasms.   desonide  (DESOWEN ) 0.05 % ointment Apply 1 gram to the skin on both elbows daily as needed for dry skin/rash   dicyclomine  (BENTYL ) 20 MG tablet Take 1 tablet (20 mg total) by mouth 4 (four) times daily -  before meals and at bedtime. FOR IBS   diltiazem (CARDIZEM) 30 MG tablet Take by mouth.   ferrous sulfate 325 (65 FE) MG EC tablet Take by mouth.   fluticasone -salmeterol (ADVAIR HFA) 115-21 MCG/ACT inhaler Inhale 2 puffs into the lungs 2 (two)  times daily.   folic acid  (FOLVITE ) 1 MG tablet Take 1 tablet (1 mg total) by mouth daily.   ibuprofen  (ADVIL ) 600 MG tablet Take 1 tablet (600 mg total) by mouth every 6 (six) hours as needed.   meloxicam  (MOBIC ) 15 MG tablet Take 1 tablet (15 mg total) by mouth daily.   nortriptyline (PAMELOR) 10 MG capsule Take by mouth.   omeprazole  (PRILOSEC) 40 MG capsule TAKE 1 CAPSULE BY MOUTH ONCE DAILY FOR  HEARTBURN   ondansetron  (ZOFRAN ) 4 MG tablet Take 1 tablet (4 mg total) by mouth every 8 (eight) hours as needed for nausea or vomiting.   venlafaxine XR (EFFEXOR-XR) 37.5 MG 24 hr capsule Take 37.5 mg by mouth daily.   metoCLOPramide  (REGLAN ) 10 MG tablet Take 1 tablet (10 mg total) by mouth every 8 (eight) hours as needed for up to 5 days for nausea.   traMADol  (ULTRAM ) 50 MG tablet Take 1 tablet (50 mg total) by mouth every 6 (six) hours as needed for moderate pain (pain score 4-6) or severe pain (pain score 7-10).   No facility-administered encounter medications on file as of 10/18/2023.      Medical History: Past Medical History:  Diagnosis Date   Allergy    Anxiety    Asthma 11/24/2019   blood allergy testing done 11/24/19 to see what causes it to flare up   Bipolar 1 disorder (  HCC)    Bipolar disorder (HCC)    Bronchitis    Chronic cough    being worked up with Dr. Viva Grise   Depression    Dyspareunia in female 11/2019   Dysrhythmia    TACHY BUT NOT A PROBLEM   Ectopic pregnancy    had surgery   GERD (gastroesophageal reflux disease)    History of concussion 2016   Hyperlipidemia    Migraine    has an aura. happening frequently   MRSA (methicillin resistant Staphylococcus aureus)    was on chin.  many years ago   NSVD (normal spontaneous vaginal delivery)    x 2   Poor dentition    Pseudoseizures    related to anxiety   Scoliosis    Scoliosis      Vital Signs: BP 110/82   Pulse (!) 139 Comment: 151  Temp 98.3 F (36.8 C)   Resp 16   Ht 5\' 5"  (1.651 m)   Wt  229 lb (103.9 kg)   LMP 11/09/2019   SpO2 93%   BMI 38.11 kg/m    Review of Systems  Constitutional: Negative.  Negative for fatigue.  Respiratory: Negative.  Negative for cough, chest tightness, shortness of breath and wheezing.   Cardiovascular:  Positive for chest pain and palpitations.  Gastrointestinal: Negative.   Genitourinary: Negative.   Musculoskeletal:  Positive for arthralgias, back pain and myalgias.    Physical Exam Vitals reviewed.  Constitutional:      General: She is not in acute distress.    Appearance: Normal appearance. She is obese. She is ill-appearing.  HENT:     Head: Normocephalic and atraumatic.  Eyes:     Pupils: Pupils are equal, round, and reactive to light.  Cardiovascular:     Rate and Rhythm: Regular rhythm. Tachycardia present.     Heart sounds: Normal heart sounds.  Pulmonary:     Effort: Pulmonary effort is normal. No respiratory distress.     Breath sounds: Normal breath sounds. No wheezing.  Neurological:     Mental Status: She is alert and oriented to person, place, and time.  Psychiatric:        Mood and Affect: Mood normal.        Behavior: Behavior normal.       Assessment/Plan: 1. Acute right-sided low back pain with right-sided sciatica (Primary) Referred to orthopedic surgery at [facility] clinic for further evaluation and treatment of her back pain. Tramadol  refills ordered to help control her pain and make it manageable while she waits to see orthopedic surgery.  - Ambulatory referral to Orthopedic Surgery - traMADol  (ULTRAM ) 50 MG tablet; Take 1 tablet (50 mg total) by mouth every 6 (six) hours as needed for moderate pain (pain score 4-6) or severe pain (pain score 7-10).  Dispense: 60 tablet; Refill: 2  2. Sinus tachycardia HR is still elevated as high at 150 but did improve to 139 by the end of the visit. Has upcoming appt on Monday with cardiology.   3. Atypical chest pain Has upcoming appt with cardiology on  Monday next week    General Counseling: Danasha verbalizes understanding of the findings of todays visit and agrees with plan of treatment. I have discussed any further diagnostic evaluation that may be needed or ordered today. We also reviewed her medications today. she has been encouraged to call the office with any questions or concerns that should arise related to todays visit.    Counseling:  Orders Placed This Encounter  Procedures   Ambulatory referral to Orthopedic Surgery    Meds ordered this encounter  Medications   traMADol  (ULTRAM ) 50 MG tablet    Sig: Take 1 tablet (50 mg total) by mouth every 6 (six) hours as needed for moderate pain (pain score 4-6) or severe pain (pain score 7-10).    Dispense:  60 tablet    Refill:  2    Refill    Return if symptoms worsen or fail to improve, for referred to orthpedic surgery and has appt with cardiology next week. .  [place] Controlled Substance Database was reviewed by me for overdose risk score (ORS)  Time spent:30 Minutes Time spent with patient included reviewing progress notes, labs, imaging studies, and discussing plan for follow up.   This patient was seen by Laurence Pons, FNP-C in collaboration with Dr. Verneta Gone as a part of collaborative care agreement.  Edeline Greening R. Bobbi Burow, MSN, FNP-C Internal Medicine

## 2023-10-19 ENCOUNTER — Encounter: Payer: Self-pay | Admitting: Nurse Practitioner

## 2023-10-21 ENCOUNTER — Telehealth: Payer: Self-pay | Admitting: Nurse Practitioner

## 2023-10-21 NOTE — Telephone Encounter (Signed)
 Urgent Orthopedic referral sent via [facility] to [facility].  Lvm notifying patient. Gave pt telephone# [phone]-[name]

## 2023-10-22 ENCOUNTER — Ambulatory Visit: Payer: MEDICAID

## 2023-10-23 ENCOUNTER — Ambulatory Visit: Payer: MEDICAID | Admitting: Internal Medicine

## 2023-10-23 DIAGNOSIS — R0602 Shortness of breath: Secondary | ICD-10-CM | POA: Diagnosis not present

## 2023-10-23 DIAGNOSIS — J4540 Moderate persistent asthma, uncomplicated: Secondary | ICD-10-CM

## 2023-10-24 ENCOUNTER — Ambulatory Visit: Payer: MEDICAID | Admitting: Nurse Practitioner

## 2023-10-28 ENCOUNTER — Ambulatory Visit
Admission: RE | Admit: 2023-10-28 | Discharge: 2023-10-28 | Disposition: A | Discharge status: Home / Self Care | Payer: MEDICAID | Source: Ambulatory Visit | Attending: Nurse Practitioner | Admitting: Nurse Practitioner

## 2023-10-28 ENCOUNTER — Ambulatory Visit: Payer: MEDICAID

## 2023-10-28 DIAGNOSIS — D72828 Other elevated white blood cell count: Secondary | ICD-10-CM | POA: Insufficient documentation

## 2023-10-28 DIAGNOSIS — R590 Localized enlarged lymph nodes: Secondary | ICD-10-CM | POA: Insufficient documentation

## 2023-10-28 DIAGNOSIS — R9389 Abnormal findings on diagnostic imaging of other specified body structures: Secondary | ICD-10-CM | POA: Insufficient documentation

## 2023-10-28 DIAGNOSIS — R599 Enlarged lymph nodes, unspecified: Secondary | ICD-10-CM

## 2023-10-28 LAB — GLUCOSE, CAPILLARY: Glucose-Capillary: 125 mg/dL — ABNORMAL HIGH (ref 70–99)

## 2023-10-28 MED ORDER — FLUDEOXYGLUCOSE F - 18 (FDG) INJECTION: 11.8800 | Freq: Once | INTRAVENOUS | Status: DC | PRN

## 2023-10-30 ENCOUNTER — Telehealth: Payer: Self-pay | Admitting: Nurse Practitioner

## 2023-10-30 NOTE — Telephone Encounter (Signed)
 Orthopedic appointment 11/13/2023 @ [facility]-[name]

## 2023-11-07 ENCOUNTER — Ambulatory Visit: Payer: MEDICAID | Admitting: Nurse Practitioner

## 2023-11-07 ENCOUNTER — Telehealth: Payer: Self-pay | Admitting: Nurse Practitioner

## 2023-11-07 ENCOUNTER — Encounter: Payer: Self-pay | Admitting: Nurse Practitioner

## 2023-11-07 VITALS — BP 136/88 | HR 144 | Temp 98.4°F | Resp 16 | Ht 65.0 in | Wt 218.8 lb

## 2023-11-07 DIAGNOSIS — R0602 Shortness of breath: Secondary | ICD-10-CM | POA: Diagnosis not present

## 2023-11-07 DIAGNOSIS — R000 Tachycardia, unspecified: Secondary | ICD-10-CM

## 2023-11-07 DIAGNOSIS — R0789 Other chest pain: Secondary | ICD-10-CM

## 2023-11-07 DIAGNOSIS — Z79899 Other long term (current) drug therapy: Secondary | ICD-10-CM

## 2023-11-07 DIAGNOSIS — J9859 Other diseases of mediastinum, not elsewhere classified: Secondary | ICD-10-CM

## 2023-11-07 MED ORDER — MELOXICAM 15 MG PO TABS: 15.0000 mg | ORAL_TABLET | Freq: Every day | ORAL | 1 refills | Status: DC

## 2023-11-07 MED ORDER — ONDANSETRON HCL 4 MG PO TABS
4.0000 mg | ORAL_TABLET | Freq: Three times a day (TID) | ORAL | 4 refills | Status: DC | PRN
Start: 2023-11-07 — End: 2023-12-05

## 2023-11-07 MED ORDER — FOLIC ACID 1 MG PO TABS: 1.0000 mg | ORAL_TABLET | Freq: Every day | ORAL | 1 refills | Status: AC

## 2023-11-07 NOTE — Telephone Encounter (Signed)
 Notified patient of MRI appointment date, arrival time, location-[name]

## 2023-11-07 NOTE — Progress Notes (Signed)
 [facility] [address] [place], [place] [address]  Internal MEDICINE  Office Visit Note  Patient Name: [name]  [id number]  [id number]  Date of Service: 11/07/2023  Chief Complaint  Patient presents with   Depression   Gastroesophageal Reflux   Hyperlipidemia   Follow-up    Review pft    HPI [name] presents for a follow-up visit for chest pain, high HR, imaging results, PFT results, SOB and back pain.  Sinus tachycardia -- has done a stress test and has an upcoming appt with cardioogy to review this PET CT chest reviewed with patient -- possible mediastinal mass requiring further imaging via MRI to determine next steps. CT surgery referral expected. The concerning mass was non-enhancing and not suspicious for malignancy but possible differential diagnosis included schwannoma, cystic adenopathy, thymoma, pericardial cyst among others.  SOB -- PFT was normal as compared to previous PFT done in 2021. SOB most likely related to cardiac issues.  Back pain -- has appt with orthopedic surgery next week.     Current Medication: Outpatient Encounter Medications as of 11/07/2023  Medication Sig   albuterol  (VENTOLIN  HFA) 108 (90 Base) MCG/ACT inhaler Inhale 2 puffs into the lungs every 4 (four) hours as needed for wheezing or shortness of breath.   busPIRone (BUSPAR) 15 MG tablet Take 15 mg by mouth 2 (two) times daily.   Cholecalciferol (VITAMIN D3) 25 MCG (1000 UT) CAPS Take 1 capsule (1,000 Units total) by mouth daily.   cyclobenzaprine  (FLEXERIL ) 10 MG tablet Take 1 tablet (10 mg total) by mouth 3 (three) times daily as needed for muscle spasms.   desonide  (DESOWEN ) 0.05 % ointment Apply 1 gram to the skin on both elbows daily as needed for dry skin/rash   dicyclomine  (BENTYL ) 20 MG tablet Take 1 tablet (20 mg total) by mouth 4 (four) times daily -  before meals and at bedtime. FOR IBS   diltiazem (CARDIZEM) 30 MG tablet Take by mouth.   ferrous sulfate 325 (65  FE) MG EC tablet Take by mouth.   fluticasone -salmeterol (ADVAIR HFA) 115-21 MCG/ACT inhaler Inhale 2 puffs into the lungs 2 (two) times daily.   ibuprofen  (ADVIL ) 600 MG tablet Take 1 tablet (600 mg total) by mouth every 6 (six) hours as needed.   nortriptyline (PAMELOR) 10 MG capsule Take by mouth.   omeprazole  (PRILOSEC) 40 MG capsule TAKE 1 CAPSULE BY MOUTH ONCE DAILY FOR  HEARTBURN   traMADol  (ULTRAM ) 50 MG tablet Take 1 tablet (50 mg total) by mouth every 6 (six) hours as needed for moderate pain (pain score 4-6) or severe pain (pain score 7-10).   venlafaxine XR (EFFEXOR-XR) 37.5 MG 24 hr capsule Take 37.5 mg by mouth daily.   [DISCONTINUED] folic acid  (FOLVITE ) 1 MG tablet Take 1 tablet (1 mg total) by mouth daily.   [DISCONTINUED] meloxicam  (MOBIC ) 15 MG tablet Take 1 tablet (15 mg total) by mouth daily.   [DISCONTINUED] ondansetron  (ZOFRAN ) 4 MG tablet Take 1 tablet (4 mg total) by mouth every 8 (eight) hours as needed for nausea or vomiting.   folic acid  (FOLVITE ) 1 MG tablet Take 1 tablet (1 mg total) by mouth daily.   meloxicam  (MOBIC ) 15 MG tablet Take 1 tablet (15 mg total) by mouth daily.   metoCLOPramide  (REGLAN ) 10 MG tablet Take 1 tablet (10 mg total) by mouth every 8 (eight) hours as needed for up to 5 days for nausea.   ondansetron  (ZOFRAN ) 4 MG tablet Take 1 tablet (  4 mg total) by mouth every 8 (eight) hours as needed for nausea or vomiting.   No facility-administered encounter medications on file as of 11/07/2023.    Surgical History: Past Surgical History:  Procedure Laterality Date   ABDOMINAL HYSTERECTOMY     CARPAL TUNNEL RELEASE  2015   left   CYSTOSCOPY  11/27/2019   Procedure: CYSTOSCOPY;  Surgeon: Alben Alma, MD;  Location: [facility] ORS;  Service: Gynecology;;   ECTOPIC PREGNANCY SURGERY  2011   HERNIA REPAIR  2010   TOTAL LAPAROSCOPIC HYSTERECTOMY WITH SALPINGECTOMY Bilateral 11/27/2019   Procedure: TOTAL LAPAROSCOPIC HYSTERECTOMY WITH SALPINGECTOMY;   Surgeon: Alben Alma, MD;  Location: [facility] ORS;  Service: Gynecology;  Laterality: Bilateral;  needs RNFA   TUBAL LIGATION      Medical History: Past Medical History:  Diagnosis Date   Allergy    Anxiety    Asthma 11/24/2019   blood allergy testing done 11/24/19 to see what causes it to flare up   Bipolar 1 disorder (HCC)    Bipolar disorder (HCC)    Bronchitis    Chronic cough    being worked up with Dr. Viva Grise   Depression    Dyspareunia in female 11/2019   Dysrhythmia    TACHY BUT NOT A PROBLEM   Ectopic pregnancy    had surgery   GERD (gastroesophageal reflux disease)    History of concussion 2016   Hyperlipidemia    Migraine    has an aura. happening frequently   MRSA (methicillin resistant Staphylococcus aureus)    was on chin.  many years ago   NSVD (normal spontaneous vaginal delivery)    x 2   Poor dentition    Pseudoseizures    related to anxiety   Scoliosis    Scoliosis     Family History: Family History  Problem Relation Age of Onset   Asthma Mother    Heart disease Mother    Diabetes Mother    Hyperlipidemia Mother    Hypertension Mother    Mental illness Mother    Migraines Mother    Thyroid disease Mother    Stroke Mother    Alcohol abuse Mother    Bipolar disorder Mother    Anxiety disorder Mother    COPD Mother    Hypertension Sister    Depression Sister    Drug abuse Sister    Cancer Maternal Grandmother    Diabetes Maternal Grandmother    Heart disease Maternal Grandmother    Hyperlipidemia Maternal Grandmother    Hypertension Maternal Grandmother    Kidney disease Maternal Grandmother    Heart disease Paternal Grandfather    Alcohol abuse Paternal Grandfather    Drug abuse Paternal Grandfather     Social History   Socioeconomic History   Marital status: Divorced    Spouse name: Not on file   Number of children: 2   Years of education: Not on file   Highest education level: GED or equivalent  Occupational History    Not on file  Tobacco Use   Smoking status: Every Day    Current packs/day: 0.00    Average packs/day: 1 pack/day for 15.0 years (15.0 ttl pk-yrs)    Types: Cigarettes    Start date: 09/07/2019    Last attempt to quit: 09/23/2019    Years since quitting: 4.1   Smokeless tobacco: Never   Tobacco comments:    did not want to quit but can't inhale w/ coughing  Vaping Use  Vaping status: Never Used  Substance and Sexual Activity   Alcohol use: No    Alcohol/week: 0.0 standard drinks of alcohol   Drug use: No   Sexual activity: Yes    Partners: Male    Birth control/protection: Surgical, None  Other Topics Concern   Not on file  Social History Narrative   Lives with fiance and 2 kids.   Social Drivers of Corporate investment banker Strain: Low Risk  (10/03/2023)   Received from [facility]   Overall Financial Resource Strain (CARDIA)    Difficulty of Paying Living Expenses: Not very hard  Food Insecurity: Food Insecurity Present (10/03/2023)   [facility]   Hunger Vital Sign    Worried About Running Out of Food in the Last Year: Sometimes true    Ran Out of Food in the Last Year: Sometimes true  Transportation Needs: No Transportation Needs (10/03/2023)   Received from [facility] - Transportation    In the past 12 months, has lack of transportation kept you from medical appointments or from getting medications?: No    Lack of Transportation (Non-Medical): No  Physical Activity: Sufficiently Active (11/12/2019)   Exercise Vital Sign    Days of Exercise per Week: 5 days    Minutes of Exercise per Session: 80 min  Stress: Stress Concern Present (11/12/2019)   [employer] of Occupational Health - Occupational Stress Questionnaire    Feeling of Stress : Very much  Social Connections: Moderately Isolated (11/12/2019)   Social Connection and Isolation Panel [NHANES]    Frequency of Communication with  Friends and Family: More than three times a week    Frequency of Social Gatherings with Friends and Family: Once a week    Attends Religious Services: Never    Database administrator or Organizations: No    Attends Banker Meetings: Never    Marital Status: Living with partner  Intimate Partner Violence: Not At Risk (03/29/2023)   Humiliation, Afraid, Rape, and Kick questionnaire    Fear of Current or Ex-Partner: No    Emotionally Abused: No    Physically Abused: No    Sexually Abused: No      Review of Systems  Constitutional: Negative.  Negative for fatigue.  Respiratory: Negative.  Negative for cough, chest tightness, shortness of breath and wheezing.   Cardiovascular:  Positive for chest pain and palpitations.  Gastrointestinal: Negative.   Genitourinary: Negative.   Musculoskeletal:  Positive for arthralgias, back pain and myalgias.    Vital Signs: BP 136/88   Pulse (!) 144   Temp 98.4 F (36.9 C)   Resp 16   Ht 5\' 5"  (1.651 m)   Wt 218 lb 12.8 oz (99.2 kg)   LMP 11/09/2019   SpO2 98%   BMI 36.41 kg/m    Physical Exam Vitals reviewed.  Constitutional:      General: She is not in acute distress.    Appearance: Normal appearance. She is obese. She is ill-appearing.  HENT:     Head: Normocephalic and atraumatic.  Eyes:     Pupils: Pupils are equal, round, and reactive to light.  Cardiovascular:     Rate and Rhythm: Regular rhythm. Tachycardia present.     Heart sounds: Normal heart sounds.  Pulmonary:     Effort: Pulmonary effort is normal. No respiratory distress.     Breath sounds: Normal breath sounds. No wheezing.  Neurological:  Mental Status: She is alert and oriented to person, place, and time.  Psychiatric:        Mood and Affect: Mood normal.        Behavior: Behavior normal.        Assessment/Plan: 1. Mediastinal mass MRI of chest ordered for further evaluation. Most likely will need a cardiothoracic surgery referral but  will follow up to discuss results.  - MR CHEST W WO CONTRAST; Future  2. SOB (shortness of breath) (Primary) PFT was normal. SOB is most likely due to cardiac issues.   3. Sinus tachycardia Upcoming appt to discuss stress test with cardiology next week  4. Atypical chest pain Upcoming appt to discuss stress test with cardiology next week  5. Encounter for medication review Medication list reviewed, updated and refills ordered.  - meloxicam  (MOBIC ) 15 MG tablet; Take 1 tablet (15 mg total) by mouth daily.  Dispense: 90 tablet; Refill: 1 - folic acid  (FOLVITE ) 1 MG tablet; Take 1 tablet (1 mg total) by mouth daily.  Dispense: 90 tablet; Refill: 1 - ondansetron  (ZOFRAN ) 4 MG tablet; Take 1 tablet (4 mg total) by mouth every 8 (eight) hours as needed for nausea or vomiting.  Dispense: 20 tablet; Refill: 4   General Counseling: [name] verbalizes understanding of the findings of todays visit and agrees with plan of treatment. I have discussed any further diagnostic evaluation that may be needed or ordered today. We also reviewed her medications today. she has been encouraged to call the office with any questions or concerns that should arise related to todays visit.    Orders Placed This Encounter  Procedures   MR CHEST W WO CONTRAST    Meds ordered this encounter  Medications   meloxicam  (MOBIC ) 15 MG tablet    Sig: Take 1 tablet (15 mg total) by mouth daily.    Dispense:  90 tablet    Refill:  1    Fill for 90 days   folic acid  (FOLVITE ) 1 MG tablet    Sig: Take 1 tablet (1 mg total) by mouth daily.    Dispense:  90 tablet    Refill:  1    Fill new script today   ondansetron  (ZOFRAN ) 4 MG tablet    Sig: Take 1 tablet (4 mg total) by mouth every 8 (eight) hours as needed for nausea or vomiting.    Dispense:  20 tablet    Refill:  4    Return in about 4 weeks (around 12/05/2023) for F/U, Nashla Althoff PCP for MRI results .   Total time spent:30 Minutes Time spent includes review  of chart, medications, test results, and follow up plan with the patient.   [place] Controlled Substance Database was reviewed by me.  This patient was seen by Laurence Pons, FNP-C in collaboration with Dr. Verneta Gone as a part of collaborative care agreement.   Juliya Magill R. Bobbi Burow, MSN, FNP-C Internal medicine

## 2023-11-13 ENCOUNTER — Ambulatory Visit
Admission: RE | Admit: 2023-11-13 | Discharge: 2023-11-13 | Disposition: A | Discharge status: Home / Self Care | Payer: MEDICAID | Source: Ambulatory Visit | Attending: Nurse Practitioner | Admitting: Nurse Practitioner

## 2023-11-13 DIAGNOSIS — J9859 Other diseases of mediastinum, not elsewhere classified: Secondary | ICD-10-CM | POA: Diagnosis present

## 2023-11-13 MED ORDER — GADOBUTROL 1 MMOL/ML IV SOLN
10.0000 mL | Freq: Once | INTRAVENOUS | Status: AC | PRN
  Administered 2023-11-13: 10 mL via INTRAVENOUS

## 2023-11-15 ENCOUNTER — Other Ambulatory Visit: Payer: Self-pay | Admitting: Sports Medicine

## 2023-11-15 DIAGNOSIS — M5416 Radiculopathy, lumbar region: Secondary | ICD-10-CM

## 2023-11-15 DIAGNOSIS — G8929 Other chronic pain: Secondary | ICD-10-CM

## 2023-11-15 DIAGNOSIS — M51362 Other intervertebral disc degeneration, lumbar region with discogenic back pain and lower extremity pain: Secondary | ICD-10-CM

## 2023-11-15 DIAGNOSIS — M47816 Spondylosis without myelopathy or radiculopathy, lumbar region: Secondary | ICD-10-CM

## 2023-11-20 ENCOUNTER — Other Ambulatory Visit: Payer: Self-pay | Admitting: Nurse Practitioner

## 2023-11-20 ENCOUNTER — Encounter: Payer: Self-pay | Admitting: Sports Medicine

## 2023-11-20 DIAGNOSIS — Z720 Tobacco use: Secondary | ICD-10-CM

## 2023-11-20 DIAGNOSIS — R0609 Other forms of dyspnea: Secondary | ICD-10-CM

## 2023-11-20 DIAGNOSIS — R079 Chest pain, unspecified: Secondary | ICD-10-CM

## 2023-11-20 DIAGNOSIS — R000 Tachycardia, unspecified: Secondary | ICD-10-CM

## 2023-11-20 DIAGNOSIS — R600 Localized edema: Secondary | ICD-10-CM

## 2023-11-22 ENCOUNTER — Encounter: Payer: Self-pay | Admitting: Sports Medicine

## 2023-11-26 ENCOUNTER — Encounter (HOSPITAL_COMMUNITY): Payer: Self-pay

## 2023-11-27 ENCOUNTER — Ambulatory Visit: Payer: MEDICAID | Admitting: Nurse Practitioner

## 2023-11-27 NOTE — Procedures (Signed)
 [facility] [address] [place], [address]    Complete Pulmonary Function Testing Interpretation:  FINDINGS:  The forced vital capacity is mildly decreased.  The FEV1 is within normal limits.  Postbronchodilator there was no significant change in FEV1.  Total lung capacity is normal.  Residual volume is increased.  Residual on prolonged respiration is increased.  FRC is normal.  The DLCO was mildly decreased but normal when corrected for alveolar volume.  IMPRESSION:  This pulmonary function study is within normal limits clinical correlation is recommended  Cordie Deters, MD [facility] Pulmonary Critical Care Medicine Sleep Medicine

## 2023-11-28 ENCOUNTER — Other Ambulatory Visit (HOSPITAL_COMMUNITY): Payer: Self-pay | Admitting: *Deleted

## 2023-11-28 ENCOUNTER — Ambulatory Visit
Admission: RE | Admit: 2023-11-28 | Discharge: 2023-11-28 | Disposition: A | Discharge status: Home / Self Care | Payer: MEDICAID | Source: Ambulatory Visit | Attending: Nurse Practitioner | Admitting: Nurse Practitioner

## 2023-11-28 DIAGNOSIS — Z720 Tobacco use: Secondary | ICD-10-CM

## 2023-11-28 DIAGNOSIS — R000 Tachycardia, unspecified: Secondary | ICD-10-CM

## 2023-11-28 DIAGNOSIS — R0609 Other forms of dyspnea: Secondary | ICD-10-CM

## 2023-11-28 DIAGNOSIS — R079 Chest pain, unspecified: Secondary | ICD-10-CM

## 2023-11-28 DIAGNOSIS — R600 Localized edema: Secondary | ICD-10-CM

## 2023-11-28 MED ORDER — METOPROLOL TARTRATE 100 MG PO TABS: ORAL_TABLET | ORAL | 0 refills | Status: DC

## 2023-11-28 MED ORDER — IVABRADINE HCL 7.5 MG PO TABS: ORAL_TABLET | ORAL | 0 refills | Status: DC

## 2023-11-28 MED ORDER — IOHEXOL 350 MG/ML SOLN
80.0000 mL | Freq: Once | INTRAVENOUS | Status: DC | PRN
Start: 2023-11-28 — End: 2023-11-28

## 2023-11-28 NOTE — Progress Notes (Signed)
 Patient arrived for Cardiac CT. Take Diltiazem 180mg  daily. Heart rate in office 144. States has a high heart rate all the time. Currently heart rate range 96-116. Notified Dr Junnie Olives reschedule CT with pre medication. Metoprolol 100mg  and Ivabradine 15mg  Patient verbalized understanding of plan of care.

## 2023-11-30 ENCOUNTER — Ambulatory Visit
Admission: RE | Admit: 2023-11-30 | Discharge: 2023-11-30 | Disposition: A | Discharge status: Home / Self Care | Payer: MEDICAID | Source: Ambulatory Visit | Attending: Sports Medicine | Admitting: Sports Medicine

## 2023-11-30 DIAGNOSIS — M47816 Spondylosis without myelopathy or radiculopathy, lumbar region: Secondary | ICD-10-CM

## 2023-11-30 DIAGNOSIS — M5416 Radiculopathy, lumbar region: Secondary | ICD-10-CM

## 2023-11-30 DIAGNOSIS — G8929 Other chronic pain: Secondary | ICD-10-CM

## 2023-11-30 DIAGNOSIS — M51362 Other intervertebral disc degeneration, lumbar region with discogenic back pain and lower extremity pain: Secondary | ICD-10-CM

## 2023-12-05 ENCOUNTER — Telehealth: Payer: Self-pay | Admitting: Nurse Practitioner

## 2023-12-05 ENCOUNTER — Ambulatory Visit: Payer: MEDICAID | Admitting: Nurse Practitioner

## 2023-12-05 ENCOUNTER — Encounter: Payer: Self-pay | Admitting: Nurse Practitioner

## 2023-12-05 VITALS — BP 114/88 | HR 125 | Temp 98.2°F | Resp 16 | Ht 65.0 in | Wt 220.6 lb

## 2023-12-05 DIAGNOSIS — R112 Nausea with vomiting, unspecified: Secondary | ICD-10-CM | POA: Diagnosis not present

## 2023-12-05 DIAGNOSIS — J9859 Other diseases of mediastinum, not elsewhere classified: Secondary | ICD-10-CM | POA: Diagnosis not present

## 2023-12-05 DIAGNOSIS — R0789 Other chest pain: Secondary | ICD-10-CM

## 2023-12-05 MED ORDER — PROMETHAZINE HCL 12.5 MG PO TABS
12.5000 mg | ORAL_TABLET | Freq: Four times a day (QID) | ORAL | 3 refills | Status: AC | PRN
Start: 2023-12-05 — End: ?

## 2023-12-05 NOTE — Progress Notes (Signed)
 [facility] [address] [place], [place] [address]  Internal MEDICINE  Office Visit Note  Patient Name: [name]  [id number]  [id number]  Date of Service: 12/05/2023  Chief Complaint  Patient presents with   Follow-up    Review MRI     HPI [name] presents for a follow-up visit for mediastinal mass, nausea and vomiting and back pain.  Mediastinal mass -- reviewed MRI chest results with patient, the mass is determined to be most likely benign but a surgical consult is recommended due to the mass progressively enlarging and the young age of the patient.  Nausea and vomiting -- zofran  is not helping. [facility] is not scheduling her new patient appt because all of their providers are leaving. Need to send a referral somewhere else, patient prefers [facility]. Also needs an alternative medication since zofran  is not working anymore.  Recently had an MRI of her lumbar spine, and will follow up with orthopedic to discuss those results for her low back pain.     Current Medication: Outpatient Encounter Medications as of 12/05/2023  Medication Sig   promethazine  (PHENERGAN ) 12.5 MG tablet Take 1 tablet (12.5 mg total) by mouth every 6 (six) hours as needed for nausea or vomiting.   albuterol  (VENTOLIN  HFA) 108 (90 Base) MCG/ACT inhaler Inhale 2 puffs into the lungs every 4 (four) hours as needed for wheezing or shortness of breath.   busPIRone (BUSPAR) 15 MG tablet Take 15 mg by mouth 2 (two) times daily.   Cholecalciferol (VITAMIN D3) 25 MCG (1000 UT) CAPS Take 1 capsule (1,000 Units total) by mouth daily.   cyclobenzaprine  (FLEXERIL ) 10 MG tablet Take 1 tablet (10 mg total) by mouth 3 (three) times daily as needed for muscle spasms.   desonide  (DESOWEN ) 0.05 % ointment Apply 1 gram to the skin on both elbows daily as needed for dry skin/rash   dicyclomine  (BENTYL ) 20 MG tablet Take 1 tablet (20 mg total) by mouth 4 (four) times daily -  before meals and at bedtime. FOR IBS    diltiazem (CARDIZEM CD) 180 MG 24 hr capsule Take 180 mg by mouth daily.   diltiazem (CARDIZEM) 30 MG tablet Take by mouth.   ferrous sulfate 325 (65 FE) MG EC tablet Take by mouth.   fluticasone -salmeterol (ADVAIR HFA) 115-21 MCG/ACT inhaler Inhale 2 puffs into the lungs 2 (two) times daily.   folic acid  (FOLVITE ) 1 MG tablet Take 1 tablet (1 mg total) by mouth daily.   ibuprofen  (ADVIL ) 600 MG tablet Take 1 tablet (600 mg total) by mouth every 6 (six) hours as needed.   ivabradine  (CORLANOR) 7.5 MG TABS tablet Take tablets (15mg ) by mouth TWO hours prior to your cardiac CT scan.   meloxicam  (MOBIC ) 15 MG tablet Take 1 tablet (15 mg total) by mouth daily.   metoCLOPramide  (REGLAN ) 10 MG tablet Take 1 tablet (10 mg total) by mouth every 8 (eight) hours as needed for up to 5 days for nausea.   metoprolol  tartrate (LOPRESSOR ) 100 MG tablet Take tablet (100mg ) TWO hours prior to your cardiac CT scan.   nortriptyline (PAMELOR) 10 MG capsule Take by mouth.   omeprazole  (PRILOSEC) 40 MG capsule TAKE 1 CAPSULE BY MOUTH ONCE DAILY FOR  HEARTBURN   traMADol  (ULTRAM ) 50 MG tablet Take 1 tablet (50 mg total) by mouth every 6 (six) hours as needed for moderate pain (pain score 4-6) or severe pain (pain score 7-10).   venlafaxine XR (EFFEXOR-XR) 37.5 MG 24  hr capsule Take 37.5 mg by mouth daily.   [DISCONTINUED] ondansetron  (ZOFRAN ) 4 MG tablet Take 1 tablet (4 mg total) by mouth every 8 (eight) hours as needed for nausea or vomiting.   No facility-administered encounter medications on file as of 12/05/2023.    Surgical History: Past Surgical History:  Procedure Laterality Date   ABDOMINAL HYSTERECTOMY     CARPAL TUNNEL RELEASE  2015   left   CYSTOSCOPY  11/27/2019   Procedure: CYSTOSCOPY;  Surgeon: Alben Alma, MD;  Location: [facility] ORS;  Service: Gynecology;;   ECTOPIC PREGNANCY SURGERY  2011   HERNIA REPAIR  2010   TOTAL LAPAROSCOPIC HYSTERECTOMY WITH SALPINGECTOMY Bilateral 11/27/2019    Procedure: TOTAL LAPAROSCOPIC HYSTERECTOMY WITH SALPINGECTOMY;  Surgeon: Alben Alma, MD;  Location: [facility] ORS;  Service: Gynecology;  Laterality: Bilateral;  needs RNFA   TUBAL LIGATION      Medical History: Past Medical History:  Diagnosis Date   Allergy    Anxiety    Asthma 11/24/2019   blood allergy testing done 11/24/19 to see what causes it to flare up   Bipolar 1 disorder (HCC)    Bipolar disorder (HCC)    Bronchitis    Chronic cough    being worked up with Dr. Viva Grise   Depression    Dyspareunia in female 11/2019   Dysrhythmia    TACHY BUT NOT A PROBLEM   Ectopic pregnancy    had surgery   GERD (gastroesophageal reflux disease)    History of concussion 2016   Hyperlipidemia    Migraine    has an aura. happening frequently   MRSA (methicillin resistant Staphylococcus aureus)    was on chin.  many years ago   NSVD (normal spontaneous vaginal delivery)    x 2   Poor dentition    Pseudoseizures    related to anxiety   Scoliosis    Scoliosis     Family History: Family History  Problem Relation Age of Onset   Asthma Mother    Heart disease Mother    Diabetes Mother    Hyperlipidemia Mother    Hypertension Mother    Mental illness Mother    Migraines Mother    Thyroid disease Mother    Stroke Mother    Alcohol abuse Mother    Bipolar disorder Mother    Anxiety disorder Mother    COPD Mother    Hypertension Sister    Depression Sister    Drug abuse Sister    Cancer Maternal Grandmother    Diabetes Maternal Grandmother    Heart disease Maternal Grandmother    Hyperlipidemia Maternal Grandmother    Hypertension Maternal Grandmother    Kidney disease Maternal Grandmother    Heart disease Paternal Grandfather    Alcohol abuse Paternal Grandfather    Drug abuse Paternal Grandfather     Social History   Socioeconomic History   Marital status: Divorced    Spouse name: Not on file   Number of children: 2   Years of education: Not on file   Highest  education level: GED or equivalent  Occupational History   Not on file  Tobacco Use   Smoking status: Former    Current packs/day: 0.00    Average packs/day: 1 pack/day for 15.0 years (15.0 ttl pk-yrs)    Types: Cigarettes    Start date: 09/07/2019    Quit date: 09/23/2019    Years since quitting: 4.2   Smokeless tobacco: Never   Tobacco comments:  did not want to quit but can't inhale w/ coughing  Vaping Use   Vaping status: Never Used  Substance and Sexual Activity   Alcohol use: No    Alcohol/week: 0.0 standard drinks of alcohol   Drug use: No   Sexual activity: Yes    Partners: Male    Birth control/protection: Surgical, None  Other Topics Concern   Not on file  Social History Narrative   Lives with fiance and 2 kids.   Social Drivers of Health   Financial Resource Strain: Medium Risk (11/13/2023)   Received from [facility]   Overall Financial Resource Strain (CARDIA)    Difficulty of Paying Living Expenses: Somewhat hard  Food Insecurity: Food Insecurity Present (11/13/2023)   Received from [facility]   Hunger Vital Sign    Worried About Running Out of Food in the Last Year: Sometimes true    Ran Out of Food in the Last Year: Sometimes true  Transportation Needs: No Transportation Needs (11/13/2023)   Received from [facility] - [facility]    In the past 12 months, has lack of transportation kept you from medical appointments or from getting medications?: No    Lack of Transportation (Non-Medical): No  Physical Activity: Sufficiently Active (11/12/2019)   Exercise Vital Sign    Days of Exercise per Week: 5 days    Minutes of Exercise per Session: 80 min  Stress: Stress Concern Present (11/12/2019)   [employer] of Occupational [employer] - Occupational Stress Questionnaire    Feeling of Stress : Very much  Social Connections: Moderately Isolated (11/12/2019)   Social Connection and Isolation  Panel    Frequency of Communication with Friends and Family: More than three times a week    Frequency of Social Gatherings with Friends and Family: Once a week    Attends Religious Services: Never    Database administrator or Organizations: No    Attends Banker Meetings: Never    Marital Status: Living with partner  Intimate Partner Violence: Not At Risk (03/29/2023)   Humiliation, Afraid, Rape, and Kick questionnaire    Fear of Current or Ex-Partner: No    Emotionally Abused: No    Physically Abused: No    Sexually Abused: No      Review of Systems  Constitutional: Negative.  Negative for fatigue.  Respiratory: Negative.  Negative for cough, chest tightness, shortness of breath and wheezing.   Cardiovascular:  Positive for chest pain and palpitations.  Gastrointestinal: Negative.   Genitourinary: Negative.   Musculoskeletal:  Positive for arthralgias, back pain and myalgias.    Vital Signs: BP 114/88   Pulse (!) 125   Temp 98.2 F (36.8 C)   Resp 16   Ht 5' 5 (1.651 m)   Wt 220 lb 9.6 oz (100.1 kg)   LMP 11/09/2019   SpO2 98%   BMI 36.71 kg/m    Physical Exam Vitals reviewed.  Constitutional:      General: She is not in acute distress.    Appearance: Normal appearance. She is obese. She is ill-appearing.  HENT:     Head: Normocephalic and atraumatic.   Eyes:     Pupils: Pupils are equal, round, and reactive to light.    Cardiovascular:     Rate and Rhythm: Regular rhythm. Tachycardia present.     Heart sounds: Normal heart sounds.  Pulmonary:     Effort: Pulmonary effort is normal. No  respiratory distress.     Breath sounds: Normal breath sounds. No wheezing.   Neurological:     Mental Status: She is alert and oriented to person, place, and time.   Psychiatric:        Mood and Affect: Mood normal.        Behavior: Behavior normal.        Assessment/Plan: 1. Mediastinal mass (Primary) Referred to cardiothoracic surgery  -  Ambulatory referral to Cardiothoracic Surgery  2. Atypical chest pain Referred to cardiothoracic surgery and still seeing cardiology as well, has additional imaging to eval for blockages of coronary arteries - Ambulatory referral to Cardiothoracic Surgery  3. Nausea and vomiting, unspecified vomiting type Discontinue zofran , start promethazine  as needed. Referred to [facility].  - promethazine  (PHENERGAN ) 12.5 MG tablet; Take 1 tablet (12.5 mg total) by mouth every 6 (six) hours as needed for nausea or vomiting.  Dispense: 30 tablet; Refill: 3 - Ambulatory referral to Gastroenterology   General Counseling: [name] verbalizes understanding of the findings of todays visit and agrees with plan of treatment. I have discussed any further diagnostic evaluation that may be needed or ordered today. We also reviewed her medications today. she has been encouraged to call the office with any questions or concerns that should arise related to todays visit.    Orders Placed This Encounter  Procedures   Ambulatory referral to Cardiothoracic Surgery   Ambulatory referral to Gastroenterology    Meds ordered this encounter  Medications   promethazine  (PHENERGAN ) 12.5 MG tablet    Sig: Take 1 tablet (12.5 mg total) by mouth every 6 (six) hours as needed for nausea or vomiting.    Dispense:  30 tablet    Refill:  3    Fill new script today, discontinue ondansetron     Return if symptoms worsen or fail to improve, for keep scheduled F/U in october. has several specialist appts in the next few months.   Total time spent:30 Minutes Time spent includes review of chart, medications, test results, and follow up plan with the patient.   [place] Controlled Substance Database was reviewed by me.  This patient was seen by Laurence Pons, FNP-C in collaboration with Dr. Verneta Gone as a part of collaborative care agreement.   Weslynn Ke R. Bobbi Burow, MSN, FNP-C Internal medicine

## 2023-12-05 NOTE — Telephone Encounter (Signed)
 Urgent Gastroenterology referral faxed to [facility] per patient request; [phone]. Sent patient message-[name]

## 2023-12-17 ENCOUNTER — Encounter (HOSPITAL_COMMUNITY): Payer: Self-pay

## 2023-12-18 ENCOUNTER — Telehealth (HOSPITAL_COMMUNITY): Payer: Self-pay | Admitting: *Deleted

## 2023-12-18 NOTE — Telephone Encounter (Signed)
 Attempted to call patient regarding upcoming cardiac CT appointment. Left message on voicemail with name and callback number  Larey Brick RN Navigator Cardiac Imaging [facility] [phone] Office [phone] Cell

## 2023-12-19 ENCOUNTER — Ambulatory Visit
Admission: RE | Admit: 2023-12-19 | Discharge: 2023-12-19 | Disposition: A | Discharge status: Home / Self Care | Payer: MEDICAID | Source: Ambulatory Visit | Attending: Nurse Practitioner | Admitting: Nurse Practitioner

## 2023-12-19 DIAGNOSIS — R000 Tachycardia, unspecified: Secondary | ICD-10-CM | POA: Insufficient documentation

## 2023-12-19 DIAGNOSIS — R079 Chest pain, unspecified: Secondary | ICD-10-CM | POA: Diagnosis not present

## 2023-12-19 DIAGNOSIS — R600 Localized edema: Secondary | ICD-10-CM | POA: Insufficient documentation

## 2023-12-19 DIAGNOSIS — R0609 Other forms of dyspnea: Secondary | ICD-10-CM | POA: Diagnosis present

## 2023-12-19 DIAGNOSIS — Z720 Tobacco use: Secondary | ICD-10-CM | POA: Insufficient documentation

## 2023-12-19 MED ORDER — DILTIAZEM HCL 25 MG/5ML IV SOLN
INTRAVENOUS | Status: AC
  Filled 2023-12-19: qty 5

## 2023-12-19 MED ORDER — IOHEXOL 350 MG/ML SOLN
100.0000 mL | Freq: Once | INTRAVENOUS | Status: AC | PRN
  Administered 2023-12-19: 100 mL via INTRAVENOUS

## 2023-12-19 MED ORDER — NITROGLYCERIN 0.4 MG SL SUBL: 0.8000 mg | SUBLINGUAL_TABLET | Freq: Once | SUBLINGUAL | Status: DC

## 2023-12-19 MED ORDER — METOPROLOL TARTRATE 5 MG/5ML IV SOLN
INTRAVENOUS | Status: AC
  Filled 2023-12-19: qty 10

## 2023-12-19 MED ORDER — NITROGLYCERIN 0.4 MG SL SUBL
0.4000 mg | SUBLINGUAL_TABLET | Freq: Once | SUBLINGUAL | Status: AC
  Administered 2023-12-19: 0.4 mg via SUBLINGUAL
  Filled 2023-12-19: qty 25

## 2023-12-19 MED ORDER — METOPROLOL TARTRATE 5 MG/5ML IV SOLN
10.0000 mg | Freq: Once | INTRAVENOUS | Status: AC | PRN
  Administered 2023-12-19: 10 mg via INTRAVENOUS
  Filled 2023-12-19: qty 10

## 2023-12-19 MED ORDER — DILTIAZEM HCL 25 MG/5ML IV SOLN
10.0000 mg | INTRAVENOUS | Status: DC | PRN
  Filled 2023-12-19: qty 5

## 2023-12-19 NOTE — Progress Notes (Signed)
Patient tolerated procedure well. W/C to lobby.  Ambulate w/o difficulty. Denies light headedness or being dizzy. Encouraged to drink extra water today and reasoning explained. Verbalized understanding. All questions answered. ABC intact. No further needs. Discharge from procedure area w/o issues.

## 2024-01-01 NOTE — H&P (View-Only) (Signed)
 [address]       [place] [address]             [phone]                    [name] [facility] Medical Record #[id number] Date of Birth: Nov 23, 1985  Referring: Ruthell Lauraine FALCON, NP Primary Care: Liana Fish, NP Primary Cardiologist: None  Chief Complaint:    Chief Complaint  Patient presents with   Mediastinal Mass    New patient consult, MRI chest 5/21, PET 5/5, PFTs 4/30    History of Present Illness:    [name] 38 y.o. female presents for surgical evaluation of a cystic anterior mediastinal lesion.  This was found incidentally when she was being worked up for shortness of breath.  She has had a thorough evaluation which is ruled out any pulmonary or cardiac etiology.  She no longer smokes but continues to be short of breath and have some chest pain.    Past Medical History:  Diagnosis Date   Allergy    Anxiety    Asthma 11/24/2019   blood allergy testing done 11/24/19 to see what causes it to flare up   Bipolar 1 disorder (HCC)    Bipolar disorder (HCC)    Bronchitis    Chronic cough    being worked up with Dr. Tamea   Depression    Dyspareunia in female 11/2019   Dysrhythmia    TACHY BUT NOT A PROBLEM   Ectopic pregnancy    had surgery   GERD (gastroesophageal reflux disease)    History of concussion 2016   Hyperlipidemia    Migraine    has an aura. happening frequently   MRSA (methicillin resistant Staphylococcus aureus)    was on chin.  many years ago   NSVD (normal spontaneous vaginal delivery)    x 2   Poor dentition    Pseudoseizures    related to anxiety   Scoliosis    Scoliosis     Past Surgical History:  Procedure Laterality Date   ABDOMINAL HYSTERECTOMY     CARPAL TUNNEL RELEASE  2015   left   CYSTOSCOPY  11/27/2019   Procedure: CYSTOSCOPY;  Surgeon: Arloa Lamar SQUIBB, MD;  Location: [facility] ORS;  Service: Gynecology;;   ECTOPIC PREGNANCY SURGERY  2011   HERNIA REPAIR  2010   TOTAL  LAPAROSCOPIC HYSTERECTOMY WITH SALPINGECTOMY Bilateral 11/27/2019   Procedure: TOTAL LAPAROSCOPIC HYSTERECTOMY WITH SALPINGECTOMY;  Surgeon: Arloa Lamar SQUIBB, MD;  Location: [facility] ORS;  Service: Gynecology;  Laterality: Bilateral;  needs RNFA   TUBAL LIGATION      Family History  Problem Relation Age of Onset   Asthma Mother    Heart disease Mother    Diabetes Mother    Hyperlipidemia Mother    Hypertension Mother    Mental illness Mother    Migraines Mother    Thyroid disease Mother    Stroke Mother    Alcohol abuse Mother    Bipolar disorder Mother    Anxiety disorder Mother    COPD Mother    Hypertension Sister    Depression Sister    Drug abuse Sister    Cancer Maternal Grandmother    Diabetes Maternal Grandmother    Heart disease Maternal Grandmother    Hyperlipidemia Maternal Grandmother    Hypertension Maternal Grandmother    Kidney disease Maternal Grandmother    Heart disease Paternal Grandfather  Alcohol abuse Paternal Grandfather    Drug abuse Paternal Grandfather      Social History   Tobacco Use  Smoking Status Former   Current packs/day: 0.00   Average packs/day: 1 pack/day for 15.0 years (15.0 ttl pk-yrs)   Types: Cigarettes   Start date: 09/07/2019   Quit date: 09/23/2019   Years since quitting: 4.2  Smokeless Tobacco Never  Tobacco Comments   did not want to quit but can't inhale w/ coughing    Social History   Substance and Sexual Activity  Alcohol Use No   Alcohol/week: 0.0 standard drinks of alcohol     Allergies  Allergen Reactions   Robitussin (Alcohol Free) [Guaifenesin] Anaphylaxis   Strawberry Extract Rash    CAN'T EVEN TOUCH STRAWBERRIES   Aspirin Other (See Comments)    Nose bleeds   Chocolate Flavoring Agent (Non-Screening) Rash   Codeine Rash    Current Outpatient Medications  Medication Sig Dispense Refill   albuterol  (VENTOLIN  HFA) 108 (90 Base) MCG/ACT inhaler Inhale 2 puffs into the lungs every 4 (four) hours as  needed for wheezing or shortness of breath. 18 g 1   busPIRone (BUSPAR) 15 MG tablet Take 15 mg by mouth 2 (two) times daily.     Cholecalciferol (VITAMIN D3) 25 MCG (1000 UT) CAPS Take 1 capsule (1,000 Units total) by mouth daily.     cyclobenzaprine  (FLEXERIL ) 10 MG tablet Take 1 tablet (10 mg total) by mouth 3 (three) times daily as needed for muscle spasms. 60 tablet 0   desonide  (DESOWEN ) 0.05 % ointment Apply 1 gram to the skin on both elbows daily as needed for dry skin/rash 60 g 3   dicyclomine  (BENTYL ) 20 MG tablet Take 1 tablet (20 mg total) by mouth 4 (four) times daily -  before meals and at bedtime. FOR IBS 120 tablet 2   diltiazem  (CARDIZEM  CD) 180 MG 24 hr capsule Take 180 mg by mouth daily.     diltiazem  (CARDIZEM ) 30 MG tablet Take by mouth.     ferrous sulfate 325 (65 FE) MG EC tablet Take by mouth.     fluticasone -salmeterol (ADVAIR HFA) 115-21 MCG/ACT inhaler Inhale 2 puffs into the lungs 2 (two) times daily. 1 each 12   folic acid  (FOLVITE ) 1 MG tablet Take 1 tablet (1 mg total) by mouth daily. 90 tablet 1   ibuprofen  (ADVIL ) 600 MG tablet Take 1 tablet (600 mg total) by mouth every 6 (six) hours as needed. 30 tablet 0   ivabradine  (CORLANOR) 7.5 MG TABS tablet Take tablets (15mg ) by mouth TWO hours prior to your cardiac CT scan. 2 tablet 0   meloxicam  (MOBIC ) 15 MG tablet Take 1 tablet (15 mg total) by mouth daily. 90 tablet 1   metoCLOPramide  (REGLAN ) 10 MG tablet Take 1 tablet (10 mg total) by mouth every 8 (eight) hours as needed for up to 5 days for nausea. 15 tablet 0   metoprolol  tartrate (LOPRESSOR ) 100 MG tablet Take tablet (100mg ) TWO hours prior to your cardiac CT scan. 1 tablet 0   nortriptyline (PAMELOR) 10 MG capsule Take by mouth.     omeprazole  (PRILOSEC) 40 MG capsule TAKE 1 CAPSULE BY MOUTH ONCE DAILY FOR  HEARTBURN 90 capsule 0   promethazine  (PHENERGAN ) 12.5 MG tablet Take 1 tablet (12.5 mg total) by mouth every 6 (six) hours as needed for nausea or  vomiting. 30 tablet 3   traMADol  (ULTRAM ) 50 MG tablet Take 1 tablet (50 mg total) by  mouth every 6 (six) hours as needed for moderate pain (pain score 4-6) or severe pain (pain score 7-10). 60 tablet 2   venlafaxine XR (EFFEXOR-XR) 37.5 MG 24 hr capsule Take 37.5 mg by mouth daily.     No current facility-administered medications for this visit.    Review of Systems  Constitutional:  Negative for malaise/fatigue.  Respiratory:  Positive for shortness of breath.   Cardiovascular:  Positive for chest pain.  Neurological: Negative.     PHYSICAL EXAMINATION: BP 121/88 (BP Location: Right Arm, Patient Position: Sitting, Cuff Size: Normal)   Pulse (!) 101   Resp 20   Ht 5' 5 (1.651 m)   Wt 221 lb 1.6 oz (100.3 kg)   LMP 11/09/2019   SpO2 99% Comment: RA  BMI 36.79 kg/m   Physical Exam Constitutional:      General: She is not in acute distress.    Appearance: She is not ill-appearing.  HENT:     Head: Normocephalic and atraumatic.  Eyes:     Extraocular Movements: Extraocular movements intact.  Cardiovascular:     Rate and Rhythm: Tachycardia present.  Pulmonary:     Effort: Pulmonary effort is normal. No respiratory distress.  Abdominal:     General: There is no distension.  Musculoskeletal:        General: Normal range of motion.     Cervical back: Normal range of motion.  Skin:    General: Skin is warm and dry.  Neurological:     General: No focal deficit present.     Mental Status: She is alert and oriented to person, place, and time.      Diagnostic Studies & Laboratory data:     Recent Radiology Findings:   CT CORONARY MORPH W/CTA COR W/SCORE W/CA W/CM &/OR WO/CM Addendum Date: 12/22/2023 ADDENDUM REPORT: 12/22/2023 15:22 EXAM: OVER-READ INTERPRETATION  CT CHEST The following report is an over-read performed by radiologist Dr. Fonda Mom [facility] on 12/22/2023. This over-read does not include interpretation of cardiac or coronary anatomy or  pathology. The coronary CTA interpretation by the cardiologist is attached. COMPARISON:  10/08/2023. FINDINGS: Cardiovascular:  See findings discussed in the body of the report. Mediastinum/Nodes: Left pre-vascular 2 cm node is partially imaged in with seen the prior study. Imaged mediastinal structures are otherwise unremarkable. Lungs/Pleura: Imaged lungs are clear. No pleural effusion or pneumothorax. Upper Abdomen: No acute abnormality. Musculoskeletal: No chest wall abnormality. No acute osseous findings. IMPRESSION: No acute extra cardiac abnormality. Electronically Signed   By: Fonda Field M.D.   On: 12/22/2023 15:22   Result Date: 12/22/2023 CLINICAL DATA:  Chest pain EXAM: Cardiac/Coronary  CTA TECHNIQUE: The patient was scanned on a Siemens Somatom scanner. : A retrospective scan was triggered in the ascending thoracic aorta. Axial non-contrast 3 mm slices were carried out through the heart. The data set was analyzed on a dedicated work station and scored using the Agatson method. Gantry rotation speed was 66 msecs and collimation was .6 mm. 100mg  of metoprolol , 15mg  of ivabradine , and 0.4 mg of sl NTG was given. The 3D data set was reconstructed in 5% intervals of the 60-95 % of the R-R cycle. Diastolic phases were analyzed on a dedicated work station using MPR, MIP and VRT modes. The patient received 80 cc of contrast. FINDINGS: Aorta:  Normal size.  No calcifications.  No dissection. Aortic Valve:  Trileaflet.  No calcifications. Coronary Arteries:  Normal coronary origin.  Right dominance. RCA is a dominant  artery. There is no plaque. Left main gives rise to LAD and LCX arteries. LM has no disease. LAD has no plaque. LCX is a non-dominant artery.  There is no plaque. Other findings: Normal pulmonary vein drainage into the left atrium. Normal left atrial appendage without a thrombus. Normal size of the pulmonary artery. IMPRESSION: 1. Coronary calcium score of 0. 2. Normal coronary origin with  right dominance. 3. No evidence of CAD. 4. CAD-RADS 0.  Consider non-atherosclerotic causes of chest pain. Electronically Signed: By: Redell Cave M.D. On: 12/19/2023 13:27   MRI chest IMPRESSION: 1. 2.8 cm nonenhancing cystic lesion in the left prevascular space, favoring a benign lymphangioma, less likely benign pericardial or thymic cyst. The appearance is not considered suspicious for necrotic lymphadenopathy. However, given continued growth on priors, surgical consultation is suggested in a patient of this age.    I have independently reviewed the above radiology studies  and reviewed the findings with the patient.   Recent Lab Findings: Lab Results  Component Value Date   WBC 17.8 (H) 10/08/2023   HGB 13.2 10/08/2023   HCT 39.6 10/08/2023   PLT 377 10/08/2023   GLUCOSE 113 (H) 10/08/2023   CHOL 219 (H) 03/19/2023   TRIG 129 03/19/2023   HDL 39 (L) 03/19/2023   LDLCALC 157 (H) 03/19/2023   ALT 20 07/14/2023   AST 23 07/14/2023   NA 136 10/08/2023   K 3.5 10/08/2023   CL 103 10/08/2023   CREATININE 0.89 10/08/2023   BUN 11 10/08/2023   CO2 21 (L) 10/08/2023   TSH 1.250 03/19/2023   HGBA1C 5.6 03/19/2023      Assessment / Plan:   38 year old female with a cystic lesion in her anterior mediastinum.  I do not think that this is the cause of her symptoms however she is adamant that she wants this removed.  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  We discussed the risks and benefits of a left robotic assisted thoracoscopy with resection of the cystic lesion.  She has agreed to proceed.      Linnie MALVA Rayas 01/03/2024 3:41 PM

## 2024-01-01 NOTE — Progress Notes (Signed)
 [address]       [place] [address]             [phone]                    [name] Medical Record #[id number] Date of Birth: Nov 23, 1985  Referring: Ruthell Lauraine FALCON, NP Primary Care: Liana Fish, NP Primary Cardiologist: None  Chief Complaint:    Chief Complaint  Patient presents with   Mediastinal Mass    New patient consult, MRI chest 5/21, PET 5/5, PFTs 4/30    History of Present Illness:    [name] 38 y.o. female presents for surgical evaluation of a cystic anterior mediastinal lesion.  This was found incidentally when she was being worked up for shortness of breath.  She has had a thorough evaluation which is ruled out any pulmonary or cardiac etiology.  She no longer smokes but continues to be short of breath and have some chest pain.    Past Medical History:  Diagnosis Date   Allergy    Anxiety    Asthma 11/24/2019   blood allergy testing done 11/24/19 to see what causes it to flare up   Bipolar 1 disorder (HCC)    Bipolar disorder (HCC)    Bronchitis    Chronic cough    being worked up with Dr. Tamea   Depression    Dyspareunia in female 11/2019   Dysrhythmia    TACHY BUT NOT A PROBLEM   Ectopic pregnancy    had surgery   GERD (gastroesophageal reflux disease)    History of concussion 2016   Hyperlipidemia    Migraine    has an aura. happening frequently   MRSA (methicillin resistant Staphylococcus aureus)    was on chin.  many years ago   NSVD (normal spontaneous vaginal delivery)    x 2   Poor dentition    Pseudoseizures    related to anxiety   Scoliosis    Scoliosis     Past Surgical History:  Procedure Laterality Date   ABDOMINAL HYSTERECTOMY     CARPAL TUNNEL RELEASE  2015   left   CYSTOSCOPY  11/27/2019   Procedure: CYSTOSCOPY;  Surgeon: Arloa Lamar SQUIBB, MD;  Location: [facility] ORS;  Service: Gynecology;;   ECTOPIC PREGNANCY SURGERY  2011   HERNIA REPAIR  2010   TOTAL  LAPAROSCOPIC HYSTERECTOMY WITH SALPINGECTOMY Bilateral 11/27/2019   Procedure: TOTAL LAPAROSCOPIC HYSTERECTOMY WITH SALPINGECTOMY;  Surgeon: Arloa Lamar SQUIBB, MD;  Location: [facility] ORS;  Service: Gynecology;  Laterality: Bilateral;  needs RNFA   TUBAL LIGATION      Family History  Problem Relation Age of Onset   Asthma Mother    Heart disease Mother    Diabetes Mother    Hyperlipidemia Mother    Hypertension Mother    Mental illness Mother    Migraines Mother    Thyroid disease Mother    Stroke Mother    Alcohol abuse Mother    Bipolar disorder Mother    Anxiety disorder Mother    COPD Mother    Hypertension Sister    Depression Sister    Drug abuse Sister    Cancer Maternal Grandmother    Diabetes Maternal Grandmother    Heart disease Maternal Grandmother    Hyperlipidemia Maternal Grandmother    Hypertension Maternal Grandmother    Kidney disease Maternal Grandmother    Heart disease Paternal Grandfather  Alcohol abuse Paternal Grandfather    Drug abuse Paternal Grandfather      Social History   Tobacco Use  Smoking Status Former   Current packs/day: 0.00   Average packs/day: 1 pack/day for 15.0 years (15.0 ttl pk-yrs)   Types: Cigarettes   Start date: 09/07/2019   Quit date: 09/23/2019   Years since quitting: 4.2  Smokeless Tobacco Never  Tobacco Comments   did not want to quit but can't inhale w/ coughing    Social History   Substance and Sexual Activity  Alcohol Use No   Alcohol/week: 0.0 standard drinks of alcohol     Allergies  Allergen Reactions   Robitussin (Alcohol Free) [Guaifenesin] Anaphylaxis   Strawberry Extract Rash    CAN'T EVEN TOUCH STRAWBERRIES   Aspirin Other (See Comments)    Nose bleeds   Chocolate Flavoring Agent (Non-Screening) Rash   Codeine Rash    Current Outpatient Medications  Medication Sig Dispense Refill   albuterol  (VENTOLIN  HFA) 108 (90 Base) MCG/ACT inhaler Inhale 2 puffs into the lungs every 4 (four) hours as  needed for wheezing or shortness of breath. 18 g 1   busPIRone (BUSPAR) 15 MG tablet Take 15 mg by mouth 2 (two) times daily.     Cholecalciferol (VITAMIN D3) 25 MCG (1000 UT) CAPS Take 1 capsule (1,000 Units total) by mouth daily.     cyclobenzaprine  (FLEXERIL ) 10 MG tablet Take 1 tablet (10 mg total) by mouth 3 (three) times daily as needed for muscle spasms. 60 tablet 0   desonide  (DESOWEN ) 0.05 % ointment Apply 1 gram to the skin on both elbows daily as needed for dry skin/rash 60 g 3   dicyclomine  (BENTYL ) 20 MG tablet Take 1 tablet (20 mg total) by mouth 4 (four) times daily -  before meals and at bedtime. FOR IBS 120 tablet 2   diltiazem  (CARDIZEM  CD) 180 MG 24 hr capsule Take 180 mg by mouth daily.     diltiazem  (CARDIZEM ) 30 MG tablet Take by mouth.     ferrous sulfate 325 (65 FE) MG EC tablet Take by mouth.     fluticasone -salmeterol (ADVAIR HFA) 115-21 MCG/ACT inhaler Inhale 2 puffs into the lungs 2 (two) times daily. 1 each 12   folic acid  (FOLVITE ) 1 MG tablet Take 1 tablet (1 mg total) by mouth daily. 90 tablet 1   ibuprofen  (ADVIL ) 600 MG tablet Take 1 tablet (600 mg total) by mouth every 6 (six) hours as needed. 30 tablet 0   ivabradine  (CORLANOR) 7.5 MG TABS tablet Take tablets (15mg ) by mouth TWO hours prior to your cardiac CT scan. 2 tablet 0   meloxicam  (MOBIC ) 15 MG tablet Take 1 tablet (15 mg total) by mouth daily. 90 tablet 1   metoCLOPramide  (REGLAN ) 10 MG tablet Take 1 tablet (10 mg total) by mouth every 8 (eight) hours as needed for up to 5 days for nausea. 15 tablet 0   metoprolol  tartrate (LOPRESSOR ) 100 MG tablet Take tablet (100mg ) TWO hours prior to your cardiac CT scan. 1 tablet 0   nortriptyline (PAMELOR) 10 MG capsule Take by mouth.     omeprazole  (PRILOSEC) 40 MG capsule TAKE 1 CAPSULE BY MOUTH ONCE DAILY FOR  HEARTBURN 90 capsule 0   promethazine  (PHENERGAN ) 12.5 MG tablet Take 1 tablet (12.5 mg total) by mouth every 6 (six) hours as needed for nausea or  vomiting. 30 tablet 3   traMADol  (ULTRAM ) 50 MG tablet Take 1 tablet (50 mg total) by  mouth every 6 (six) hours as needed for moderate pain (pain score 4-6) or severe pain (pain score 7-10). 60 tablet 2   venlafaxine XR (EFFEXOR-XR) 37.5 MG 24 hr capsule Take 37.5 mg by mouth daily.     No current facility-administered medications for this visit.    Review of Systems  Constitutional:  Negative for malaise/fatigue.  Respiratory:  Positive for shortness of breath.   Cardiovascular:  Positive for chest pain.  Neurological: Negative.     PHYSICAL EXAMINATION: BP 121/88 (BP Location: Right Arm, Patient Position: Sitting, Cuff Size: Normal)   Pulse (!) 101   Resp 20   Ht 5' 5 (1.651 m)   Wt 221 lb 1.6 oz (100.3 kg)   LMP 11/09/2019   SpO2 99% Comment: RA  BMI 36.79 kg/m   Physical Exam Constitutional:      General: She is not in acute distress.    Appearance: She is not ill-appearing.  HENT:     Head: Normocephalic and atraumatic.  Eyes:     Extraocular Movements: Extraocular movements intact.  Cardiovascular:     Rate and Rhythm: Tachycardia present.  Pulmonary:     Effort: Pulmonary effort is normal. No respiratory distress.  Abdominal:     General: There is no distension.  Musculoskeletal:        General: Normal range of motion.     Cervical back: Normal range of motion.  Skin:    General: Skin is warm and dry.  Neurological:     General: No focal deficit present.     Mental Status: She is alert and oriented to person, place, and time.      Diagnostic Studies & Laboratory data:     Recent Radiology Findings:   CT CORONARY MORPH W/CTA COR W/SCORE W/CA W/CM &/OR WO/CM Addendum Date: 12/22/2023 ADDENDUM REPORT: 12/22/2023 15:22 EXAM: OVER-READ INTERPRETATION  CT CHEST The following report is an over-read performed by radiologist Dr. Fonda Mom [facility] on 12/22/2023. This over-read does not include interpretation of cardiac or coronary anatomy or  pathology. The coronary CTA interpretation by the cardiologist is attached. COMPARISON:  10/08/2023. FINDINGS: Cardiovascular:  See findings discussed in the body of the report. Mediastinum/Nodes: Left pre-vascular 2 cm node is partially imaged in with seen the prior study. Imaged mediastinal structures are otherwise unremarkable. Lungs/Pleura: Imaged lungs are clear. No pleural effusion or pneumothorax. Upper Abdomen: No acute abnormality. Musculoskeletal: No chest wall abnormality. No acute osseous findings. IMPRESSION: No acute extra cardiac abnormality. Electronically Signed   By: Fonda Field M.D.   On: 12/22/2023 15:22   Result Date: 12/22/2023 CLINICAL DATA:  Chest pain EXAM: Cardiac/Coronary  CTA TECHNIQUE: The patient was scanned on a Siemens Somatom scanner. : A retrospective scan was triggered in the ascending thoracic aorta. Axial non-contrast 3 mm slices were carried out through the heart. The data set was analyzed on a dedicated work station and scored using the Agatson method. Gantry rotation speed was 66 msecs and collimation was .6 mm. 100mg  of metoprolol , 15mg  of ivabradine , and 0.4 mg of sl NTG was given. The 3D data set was reconstructed in 5% intervals of the 60-95 % of the R-R cycle. Diastolic phases were analyzed on a dedicated work station using MPR, MIP and VRT modes. The patient received 80 cc of contrast. FINDINGS: Aorta:  Normal size.  No calcifications.  No dissection. Aortic Valve:  Trileaflet.  No calcifications. Coronary Arteries:  Normal coronary origin.  Right dominance. RCA is a dominant  artery. There is no plaque. Left main gives rise to LAD and LCX arteries. LM has no disease. LAD has no plaque. LCX is a non-dominant artery.  There is no plaque. Other findings: Normal pulmonary vein drainage into the left atrium. Normal left atrial appendage without a thrombus. Normal size of the pulmonary artery. IMPRESSION: 1. Coronary calcium score of 0. 2. Normal coronary origin with  right dominance. 3. No evidence of CAD. 4. CAD-RADS 0.  Consider non-atherosclerotic causes of chest pain. Electronically Signed: By: Redell Cave M.D. On: 12/19/2023 13:27   MRI chest IMPRESSION: 1. 2.8 cm nonenhancing cystic lesion in the left prevascular space, favoring a benign lymphangioma, less likely benign pericardial or thymic cyst. The appearance is not considered suspicious for necrotic lymphadenopathy. However, given continued growth on priors, surgical consultation is suggested in a patient of this age.    I have independently reviewed the above radiology studies  and reviewed the findings with the patient.   Recent Lab Findings: Lab Results  Component Value Date   WBC 17.8 (H) 10/08/2023   HGB 13.2 10/08/2023   HCT 39.6 10/08/2023   PLT 377 10/08/2023   GLUCOSE 113 (H) 10/08/2023   CHOL 219 (H) 03/19/2023   TRIG 129 03/19/2023   HDL 39 (L) 03/19/2023   LDLCALC 157 (H) 03/19/2023   ALT 20 07/14/2023   AST 23 07/14/2023   NA 136 10/08/2023   K 3.5 10/08/2023   CL 103 10/08/2023   CREATININE 0.89 10/08/2023   BUN 11 10/08/2023   CO2 21 (L) 10/08/2023   TSH 1.250 03/19/2023   HGBA1C 5.6 03/19/2023      Assessment / Plan:   38 year old female with a cystic lesion in her anterior mediastinum.  I do not think that this is the cause of her symptoms however she is adamant that she wants this removed.  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  We discussed the risks and benefits of a left robotic assisted thoracoscopy with resection of the cystic lesion.  She has agreed to proceed.      [name] 01/03/2024 3:41 PM

## 2024-01-03 ENCOUNTER — Encounter: Payer: Self-pay | Admitting: Thoracic Surgery (Cardiothoracic Vascular Surgery)

## 2024-01-03 ENCOUNTER — Encounter: Payer: Self-pay | Admitting: *Deleted

## 2024-01-03 ENCOUNTER — Ambulatory Visit
Payer: MEDICAID | Attending: Thoracic Surgery (Cardiothoracic Vascular Surgery) | Admitting: Thoracic Surgery (Cardiothoracic Vascular Surgery)

## 2024-01-03 ENCOUNTER — Other Ambulatory Visit: Payer: Self-pay | Admitting: *Deleted

## 2024-01-03 VITALS — BP 121/88 | HR 101 | Resp 20 | Ht 65.0 in | Wt 221.1 lb

## 2024-01-03 DIAGNOSIS — Q248 Other specified congenital malformations of heart: Secondary | ICD-10-CM

## 2024-01-03 DIAGNOSIS — J9859 Other diseases of mediastinum, not elsewhere classified: Secondary | ICD-10-CM | POA: Diagnosis present

## 2024-01-14 LAB — PULMONARY FUNCTION TEST

## 2024-01-15 NOTE — Progress Notes (Signed)
 Surgical Instructions   Your procedure is scheduled on Monday July 28. Report to [facility] Main Entrance A at 8:00 A.M., then check in with the Admitting office. Any questions or running late day of surgery: call [phone]  Questions prior to your surgery date: call [phone], Monday-Friday, 8am-4pm. If you experience any cold or flu symptoms such as cough, fever, chills, shortness of breath, etc. between now and your scheduled surgery, please notify us  at the above number.     Remember:  Do not eat or drink anything after midnight the night before your surgery   Take these medicines the morning of surgery with A SIP OF WATER  busPIRone (BUSPAR)  dicyclomine  (BENTYL )  diltiazem  (CARDIZEM  CD)  fluticasone -salmeterol (ADVAIR HFA)  metoprolol  succinate (TOPROL -XL)  omeprazole  (PRILOSEC)   May take these medicines IF NEEDED: albuterol  (VENTOLIN  HFA) - Please bring inhaler to the hospital with you promethazine  (PHENERGAN )  traMADol  (ULTRAM )   One week prior to surgery, STOP taking any Aspirin (unless otherwise instructed by your surgeon) Aleve , Naproxen , Ibuprofen , Motrin , Advil , Goody's, BC's, all herbal medications, fish oil, and non-prescription vitamins.                     Do NOT Smoke (Tobacco/Vaping) for 24 hours prior to your procedure.  If you use a CPAP at night, you may bring your mask/headgear for your overnight stay.   You will be asked to remove any contacts, glasses, piercing's, hearing aid's, dentures/partials prior to surgery. Please bring cases for these items if needed.    Patients discharged the day of surgery will not be allowed to drive home, and someone needs to stay with them for 24 hours.  SURGICAL WAITING ROOM VISITATION Patients may have no more than 2 support people in the waiting area - these visitors may rotate.   Pre-op nurse will coordinate an appropriate time for 1 ADULT support person, who may not rotate, to accompany patient in pre-op.   Children under the age of 47 must have an adult with them who is not the patient and must remain in the main waiting area with an adult.  If the patient needs to stay at the hospital during part of their recovery, the visitor guidelines for inpatient rooms apply.  Please refer to the [facility] website for the visitor guidelines for any additional information.   If you received a COVID test during your pre-op visit  it is requested that you wear a mask when out in public, stay away from anyone that may not be feeling well and notify your surgeon if you develop symptoms. If you have been in contact with anyone that has tested positive in the last 10 days please notify you surgeon.      Pre-operative CHG Bathing Instructions   You can play a key role in reducing the risk of infection after surgery. Your skin needs to be as free of germs as possible. You can reduce the number of germs on your skin by washing with CHG (chlorhexidine  gluconate) soap before surgery. CHG is an antiseptic soap that kills germs and continues to kill germs even after washing.   DO NOT use if you have an allergy to chlorhexidine /CHG or antibacterial soaps. If your skin becomes reddened or irritated, stop using the CHG and notify one of our RNs at [phone].              TAKE A SHOWER THE NIGHT BEFORE SURGERY AND THE DAY OF SURGERY  Please keep in mind the following:  DO NOT shave, including legs and underarms, 48 hours prior to surgery.   You may shave your face before/day of surgery.  Place clean sheets on your bed the night before surgery Use a clean washcloth (not used since being washed) for each shower. DO NOT sleep with pet's night before surgery.  CHG Shower Instructions:  Wash your face and private area with normal soap. If you choose to wash your hair, wash first with your normal shampoo.  After you use shampoo/soap, rinse your hair and body thoroughly to remove shampoo/soap residue.  Turn the  water OFF and apply half the bottle of CHG soap to a CLEAN washcloth.  Apply CHG soap ONLY FROM YOUR NECK DOWN TO YOUR TOES (washing for 3-5 minutes)  DO NOT use CHG soap on face, private areas, open wounds, or sores.  Pay special attention to the area where your surgery is being performed.  If you are having back surgery, having someone wash your back for you may be helpful. Wait 2 minutes after CHG soap is applied, then you may rinse off the CHG soap.  Pat dry with a clean towel  Put on clean pajamas    Additional instructions for the day of surgery: DO NOT APPLY any lotions, deodorants, cologne, or perfumes.   Do not wear jewelry or makeup Do not wear nail polish, gel polish, artificial nails, or any other type of covering on natural nails (fingers and toes) Do not bring valuables to the hospital. [facility] is not responsible for valuables/personal belongings. Put on clean/comfortable clothes.  Please brush your teeth.  Ask your nurse before applying any prescription medications to the skin.

## 2024-01-16 ENCOUNTER — Encounter (HOSPITAL_COMMUNITY)
Admission: RE | Admit: 2024-01-16 | Discharge: 2024-01-16 | Disposition: A | Discharge status: Home / Self Care | Payer: MEDICAID | Source: Ambulatory Visit | Attending: Thoracic Surgery (Cardiothoracic Vascular Surgery) | Admitting: Thoracic Surgery (Cardiothoracic Vascular Surgery)

## 2024-01-16 ENCOUNTER — Encounter (HOSPITAL_COMMUNITY): Payer: Self-pay

## 2024-01-16 ENCOUNTER — Other Ambulatory Visit: Payer: Self-pay

## 2024-01-16 ENCOUNTER — Ambulatory Visit (HOSPITAL_COMMUNITY)
Admission: RE | Admit: 2024-01-16 | Discharge: 2024-01-16 | Disposition: A | Discharge status: Home / Self Care | Payer: MEDICAID | Source: Ambulatory Visit | Attending: Thoracic Surgery (Cardiothoracic Vascular Surgery) | Admitting: Thoracic Surgery (Cardiothoracic Vascular Surgery)

## 2024-01-16 VITALS — BP 110/86 | HR 100 | Temp 98.6°F | Resp 18 | Ht 65.0 in | Wt 223.0 lb

## 2024-01-16 DIAGNOSIS — Q248 Other specified congenital malformations of heart: Secondary | ICD-10-CM | POA: Diagnosis present

## 2024-01-16 DIAGNOSIS — Z01818 Encounter for other preprocedural examination: Secondary | ICD-10-CM | POA: Diagnosis present

## 2024-01-16 HISTORY — DX: Family history of other specified conditions: Z84.89

## 2024-01-16 LAB — COMPREHENSIVE METABOLIC PANEL WITH GFR
ALT: 41 U/L (ref 0–44)
AST: 31 U/L (ref 15–41)
Albumin: 3.4 g/dL — ABNORMAL LOW (ref 3.5–5.0)
Alkaline Phosphatase: 83 U/L (ref 38–126)
Anion gap: 9 (ref 5–15)
BUN: 6 mg/dL (ref 6–20)
CO2: 26 mmol/L (ref 22–32)
Calcium: 9.1 mg/dL (ref 8.9–10.3)
Chloride: 101 mmol/L (ref 98–111)
Creatinine, Ser: 0.71 mg/dL (ref 0.44–1.00)
GFR, Estimated: >60 mL/min (ref >60)
Glucose, Bld: 101 mg/dL — ABNORMAL HIGH (ref 70–99)
Potassium: 4.1 mmol/L (ref 3.5–5.1)
Sodium: 136 mmol/L (ref 135–145)
Total Bilirubin: 0.7 mg/dL (ref 0.0–1.2)
Total Protein: 7.2 g/dL (ref 6.5–8.1)

## 2024-01-16 LAB — CBC
HCT: 39.2 % (ref 36.0–46.0)
Hemoglobin: 13.2 g/dL (ref 12.0–15.0)
MCH: 28.8 pg (ref 26.0–34.0)
MCHC: 33.7 g/dL (ref 30.0–36.0)
MCV: 85.6 fL (ref 80.0–100.0)
Platelets: 329 K/uL (ref 150–400)
RBC: 4.58 MIL/uL (ref 3.87–5.11)
RDW: 13.1 % (ref 11.5–15.5)
WBC: 12.8 K/uL — ABNORMAL HIGH (ref 4.0–10.5)
nRBC: 0 % (ref 0.0–0.2)

## 2024-01-16 LAB — URINALYSIS, ROUTINE W REFLEX MICROSCOPIC
Bilirubin Urine: NEGATIVE
Glucose, UA: NEGATIVE mg/dL
Hgb urine dipstick: NEGATIVE
Ketones, ur: NEGATIVE mg/dL
Leukocytes,Ua: NEGATIVE
Nitrite: NEGATIVE
Protein, ur: NEGATIVE mg/dL
Specific Gravity, Urine: 1.006 (ref 1.005–1.030)
pH: 7 (ref 5.0–8.0)

## 2024-01-16 LAB — TYPE AND SCREEN
ABO/RH(D): O NEG
Antibody Screen: NEGATIVE

## 2024-01-16 LAB — SURGICAL PCR SCREEN
MRSA, PCR: NEGATIVE
Staphylococcus aureus: NEGATIVE

## 2024-01-16 LAB — PROTIME-INR
INR: 0.9 (ref 0.8–1.2)
Prothrombin Time: 13.1 s (ref 11.4–15.2)

## 2024-01-16 LAB — APTT: aPTT: 28 s (ref 24–36)

## 2024-01-16 NOTE — Progress Notes (Signed)
 PCP - Alyssa Abernathy,NP Cardiologist - [place], Keller Grist, MD,  Elodie Ronal Tinnie Launie, NP    PPM/ICD - denies Device Orders -  Rep Notified -   Chest x-ray - 01/16/24 EKG - 01/16/24 Stress Test - 11/04/23 CE -requested ECHO - 11/25/23 CE Cardiac Cath - denies  Sleep Study - has one scheduled 8/18;referred by neurologist because of headaches CPAP -   Fasting Blood Sugar - na Checks Blood Sugar _____ times a day  Last dose of GLP1 agonist- na  GLP1 instructions:   Blood Thinner Instructions:na Aspirin Instructions:na  ERAS Protcol -no PRE-SURGERY Ensure or G2-   COVID TEST- na   Anesthesia review: yes-hx of exertional chest pain,SVT,palpitations  Patient denies shortness of breath, fever, cough and chest pain at PAT appointment   All instructions explained to the patient, with a verbal understanding of the material. Patient agrees to go over the instructions while at home for a better understanding. The opportunity to ask questions was provided.

## 2024-01-20 ENCOUNTER — Inpatient Hospital Stay (HOSPITAL_COMMUNITY)
Admission: RE | Admit: 2024-01-20 | Discharge: 2024-01-21 | DRG: 271 | Disposition: A | Discharge status: Home / Self Care | Payer: MEDICAID | Attending: Thoracic Surgery (Cardiothoracic Vascular Surgery) | Admitting: Thoracic Surgery (Cardiothoracic Vascular Surgery)

## 2024-01-20 ENCOUNTER — Inpatient Hospital Stay (HOSPITAL_COMMUNITY): Payer: MEDICAID | Admitting: Anesthesiology

## 2024-01-20 ENCOUNTER — Other Ambulatory Visit: Payer: Self-pay

## 2024-01-20 ENCOUNTER — Encounter (HOSPITAL_COMMUNITY)
Admission: RE | Disposition: A | Discharge status: Home / Self Care | Payer: Self-pay | Source: Home / Self Care | Attending: Thoracic Surgery (Cardiothoracic Vascular Surgery)

## 2024-01-20 ENCOUNTER — Encounter (HOSPITAL_COMMUNITY): Payer: Self-pay | Admitting: Thoracic Surgery (Cardiothoracic Vascular Surgery)

## 2024-01-20 ENCOUNTER — Inpatient Hospital Stay (HOSPITAL_COMMUNITY): Payer: MEDICAID

## 2024-01-20 DIAGNOSIS — Z8249 Family history of ischemic heart disease and other diseases of the circulatory system: Secondary | ICD-10-CM | POA: Diagnosis not present

## 2024-01-20 DIAGNOSIS — Z825 Family history of asthma and other chronic lower respiratory diseases: Secondary | ICD-10-CM | POA: Diagnosis not present

## 2024-01-20 DIAGNOSIS — K219 Gastro-esophageal reflux disease without esophagitis: Secondary | ICD-10-CM | POA: Diagnosis present

## 2024-01-20 DIAGNOSIS — Z809 Family history of malignant neoplasm, unspecified: Secondary | ICD-10-CM

## 2024-01-20 DIAGNOSIS — Z7951 Long term (current) use of inhaled steroids: Secondary | ICD-10-CM | POA: Diagnosis not present

## 2024-01-20 DIAGNOSIS — Z87891 Personal history of nicotine dependence: Secondary | ICD-10-CM | POA: Diagnosis not present

## 2024-01-20 DIAGNOSIS — J45909 Unspecified asthma, uncomplicated: Secondary | ICD-10-CM | POA: Diagnosis present

## 2024-01-20 DIAGNOSIS — Q248 Other specified congenital malformations of heart: Secondary | ICD-10-CM

## 2024-01-20 DIAGNOSIS — Z811 Family history of alcohol abuse and dependence: Secondary | ICD-10-CM

## 2024-01-20 DIAGNOSIS — F3181 Bipolar II disorder: Secondary | ICD-10-CM | POA: Diagnosis present

## 2024-01-20 DIAGNOSIS — M419 Scoliosis, unspecified: Secondary | ICD-10-CM | POA: Diagnosis present

## 2024-01-20 DIAGNOSIS — Z833 Family history of diabetes mellitus: Secondary | ICD-10-CM

## 2024-01-20 DIAGNOSIS — E785 Hyperlipidemia, unspecified: Secondary | ICD-10-CM | POA: Diagnosis present

## 2024-01-20 DIAGNOSIS — Z791 Long term (current) use of non-steroidal anti-inflammatories (NSAID): Secondary | ICD-10-CM

## 2024-01-20 DIAGNOSIS — Z91018 Allergy to other foods: Secondary | ICD-10-CM | POA: Diagnosis not present

## 2024-01-20 DIAGNOSIS — Z8349 Family history of other endocrine, nutritional and metabolic diseases: Secondary | ICD-10-CM | POA: Diagnosis not present

## 2024-01-20 DIAGNOSIS — Z818 Family history of other mental and behavioral disorders: Secondary | ICD-10-CM

## 2024-01-20 DIAGNOSIS — J9811 Atelectasis: Secondary | ICD-10-CM | POA: Diagnosis not present

## 2024-01-20 DIAGNOSIS — I318 Other specified diseases of pericardium: Secondary | ICD-10-CM

## 2024-01-20 DIAGNOSIS — R222 Localized swelling, mass and lump, trunk: Secondary | ICD-10-CM | POA: Diagnosis present

## 2024-01-20 DIAGNOSIS — Z823 Family history of stroke: Secondary | ICD-10-CM

## 2024-01-20 DIAGNOSIS — Z813 Family history of other psychoactive substance abuse and dependence: Secondary | ICD-10-CM | POA: Diagnosis not present

## 2024-01-20 DIAGNOSIS — Z886 Allergy status to analgesic agent status: Secondary | ICD-10-CM | POA: Diagnosis not present

## 2024-01-20 DIAGNOSIS — Z79899 Other long term (current) drug therapy: Secondary | ICD-10-CM

## 2024-01-20 HISTORY — PX: XI ROBOTIC ASSISTED PERICARDIAL WINDOW: SHX6870

## 2024-01-20 HISTORY — PX: INTERCOSTAL NERVE BLOCK: SHX5021

## 2024-01-20 SURGERY — CREATION, PERICARDIAL WINDOW, ROBOT-ASSISTED
Anesthesia: General | Site: Chest | Laterality: Left

## 2024-01-20 MED ORDER — CHLORHEXIDINE GLUCONATE 0.12 % MT SOLN
15.0000 mL | Freq: Once | OROMUCOSAL | Status: AC
  Administered 2024-01-20: 15 mL via OROMUCOSAL
  Filled 2024-01-20: qty 15

## 2024-01-20 MED ORDER — LIDOCAINE HCL (CARDIAC) PF 100 MG/5ML IV SOSY
PREFILLED_SYRINGE | INTRAVENOUS | Status: DC | PRN
  Administered 2024-01-20: 80 mg via INTRAVENOUS

## 2024-01-20 MED ORDER — ACETAMINOPHEN 160 MG/5ML PO SOLN: 1000.0000 mg | Freq: Four times a day (QID) | ORAL | Status: DC

## 2024-01-20 MED ORDER — FENTANYL CITRATE (PF) 100 MCG/2ML IJ SOLN
25.0000 ug | INTRAMUSCULAR | Status: DC | PRN
  Administered 2024-01-20 (×4): 50 ug via INTRAVENOUS

## 2024-01-20 MED ORDER — ORAL CARE MOUTH RINSE: 15.0000 mL | Freq: Once | OROMUCOSAL | Status: AC

## 2024-01-20 MED ORDER — FERROUS SULFATE 325 (65 FE) MG PO TABS
325.0000 mg | ORAL_TABLET | Freq: Every day | ORAL | Status: DC
  Administered 2024-01-20 – 2024-01-21 (×2): 325 mg via ORAL
  Filled 2024-01-20 (×2): qty 1

## 2024-01-20 MED ORDER — FENTANYL CITRATE (PF) 100 MCG/2ML IJ SOLN: 25.0000 ug | INTRAMUSCULAR | Status: DC | PRN

## 2024-01-20 MED ORDER — BISACODYL 5 MG PO TBEC
10.0000 mg | DELAYED_RELEASE_TABLET | Freq: Every day | ORAL | Status: DC
  Administered 2024-01-20 – 2024-01-21 (×2): 10 mg via ORAL
  Filled 2024-01-20 (×2): qty 2

## 2024-01-20 MED ORDER — DEXAMETHASONE SODIUM PHOSPHATE 10 MG/ML IJ SOLN
INTRAMUSCULAR | Status: AC
  Filled 2024-01-20: qty 1

## 2024-01-20 MED ORDER — FLUTICASONE FUROATE-VILANTEROL 200-25 MCG/ACT IN AEPB
1.0000 | INHALATION_SPRAY | Freq: Every day | RESPIRATORY_TRACT | Status: DC
  Administered 2024-01-21: 1 via RESPIRATORY_TRACT
  Filled 2024-01-20: qty 28

## 2024-01-20 MED ORDER — ONDANSETRON HCL 4 MG/2ML IJ SOLN
INTRAMUSCULAR | Status: AC
  Filled 2024-01-20: qty 2

## 2024-01-20 MED ORDER — DEXAMETHASONE SODIUM PHOSPHATE 10 MG/ML IJ SOLN
INTRAMUSCULAR | Status: DC | PRN
  Administered 2024-01-20: 10 mg via INTRAVENOUS

## 2024-01-20 MED ORDER — OXYCODONE HCL 5 MG PO TABS: 5.0000 mg | ORAL_TABLET | Freq: Once | ORAL | Status: DC | PRN

## 2024-01-20 MED ORDER — KETOROLAC TROMETHAMINE 30 MG/ML IJ SOLN
INTRAMUSCULAR | Status: DC | PRN
  Administered 2024-01-20: 30 mg via INTRAVENOUS

## 2024-01-20 MED ORDER — ACETAMINOPHEN 10 MG/ML IV SOLN
INTRAVENOUS | Status: AC
  Filled 2024-01-20: qty 100

## 2024-01-20 MED ORDER — NORTRIPTYLINE HCL 10 MG PO CAPS
40.0000 mg | ORAL_CAPSULE | Freq: Every day | ORAL | Status: DC
  Administered 2024-01-20: 40 mg via ORAL
  Filled 2024-01-20 (×2): qty 4

## 2024-01-20 MED ORDER — FENTANYL CITRATE (PF) 100 MCG/2ML IJ SOLN
INTRAMUSCULAR | Status: AC
  Filled 2024-01-20: qty 2

## 2024-01-20 MED ORDER — LACTATED RINGERS IV SOLN: INTRAVENOUS | Status: DC

## 2024-01-20 MED ORDER — PROPOFOL 10 MG/ML IV BOLUS
INTRAVENOUS | Status: DC | PRN
  Administered 2024-01-20: 160 mg via INTRAVENOUS
  Administered 2024-01-20: 40 mg via INTRAVENOUS

## 2024-01-20 MED ORDER — PHENYLEPHRINE 80 MCG/ML (10ML) SYRINGE FOR IV PUSH (FOR BLOOD PRESSURE SUPPORT)
PREFILLED_SYRINGE | INTRAVENOUS | Status: DC | PRN
  Administered 2024-01-20 (×2): 80 ug via INTRAVENOUS

## 2024-01-20 MED ORDER — SODIUM CHLORIDE (PF) 0.9 % IJ SOLN
INTRAMUSCULAR | Status: AC
  Filled 2024-01-20: qty 50

## 2024-01-20 MED ORDER — ENOXAPARIN SODIUM 40 MG/0.4ML IJ SOSY
40.0000 mg | PREFILLED_SYRINGE | Freq: Every day | INTRAMUSCULAR | Status: DC
  Administered 2024-01-20: 40 mg via SUBCUTANEOUS
  Filled 2024-01-20: qty 0.4

## 2024-01-20 MED ORDER — PROPOFOL 10 MG/ML IV BOLUS
INTRAVENOUS | Status: AC
  Filled 2024-01-20: qty 20

## 2024-01-20 MED ORDER — BUPIVACAINE HCL (PF) 0.5 % IJ SOLN
INTRAMUSCULAR | Status: AC
  Filled 2024-01-20: qty 30

## 2024-01-20 MED ORDER — OXYCODONE HCL 5 MG PO TABS
5.0000 mg | ORAL_TABLET | ORAL | Status: DC | PRN
  Administered 2024-01-20 – 2024-01-21 (×3): 10 mg via ORAL
  Filled 2024-01-20 (×2): qty 2

## 2024-01-20 MED ORDER — 0.9 % SODIUM CHLORIDE (POUR BTL) OPTIME
TOPICAL | Status: DC | PRN
  Administered 2024-01-20: 2000 mL

## 2024-01-20 MED ORDER — DEXMEDETOMIDINE HCL IN NACL 80 MCG/20ML IV SOLN
INTRAVENOUS | Status: AC
  Filled 2024-01-20: qty 20

## 2024-01-20 MED ORDER — SENNOSIDES-DOCUSATE SODIUM 8.6-50 MG PO TABS
1.0000 | ORAL_TABLET | Freq: Every day | ORAL | Status: DC
  Administered 2024-01-20: 1 via ORAL
  Filled 2024-01-20: qty 1

## 2024-01-20 MED ORDER — ONDANSETRON HCL 4 MG/2ML IJ SOLN: 4.0000 mg | Freq: Four times a day (QID) | INTRAMUSCULAR | Status: DC | PRN

## 2024-01-20 MED ORDER — LUNG SURGERY BOOK
Freq: Once | Status: AC
  Filled 2024-01-20: qty 1

## 2024-01-20 MED ORDER — MIDAZOLAM HCL 2 MG/2ML IJ SOLN
INTRAMUSCULAR | Status: DC | PRN
  Administered 2024-01-20: 2 mg via INTRAVENOUS

## 2024-01-20 MED ORDER — ACETAMINOPHEN 10 MG/ML IV SOLN: 1000.0000 mg | Freq: Once | INTRAVENOUS | Status: DC | PRN

## 2024-01-20 MED ORDER — BUSPIRONE HCL 15 MG PO TABS
15.0000 mg | ORAL_TABLET | Freq: Two times a day (BID) | ORAL | Status: DC
  Administered 2024-01-20 – 2024-01-21 (×2): 15 mg via ORAL
  Filled 2024-01-20 (×3): qty 1

## 2024-01-20 MED ORDER — FENTANYL CITRATE PF 50 MCG/ML IJ SOSY
25.0000 ug | PREFILLED_SYRINGE | INTRAMUSCULAR | Status: DC | PRN
  Administered 2024-01-20 – 2024-01-21 (×4): 50 ug via INTRAVENOUS
  Filled 2024-01-20 (×5): qty 1

## 2024-01-20 MED ORDER — MIDAZOLAM HCL 2 MG/2ML IJ SOLN
INTRAMUSCULAR | Status: AC
  Filled 2024-01-20: qty 2

## 2024-01-20 MED ORDER — ROCURONIUM BROMIDE 10 MG/ML (PF) SYRINGE
PREFILLED_SYRINGE | INTRAVENOUS | Status: AC
  Filled 2024-01-20: qty 10

## 2024-01-20 MED ORDER — SUGAMMADEX SODIUM 200 MG/2ML IV SOLN
INTRAVENOUS | Status: AC
  Filled 2024-01-20: qty 2

## 2024-01-20 MED ORDER — SODIUM CHLORIDE FLUSH 0.9 % IV SOLN
INTRAVENOUS | Status: DC | PRN
  Administered 2024-01-20: 100 mL

## 2024-01-20 MED ORDER — SUGAMMADEX SODIUM 200 MG/2ML IV SOLN
INTRAVENOUS | Status: DC | PRN
  Administered 2024-01-20: 200 mg via INTRAVENOUS

## 2024-01-20 MED ORDER — ONDANSETRON HCL 4 MG/2ML IJ SOLN
INTRAMUSCULAR | Status: DC | PRN
  Administered 2024-01-20: 4 mg via INTRAVENOUS

## 2024-01-20 MED ORDER — FENTANYL CITRATE (PF) 250 MCG/5ML IJ SOLN
INTRAMUSCULAR | Status: AC
  Filled 2024-01-20: qty 5

## 2024-01-20 MED ORDER — PANTOPRAZOLE SODIUM 40 MG PO TBEC
40.0000 mg | DELAYED_RELEASE_TABLET | Freq: Every day | ORAL | Status: DC
  Administered 2024-01-21: 40 mg via ORAL
  Filled 2024-01-20: qty 1

## 2024-01-20 MED ORDER — DEXMEDETOMIDINE HCL IN NACL 80 MCG/20ML IV SOLN
INTRAVENOUS | Status: DC | PRN
  Administered 2024-01-20 (×5): 8 ug via INTRAVENOUS

## 2024-01-20 MED ORDER — DICYCLOMINE HCL 20 MG PO TABS: 20.0000 mg | ORAL_TABLET | Freq: Four times a day (QID) | ORAL | Status: DC | PRN

## 2024-01-20 MED ORDER — OXYCODONE HCL 5 MG/5ML PO SOLN: 5.0000 mg | Freq: Once | ORAL | Status: DC | PRN

## 2024-01-20 MED ORDER — ALBUTEROL SULFATE (2.5 MG/3ML) 0.083% IN NEBU: 2.5000 mg | INHALATION_SOLUTION | RESPIRATORY_TRACT | Status: DC | PRN

## 2024-01-20 MED ORDER — ACETAMINOPHEN 500 MG PO TABS
1000.0000 mg | ORAL_TABLET | Freq: Four times a day (QID) | ORAL | Status: DC
  Administered 2024-01-20 – 2024-01-21 (×4): 1000 mg via ORAL
  Filled 2024-01-20 (×4): qty 2

## 2024-01-20 MED ORDER — VENLAFAXINE HCL ER 75 MG PO CP24
75.0000 mg | ORAL_CAPSULE | Freq: Every day | ORAL | Status: DC
  Administered 2024-01-20: 75 mg via ORAL
  Filled 2024-01-20 (×2): qty 1

## 2024-01-20 MED ORDER — KETOROLAC TROMETHAMINE 15 MG/ML IJ SOLN
15.0000 mg | Freq: Four times a day (QID) | INTRAMUSCULAR | Status: DC
  Administered 2024-01-20 – 2024-01-21 (×4): 15 mg via INTRAVENOUS
  Filled 2024-01-20 (×4): qty 1

## 2024-01-20 MED ORDER — OXYCODONE HCL 5 MG PO TABS
ORAL_TABLET | ORAL | Status: AC
  Filled 2024-01-20: qty 2

## 2024-01-20 MED ORDER — KETOROLAC TROMETHAMINE 30 MG/ML IJ SOLN
INTRAMUSCULAR | Status: AC
  Filled 2024-01-20: qty 1

## 2024-01-20 MED ORDER — TRAZODONE HCL 50 MG PO TABS
100.0000 mg | ORAL_TABLET | Freq: Every day | ORAL | Status: DC
  Administered 2024-01-20: 100 mg via ORAL
  Filled 2024-01-20: qty 2

## 2024-01-20 MED ORDER — ROCURONIUM BROMIDE 10 MG/ML (PF) SYRINGE
PREFILLED_SYRINGE | INTRAVENOUS | Status: DC | PRN
  Administered 2024-01-20: 10 mg via INTRAVENOUS
  Administered 2024-01-20: 60 mg via INTRAVENOUS

## 2024-01-20 MED ORDER — LIDOCAINE 2% (20 MG/ML) 5 ML SYRINGE
INTRAMUSCULAR | Status: AC
  Filled 2024-01-20: qty 5

## 2024-01-20 MED ORDER — METOPROLOL SUCCINATE ER 25 MG PO TB24
25.0000 mg | ORAL_TABLET | Freq: Every day | ORAL | Status: DC
  Administered 2024-01-21: 25 mg via ORAL
  Filled 2024-01-20: qty 1

## 2024-01-20 MED ORDER — DILTIAZEM HCL ER COATED BEADS 180 MG PO CP24: 180.0000 mg | ORAL_CAPSULE | Freq: Every day | ORAL | Status: DC

## 2024-01-20 MED ORDER — BUPIVACAINE LIPOSOME 1.3 % IJ SUSP
INTRAMUSCULAR | Status: AC
  Filled 2024-01-20: qty 20

## 2024-01-20 MED ORDER — ORAL CARE MOUTH RINSE: 15.0000 mL | OROMUCOSAL | Status: DC | PRN

## 2024-01-20 MED ORDER — FENTANYL CITRATE (PF) 250 MCG/5ML IJ SOLN
INTRAMUSCULAR | Status: AC
Start: 2024-01-20 — End: 2024-01-20
  Filled 2024-01-20: qty 5

## 2024-01-20 MED ORDER — FENTANYL CITRATE (PF) 250 MCG/5ML IJ SOLN
INTRAMUSCULAR | Status: DC | PRN
  Administered 2024-01-20 (×2): 50 ug via INTRAVENOUS
  Administered 2024-01-20: 100 ug via INTRAVENOUS
  Administered 2024-01-20: 50 ug via INTRAVENOUS
  Administered 2024-01-20 (×2): 100 ug via INTRAVENOUS

## 2024-01-20 MED ORDER — CEFAZOLIN SODIUM-DEXTROSE 2-4 GM/100ML-% IV SOLN
2.0000 g | INTRAVENOUS | Status: AC
  Administered 2024-01-20: 2 g via INTRAVENOUS
  Filled 2024-01-20: qty 100

## 2024-01-20 MED ORDER — CEFAZOLIN SODIUM-DEXTROSE 2-4 GM/100ML-% IV SOLN
2.0000 g | Freq: Three times a day (TID) | INTRAVENOUS | Status: AC
  Administered 2024-01-20 (×2): 2 g via INTRAVENOUS
  Filled 2024-01-20 (×2): qty 100

## 2024-01-20 MED ORDER — ACETAMINOPHEN 10 MG/ML IV SOLN
INTRAVENOUS | Status: DC | PRN
Start: 2024-01-20 — End: 2024-01-20
  Administered 2024-01-20: 1000 mg via INTRAVENOUS

## 2024-01-20 SURGICAL SUPPLY — 52 items
BLADE STERNUM SYSTEM 6 (BLADE) IMPLANT
CATH THORACIC 28FR (CATHETERS) IMPLANT
CAUTERY SPATULA MNPLR 1.7 DVNC (INSTRUMENTS) IMPLANT
CNTNR URN SCR LID CUP LEK RST (MISCELLANEOUS) IMPLANT
DEFOGGER SCOPE WARM SEASHARP (MISCELLANEOUS) ×1 IMPLANT
DERMABOND ADVANCED .7 DNX12 (GAUZE/BANDAGES/DRESSINGS) ×1 IMPLANT
DRAIN CHANNEL 19F RND (DRAIN) IMPLANT
DRAPE ARM DVNC X/XI (DISPOSABLE) ×4 IMPLANT
DRAPE COLUMN DVNC XI (DISPOSABLE) ×1 IMPLANT
DRAPE CV SPLIT W-CLR ANES SCRN (DRAPES) ×1 IMPLANT
DRAPE SURG ORHT 6 SPLT 77X108 (DRAPES) ×1 IMPLANT
ELECTRODE REM PT RTRN 9FT ADLT (ELECTROSURGICAL) IMPLANT
EVACUATOR SILICONE 100CC (DRAIN) IMPLANT
FELT TEFLON 1X6 (MISCELLANEOUS) IMPLANT
FORCEPS BPLR LNG DVNC XI (INSTRUMENTS) IMPLANT
FORCEPS CADIERE DVNC XI (FORCEP) IMPLANT
GAUZE 4X4 16PLY ~~LOC~~+RFID DBL (SPONGE) IMPLANT
GAUZE KITTNER 4X5 RF (MISCELLANEOUS) IMPLANT
GAUZE SPONGE 4X4 12PLY STRL (GAUZE/BANDAGES/DRESSINGS) IMPLANT
GAUZE SPONGE 4X4 12PLY STRL LF (GAUZE/BANDAGES/DRESSINGS) IMPLANT
GLOVE BIO SURGEON STRL SZ7 (GLOVE) ×2 IMPLANT
GOWN STRL REUS W/ TWL LRG LVL3 (GOWN DISPOSABLE) ×1 IMPLANT
GOWN STRL REUS W/ TWL XL LVL3 (GOWN DISPOSABLE) ×2 IMPLANT
GOWN STRL REUS W/TWL 2XL LVL3 (GOWN DISPOSABLE) ×1 IMPLANT
GRASPER TIP-UP FEN DVNC XI (INSTRUMENTS) IMPLANT
NDL HYPO 22X1.5 SAFETY MO (MISCELLANEOUS) ×1 IMPLANT
NEEDLE HYPO 22X1.5 SAFETY MO (MISCELLANEOUS) ×1 IMPLANT
PACK CHEST (CUSTOM PROCEDURE TRAY) ×1 IMPLANT
PAD ARMBOARD POSITIONER FOAM (MISCELLANEOUS) ×2 IMPLANT
PAD ELECT DEFIB RADIOL ZOLL (MISCELLANEOUS) ×1 IMPLANT
SEAL UNIV 5-12 XI (MISCELLANEOUS) ×3 IMPLANT
SET TRI-LUMEN FLTR TB AIRSEAL (TUBING) ×1 IMPLANT
SPONGE T-LAP 18X18 ~~LOC~~+RFID (SPONGE) ×1 IMPLANT
STOPCOCK 4 WAY LG BORE MALE ST (IV SETS) IMPLANT
SUT MNCRL AB 3-0 PS2 18 (SUTURE) IMPLANT
SUT PDS AB 1 CTX 36 (SUTURE) IMPLANT
SUT SILK 1 MH (SUTURE) ×1 IMPLANT
SUT SILK 2 0 SH CR/8 (SUTURE) IMPLANT
SUT STEEL 6MS V (SUTURE) IMPLANT
SUT STEEL SZ 6 DBL 3X14 BALL (SUTURE) IMPLANT
SUT VIC AB 2-0 CT1 TAPERPNT 27 (SUTURE) IMPLANT
SUT VIC AB 2-0 CTX 36 (SUTURE) IMPLANT
SUT VIC AB 3-0 SH 27X BRD (SUTURE) ×1 IMPLANT
SUT VICRYL 0 UR6 27IN ABS (SUTURE) ×2 IMPLANT
SYR 50ML LL SCALE MARK (SYRINGE) IMPLANT
SYSTEM RETRIEVAL ANCHOR 8 (MISCELLANEOUS) IMPLANT
SYSTEM SAHARA CHEST DRAIN ATS (WOUND CARE) IMPLANT
TAPE CLOTH 4X10 WHT NS (GAUZE/BANDAGES/DRESSINGS) IMPLANT
TOWEL GREEN STERILE (TOWEL DISPOSABLE) IMPLANT
TRAP SPECIMEN MUCUS 40CC (MISCELLANEOUS) ×2 IMPLANT
TRAY FOLEY SLVR 16FR TEMP STAT (SET/KITS/TRAYS/PACK) ×1 IMPLANT
TUBING EXTENTION W/L.L. (IV SETS) IMPLANT

## 2024-01-20 NOTE — Brief Op Note (Signed)
 01/20/2024  11:41 AM  PATIENT:  Powell HERO Kupfer  38 y.o. female  PRE-OPERATIVE DIAGNOSIS:  PERICARDIAL CYST  POST-OPERATIVE DIAGNOSIS:  * No post-op diagnosis entered *  PROCEDURE:  ROBOT-ASSISTED THORASCOPY WITH REMOVAL OF PERICARDIAL CYST (Left) -  BLOCK, NERVE, INTERCOSTAL (Left)  SURGEON:  Surgeons and Role:    * Shyrl Linnie KIDD, MD - Primary  PHYSICIAN ASSISTANT: Con Bend PA-C  ASSISTANTS: none   ANESTHESIA:   local and general  EBL:  Minimal  BLOOD ADMINISTERED:none  DRAINS: left mediastinal drain   LOCAL MEDICATIONS USED:  OTHER Exparel   SPECIMEN:  Source of Specimen:  Pericardial cyst  DISPOSITION OF SPECIMEN:  PATHOLOGY  COUNTS:  YES  TOURNIQUET:  * No tourniquets in log *  DICTATION: .Dragon Dictation  PLAN OF CARE: Admit to inpatient   PATIENT DISPOSITION:  PACU - hemodynamically stable.   Delay start of Pharmacological VTE agent (>24hrs) due to surgical blood loss or risk of bleeding: yes

## 2024-01-20 NOTE — Transfer of Care (Signed)
 Immediate Anesthesia Transfer of Care Note  Patient: [name]  Procedure(s) Performed: ROBOT-ASSISTED THORASCOPY WITH REMOVAL OF PERICARDIAL CYST (Left: Chest) BLOCK, NERVE, INTERCOSTAL (Left: Chest)  Patient Location: PACU  Anesthesia Type:General  Level of Consciousness: drowsy  Airway & Oxygen Therapy: Patient Spontanous Breathing and Patient connected to face mask oxygen  Post-op Assessment: Report given to RN and Post -op Vital signs reviewed and stable  Post vital signs: Reviewed and stable  Last Vitals:  Vitals Value Taken Time  BP 127/82 01/20/24 12:00  Temp    Pulse 109 01/20/24 12:00  Resp 21 01/20/24 12:00  SpO2 96 % 01/20/24 12:00  Vitals shown include unfiled device data.  Last Pain:  Vitals:   01/20/24 0823  TempSrc:   PainSc: 0-No pain         Complications: No notable events documented.

## 2024-01-20 NOTE — Hospital Course (Addendum)
 History of Present Illness:    [name] is a 38 y.o. female who presents for surgical evaluation of a cystic anterior mediastinal lesion. This was found incidentally when she was being worked up for shortness of breath. She has had a thorough evaluation which has ruled out any pulmonary or cardiac etiology. She no longer smokes but continues to be short of breath and have some chest pain. CT chest 10/08/23 showed a 2.3cm enlarged prevascular lymph node that had moderately increased in size from 1.2cm over the last 3 years. PET scan 10/28/23 showed an internal density of 6 Hounsfield units demonstrating lack of enhancement and SUV about 1.8.  Dr. Shyrl reviewed the patient's diagnostic studies and determined she would benefit from surgical intervention. He reviewed the patient's treatment options as well as the risks and benefits of surgery. [name] was agreeable to proceed with surgery.  Hospital Course: [name] presented to [facility] and was brought to the operating room on 01/20/24. She underwent robotic assisted pericardial cyst resection. She tolerated the procedure well and was transferred to the PACU in stable condition.

## 2024-01-20 NOTE — Anesthesia Preprocedure Evaluation (Signed)
 Anesthesia Evaluation  Patient identified by MRN, date of birth, ID band Patient awake    Reviewed: Allergy & Precautions, NPO status , Patient's Chart, lab work & pertinent test results, reviewed documented beta blocker date and time   History of Anesthesia Complications Negative for: history of anesthetic complications  Airway Mallampati: II       Dental  (+) Dental Advisory Given, Edentulous Upper   Pulmonary neg shortness of breath, asthma , neg sleep apnea, neg COPD, neg recent URI, former smoker   breath sounds clear to auscultation       Cardiovascular + dysrhythmias  Rhythm:Regular     Neuro/Psych  Headaches PSYCHIATRIC DISORDERS Anxiety Depression Bipolar Disorder   psuedoseizure    GI/Hepatic Neg liver ROS,GERD  Medicated and Controlled,,  Endo/Other  negative endocrine ROS    Renal/GU Lab Results      Component                Value               Date                      NA                       136                 01/16/2024                K                        4.1                 01/16/2024                CO2                      26                  01/16/2024                GLUCOSE                  101 (H)             01/16/2024                BUN                      6                   01/16/2024                CREATININE               0.71                01/16/2024                CALCIUM                  9.1                 01/16/2024                EGFR                     89  03/19/2023                GFRNONAA                 >60                 01/16/2024                Musculoskeletal negative musculoskeletal ROS (+)    Abdominal   Peds  Hematology negative hematology ROS (+) Lab Results      Component                Value               Date                      WBC                      12.8 (H)            01/16/2024                HGB                      13.2                 01/16/2024                HCT                      39.2                01/16/2024                MCV                      85.6                01/16/2024                PLT                      329                 01/16/2024              Anesthesia Other Findings   Reproductive/Obstetrics Lab Results      Component                Value               Date                      PREGTESTUR               NEGATIVE            03/23/2022             Hysterectomy 2021                              Anesthesia Physical Anesthesia Plan  ASA: 2  Anesthesia Plan: General   Post-op Pain Management: Ofirmev  IV (intra-op)* and Toradol  IV (intra-op)*   Induction: Intravenous  PONV Risk Score and Plan: Ondansetron , Dexamethasone  and Midazolam   Airway Management Planned: Double Lumen EBT  Additional Equipment: ClearSight  Intra-op Plan:  Post-operative Plan: Extubation in OR  Informed Consent: I have reviewed the patients History and Physical, chart, labs and discussed the procedure including the risks, benefits and alternatives for the proposed anesthesia with the patient or authorized representative who has indicated his/her understanding and acceptance.     Dental advisory given  Plan Discussed with: CRNA  Anesthesia Plan Comments:         Anesthesia Quick Evaluation

## 2024-01-20 NOTE — Anesthesia Procedure Notes (Signed)
 Procedure Name: Intubation Date/Time: 01/20/2024 10:13 AM  Performed by: Cindie Donald CROME, CRNAPre-anesthesia Checklist: Patient identified, Emergency Drugs available, Suction available and Patient being monitored Patient Re-evaluated:Patient Re-evaluated prior to induction Oxygen Delivery Method: Circle System Utilized Preoxygenation: Pre-oxygenation with 100% oxygen Induction Type: IV induction Ventilation: Mask ventilation without difficulty Laryngoscope Size: Mac and 3 Grade View: Grade I Tube type: Oral Endobronchial tube: Left, Double lumen EBT, EBT position confirmed by auscultation and EBT position confirmed by fiberoptic bronchoscope and 37 Fr Number of attempts: 1 Airway Equipment and Method: Stylet Placement Confirmation: ETT inserted through vocal cords under direct vision, positive ETCO2 and breath sounds checked- equal and bilateral Secured at: 29 cm Tube secured with: Tape Dental Injury: Teeth and Oropharynx as per pre-operative assessment

## 2024-01-20 NOTE — Interval H&P Note (Signed)
 History and Physical Interval Note:  01/20/2024 9:49 AM  [name]  has presented today for surgery, with the diagnosis of PERICARDIAL CYST.  The various methods of treatment have been discussed with the patient and family. After consideration of risks, benefits and other options for treatment, the patient has consented to  Procedure(s) with comments: CREATION, PERICARDIAL WINDOW, ROBOT-ASSISTED (Left) - LEFT ROBOTIC RESECTION OF PERICARDIAL CYST as a surgical intervention.  The patient's history has been reviewed, patient examined, no change in status, stable for surgery.  I have reviewed the patient's chart and labs.  Questions were answered to the patient's satisfaction.     [name]

## 2024-01-20 NOTE — Op Note (Signed)
      [address]       [place] [address]             [phone]        01/20/2024  Patient:  Powell HERO Hymas Pre-Op Dx: pericardial cyst   Post-op Dx:  same Procedure: - Robotic assisted left video thoracoscopy - Resection of pericardial cyst - Intercostal nerve block  Surgeon and Role:      * Shyrl Linnie KIDD, MD - Primary  Assistant: B. Raguel, PA-C  An experienced assistant was required given the complexity of this surgery and the standard of surgical care. The assistant was needed for exposure, dissection, suctioning, retraction of delicate tissues and sutures, instrument exchange and for overall help during this procedure.    Anesthesia  general EBL:  25 ml Blood Administration: none Specimen:  pericardial cyst  Drains: 28 F argyle chest tube in left chest Counts: correct   Indications: 38 year old female with a cystic lesion in her anterior mediastinum. I do not think that this is the cause of her symptoms however she is adamant that she wants this removed. 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 We discussed the risks and benefits of a left robotic assisted thoracoscopy with resection of the cystic lesion. She has agreed to proceed.   Findings: Pericardial cyst in the AP window.  It was directly under the phrenic nerve.  Care was taken to skeletonize it off of the nerve and in the AP window as well.    Operative Technique: After the risks, benefits and alternatives were thoroughly discussed, the patient was brought to the operative theatre.  Anesthesia was induced, and the patient was then placed in a lateral decubitus position and was prepped and draped in normal sterile fashion.  An appropriate surgical pause was performed, and pre-operative antibiotics were dosed accordingly.  We began by placing our 3 robotic ports in the the 7th intercostal space targeting the hilum of the lung.  A 12mm assistant port was placed in the  9th intercostal space in the anterior axillary line.  The robot was then docked and all instruments were passed under direct visualization.    The lung was then retracted posteriorly, and we began to dissect out the the cyst in the AP window.  Care was tken to insure that the phrenic nerve was not injured.  Once completely mobilized, it was passed into and endocatch bag and removed from the anterior port site.    An intercostal nerve block was performed under direct visualization.  A 28 F chest tube was then placed, and we watch the lung re-expand.  The skin and soft tissue were closed with absorbable suture    The patient tolerated the procedure without any immediate complications, and was transferred to the PACU in stable condition.  Wahneta Derocher KIDD Shyrl

## 2024-01-20 NOTE — Discharge Instructions (Signed)
 Discharge Instructions:  1. You may shower, please wash incisions daily with soap and water and keep dry.  If you wish to cover wounds with dressing you may do so but please keep clean and change daily.  No tub baths or swimming until incisions have completely healed.  If your incisions become red or develop any drainage please call our office at [phone]  2. No Driving until cleared by Dr. Lang office and you are no longer using narcotic pain medications  3. Fever of 101.5 for at least 24 hours with no source, please contact our office at [phone]  4. Activity- up as tolerated, please walk at least 3 times per day.  Avoid strenuous activity until cleared by Dr. Lang office.  5. If any questions or concerns arise, please do not hesitate to contact our office at [phone]

## 2024-01-21 ENCOUNTER — Inpatient Hospital Stay (HOSPITAL_COMMUNITY): Payer: MEDICAID

## 2024-01-21 ENCOUNTER — Encounter (HOSPITAL_COMMUNITY): Payer: Self-pay | Admitting: Thoracic Surgery (Cardiothoracic Vascular Surgery)

## 2024-01-21 DIAGNOSIS — Z9889 Other specified postprocedural states: Secondary | ICD-10-CM

## 2024-01-21 LAB — BASIC METABOLIC PANEL WITH GFR
Anion gap: 12 (ref 5–15)
BUN: 9 mg/dL (ref 6–20)
CO2: 21 mmol/L — ABNORMAL LOW (ref 22–32)
Calcium: 9 mg/dL (ref 8.9–10.3)
Chloride: 103 mmol/L (ref 98–111)
Creatinine, Ser: 0.78 mg/dL (ref 0.44–1.00)
GFR, Estimated: >60 mL/min (ref >60)
Glucose, Bld: 170 mg/dL — ABNORMAL HIGH (ref 70–99)
Potassium: 4.2 mmol/L (ref 3.5–5.1)
Sodium: 136 mmol/L (ref 135–145)

## 2024-01-21 LAB — CBC
HCT: 35.9 % — ABNORMAL LOW (ref 36.0–46.0)
Hemoglobin: 12.1 g/dL (ref 12.0–15.0)
MCH: 28.9 pg (ref 26.0–34.0)
MCHC: 33.7 g/dL (ref 30.0–36.0)
MCV: 85.9 fL (ref 80.0–100.0)
Platelets: 318 K/uL (ref 150–400)
RBC: 4.18 MIL/uL (ref 3.87–5.11)
RDW: 13.8 % (ref 11.5–15.5)
WBC: 12.1 K/uL — ABNORMAL HIGH (ref 4.0–10.5)
nRBC: 0 % (ref 0.0–0.2)

## 2024-01-21 LAB — SURGICAL PATHOLOGY

## 2024-01-21 MED ORDER — OXYCODONE HCL 5 MG PO TABS: 5.0000 mg | ORAL_TABLET | Freq: Four times a day (QID) | ORAL | 0 refills | Status: DC | PRN

## 2024-01-21 MED ORDER — ACETAMINOPHEN 325 MG PO TABS: 650.0000 mg | ORAL_TABLET | Freq: Four times a day (QID) | ORAL | Status: AC | PRN

## 2024-01-21 NOTE — Discharge Summary (Signed)
 [address] [place] [address]             [phone]        Physician Discharge Summary  Patient ID: [name] MRN: [id number] DOB/AGE: 02/15/1986 38 y.o.  Admit date: 01/20/2024 Discharge date: 01/21/2024  Admission Diagnoses:  Discharge Diagnoses:  Principal Problem:   Pericardial cyst   Discharged Condition: stable  History of Present Illness:    [name] is a 38 y.o. female who presents for surgical evaluation of a cystic anterior mediastinal lesion. This was found incidentally when she was being worked up for shortness of breath. She has had a thorough evaluation which has ruled out any pulmonary or cardiac etiology. She no longer smokes but continues to be short of breath and have some chest pain. CT chest 10/08/23 showed a 2.3cm enlarged prevascular lymph node that had moderately increased in size from 1.2cm over the last 3 years. PET scan 10/28/23 showed an internal density of 6 Hounsfield units demonstrating lack of enhancement and SUV about 1.8.  Dr. Shyrl reviewed the patient's diagnostic studies and determined she would benefit from surgical intervention. He reviewed the patient's treatment options as well as the risks and benefits of surgery. Ms. Whitis was agreeable to proceed with surgery.  Hospital Course: Ms. Kitchen presented to [facility] and was brought to the operating room on 01/20/24. She underwent robotic assisted pericardial cyst resection. She tolerated the procedure well and was transferred to the PACU in stable condition. She was sinus tachycardia, home Toprol  was restarted. Chest tube had minimal output and she had no air leak. Chest tube was removed and follow up CXR was stable with no pneumothorax, stable basilar atelectasis and no new infiltrates. She was ambulating well on room air. She was tolerating a diet. Pain improved once the chest tube was removed. She had 2 Tramadol  left at home from  her orthopedic or back pain. She was told to discontinue this while on Oxycodone . She was felt stable for discharge home.   Consults: None  Significant Diagnostic Studies: {diagnostics:18242}   Treatments: surgery: ***  PATHOLOGY: Pending  Discharge Exam: Blood pressure 126/82, pulse 96, temperature 97.6 F (36.4 C), temperature source Oral, resp. rate 16, height 5' 5 (1.651 m), weight 102.6 kg, last menstrual period 11/09/2019, SpO2 94%. General appearance: alert, cooperative, and no distress Neurologic: intact Heart: sinus tachycardia, no murmur Lungs: diminished left basilar breath sounds Abdomen: soft, non-tender; bowel sounds normal; no masses,  no organomegaly Extremities: extremities normal, atraumatic, no cyanosis or edema Wound: Clean and dry dressing in place  Disposition: Discharge disposition: 01-Home or Self Care        Allergies as of 01/21/2024       Reactions   Robitussin (alcohol Free) [guaifenesin] Anaphylaxis   Strawberry Extract Rash   CAN'T EVEN TOUCH STRAWBERRIES   Aspirin Other (See Comments)   Nose bleeds   Chocolate Flavoring Agent (non-screening) Rash   Codeine Rash        Medication List     STOP taking these medications    cyclobenzaprine  10 MG tablet Commonly known as: FLEXERIL    ivabradine  7.5 MG Tabs tablet Commonly known as: Corlanor   traMADol  50 MG tablet Commonly known as: ULTRAM    Vitamin D3 25 MCG (1000 UT) Caps       TAKE these medications    acetaminophen  325 MG tablet Commonly known as: Tylenol   Take 2 tablets (650 mg total) by mouth every 6 (six) hours as needed.   albuterol  108 (90 Base) MCG/ACT inhaler Commonly known as: VENTOLIN  HFA Inhale 2 puffs into the lungs every 4 (four) hours as needed for wheezing or shortness of breath.   busPIRone  15 MG tablet Commonly known as: BUSPAR  Take 15 mg by mouth 2 (two) times daily.   desonide  0.05 % ointment Commonly known as: DESOWEN  Apply 1 gram to the skin  on both elbows daily as needed for dry skin/rash   dicyclomine  20 MG tablet Commonly known as: BENTYL  Take 1 tablet (20 mg total) by mouth 4 (four) times daily -  before meals and at bedtime. FOR IBS   diltiazem  180 MG 24 hr capsule Commonly known as: CARDIZEM  CD Take 180 mg by mouth daily.   ferrous sulfate  325 (65 FE) MG EC tablet Take 325 mg by mouth daily.   fluticasone -salmeterol 115-21 MCG/ACT inhaler Commonly known as: ADVAIR HFA Inhale 2 puffs into the lungs 2 (two) times daily.   folic acid  1 MG tablet Commonly known as: FOLVITE  Take 1 tablet (1 mg total) by mouth daily.   meloxicam  15 MG tablet Commonly known as: MOBIC  Take 1 tablet (15 mg total) by mouth daily.   metoprolol  succinate 25 MG 24 hr tablet Commonly known as: TOPROL -XL Take 25 mg by mouth daily.   multivitamin with minerals Tabs tablet Take 1 tablet by mouth daily.   nortriptyline  10 MG capsule Commonly known as: PAMELOR  Take 40 mg by mouth at bedtime.   omeprazole  40 MG capsule Commonly known as: PRILOSEC TAKE 1 CAPSULE BY MOUTH ONCE DAILY FOR  HEARTBURN   oxyCODONE  5 MG immediate release tablet Commonly known as: Oxy IR/ROXICODONE  Take 1 tablet (5 mg total) by mouth every 6 (six) hours as needed for severe pain (pain score 7-10).   promethazine  12.5 MG tablet Commonly known as: PHENERGAN  Take 1 tablet (12.5 mg total) by mouth every 6 (six) hours as needed for nausea or vomiting.   traZODone  100 MG tablet Commonly known as: DESYREL  Take 100 mg by mouth at bedtime.   venlafaxine  XR 75 MG 24 hr capsule Commonly known as: EFFEXOR -XR Take 75 mg by mouth at bedtime.        Follow-up Information     Rutha Manuelita HERO, PA-C Follow up on 02/07/2024.   Specialties: Physician Assistant, Thoracic Surgery Why: Appointment is at 1:00PM, please get a CXR 1 hour prior to your appointment on the 2nd floor of our building Contact information: [address], [place]  [address] [phone]                 Signed: Con GORMAN Bend, PA-C  01/21/2024, 1:35 PM

## 2024-01-21 NOTE — Progress Notes (Signed)
Chest tube removed per order. Patient tolerated well.  

## 2024-01-21 NOTE — Progress Notes (Addendum)
      [address] [place] [address]             [phone]      1 Day Post-Op Procedure(s) (LRB): ROBOT-ASSISTED THORASCOPY WITH REMOVAL OF PERICARDIAL CYST (Left) BLOCK, NERVE, INTERCOSTAL (Left) Subjective: Patient reports she is hurting over her left chest and is having difficulty taking a deep breath due to the pain  Objective: Vital signs in last 24 hours: Temp:  [97.4 F (36.3 C)-98.1 F (36.7 C)] 97.9 F (36.6 C) (07/29 0315) Pulse Rate:  [99-107] 103 (07/29 0315) Cardiac Rhythm: Sinus tachycardia;Heart block (07/28 1900) Resp:  [15-31] 17 (07/29 0315) BP: (102-129)/(63-90) 123/68 (07/29 0315) SpO2:  [90 %-97 %] 93 % (07/29 0315) Weight:  [101.2 kg-102.6 kg] 102.6 kg (07/28 1707)  Hemodynamic parameters for last 24 hours:    Intake/Output from previous day: 07/28 0701 - 07/29 0700 In: 1720 [P.O.:720; I.V.:800; IV Piggyback:200] Out: 314 [Urine:300; Blood:10; Chest Tube:4] Intake/Output this shift: No intake/output data recorded.  General appearance: alert, cooperative, and no distress Neurologic: intact Heart: sinus tachycardia, no murmur Lungs: diminished left basilar breath sounds Abdomen: soft, non-tender; bowel sounds normal; no masses,  no organomegaly Extremities: extremities normal, atraumatic, no cyanosis or edema Wound: Clean and dry dressing in place  Lab Results: Recent Labs    01/21/24 0322  WBC 12.1*  HGB 12.1  HCT 35.9*  PLT 318   BMET:  Recent Labs    01/21/24 0322  NA 136  K 4.2  CL 103  CO2 21*  GLUCOSE 170*  BUN 9  CREATININE 0.78  CALCIUM 9.0    PT/INR: No results for input(s): LABPROT, INR in the last 72 hours. ABG No results found for: PHART, HCO3, TCO2, ACIDBASEDEF, O2SAT CBG (last 3)  No results for input(s): GLUCAP in the last 72 hours.  Assessment/Plan: S/P Procedure(s) (LRB): ROBOT-ASSISTED THORASCOPY WITH REMOVAL OF PERICARDIAL CYST (Left) BLOCK, NERVE, INTERCOSTAL  (Left)  Neuro: Anxiety/Bipolar 2 disorder, on home Buspar , Pamelor , Desyrel , and Effexor . Reports pain this AM. Continue current pain regimen. Pain should improve once tube is out.   CV: ST, HR 100-108. BP 123/68, stable. Restart home Toprol  XL 25mg  daily, will hold Cardizem  180mg  for today since BP is on the softer side. Mediastinal drain in place to water seal with 4cc of drainage recorded. No air leak.   Pulm: Saturating well on RA. CXR with left basilar atelectasis, no pneumothorax. Encourage IS and ambulation.  GI: Not yet passing gas, no nausea. Tolerating a diet.   Renal: Cr 0.78, stable. UO 300cc/24hrs recorded.   ID: Likely reactive leukocytosis, WBC 12.1. Afebrile.  Expected postop ABLA: H/H 12.1/35.9, stable. Not clinically significant at this time.   DVT Prophylaxis: Lovenox   Dispo: Plan to d/c drain, likely home later today vs tomorrow.    LOS: 1 day    Con GORMAN Bend, PA-C 01/21/2024   Agree Will remove CT Home today  Brenee Gajda O Shresta Risden

## 2024-01-21 NOTE — Plan of Care (Signed)
  Problem: Education: Goal: Knowledge of General Education information will improve Description: Including pain rating scale, medication(s)/side effects and non-pharmacologic comfort measures Outcome: Progressing   Problem: Coping: Goal: Level of anxiety will decrease Outcome: Progressing   Problem: Pain Managment: Goal: General experience of comfort will improve and/or be controlled Outcome: Progressing   Problem: Skin Integrity: Goal: Risk for impaired skin integrity will decrease Outcome: Progressing

## 2024-01-21 NOTE — Progress Notes (Signed)
 Reviewed AVS, patient expressed understanding of medications, MD follow up reviewed.   Removed IV, Site clean, dry and intact.  Patient states all belongings brought to the hospital at time of admission are accounted for and packed to take home.  Patient informed and expressed understanding of where to pick up discharge medications.  Pt transported to entrance A where family member was waiting in vehicle to transport home.

## 2024-01-21 NOTE — TOC CM/SW Note (Signed)
 Transition of Care [facility]) - Inpatient Brief Assessment   Patient Details  Name: [name] MRN: [id number] Date of Birth: 03-11-1986  [facility]) CM/SW Contact:    Lauraine FORBES Saa, LCSW Phone Number: 01/21/2024, 9:24 AM   Clinical Narrative:  9:24 AM Per chart review, patient resides at home with spouse and child(ren). Patient has a PCP and insurance. Patient does not have SNF/HH/DME history. Patient's preferred pharmacy's are [facility] and [facility] [address] No TOC needs were identified at this time. TOC will continue to follow and be available to assist.  Transition of Care Asessment: Insurance and Status: Insurance coverage has been reviewed Patient has primary care physician: Yes Home environment has been reviewed: Private Residence Prior level of function:: N/A Prior/Current Home Services: No current home services Social Drivers of Health Review: SDOH reviewed no interventions necessary Readmission risk has been reviewed: Yes Transition of care needs: no transition of care needs at this time

## 2024-01-21 NOTE — Anesthesia Postprocedure Evaluation (Signed)
 Anesthesia Post Note  Patient: Carren Blakley Rylee  Procedure(s) Performed: ROBOT-ASSISTED THORASCOPY WITH REMOVAL OF PERICARDIAL CYST (Left: Chest) BLOCK, NERVE, INTERCOSTAL (Left: Chest)     Patient location during evaluation: PACU Anesthesia Type: General Level of consciousness: awake and alert Pain management: pain level controlled Vital Signs Assessment: post-procedure vital signs reviewed and stable Respiratory status: spontaneous breathing, nonlabored ventilation and respiratory function stable Cardiovascular status: blood pressure returned to baseline and stable Postop Assessment: no apparent nausea or vomiting Anesthetic complications: no   No notable events documented.                Marcos Ruelas

## 2024-01-27 ENCOUNTER — Encounter: Payer: Self-pay | Admitting: *Deleted

## 2024-01-27 ENCOUNTER — Telehealth: Payer: Self-pay | Admitting: *Deleted

## 2024-01-27 ENCOUNTER — Other Ambulatory Visit: Payer: Self-pay

## 2024-01-27 ENCOUNTER — Emergency Department: Payer: MEDICAID

## 2024-01-27 ENCOUNTER — Observation Stay
Admission: EM | Admit: 2024-01-27 | Discharge: 2024-01-29 | Disposition: A | Discharge status: Home / Self Care | Payer: MEDICAID | Attending: Osteopathic Medicine | Admitting: Osteopathic Medicine

## 2024-01-27 DIAGNOSIS — R079 Chest pain, unspecified: Secondary | ICD-10-CM | POA: Diagnosis not present

## 2024-01-27 DIAGNOSIS — Z6838 Body mass index (BMI) 38.0-38.9, adult: Secondary | ICD-10-CM | POA: Diagnosis not present

## 2024-01-27 DIAGNOSIS — F3181 Bipolar II disorder: Secondary | ICD-10-CM | POA: Diagnosis not present

## 2024-01-27 DIAGNOSIS — J45909 Unspecified asthma, uncomplicated: Secondary | ICD-10-CM | POA: Diagnosis not present

## 2024-01-27 DIAGNOSIS — J189 Pneumonia, unspecified organism: Secondary | ICD-10-CM | POA: Diagnosis not present

## 2024-01-27 DIAGNOSIS — R0602 Shortness of breath: Secondary | ICD-10-CM

## 2024-01-27 DIAGNOSIS — R0789 Other chest pain: Secondary | ICD-10-CM | POA: Diagnosis not present

## 2024-01-27 DIAGNOSIS — E66812 Obesity, class 2: Secondary | ICD-10-CM | POA: Insufficient documentation

## 2024-01-27 DIAGNOSIS — Z79899 Other long term (current) drug therapy: Secondary | ICD-10-CM

## 2024-01-27 DIAGNOSIS — R0603 Acute respiratory distress: Secondary | ICD-10-CM | POA: Diagnosis not present

## 2024-01-27 DIAGNOSIS — U071 COVID-19: Secondary | ICD-10-CM | POA: Diagnosis not present

## 2024-01-27 DIAGNOSIS — R7303 Prediabetes: Secondary | ICD-10-CM

## 2024-01-27 DIAGNOSIS — J9811 Atelectasis: Secondary | ICD-10-CM | POA: Diagnosis not present

## 2024-01-27 DIAGNOSIS — I318 Other specified diseases of pericardium: Secondary | ICD-10-CM | POA: Diagnosis not present

## 2024-01-27 DIAGNOSIS — Q248 Other specified congenital malformations of heart: Secondary | ICD-10-CM

## 2024-01-27 LAB — BASIC METABOLIC PANEL WITH GFR
Anion gap: 11 (ref 5–15)
BUN: 12 mg/dL (ref 6–20)
CO2: 25 mmol/L (ref 22–32)
Calcium: 9.2 mg/dL (ref 8.9–10.3)
Chloride: 104 mmol/L (ref 98–111)
Creatinine, Ser: 0.72 mg/dL (ref 0.44–1.00)
GFR, Estimated: >60 mL/min (ref >60)
Glucose, Bld: 136 mg/dL — ABNORMAL HIGH (ref 70–99)
Potassium: 4.4 mmol/L (ref 3.5–5.1)
Sodium: 140 mmol/L (ref 135–145)

## 2024-01-27 LAB — CBC
HCT: 37.2 % (ref 36.0–46.0)
Hemoglobin: 12.3 g/dL (ref 12.0–15.0)
MCH: 28.9 pg (ref 26.0–34.0)
MCHC: 33.1 g/dL (ref 30.0–36.0)
MCV: 87.5 fL (ref 80.0–100.0)
Platelets: 368 K/uL (ref 150–400)
RBC: 4.25 MIL/uL (ref 3.87–5.11)
RDW: 14 % (ref 11.5–15.5)
WBC: 13.3 K/uL — ABNORMAL HIGH (ref 4.0–10.5)
nRBC: 0 % (ref 0.0–0.2)

## 2024-01-27 LAB — TROPONIN I (HIGH SENSITIVITY)
Troponin I (High Sensitivity): <2 ng/L (ref <18)
Troponin I (High Sensitivity): <2 ng/L (ref <18)

## 2024-01-27 LAB — RESP PANEL BY RT-PCR (RSV, FLU A&B, COVID)  RVPGX2
Influenza A by PCR: NEGATIVE
Influenza B by PCR: NEGATIVE
Resp Syncytial Virus by PCR: NEGATIVE
SARS Coronavirus 2 by RT PCR: POSITIVE — AB

## 2024-01-27 LAB — PROCALCITONIN: Procalcitonin: <0.1 ng/mL

## 2024-01-27 LAB — BRAIN NATRIURETIC PEPTIDE: B Natriuretic Peptide: 6.6 pg/mL (ref 0.0–100.0)

## 2024-01-27 MED ORDER — ENOXAPARIN SODIUM 60 MG/0.6ML IJ SOSY
0.5000 mg/kg | PREFILLED_SYRINGE | INTRAMUSCULAR | Status: DC
  Administered 2024-01-27 – 2024-01-28 (×2): 50 mg via SUBCUTANEOUS
  Filled 2024-01-27 (×2): qty 0.6

## 2024-01-27 MED ORDER — HYDROCODONE-ACETAMINOPHEN 5-325 MG PO TABS
1.0000 | ORAL_TABLET | ORAL | Status: DC | PRN
  Administered 2024-01-27 – 2024-01-29 (×6): 2 via ORAL
  Filled 2024-01-27 (×6): qty 2

## 2024-01-27 MED ORDER — IOHEXOL 350 MG/ML SOLN
75.0000 mL | Freq: Once | INTRAVENOUS | Status: AC | PRN
  Administered 2024-01-27: 75 mL via INTRAVENOUS

## 2024-01-27 MED ORDER — SODIUM CHLORIDE 0.9 % IV SOLN
500.0000 mg | Freq: Once | INTRAVENOUS | Status: AC
  Administered 2024-01-27: 500 mg via INTRAVENOUS
  Filled 2024-01-27: qty 5

## 2024-01-27 MED ORDER — SODIUM CHLORIDE 0.9 % IV SOLN: 500.0000 mg | INTRAVENOUS | Status: DC

## 2024-01-27 MED ORDER — ONDANSETRON HCL 4 MG PO TABS: 4.0000 mg | ORAL_TABLET | Freq: Four times a day (QID) | ORAL | Status: DC | PRN

## 2024-01-27 MED ORDER — SODIUM CHLORIDE 0.9 % IV SOLN
1.0000 g | Freq: Once | INTRAVENOUS | Status: AC
  Administered 2024-01-27: 1 g via INTRAVENOUS
  Filled 2024-01-27: qty 10

## 2024-01-27 MED ORDER — ACETAMINOPHEN 650 MG RE SUPP: 650.0000 mg | Freq: Four times a day (QID) | RECTAL | Status: DC | PRN

## 2024-01-27 MED ORDER — KETOROLAC TROMETHAMINE 30 MG/ML IJ SOLN
30.0000 mg | Freq: Four times a day (QID) | INTRAMUSCULAR | Status: DC | PRN
  Administered 2024-01-28: 30 mg via INTRAVENOUS
  Filled 2024-01-27: qty 1

## 2024-01-27 MED ORDER — ACETAMINOPHEN 325 MG PO TABS: 650.0000 mg | ORAL_TABLET | Freq: Four times a day (QID) | ORAL | Status: DC | PRN

## 2024-01-27 MED ORDER — ALBUTEROL SULFATE (2.5 MG/3ML) 0.083% IN NEBU: 2.5000 mg | INHALATION_SOLUTION | Freq: Four times a day (QID) | RESPIRATORY_TRACT | Status: DC | PRN

## 2024-01-27 MED ORDER — SODIUM CHLORIDE 0.9 % IV SOLN: 2.0000 g | INTRAVENOUS | Status: DC

## 2024-01-27 MED ORDER — GUAIFENESIN-DM 100-10 MG/5ML PO SYRP: 10.0000 mL | ORAL_SOLUTION | ORAL | Status: DC | PRN

## 2024-01-27 MED ORDER — ONDANSETRON HCL 4 MG/2ML IJ SOLN
4.0000 mg | Freq: Four times a day (QID) | INTRAMUSCULAR | Status: DC | PRN
  Administered 2024-01-28: 4 mg via INTRAVENOUS
  Filled 2024-01-27: qty 2

## 2024-01-27 MED ORDER — FENTANYL CITRATE PF 50 MCG/ML IJ SOSY
50.0000 ug | PREFILLED_SYRINGE | Freq: Once | INTRAMUSCULAR | Status: AC
  Administered 2024-01-27: 50 ug via INTRAVENOUS
  Filled 2024-01-27: qty 1

## 2024-01-27 NOTE — Telephone Encounter (Signed)
 Patient contacted the office stating she is having a hard time breathing since surgery. States she has been SOB at rest since hospital discharge. States her SOB has not changed for better or worse. States she is using her IS and getting up to 1650. Also c/o a swollen lump under her breast near an incision. Denies drainage, redness, or fevers. States medications are not helping with the pain. Advised patient to seek care via ED if she feels like she can not breath or catch her breath. Patient verbalized understanding.

## 2024-01-27 NOTE — ED Notes (Signed)
 Patient placed on 2L [place] per VO Bradler, MD for patient comfort. Patient provided with meal tray at request and provided with drink at okay from physician. VSS, CCM in use, call light within reach, family at bedside. No other needs identified at this time.

## 2024-01-27 NOTE — Assessment & Plan Note (Signed)
 Pericardial cyst s/p thoracoscopy 12/31/2023 Suspect noncardiac pain related to procedure Had recent negative stress test and normal echo prior to procedure to evaluate chest pain Multimodal pain control

## 2024-01-27 NOTE — Assessment & Plan Note (Signed)
 Continue home meds

## 2024-01-27 NOTE — Assessment & Plan Note (Signed)
 Albuterol as needed

## 2024-01-27 NOTE — Assessment & Plan Note (Addendum)
 COVID-19 infection Atelectasis left lower lobe s/p thoracoscopy 01/20/2024 Will treat empirically with Rocephin  and azithromycin  Guaifenesin  and incentive spirometer Albuterol  HFA f needed Patient without hypoxia  so no indication for steroids. Can consider oral antivirals-no longer on formulary COVID precautions We will get procalcitonin --> less than 0.01-so will DC antibiotics

## 2024-01-27 NOTE — H&P (Incomplete)
 History and Physical    Patient: [name] FMW:[id number] DOB: 1985/07/30 DOA: 01/27/2024 DOS: the patient was seen and examined on 01/27/2024 PCP: Liana Fish, NP  Patient coming from: Home  Chief Complaint:  Chief Complaint  Patient presents with   Post-op Problem   HPI: [name] is a 38 y.o. female with medical history significant of Bipolar and asthma who is s/p laparoscopic removal of a pericardial cyst via thoracoscopy on 7/28 by cardiothoracic surgery at [facility] being admitted with   COVID infection with left-sided lung collapse/atelectasis seen on chest x-ray.  She presented with persistent left-sided chest pain under her left breast and around to the back where she had the procedure.  She said the pain has been present since the day she had the procedure and does not seem to be going away.  It is constant and hurts to move and is severe in intensity.  She subsequently developed shortness of breath but has a known exposure to her mother who is also COVID-positive.  She denied fever or chills.  Denied pain or swelling in the legs. Of note, prior to her procedure patient had chest pain workup with a negative stress test and normal echo. In the ED tachycardic to 114 and mildly tachypneic to 22 but with O2 sat in the high 90s on room air.  Afebrile and normal blood pressures. Labs notable for WBC 13,000.  Lactic acid not done.  COVID-positive.  Troponin<2 and BNP 6.6.  BMP normal.EKG showed sinus tachycardia at 106. CTA PE protocol negative for PE but showing: New collapse/consolidation in the left lower lobe is likely due to atelectasis, related to recent surgery. The possibility of pneumonia is not excluded:SABRA  The ED provider: Spoke with cardiothoracic surgeon at [facility], Dr. Shyrl who stated that the CT finding was normal and expected.  And recommended incentive spirometry. Patient treated with fentanyl  for pain, started on Rocephin  and  azithromycin . Admission requested.    Past Medical History:  Diagnosis Date   Allergy    Anxiety    Asthma 11/24/2019   blood allergy testing done 11/24/19 to see what causes it to flare up   Bipolar 1 disorder (HCC)    Bipolar disorder (HCC)    Bronchitis    Chronic cough    being worked up with Dr. Tamea   Depression    Dyspareunia in female 11/2019   Dysrhythmia    TACHY BUT NOT A PROBLEM   Ectopic pregnancy    had surgery   Family history of adverse reaction to anesthesia    mother had a hard time waking up   GERD (gastroesophageal reflux disease)    History of concussion 2016   Hyperlipidemia    Migraine    has an aura. happening frequently   MRSA (methicillin resistant Staphylococcus aureus)    was on chin.  many years ago   NSVD (normal spontaneous vaginal delivery)    x 2   Poor dentition    Pseudoseizures    related to anxiety   Scoliosis    Scoliosis    Past Surgical History:  Procedure Laterality Date   ABDOMINAL HYSTERECTOMY     CARPAL TUNNEL RELEASE  2015   left   CYSTOSCOPY  11/27/2019   Procedure: CYSTOSCOPY;  Surgeon: Arloa Lamar SQUIBB, MD;  Location: [facility] ORS;  Service: Gynecology;;   ECTOPIC PREGNANCY SURGERY  2011   HERNIA REPAIR  2010   INTERCOSTAL NERVE BLOCK Left 01/20/2024   Procedure:  BLOCK, NERVE, INTERCOSTAL;  Surgeon: Shyrl Linnie KIDD, MD;  Location: [facility] OR;  Service: Thoracic;  Laterality: Left;   TOTAL LAPAROSCOPIC HYSTERECTOMY WITH SALPINGECTOMY Bilateral 11/27/2019   Procedure: TOTAL LAPAROSCOPIC HYSTERECTOMY WITH SALPINGECTOMY;  Surgeon: Arloa Lamar SQUIBB, MD;  Location: [facility] ORS;  Service: Gynecology;  Laterality: Bilateral;  needs RNFA   TUBAL LIGATION     XI ROBOTIC ASSISTED PERICARDIAL WINDOW Left 01/20/2024   Procedure: ROBOT-ASSISTED THORASCOPY WITH REMOVAL OF PERICARDIAL CYST;  Surgeon: Shyrl Linnie KIDD, MD;  Location: [facility] OR;  Service: Thoracic;  Laterality: Left;  LEFT ROBOTIC RESECTION OF PERICARDIAL CYST   Social  History:  reports that she quit smoking about 4 years ago. Her smoking use included cigarettes. She started smoking about 4 years ago. She has a 15 pack-year smoking history. She has never used smokeless tobacco. She reports that she does not drink alcohol and does not use drugs.  Allergies  Allergen Reactions   Robitussin (Alcohol Free) [Guaifenesin ] Anaphylaxis   Strawberry Extract Rash    CAN'T EVEN TOUCH STRAWBERRIES   Aspirin Other (See Comments)    Nose bleeds   Chocolate Flavoring Agent (Non-Screening) Rash   Codeine Rash    Family History  Problem Relation Age of Onset   Asthma Mother    Heart disease Mother    Diabetes Mother    Hyperlipidemia Mother    Hypertension Mother    Mental illness Mother    Migraines Mother    Thyroid disease Mother    Stroke Mother    Alcohol abuse Mother    Bipolar disorder Mother    Anxiety disorder Mother    COPD Mother    Hypertension Sister    Depression Sister    Drug abuse Sister    Cancer Maternal Grandmother    Diabetes Maternal Grandmother    Heart disease Maternal Grandmother    Hyperlipidemia Maternal Grandmother    Hypertension Maternal Grandmother    Kidney disease Maternal Grandmother    Heart disease Paternal Grandfather    Alcohol abuse Paternal Grandfather    Drug abuse Paternal Grandfather     Prior to Admission medications   Medication Sig Start Date End Date Taking? Authorizing Provider  acetaminophen  (TYLENOL ) 325 MG tablet Take 2 tablets (650 mg total) by mouth every 6 (six) hours as needed. 01/21/24   Raguel Con RAMAN, PA-C  albuterol  (VENTOLIN  HFA) 108 [phone] Base) MCG/ACT inhaler Inhale 2 puffs into the lungs every 4 (four) hours as needed for wheezing or shortness of breath. 03/23/22   Lang Dover, MD  busPIRone  (BUSPAR ) 15 MG tablet Take 15 mg by mouth 2 (two) times daily.    [provider]  desonide  (DESOWEN ) 0.05 % ointment Apply 1 gram to the skin on both elbows daily as needed for dry  skin/rash 09/18/23   Liana Fish, NP  dicyclomine  (BENTYL ) 20 MG tablet Take 1 tablet (20 mg total) by mouth 4 (four) times daily -  before meals and at bedtime. FOR IBS 07/10/23   Liana Fish, NP  diltiazem  (CARDIZEM  CD) 180 MG 24 hr capsule Take 180 mg by mouth daily.    [provider]  ferrous sulfate  325 (65 FE) MG EC tablet Take 325 mg by mouth daily.    [provider]  fluticasone -salmeterol (ADVAIR HFA) 115-21 MCG/ACT inhaler Inhale 2 puffs into the lungs 2 (two) times daily. 07/24/23   Liana Fish, NP  folic acid  (FOLVITE ) 1 MG tablet Take 1 tablet (1 mg total) by mouth daily.  11/07/23   Liana Fish, NP  meloxicam  (MOBIC ) 15 MG tablet Take 1 tablet (15 mg total) by mouth daily. 11/07/23   Abernathy, Alyssa, NP  metoprolol  succinate (TOPROL -XL) 25 MG 24 hr tablet Take 25 mg by mouth daily. 12/11/23 12/10/24  [provider]  Multiple Vitamin (MULTIVITAMIN WITH MINERALS) TABS tablet Take 1 tablet by mouth daily.    [provider]  nortriptyline  (PAMELOR ) 10 MG capsule Take 40 mg by mouth at bedtime.    [provider]  omeprazole  (PRILOSEC) 40 MG capsule TAKE 1 CAPSULE BY MOUTH ONCE DAILY FOR  HEARTBURN 08/14/23   Abernathy, Alyssa, NP  oxyCODONE  (OXY IR/ROXICODONE ) 5 MG immediate release tablet Take 1 tablet (5 mg total) by mouth every 6 (six) hours as needed for severe pain (pain score 7-10). 01/21/24   Raguel Con RAMAN, PA-C  promethazine  (PHENERGAN ) 12.5 MG tablet Take 1 tablet (12.5 mg total) by mouth every 6 (six) hours as needed for nausea or vomiting. 12/05/23   Liana Fish, NP  traZODone  (DESYREL ) 100 MG tablet Take 100 mg by mouth at bedtime. 11/13/23   [provider]  venlafaxine  XR (EFFEXOR -XR) 75 MG 24 hr capsule Take 75 mg by mouth at bedtime. 08/13/23   [provider]    Physical Exam: Vitals:   01/27/24 1516 01/27/24 1730 01/27/24 1910 01/27/24 2033  BP: 112/87 123/70 115/66 125/82   Pulse: (!) 114 (!) 109 97 94  Resp: (!) 22 17 14 20   Temp: 98.4 F (36.9 C)   98 F (36.7 C)  TempSrc: Oral   Oral  SpO2: 98% 99% 99% 99%  Weight: 102.1 kg   105.7 kg  Height: 5' 5 (1.651 m)   5' 5 (1.651 m)   Physical Exam Vitals and nursing note reviewed.  Constitutional:      General: She is not in acute distress.    Comments: Patient noted with hand pressing against chest at lower anterolateral rib cage just under the left breast.  Appears to be in discomfort.   HENT:     Head: Normocephalic and atraumatic.  Cardiovascular:     Rate and Rhythm: Regular rhythm. Tachycardia present.     Heart sounds: Normal heart sounds.  Pulmonary:     Effort: Tachypnea present.     Breath sounds: Normal breath sounds.     Comments: Taking shallow breaths Chest:     Comments: Surgical wound left posterolateral chest with no evidence of infection/inflammation Abdominal:     Palpations: Abdomen is soft.     Tenderness: There is no abdominal tenderness.  Neurological:     Mental Status: Mental status is at baseline.     Data Reviewed: Relevant notes from primary care and specialist visits, past discharge summaries as available in EHR, including Care Everywhere. Prior diagnostic testing as pertinent to current admission diagnoses Updated medications and problem lists for reconciliation ED course, including vitals, labs, imaging, treatment and response to treatment Triage notes, nursing and pharmacy notes and ED provider's notes Notable results as noted in HPI   Assessment and Plan: * Pneumonia COVID-19 infection Atelectasis left lower lobe s/p thoracoscopy 01/20/2024 Will treat empirically with Rocephin  and azithromycin  Guaifenesin  and incentive spirometer Albuterol  HFA f needed Patient without hypoxia  so no indication for steroids. Can consider oral antivirals-no longer on formulary COVID precautions We will get procalcitonin and DC antibiotics if negative   Left-sided  chest pain Pericardial cyst s/p thoracoscopy 12/31/2023 Suspect noncardiac pain related to procedure Had recent negative  stress test and normal echo prior to procedure to evaluate chest pain Multimodal pain control   Bipolar 2 disorder (HCC) Continue home meds  Asthma Albuterol  as needed    Advance Care Planning:   Code Status: Full Code   Consults: none  Family Communication:   Severity of Illness: The appropriate patient status for this patient is OBSERVATION. Observation status is judged to be reasonable and necessary in order to provide the required intensity of service to ensure the patient's safety. The patient's presenting symptoms, physical exam findings, and initial radiographic and laboratory data in the context of their medical condition is felt to place them at decreased risk for further clinical deterioration. Furthermore, it is anticipated that the patient will be medically stable for discharge from the hospital within 2 midnights of admission.   Author: Delayne LULLA Solian, MD 01/27/2024 8:51 PM  For on call review [email or web address].

## 2024-01-27 NOTE — ED Triage Notes (Addendum)
 Pt ambulatory to triage.  Pt reports having a pericardial cyst removed last at [facility].  Pt reports sob and pain beneath left breast.  Pt states it hurts to touch area beneath breast.  Pt also reports swelling beneath left breast.   Pt reports her mother has COVID and she has been with her since the surgery.   Pt alert.

## 2024-01-27 NOTE — ED Provider Notes (Signed)
 [facility] Provider Note   Event Date/Time   First MD Initiated Contact with Patient 01/27/24 1652     (approximate) History  Post-op Problem  HPI [name] is a 38 y.o. female with a past medical history of bipolar disorder, anxiety/depression, migraines, PNES, and asthma with recent robotic thoracentesis for removal of pericardial cyst on 01/20/2024 who presents complaining of worsening shortness of breath and uncontrolled chest pain.  Patient states that she is having pain with any breathing despite being on oxycodone  in the outpatient setting.  [name] is pain to the left lower anterior chest wall that is worse when taking a deep breath or with palpitation.  Patient also endorses cough that is nonproductive. ROS: Patient currently denies any vision changes, tinnitus, difficulty speaking, facial droop, sore throat, abdominal pain, nausea/vomiting/diarrhea, dysuria, or weakness/numbness/paresthesias in any extremity   Physical Exam  Triage Vital Signs: ED Triage Vitals [01/27/24 1516]  Encounter Vitals Group     BP 112/87     Girls Systolic BP Percentile      Girls Diastolic BP Percentile      Boys Systolic BP Percentile      Boys Diastolic BP Percentile      Pulse Rate (!) 114     Resp (!) 22     Temp 98.4 F (36.9 C)     Temp Source Oral     SpO2 98 %     Weight 225 lb (102.1 kg)     Height 5' 5 (1.651 m)     Head Circumference      Peak Flow      Pain Score 8     Pain Loc      Pain Education      Exclude from Growth Chart    Most recent vital signs: Vitals:   01/27/24 1730 01/27/24 1910  BP: 123/70 115/66  Pulse: (!) 109 97  Resp: 17 14  Temp:    SpO2: 99% 99%   General: Awake, oriented x4. CV:  Good peripheral perfusion.  No MGR Resp:  Stunted effort.  CTAB Abd:  No distention. Other:  Middle-aged obese Caucasian female resting in stretcher in moderate distress secondary to chest pain and shortness of breath distress ED  Results / Procedures / Treatments  Labs (all labs ordered are listed, but only abnormal results are displayed) Labs Reviewed  RESP PANEL BY RT-PCR (RSV, FLU A&B, COVID)  RVPGX2 - Abnormal; Notable for the following components:      Result Value   SARS Coronavirus 2 by RT PCR POSITIVE (*)    All other components within normal limits  BASIC METABOLIC PANEL WITH GFR - Abnormal; Notable for the following components:   Glucose, Bld 136 (*)    All other components within normal limits  CBC - Abnormal; Notable for the following components:   WBC 13.3 (*)    All other components within normal limits  BRAIN NATRIURETIC PEPTIDE  HIV ANTIBODY (ROUTINE TESTING W REFLEX)  BASIC METABOLIC PANEL WITH GFR  CBC  TROPONIN I (HIGH SENSITIVITY)  TROPONIN I (HIGH SENSITIVITY)   EKG ED ECG REPORT I, Artist MARLA Kerns, the attending physician, personally viewed and interpreted this ECG. Date: 01/27/2024 EKG Time: 1518 Rate: 106 Rhythm: Tachycardic sinus rhythm QRS Axis: normal Intervals: normal ST/T Wave abnormalities: normal Narrative Interpretation: Tachycardic sinus rhythm.  No evidence of acute ischemia RADIOLOGY ED MD interpretation: 2 view chest x-ray shows increased interstitial opacities which may represent pulmonary edema  or atypical infection.  There is a persistent hazy and linear left basilar opacity  - All radiology independently interpreted and agree with radiology assessment Official radiology report(s): CT Angio Chest PE W/Cm &/Or Wo Cm Result Date: 01/27/2024 CLINICAL DATA:  Shortness of breath and pain beneath the left breast. EXAM: CT ANGIOGRAPHY CHEST WITH CONTRAST TECHNIQUE: Multidetector CT imaging of the chest was performed using the standard protocol during bolus administration of intravenous contrast. Multiplanar CT image reconstructions and MIPs were obtained to evaluate the vascular anatomy. RADIATION DOSE REDUCTION: This exam was performed according to the departmental  dose-optimization program which includes automated exposure control, adjustment of the mA and/or kV according to patient size and/or use of iterative reconstruction technique. CONTRAST:  75mL OMNIPAQUE  IOHEXOL  350 MG/ML SOLN COMPARISON:  MR chest 11/13/2023, PET 10/28/2023, CT chest 10/08/2023. FINDINGS: Cardiovascular: Negative for pulmonary embolus. Heart is enlarged with left ventricular dilatation. No pericardial effusion. Mediastinum/Nodes: No pathologically enlarged mediastinal, hilar or axillary lymph nodes. Interval removal of previously seen prevascular pericardial cyst on the left when compared with 10/08/2023. Esophagus is grossly unremarkable. Lungs/Pleura: Lingular volume loss, adjacent to an elevated left hemidiaphragm. Additional collapse/consolidation in the left lower lobe, new from 12/19/2023. No pleural fluid. Airway is unremarkable. Upper Abdomen: Small right adrenal adenoma. No specific follow-up necessary. Visualized portions of the liver, gallbladder, adrenal glands, kidneys, spleen, pancreas, stomach and bowel are otherwise grossly unremarkable. No upper abdominal adenopathy. Musculoskeletal: None. Review of the MIP images confirms the above findings. IMPRESSION: 1. Negative for pulmonary embolus. 2. New collapse/consolidation in the left lower lobe is likely due to atelectasis, related to recent surgery. The possibility of pneumonia is not excluded. Electronically Signed   By: Newell Eke M.D.   On: 01/27/2024 18:57   DG Chest 2 View Result Date: 01/27/2024 CLINICAL DATA:  Shortness of breath status post pericardial cyst surgery EXAM: CHEST - 2 VIEW COMPARISON:  Chest radiograph dated 01/21/2024 FINDINGS: Normal lung volumes. Increased interstitial opacities. Persistent hazy and linear left basilar opacity. No pleural effusion or pneumothorax. The heart size and mediastinal contours are within normal limits. No acute osseous abnormality. IMPRESSION: 1. Increased interstitial opacities,  which may represent pulmonary edema or atypical infection. 2. Persistent hazy and linear left basilar opacity, likely atelectasis. Electronically Signed   By: Limin  Xu M.D.   On: 01/27/2024 15:51   PROCEDURES: Critical Care performed: No Procedures MEDICATIONS ORDERED IN ED: Medications  azithromycin  (ZITHROMAX ) 500 mg in sodium chloride  0.9 % 250 mL IVPB (has no administration in time range)  enoxaparin  (LOVENOX ) injection 50 mg (has no administration in time range)  cefTRIAXone  (ROCEPHIN ) 2 g in sodium chloride  0.9 % 100 mL IVPB (has no administration in time range)  azithromycin  (ZITHROMAX ) 500 mg in sodium chloride  0.9 % 250 mL IVPB (has no administration in time range)  acetaminophen  (TYLENOL ) tablet 650 mg (has no administration in time range)    Or  acetaminophen  (TYLENOL ) suppository 650 mg (has no administration in time range)  HYDROcodone -acetaminophen  (NORCO/VICODIN) 5-325 MG per tablet 1-2 tablet (has no administration in time range)  ketorolac  (TORADOL ) 30 MG/ML injection 30 mg (has no administration in time range)  ondansetron  (ZOFRAN ) tablet 4 mg (has no administration in time range)    Or  ondansetron  (ZOFRAN ) injection 4 mg (has no administration in time range)  guaiFENesin -dextromethorphan (ROBITUSSIN DM) 100-10 MG/5ML syrup 10 mL (has no administration in time range)  albuterol  (PROVENTIL ) (2.5 MG/3ML) 0.083% nebulizer solution 2.5 mg (has no administration in time range)  fentaNYL  (SUBLIMAZE ) injection 50 mcg (50 mcg Intravenous Given 01/27/24 1712)  iohexol  (OMNIPAQUE ) 350 MG/ML injection 75 mL (75 mLs Intravenous Contrast Given 01/27/24 1758)  cefTRIAXone  (ROCEPHIN ) 1 g in sodium chloride  0.9 % 100 mL IVPB (0 g Intravenous Stopped 01/27/24 2010)   IMPRESSION / MDM / ASSESSMENT AND PLAN / ED COURSE  I reviewed the triage vital signs and the nursing notes.                             The patient is on the cardiac monitor to evaluate for evidence of arrhythmia and/or  significant heart rate changes. Patient's presentation is most consistent with acute presentation with potential threat to life or bodily function. Presentation most consistent with Viral Syndrome as well as likely pain from post procedure.  Patient has tested positive for COVID-19. At this time patient is requiring submental oxygenation due to acute hypoxic respiratory failure.  Given History and Exam I have a lower suspicion for: Emergent CardioPulmonary causes [such as Acute Asthma or COPD Exacerbation, acute Heart Failure or exacerbation, PE, PTX, atypical ACS, PNA]. Emergent Otolaryngeal causes [such as PTA, RPA, Ludwigs, Epiglottitis, EBV].  Regarding Emergent Travel or Immunosuppressive related infectious: I have a low suspicion for acute HIV.  Given radiologic evidence for patchy bilateral airspace opacities concerning for viral pneumonia, continued need for supplemental oxygenation due to respiratory distress, and need for further evaluation and management, patient will require admission  Dispo: Admit to medicine   FINAL CLINICAL IMPRESSION(S) / ED DIAGNOSES   Final diagnoses:  Respiratory distress  Shortness of breath  Left-sided chest pain   Rx / DC Orders   ED Discharge Orders     None      Note:  This document was prepared using Dragon voice recognition software and may include unintentional dictation errors.   Jossie Artist POUR, MD 01/27/24 2013

## 2024-01-28 ENCOUNTER — Observation Stay: Payer: MEDICAID

## 2024-01-28 DIAGNOSIS — J9601 Acute respiratory failure with hypoxia: Secondary | ICD-10-CM | POA: Diagnosis not present

## 2024-01-28 LAB — BASIC METABOLIC PANEL WITH GFR
Anion gap: 8 (ref 5–15)
BUN: 13 mg/dL (ref 6–20)
CO2: 24 mmol/L (ref 22–32)
Calcium: 8.7 mg/dL — ABNORMAL LOW (ref 8.9–10.3)
Chloride: 106 mmol/L (ref 98–111)
Creatinine, Ser: 0.64 mg/dL (ref 0.44–1.00)
GFR, Estimated: >60 mL/min (ref >60)
Glucose, Bld: 114 mg/dL — ABNORMAL HIGH (ref 70–99)
Potassium: 4 mmol/L (ref 3.5–5.1)
Sodium: 138 mmol/L (ref 135–145)

## 2024-01-28 LAB — CBC
HCT: 35 % — ABNORMAL LOW (ref 36.0–46.0)
Hemoglobin: 11.3 g/dL — ABNORMAL LOW (ref 12.0–15.0)
MCH: 28.2 pg (ref 26.0–34.0)
MCHC: 32.3 g/dL (ref 30.0–36.0)
MCV: 87.3 fL (ref 80.0–100.0)
Platelets: 363 K/uL (ref 150–400)
RBC: 4.01 MIL/uL (ref 3.87–5.11)
RDW: 13.9 % (ref 11.5–15.5)
WBC: 11.1 K/uL — ABNORMAL HIGH (ref 4.0–10.5)
nRBC: 0 % (ref 0.0–0.2)

## 2024-01-28 LAB — HIV ANTIBODY (ROUTINE TESTING W REFLEX): HIV Screen 4th Generation wRfx: NONREACTIVE

## 2024-01-28 MED ORDER — IOHEXOL 9 MG/ML PO SOLN
500.0000 mL | Freq: Once | ORAL | Status: AC | PRN
Start: 2024-01-28 — End: 2024-01-28
  Administered 2024-01-28 (×2): 500 mL via ORAL

## 2024-01-28 MED ORDER — VENLAFAXINE HCL ER 75 MG PO CP24
75.0000 mg | ORAL_CAPSULE | Freq: Every day | ORAL | Status: DC
  Administered 2024-01-28: 75 mg via ORAL
  Filled 2024-01-28: qty 1

## 2024-01-28 MED ORDER — DILTIAZEM HCL ER COATED BEADS 180 MG PO CP24
180.0000 mg | ORAL_CAPSULE | Freq: Every day | ORAL | Status: DC
  Administered 2024-01-29: 180 mg via ORAL
  Filled 2024-01-28: qty 1

## 2024-01-28 MED ORDER — TRAZODONE HCL 50 MG PO TABS
100.0000 mg | ORAL_TABLET | Freq: Every day | ORAL | Status: DC
  Administered 2024-01-28: 100 mg via ORAL
  Filled 2024-01-28: qty 2

## 2024-01-28 MED ORDER — PANTOPRAZOLE SODIUM 40 MG PO TBEC
40.0000 mg | DELAYED_RELEASE_TABLET | Freq: Every day | ORAL | Status: DC
  Administered 2024-01-29: 40 mg via ORAL
  Filled 2024-01-28: qty 1

## 2024-01-28 MED ORDER — DICYCLOMINE HCL 20 MG PO TABS
20.0000 mg | ORAL_TABLET | Freq: Three times a day (TID) | ORAL | Status: DC
  Administered 2024-01-29: 20 mg via ORAL
  Filled 2024-01-28 (×3): qty 1

## 2024-01-28 MED ORDER — METOPROLOL SUCCINATE ER 25 MG PO TB24
25.0000 mg | ORAL_TABLET | Freq: Every day | ORAL | Status: DC
  Administered 2024-01-29: 25 mg via ORAL
  Filled 2024-01-28: qty 1

## 2024-01-28 MED ORDER — BUSPIRONE HCL 10 MG PO TABS
15.0000 mg | ORAL_TABLET | Freq: Two times a day (BID) | ORAL | Status: DC
  Administered 2024-01-28 – 2024-01-29 (×2): 15 mg via ORAL
  Filled 2024-01-28 (×2): qty 2

## 2024-01-28 NOTE — Plan of Care (Signed)
 Patient is a 38 yo female admitted for pneumonia.  Noted to be A/O X4.  Has O2, currently at 2L, via [place], continuous.  RN able to perform general assessment.  Upon inspection of skin, it was noted that the patient has suture to left upper mid flank area; one incision under left breast, closed with surgical glue; one incision  to mid line, left flank site, closed with surgical glue.  No additional signs of altered skin integrity.  Lung fields assessed.  Noted to be clear, but diminished throughout. Patient has reported having discomfort at incision site under left breast.  Has received prn pain medication to assist with symptom management.  Patient noted to have relief after taking medication. Incentive spirometer given to patient with instruction on correct use to maintain adequate respiratory functioning.  The patient's spouse arrives, to stay.  RN able to review with patient and spouse on hospital admission information.  Both have verbalized understanding of information reviewed, verbally agrees with current hospital stay.

## 2024-01-28 NOTE — Progress Notes (Addendum)
 End of shift:  Patient complained of pain that started in upper left quadrant at start of shift which she later voiced that it was radiating to her left flank and back and increasing in intensity. CT with contrast was ordered and completed this shift. Upon coming back towards the end of the shift patient called to say that she was having nausea and had vomited. I gave PRN Zofran . Patient is feeling better now.

## 2024-01-28 NOTE — Progress Notes (Signed)
 Progress Note   Patient: [name] FMW:[id number] DOB: 1986-06-17 DOA: 01/27/2024     0 DOS: the patient was seen and examined on 01/28/2024   Brief hospital course: From HPI SPECIAL RANES is a 38 y.o. female with medical history significant of Bipolar and asthma who is s/p laparoscopic removal of a pericardial cyst via thoracoscopy on 7/28 by cardiothoracic surgery at [facility] being admitted with   COVID infection with left-sided lung collapse/atelectasis seen on chest x-ray.  She presented with persistent left-sided chest pain under her left breast and around to the back where she had the procedure.  She said the pain has been present since the day she had the procedure and does not seem to be going away.  It is constant and hurts to move and is severe in intensity.  She subsequently developed shortness of breath but has a known exposure to her mother who is also COVID-positive.   In the ED tachycardic to 114 and mildly tachypneic to 22 but with O2 sat in the high 90s on room air.  Afebrile and normal blood pressures. Labs notable for WBC 13,000.  Lactic acid not done.  COVID-positive.  Troponin<2 and BNP 6.6.  BMP normal.EKG showed sinus tachycardia at 106. CTA PE protocol negative for PE but showing: New collapse/consolidation in the left lower lobe is likely due to atelectasis, related to recent surgery. The possibility of pneumonia is not excluded:SABRA   The ED provider: Spoke with cardiothoracic surgeon at [facility], Dr. Shyrl who stated that the CT finding was normal and expected.  And recommended incentive spirometry. Patient treated with fentanyl  for pain, started on Rocephin  and azithromycin . Admission requested.    Assessment and Plan: Acute hypoxemic respiratory failure secondary to Left lower lung pneumonia COVID-19 infection Atelectasis left lower lobe s/p thoracoscopy 01/20/2024 Continue guaifenesin  and incentive spirometer Albuterol  HFA f needed Patient without  hypoxia  so no indication for steroids. Can consider oral antivirals-no longer on formulary Continue COVID-19 precaution Procalcitonin less than 0.1 Continue empiric antibiotics with Rocephin  and azithromycin  for any superimposed bacterial pneumonia Currently on 2 L of oxygen, we will continue to wean as tolerated   Left-sided chest pain Pericardial cyst s/p thoracoscopy 01/20/2024 Suspect noncardiac pain related to procedure Had recent negative stress test and normal echo prior to procedure to evaluate chest pain Troponin unremarkable Multimodal pain control CT scan of the abdomen also requested given that she also complains of the pain radiating to the flanks ED physician discussed the CT finding with cardiothoracic surgery Dr. Shyrl who recommended incentive spirometer   Bipolar 2 disorder (HCC) Continue home meds   Asthma Albuterol  as needed      Advance Care Planning:   Code Status: Full Code    Consults: none   Family Communication:   Subjective:  Patient seen and examined at bedside this morning Complaining of left upper quadrant pain as well as left flank pain CT scan of the abdomen requested-results pending Denies nausea vomiting Has been on 2 L of oxygen since admission Coughing improving as well as respiratory function  Physical Exam: General: Seen and examined currently on intranasal oxygen   HENT:     Head: Normocephalic and atraumatic.  Cardiovascular:     Rate and Rhythm: Regular rhythm. Tachycardia present.     Heart sounds: Normal heart sounds.  Pulmonary: Air entry decreased especially on the left Chest:     Comments: Surgical wound left posterolateral chest with no evidence of infection/inflammation Abdominal:  Palpations: Abdomen is soft.     Tenderness: There is no abdominal tenderness.  Neurological:     Mental Status: Mental status is at baseline.      Vitals:   01/28/24 0028 01/28/24 0420 01/28/24 0739 01/28/24 1147  BP: 107/63  108/63 (!) 105/56 107/62  Pulse: 98 99 93 99  Resp: 16 (!) 24 16 19   Temp: 98.4 F (36.9 C) 98.3 F (36.8 C) 98.4 F (36.9 C) 98.1 F (36.7 C)  TempSrc:  Oral Oral   SpO2: 96% 96% 99% 99%  Weight:      Height:        Data Reviewed:  CT angio of the chest does not show any PE Consolidation noted on the left lung    Latest Ref Rng & Units 01/28/2024    5:16 AM 01/27/2024    3:20 PM 01/21/2024    3:22 AM  CBC  WBC 4.0 - 10.5 K/uL 11.1  13.3  12.1   Hemoglobin 12.0 - 15.0 g/dL 88.6  87.6  87.8   Hematocrit 36.0 - 46.0 % 35.0  37.2  35.9   Platelets 150 - 400 K/uL 363  368  318        Latest Ref Rng & Units 01/28/2024    5:16 AM 01/27/2024    3:20 PM 01/21/2024    3:22 AM  BMP  Glucose 70 - 99 mg/dL 885  863  829   BUN 6 - 20 mg/dL 13  12  9    Creatinine 0.44 - 1.00 mg/dL 9.35  9.27  9.21   Sodium 135 - 145 mmol/L 138  140  136   Potassium 3.5 - 5.1 mmol/L 4.0  4.4  4.2   Chloride 98 - 111 mmol/L 106  104  103   CO2 22 - 32 mmol/L 24  25  21    Calcium 8.9 - 10.3 mg/dL 8.7  9.2  9.0      Family Communication: None present at bedside  Disposition: Pending clinical course  Time spent: 52 minutes  Author: Drue ONEIDA Potter, MD 01/28/2024 3:13 PM  For on call review [email or web address].

## 2024-01-28 NOTE — Progress Notes (Signed)
 Patient states that pain in abdomen has migrated from top left quadrant and is now along the left flank and into the back. PRN pain medicine was given. Will follow up with patient and notify MD.

## 2024-01-28 NOTE — TOC Initial Note (Signed)
 Transition of Care [facility]) - Initial/Assessment Note    Patient Details  Name: [name] MRN: [id number] Date of Birth: 1986-03-28  [facility] CM/SW Contact:    Dalia GORMAN Fuse, RN Phone Number: 01/28/2024, 3:13 PM  Clinical Narrative:                  Patient with a history of bipolar disorder admitted for PNA and is COVID positive ( no longer requires isolation). No TOC needs,please outreach to [facility] if needs identified.        Patient Goals and CMS Choice            Expected Discharge Plan and Services                                              Prior Living Arrangements/Services                       Activities of Daily Living   ADL Screening (condition at time of admission) Independently performs ADLs?: Yes (appropriate for developmental age) Is the patient deaf or have difficulty hearing?: No Does the patient have difficulty seeing, even when wearing glasses/contacts?: No Does the patient have difficulty concentrating, remembering, or making decisions?: No  Permission Sought/Granted                  Emotional Assessment              Admission diagnosis:  Shortness of breath [R06.02] Pneumonia [J18.9] Respiratory distress [R06.03] Left-sided chest pain [R07.9] Patient Active Problem List   Diagnosis Date Noted   Pneumonia 01/27/2024   Left-sided chest pain 01/27/2024   COVID-19 virus infection 01/27/2024   Pericardial cyst, s/p thorascopy 01/20/2024 01/20/2024   Mediastinal mass 01/03/2024   Acquired neutrophilia 03/29/2023   Generalized anxiety disorder 08/03/2020   Insomnia due to mental condition 08/03/2020   S/P laparoscopic hysterectomy 12/25/2019   Pelvic pain 11/27/2019   Dyspareunia due to medical condition in female 11/27/2019   Dysmenorrhea 11/27/2019   Radial styloid tenosynovitis 10/13/2019   Bipolar affective disorder (HCC) 01/26/2015   Seizures (HCC) 01/12/2015   Asthma     Hyperlipidemia    Bipolar 2 disorder (HCC)    Allergy    Migraine headache with aura    PCP:  Liana Fish, NP Pharmacy:   [facility] [address], [place] - [address] [place] [address] Phone: [phone] Fax: [phone]  [facility] [address] [place] [address] Phone: [phone] Fax: [phone]     Social Drivers of Health (SDOH) Social History: SDOH Screenings   Food Insecurity: No Food Insecurity (01/27/2024)  Recent Concern: Food Insecurity - Food Insecurity Present (12/17/2023)   Received from [facility]  Housing: Low Risk  (01/27/2024)  Recent Concern: Housing - High Risk (12/17/2023)   Received from [facility]  Transportation Needs: No Transportation Needs (01/27/2024)  Utilities: Not At Risk (01/27/2024)  Alcohol Screen: Low Risk  (12/01/2020)  Depression (PHQ2-9): Low Risk  (03/29/2023)  Recent Concern: Depression (PHQ2-9) - Medium Risk (02/28/2023)  Financial Resource Strain: Medium Risk (12/17/2023)   Received from [facility]  Physical Activity: Sufficiently Active (11/12/2019)  Social Connections: Moderately Isolated (11/12/2019)  Stress: Stress Concern Present (11/12/2019)  Tobacco Use:  Medium Risk (01/27/2024)   SDOH Interventions:     Readmission Risk Interventions     No data to display

## 2024-01-28 NOTE — Clinical Note (Incomplete)
 Noted to have new onset of facial tingling

## 2024-01-29 DIAGNOSIS — U071 COVID-19: Secondary | ICD-10-CM | POA: Diagnosis not present

## 2024-01-29 LAB — CBC WITH DIFFERENTIAL/PLATELET
Abs Immature Granulocytes: 0.11 K/uL — ABNORMAL HIGH (ref 0.00–0.07)
Basophils Absolute: 0 K/uL (ref 0.0–0.1)
Basophils Relative: 0 %
Eosinophils Absolute: 0.3 K/uL (ref 0.0–0.5)
Eosinophils Relative: 3 %
HCT: 34.1 % — ABNORMAL LOW (ref 36.0–46.0)
Hemoglobin: 11.5 g/dL — ABNORMAL LOW (ref 12.0–15.0)
Immature Granulocytes: 1 %
Lymphocytes Relative: 24 %
Lymphs Abs: 2.8 K/uL (ref 0.7–4.0)
MCH: 29 pg (ref 26.0–34.0)
MCHC: 33.7 g/dL (ref 30.0–36.0)
MCV: 86.1 fL (ref 80.0–100.0)
Monocytes Absolute: 0.9 K/uL (ref 0.1–1.0)
Monocytes Relative: 8 %
Neutro Abs: 7.3 K/uL (ref 1.7–7.7)
Neutrophils Relative %: 64 %
Platelets: 359 K/uL (ref 150–400)
RBC: 3.96 MIL/uL (ref 3.87–5.11)
RDW: 13.8 % (ref 11.5–15.5)
WBC: 11.4 K/uL — ABNORMAL HIGH (ref 4.0–10.5)
nRBC: 0 % (ref 0.0–0.2)

## 2024-01-29 LAB — BASIC METABOLIC PANEL WITH GFR
Anion gap: 7 (ref 5–15)
BUN: 15 mg/dL (ref 6–20)
CO2: 27 mmol/L (ref 22–32)
Calcium: 9.3 mg/dL (ref 8.9–10.3)
Chloride: 103 mmol/L (ref 98–111)
Creatinine, Ser: 0.64 mg/dL (ref 0.44–1.00)
GFR, Estimated: >60 mL/min (ref >60)
Glucose, Bld: 129 mg/dL — ABNORMAL HIGH (ref 70–99)
Potassium: 4 mmol/L (ref 3.5–5.1)
Sodium: 137 mmol/L (ref 135–145)

## 2024-01-29 MED ORDER — MELOXICAM 15 MG PO TABS: 15.0000 mg | ORAL_TABLET | Freq: Every day | ORAL | 0 refills | Status: DC

## 2024-01-29 MED ORDER — BENZONATATE 200 MG PO CAPS: 200.0000 mg | ORAL_CAPSULE | Freq: Three times a day (TID) | ORAL | 0 refills | Status: DC | PRN

## 2024-01-29 MED ORDER — OXYCODONE HCL 5 MG PO TABS: 5.0000 mg | ORAL_TABLET | Freq: Four times a day (QID) | ORAL | 0 refills | Status: DC | PRN

## 2024-01-29 MED ORDER — NORTRIPTYLINE HCL 10 MG PO CAPS: 40.0000 mg | ORAL_CAPSULE | Freq: Every day | ORAL | 0 refills | Status: AC

## 2024-01-29 NOTE — Discharge Summary (Signed)
 Physician Discharge Summary   Patient: [name] MRN: [id number]  DOB: January 11, 1986   Admit:     Date of Admission: 01/27/2024 Admitted from: home   Discharge: Date of discharge: 01/29/24 Disposition: Home Condition at discharge: good  CODE STATUS: FULL CODE     Discharge Physician: Laneta Blunt, DO [facility]     PCP: Liana Fish, NP  Recommendations for Outpatient Follow-up:  Follow up with PCP Liana Fish, NP, with cardiology and with cardiothoracic surgery as scheduled   Discharge Instructions     Diet Carb Modified   Complete by: As directed    Increase activity slowly   Complete by: As directed    Leave dressing on - Keep it clean, dry, and intact until clinic visit   Complete by: As directed          Discharge Diagnoses: Principal Problem:   Pneumonia Active Problems:   COVID-19 virus infection   Pericardial cyst, s/p thorascopy 01/20/2024   Left-sided chest pain   Asthma   Bipolar 2 disorder [facility])       Hospital course / significant events:   HPI: [name] is a 38 y.o. female with medical history significant of Bipolar and asthma who is s/p laparoscopic removal of a pericardial cyst via thoracoscopy on 7/28 by cardiothoracic surgery at [facility] and was discharged from that facility the following day on 07/29. On 08/04, presented to [facility] ED 08/04 with CP/SOB, pain on L chest which has been present since the surgery but SOB is new. Pt reports her mother is (+)COVID and has been staying with patient since her surgery. Of note, prior to her procedure patient had chest pain workup with a negative stress test and normal echo.   08/04: to ED. (+)COVID. CTA PE protocol negative for PE but showing New collapse/consolidation in the left lower lobe is likely due to atelectasis, related to recent surgery. The possibility of pneumonia is not excluded. The ED provider here spoke with cardiothoracic surgeon at [facility],  Dr. Shyrl who stated that the CT finding was normal / expected, recommended incentive spirometry. Tx fentanyl  and started on rocephin  / azithromycin , admitted to hospitalist.  08/05: reporting LUQ abd pain, CT abd/pelv no concerning findings. Remains on 2L O2 08/06: on room air. Still reporting chest/torso pain around L breast and anterior ribcage but states it has not changed or worsened. I reviewed images personally to ensure no hematoma or other findings posisbly overlooked by radiologist but scans look to be consistent w/ reports. Pt is frustrated that the pain medicine isn't working - I offered to repeat imaging to ensure stability but she declined this. Stable for discharge home     Consultants:  none  Procedures/Surgeries: non      ASSESSMENT & PLAN:   Left lower lung pneumonia vs atelectasis COVID-19 infection. Less likely bacterial pneumonia  Atelectasis left lower lobe  S/p thoracoscopy 01/20/2024 Continue incentive spirometer Pt reports allergy to robitussin but cannot specify guaifenesin  vs dextromethorphan component to this, continue tessalon  Albuterol  HFA prn without hypoxia  so no indication for steroids. Continue COVID-19 precaution Procalcitonin less than 0.1, can dc abx    Left-sided chest pain Pericardial cyst, s/p thoracoscopy 01/20/2024 Suspect noncardiac pain related to procedure Had recent negative stress test and normal echo prior to procedure to evaluate chest pain Troponin unremarkable No concerning findings on imaging Repeat imaging offered today given persistent pain and pt frustrated with pain, but she declined repeat imaging  Multimodal pain control ED physician discussed the CT chest  with cardiothoracic surgery Dr. Shyrl who recommended incentive spirometer   Bipolar 2 disorder Continue home meds   Asthma Albuterol  as needed    Class 2 obesity based on BMI: Body mass index is 38.78 kg/m.SABRA Significantly low or high BMI is  associated with higher medical risk.  Underweight - under 18  overweight - 25 to 29 obese - 30 or more Class 1 obesity: BMI of 30.0 to 34 Class 2 obesity: BMI of 35.0 to 39 Class 3 obesity: BMI of 40.0 to 49 Super Morbid Obesity: BMI 50-59 Super-super Morbid Obesity: BMI 60+ Healthy nutrition and physical activity advised as adjunct to other disease management and risk reduction treatments           Discharge Instructions  Allergies as of 01/29/2024       Reactions   Robitussin (alcohol Free) [guaifenesin ] Anaphylaxis   Strawberry Extract Rash   CAN'T EVEN TOUCH STRAWBERRIES   Aspirin Other (See Comments)   Nose bleeds   Chocolate Flavoring Agent (non-screening) Rash   Codeine Rash        Medication List     TAKE these medications    acetaminophen  325 MG tablet Commonly known as: Tylenol  Take 2 tablets (650 mg total) by mouth every 6 (six) hours as needed.   albuterol  108 (90 Base) MCG/ACT inhaler Commonly known as: VENTOLIN  HFA Inhale 2 puffs into the lungs every 4 (four) hours as needed for wheezing or shortness of breath.   benzonatate  200 MG capsule Commonly known as: TESSALON  Take 1 capsule (200 mg total) by mouth 3 (three) times daily as needed for cough.   busPIRone  15 MG tablet Commonly known as: BUSPAR  Take 15 mg by mouth 2 (two) times daily.   desonide  0.05 % ointment Commonly known as: DESOWEN  Apply 1 gram to the skin on both elbows daily as needed for dry skin/rash   dicyclomine  20 MG tablet Commonly known as: BENTYL  Take 1 tablet (20 mg total) by mouth 4 (four) times daily -  before meals and at bedtime. FOR IBS   diltiazem  180 MG 24 hr capsule Commonly known as: CARDIZEM  CD Take 180 mg by mouth daily.   ferrous sulfate  325 (65 FE) MG EC tablet Take 325 mg by mouth daily.   fluticasone -salmeterol 115-21 MCG/ACT inhaler Commonly known as: ADVAIR HFA Inhale 2 puffs into the lungs 2 (two) times daily.   folic acid  1 MG  tablet Commonly known as: FOLVITE  Take 1 tablet (1 mg total) by mouth daily.   meloxicam  15 MG tablet Commonly known as: MOBIC  Take 1 tablet (15 mg total) by mouth daily.   metoprolol  succinate 25 MG 24 hr tablet Commonly known as: TOPROL -XL Take 25 mg by mouth daily.   multivitamin with minerals Tabs tablet Take 1 tablet by mouth daily.   nortriptyline  10 MG capsule Commonly known as: PAMELOR  Take 4 capsules (40 mg total) by mouth at bedtime for 14 days.   omeprazole  40 MG capsule Commonly known as: PRILOSEC TAKE 1 CAPSULE BY MOUTH ONCE DAILY FOR  HEARTBURN   oxyCODONE  5 MG immediate release tablet Commonly known as: Oxy IR/ROXICODONE  Take 1-2 tablets (5-10 mg total) by mouth every 6 (six) hours as needed for severe pain (pain score 7-10) or moderate pain (pain score 4-6) (5 mg (1 tablet) as needed for moderate pain, 10 mg (2 tablets) as needed for severe pain). What changed:  how much to take reasons to take  this   promethazine  12.5 MG tablet Commonly known as: PHENERGAN  Take 1 tablet (12.5 mg total) by mouth every 6 (six) hours as needed for nausea or vomiting.   traZODone  100 MG tablet Commonly known as: DESYREL  Take 100 mg by mouth at bedtime.   venlafaxine  XR 75 MG 24 hr capsule Commonly known as: EFFEXOR -XR Take 75 mg by mouth at bedtime.               Discharge Care Instructions  (From admission, onward)           Start     Ordered   01/29/24 0000  Leave dressing on - Keep it clean, dry, and intact until clinic visit        01/29/24 1140             Follow-up Information     Liana Fish, NP Follow up.   Specialty: Nurse Practitioner Why: hospital follow up Contact information: [address] [place] [address] [phone]                 Allergies  Allergen Reactions   Robitussin (Alcohol Free) [Guaifenesin ] Anaphylaxis   Strawberry Extract Rash    CAN'T EVEN TOUCH STRAWBERRIES   Aspirin Other (See Comments)     Nose bleeds   Chocolate Flavoring Agent (Non-Screening) Rash   Codeine Rash     Subjective: pt reports no SOB, some persistent coughing, no fever/chills, tolerating diet, ambulating without CP/SOB.    Discharge Exam: BP 121/84 (BP Location: Right Arm)   Pulse (!) 104   Temp 97.9 F (36.6 C)   Resp 17   Ht 5' 5 (1.651 m)   Wt 105.7 kg   LMP 11/09/2019   SpO2 97%   BMI 38.78 kg/m  General: Pt is alert, awake, not in acute distress Cardiovascular: RRR, S1/S2 +, no rubs, no gallops Respiratory: CTA bilaterally, no wheezing, no rhonchi Abdominal: Soft, NT, ND, bowel sounds + Extremities: no edema, no cyanosis     The results of significant diagnostics from this hospitalization (including imaging, microbiology, ancillary and laboratory) are listed below for reference.     Microbiology: Recent Results (from the past 240 hours)  Resp panel by RT-PCR (RSV, Flu A&B, Covid) Anterior Nasal Swab     Status: Abnormal   Collection Time: 01/27/24  3:20 PM   Specimen: Anterior Nasal Swab  Result Value Ref Range Status   SARS Coronavirus 2 by RT PCR POSITIVE (A) NEGATIVE Final    Comment: (NOTE) SARS-CoV-2 target nucleic acids are DETECTED.  The SARS-CoV-2 RNA is generally detectable in upper respiratory specimens during the acute phase of infection. Positive results are indicative of the presence of the identified virus, but do not rule out bacterial infection or co-infection with other pathogens not detected by the test. Clinical correlation with patient history and other diagnostic information is necessary to determine patient infection status. The expected result is Negative.  Fact Sheet for Patients: BloggerCourse.com  Fact Sheet for Healthcare Providers: SeriousBroker.it  This test is not yet approved or cleared by the [place]  FDA and  has been authorized for detection and/or diagnosis of SARS-CoV-2 by FDA  under an Emergency Use Authorization (EUA).  This EUA will remain in effect (meaning this test can be used) for the duration of  the COVID-19 declaration under Section 564(b)(1) of the A ct, 21 U.S.C. section 360bbb-3(b)(1), unless the authorization is terminated or revoked sooner.     Influenza A by PCR NEGATIVE NEGATIVE  Final   Influenza B by PCR NEGATIVE NEGATIVE Final    Comment: (NOTE) The Xpert Xpress SARS-CoV-2/FLU/RSV plus assay is intended as an aid in the diagnosis of influenza from Nasopharyngeal swab specimens and should not be used as a sole basis for treatment. Nasal washings and aspirates are unacceptable for Xpert Xpress SARS-CoV-2/FLU/RSV testing.  Fact Sheet for Patients: BloggerCourse.com  Fact Sheet for Healthcare Providers: SeriousBroker.it  This test is not yet approved or cleared by the [place]  FDA and has been authorized for detection and/or diagnosis of SARS-CoV-2 by FDA under an Emergency Use Authorization (EUA). This EUA will remain in effect (meaning this test can be used) for the duration of the COVID-19 declaration under Section 564(b)(1) of the Act, 21 U.S.C. section 360bbb-3(b)(1), unless the authorization is terminated or revoked.     Resp Syncytial Virus by PCR NEGATIVE NEGATIVE Final    Comment: (NOTE) Fact Sheet for Patients: BloggerCourse.com  Fact Sheet for Healthcare Providers: SeriousBroker.it  This test is not yet approved or cleared by the [place]  FDA and has been authorized for detection and/or diagnosis of SARS-CoV-2 by FDA under an Emergency Use Authorization (EUA). This EUA will remain in effect (meaning this test can be used) for the duration of the COVID-19 declaration under Section 564(b)(1) of the Act, 21 U.S.C. section 360bbb-3(b)(1), unless the authorization is terminated or revoked.  Performed at [facility], [address] [place], [place] [address]      Labs: BNP (last 3 results) Recent Labs    01/27/24 1520  BNP 6.6   Basic Metabolic Panel: Recent Labs  Lab 01/27/24 1520 01/28/24 0516 01/29/24 0344  NA 140 138 137  K 4.4 4.0 4.0  CL 104 106 103  CO2 25 24 27   GLUCOSE 136* 114* 129*  BUN 12 13 15   CREATININE 0.72 0.64 0.64  CALCIUM 9.2 8.7* 9.3   Liver Function Tests: No results for input(s): AST, ALT, ALKPHOS, BILITOT, PROT, ALBUMIN in the last 168 hours. No results for input(s): LIPASE, AMYLASE in the last 168 hours. No results for input(s): AMMONIA in the last 168 hours. CBC: Recent Labs  Lab 01/27/24 1520 01/28/24 0516 01/29/24 0344  WBC 13.3* 11.1* 11.4*  NEUTROABS  --   --  7.3  HGB 12.3 11.3* 11.5*  HCT 37.2 35.0* 34.1*  MCV 87.5 87.3 86.1  PLT 368 363 359   Cardiac Enzymes: No results for input(s): CKTOTAL, CKMB, CKMBINDEX, TROPONINI in the last 168 hours. BNP: Invalid input(s): POCBNP CBG: No results for input(s): GLUCAP in the last 168 hours. D-Dimer No results for input(s): DDIMER in the last 72 hours. Hgb A1c No results for input(s): HGBA1C in the last 72 hours. Lipid Profile No results for input(s): CHOL, HDL, LDLCALC, TRIG, CHOLHDL, LDLDIRECT in the last 72 hours. Thyroid function studies No results for input(s): TSH, T4TOTAL, T3FREE, THYROIDAB in the last 72 hours.  Invalid input(s): FREET3 Anemia work up No results for input(s): VITAMINB12, FOLATE, FERRITIN, TIBC, IRON, RETICCTPCT in the last 72 hours. Urinalysis    Component Value Date/Time   COLORURINE YELLOW 01/16/2024 1517   APPEARANCEUR CLOUDY (A) 01/16/2024 1517   APPEARANCEUR Clear 01/29/2013 1300   LABSPEC 1.006 01/16/2024 1517   LABSPEC 1.005 01/29/2013 1300   PHURINE 7.0 01/16/2024 1517   GLUCOSEU NEGATIVE 01/16/2024 1517   GLUCOSEU Negative 01/29/2013 1300   HGBUR NEGATIVE 01/16/2024 1517    BILIRUBINUR NEGATIVE 01/16/2024 1517   BILIRUBINUR negative 11/18/2018 1112   BILIRUBINUR Negative 01/29/2013  1300   KETONESUR NEGATIVE 01/16/2024 1517   PROTEINUR NEGATIVE 01/16/2024 1517   UROBILINOGEN 0.2 11/18/2018 1112   NITRITE NEGATIVE 01/16/2024 1517   LEUKOCYTESUR NEGATIVE 01/16/2024 1517   LEUKOCYTESUR 3+ 01/29/2013 1300   Sepsis Labs Recent Labs  Lab 01/27/24 1520 01/28/24 0516 01/29/24 0344  WBC 13.3* 11.1* 11.4*   Microbiology Recent Results (from the past 240 hours)  Resp panel by RT-PCR (RSV, Flu A&B, Covid) Anterior Nasal Swab     Status: Abnormal   Collection Time: 01/27/24  3:20 PM   Specimen: Anterior Nasal Swab  Result Value Ref Range Status   SARS Coronavirus 2 by RT PCR POSITIVE (A) NEGATIVE Final    Comment: (NOTE) SARS-CoV-2 target nucleic acids are DETECTED.  The SARS-CoV-2 RNA is generally detectable in upper respiratory specimens during the acute phase of infection. Positive results are indicative of the presence of the identified virus, but do not rule out bacterial infection or co-infection with other pathogens not detected by the test. Clinical correlation with patient history and other diagnostic information is necessary to determine patient infection status. The expected result is Negative.  Fact Sheet for Patients: BloggerCourse.com  Fact Sheet for Healthcare Providers: SeriousBroker.it  This test is not yet approved or cleared by the [place]  FDA and  has been authorized for detection and/or diagnosis of SARS-CoV-2 by FDA under an Emergency Use Authorization (EUA).  This EUA will remain in effect (meaning this test can be used) for the duration of  the COVID-19 declaration under Section 564(b)(1) of the A ct, 21 U.S.C. section 360bbb-3(b)(1), unless the authorization is terminated or revoked sooner.     Influenza A by PCR NEGATIVE NEGATIVE Final   Influenza B by PCR NEGATIVE  NEGATIVE Final    Comment: (NOTE) The Xpert Xpress SARS-CoV-2/FLU/RSV plus assay is intended as an aid in the diagnosis of influenza from Nasopharyngeal swab specimens and should not be used as a sole basis for treatment. Nasal washings and aspirates are unacceptable for Xpert Xpress SARS-CoV-2/FLU/RSV testing.  Fact Sheet for Patients: BloggerCourse.com  Fact Sheet for Healthcare Providers: SeriousBroker.it  This test is not yet approved or cleared by the [place]  FDA and has been authorized for detection and/or diagnosis of SARS-CoV-2 by FDA under an Emergency Use Authorization (EUA). This EUA will remain in effect (meaning this test can be used) for the duration of the COVID-19 declaration under Section 564(b)(1) of the Act, 21 U.S.C. section 360bbb-3(b)(1), unless the authorization is terminated or revoked.     Resp Syncytial Virus by PCR NEGATIVE NEGATIVE Final    Comment: (NOTE) Fact Sheet for Patients: BloggerCourse.com  Fact Sheet for Healthcare Providers: SeriousBroker.it  This test is not yet approved or cleared by the [place]  FDA and has been authorized for detection and/or diagnosis of SARS-CoV-2 by FDA under an Emergency Use Authorization (EUA). This EUA will remain in effect (meaning this test can be used) for the duration of the COVID-19 declaration under Section 564(b)(1) of the Act, 21 U.S.C. section 360bbb-3(b)(1), unless the authorization is terminated or revoked.  Performed at [facility], [address] [place], [place] [address]    Imaging CT ABDOMEN PELVIS WO CONTRAST Result Date: 01/28/2024 CLINICAL DATA:  COVID positive, abdominal pain EXAM: CT ABDOMEN AND PELVIS WITHOUT CONTRAST TECHNIQUE: Multidetector CT imaging of the abdomen and pelvis was performed following the standard protocol without IV contrast. RADIATION DOSE  REDUCTION: This exam was performed according to the departmental dose-optimization program which includes automated exposure  control, adjustment of the mA and/or kV according to patient size and/or use of iterative reconstruction technique. COMPARISON:  CT 05/27/2022, chest CT 01/27/2024, chest x-ray 01/27/2024 FINDINGS: Lower chest: Left lower lobe consolidative airspace disease, partially imaged. Hepatobiliary: Hepatic steatosis. No calcified gallstone or biliary dilatation. Pancreas: Unremarkable. No pancreatic ductal dilatation or surrounding inflammatory changes. Spleen: Normal in size without focal abnormality. Adrenals/Urinary Tract: Adrenal glands 12 mm hypodense nodule consistent with adenoma, no imaging follow-up recommended. Kidneys show no hydronephrosis. The bladder is unremarkable Stomach/Bowel: Stomach is within normal limits. Appendix appears normal. No evidence of bowel wall thickening, distention, or inflammatory changes. Moderate stool in the colon Vascular/Lymphatic: Minimal aortic atherosclerosis. No enlarged abdominal or pelvic lymph nodes. Reproductive: Status post hysterectomy. No adnexal masses. Other: Negative for pelvic effusion or free air. Small fat containing umbilical hernia Musculoskeletal: No acute or significant osseous findings. IMPRESSION: 1. No CT evidence for acute intra-abdominal or pelvic abnormality. 2. Left lower lobe consolidative airspace disease, partially imaged, reference previously performed chest CT. 3. Hepatic steatosis. 4. Aortic atherosclerosis. Aortic Atherosclerosis (ICD10-I70.0). Electronically Signed   By: Luke Bun M.D.   On: 01/28/2024 19:01   CT Angio Chest PE W/Cm &/Or Wo Cm Result Date: 01/27/2024 CLINICAL DATA:  Shortness of breath and pain beneath the left breast. EXAM: CT ANGIOGRAPHY CHEST WITH CONTRAST TECHNIQUE: Multidetector CT imaging of the chest was performed using the standard protocol during bolus administration of intravenous contrast.  Multiplanar CT image reconstructions and MIPs were obtained to evaluate the vascular anatomy. RADIATION DOSE REDUCTION: This exam was performed according to the departmental dose-optimization program which includes automated exposure control, adjustment of the mA and/or kV according to patient size and/or use of iterative reconstruction technique. CONTRAST:  75mL OMNIPAQUE  IOHEXOL  350 MG/ML SOLN COMPARISON:  MR chest 11/13/2023, PET 10/28/2023, CT chest 10/08/2023. FINDINGS: Cardiovascular: Negative for pulmonary embolus. Heart is enlarged with left ventricular dilatation. No pericardial effusion. Mediastinum/Nodes: No pathologically enlarged mediastinal, hilar or axillary lymph nodes. Interval removal of previously seen prevascular pericardial cyst on the left when compared with 10/08/2023. Esophagus is grossly unremarkable. Lungs/Pleura: Lingular volume loss, adjacent to an elevated left hemidiaphragm. Additional collapse/consolidation in the left lower lobe, new from 12/19/2023. No pleural fluid. Airway is unremarkable. Upper Abdomen: Small right adrenal adenoma. No specific follow-up necessary. Visualized portions of the liver, gallbladder, adrenal glands, kidneys, spleen, pancreas, stomach and bowel are otherwise grossly unremarkable. No upper abdominal adenopathy. Musculoskeletal: None. Review of the MIP images confirms the above findings. IMPRESSION: 1. Negative for pulmonary embolus. 2. New collapse/consolidation in the left lower lobe is likely due to atelectasis, related to recent surgery. The possibility of pneumonia is not excluded. Electronically Signed   By: Newell Eke M.D.   On: 01/27/2024 18:57   DG Chest 2 View Result Date: 01/27/2024 CLINICAL DATA:  Shortness of breath status post pericardial cyst surgery EXAM: CHEST - 2 VIEW COMPARISON:  Chest radiograph dated 01/21/2024 FINDINGS: Normal lung volumes. Increased interstitial opacities. Persistent hazy and linear left basilar opacity. No  pleural effusion or pneumothorax. The heart size and mediastinal contours are within normal limits. No acute osseous abnormality. IMPRESSION: 1. Increased interstitial opacities, which may represent pulmonary edema or atypical infection. 2. Persistent hazy and linear left basilar opacity, likely atelectasis. Electronically Signed   By: Limin  Xu M.D.   On: 01/27/2024 15:51      Time coordinating discharge: over 30 minutes  SIGNED:  Elivia Robotham DO [facility]

## 2024-01-29 NOTE — Hospital Course (Signed)
 Hospital course / significant events:   HPI: [name] is a 38 y.o. female with medical history significant of Bipolar and asthma who is s/p laparoscopic removal of a pericardial cyst via thoracoscopy on 7/28 by cardiothoracic surgery at [facility] and was discharged from that facility the following day on 07/29. On 08/04, presented to [facility] ED 08/04 with CP/SOB, pain on L chest which has been present since the surgery but SOB is new. Pt reports her mother is (+)COVID and has been staying with patient since her surgery. Of note, prior to her procedure patient had chest pain workup with a negative stress test and normal echo.   08/04: to ED. (+)COVID. CTA PE protocol negative for PE but showing New collapse/consolidation in the left lower lobe is likely due to atelectasis, related to recent surgery. The possibility of pneumonia is not excluded. The ED provider here spoke with cardiothoracic surgeon at [facility], Dr. Shyrl who stated that the CT finding was normal / expected, recommended incentive spirometry. Tx fentanyl  and started on rocephin  / azithromycin , admitted to hospitalist.  08/05: reporting LUQ abd pain, CT abd/pelv no concerning findings. Remains on 2L O2 08/06: room air.     Consultants:  ***  Procedures/Surgeries: ***      ASSESSMENT & PLAN:   Left lower lung pneumonia COVID-19 infection Atelectasis left lower lobe s/p thoracoscopy 01/20/2024 Continue guaifenesin  and incentive spirometer Albuterol  HFA f needed Patient without hypoxia  so no indication for steroids. Can consider oral antivirals-no longer on formulary Continue COVID-19 precaution Procalcitonin less than 0.1 Continue empiric antibiotics with Rocephin  and azithromycin  for any superimposed bacterial pneumonia Currently on 2 L of oxygen, we will continue to wean as tolerated   Left-sided chest pain Pericardial cyst s/p thoracoscopy 01/20/2024 Suspect noncardiac pain related to procedure Had  recent negative stress test and normal echo prior to procedure to evaluate chest pain Troponin unremarkable Multimodal pain control CT scan of the abdomen also requested given that she also complains of the pain radiating to the flanks ED physician discussed the CT finding with cardiothoracic surgery Dr. Shyrl who recommended incentive spirometer   Bipolar 2 disorder (HCC) Continue home meds   Asthma Albuterol  as needed    *** based on BMI: Body mass index is 38.78 kg/m.SABRA Significantly low or high BMI is associated with higher medical risk.  Underweight - under 18  overweight - 25 to 29 obese - 30 or more Class 1 obesity: BMI of 30.0 to 34 Class 2 obesity: BMI of 35.0 to 39 Class 3 obesity: BMI of 40.0 to 49 Super Morbid Obesity: BMI 50-59 Super-super Morbid Obesity: BMI 60+ Healthy nutrition and physical activity advised as adjunct to other disease management and risk reduction treatments    DVT prophylaxis: *** IV fluids: *** continuous IV fluids  Nutrition: *** Central lines / other devices: ***  Code Status: *** ACP documentation reviewed: *** none on file in [facility]  TOC needs: *** Medical barriers to dispo: ***. Expected medical readiness for discharge ***.

## 2024-01-29 NOTE — Plan of Care (Signed)
  Problem: Coping: Goal: Psychosocial and spiritual needs will be supported Outcome: Progressing   Problem: Respiratory: Goal: Complications related to the disease process, condition or treatment will be avoided or minimized Outcome: Progressing   Problem: Education: Goal: Knowledge of General Education information will improve Description: Including pain rating scale, medication(s)/side effects and non-pharmacologic comfort measures Outcome: Progressing   Problem: Health Behavior/Discharge Planning: Goal: Ability to manage health-related needs will improve Outcome: Progressing

## 2024-02-06 ENCOUNTER — Other Ambulatory Visit: Payer: Self-pay | Admitting: Thoracic Surgery (Cardiothoracic Vascular Surgery)

## 2024-02-06 DIAGNOSIS — Q248 Other specified congenital malformations of heart: Secondary | ICD-10-CM

## 2024-02-07 ENCOUNTER — Ambulatory Visit (HOSPITAL_COMMUNITY)
Admission: RE | Admit: 2024-02-07 | Discharge: 2024-02-07 | Disposition: A | Discharge status: Home / Self Care | Payer: MEDICAID | Source: Ambulatory Visit | Attending: Cardiology | Admitting: Cardiology

## 2024-02-07 ENCOUNTER — Ambulatory Visit
Payer: MEDICAID | Attending: Thoracic Surgery (Cardiothoracic Vascular Surgery) | Admitting: Thoracic Surgery (Cardiothoracic Vascular Surgery)

## 2024-02-07 VITALS — BP 120/74 | HR 104 | Resp 18 | Ht 65.0 in

## 2024-02-07 DIAGNOSIS — Q248 Other specified congenital malformations of heart: Secondary | ICD-10-CM | POA: Diagnosis present

## 2024-02-07 DIAGNOSIS — Z09 Encounter for follow-up examination after completed treatment for conditions other than malignant neoplasm: Secondary | ICD-10-CM

## 2024-02-07 NOTE — Progress Notes (Signed)
      [address]       [place] [address]             [phone]        [name] [facility] Medical Record #[id number] Date of Birth: 02-11-1986  Referring: Ruthell Lauraine FALCON, NP Primary Care: Liana Fish, NP Primary Cardiologist:None  Reason for visit:   follow-up  History of Present Illness:     [name] presents for their first follow-up appointment.  Overall, she is doing well.  She does have some pain at the anterior incision  Physical Exam: BP 120/74 (BP Location: Left Arm)   Pulse (!) 104   Resp 18   Ht 5' 5 (1.651 m)   LMP 11/09/2019   SpO2 95%   BMI 38.78 kg/m   Alert NAD Incision clean.   Abdomen, ND no peripheral edema   Diagnostic Studies & Laboratory data:  Path:  Benign cyst    Assessment / Plan:   38 y.o. female s/p pericardial cyst resection.  Results were benign.  Her CXR is clear.  She will follow-up as needed.      [name] 02/07/2024 3:38 PM

## 2024-02-20 ENCOUNTER — Emergency Department: Payer: MEDICAID

## 2024-02-20 ENCOUNTER — Emergency Department
Admission: EM | Admit: 2024-02-20 | Discharge: 2024-02-21 | Disposition: A | Discharge status: Home / Self Care | Payer: MEDICAID | Attending: Emergency Medicine | Admitting: Emergency Medicine

## 2024-02-20 ENCOUNTER — Other Ambulatory Visit: Payer: Self-pay

## 2024-02-20 ENCOUNTER — Inpatient Hospital Stay: Payer: MEDICAID | Admitting: Nurse Practitioner

## 2024-02-20 DIAGNOSIS — R0600 Dyspnea, unspecified: Secondary | ICD-10-CM

## 2024-02-20 DIAGNOSIS — R000 Tachycardia, unspecified: Secondary | ICD-10-CM | POA: Diagnosis not present

## 2024-02-20 DIAGNOSIS — R0789 Other chest pain: Secondary | ICD-10-CM | POA: Insufficient documentation

## 2024-02-20 DIAGNOSIS — R0602 Shortness of breath: Secondary | ICD-10-CM | POA: Diagnosis not present

## 2024-02-20 DIAGNOSIS — J45909 Unspecified asthma, uncomplicated: Secondary | ICD-10-CM | POA: Diagnosis not present

## 2024-02-20 LAB — BASIC METABOLIC PANEL WITH GFR
Anion gap: 17 — ABNORMAL HIGH (ref 5–15)
BUN: 10 mg/dL (ref 6–20)
CO2: 17 mmol/L — ABNORMAL LOW (ref 22–32)
Calcium: 10 mg/dL (ref 8.9–10.3)
Chloride: 102 mmol/L (ref 98–111)
Creatinine, Ser: 0.84 mg/dL (ref 0.44–1.00)
GFR, Estimated: >60 mL/min (ref >60)
Glucose, Bld: 111 mg/dL — ABNORMAL HIGH (ref 70–99)
Potassium: 4 mmol/L (ref 3.5–5.1)
Sodium: 136 mmol/L (ref 135–145)

## 2024-02-20 LAB — CBC
HCT: 42.6 % (ref 36.0–46.0)
Hemoglobin: 14.2 g/dL (ref 12.0–15.0)
MCH: 28.2 pg (ref 26.0–34.0)
MCHC: 33.3 g/dL (ref 30.0–36.0)
MCV: 84.7 fL (ref 80.0–100.0)
Platelets: 408 K/uL — ABNORMAL HIGH (ref 150–400)
RBC: 5.03 MIL/uL (ref 3.87–5.11)
RDW: 13.6 % (ref 11.5–15.5)
WBC: 18.2 K/uL — ABNORMAL HIGH (ref 4.0–10.5)
nRBC: 0 % (ref 0.0–0.2)

## 2024-02-20 LAB — TROPONIN I (HIGH SENSITIVITY): Troponin I (High Sensitivity): 3 ng/L (ref <18)

## 2024-02-20 MED ORDER — MIDAZOLAM HCL 2 MG/2ML IJ SOLN
2.0000 mg | Freq: Once | INTRAMUSCULAR | Status: AC
  Administered 2024-02-20: 2 mg via INTRAVENOUS
  Filled 2024-02-20: qty 2

## 2024-02-20 MED ORDER — LACTATED RINGERS IV BOLUS
1000.0000 mL | Freq: Once | INTRAVENOUS | Status: AC
  Administered 2024-02-20: 1000 mL via INTRAVENOUS

## 2024-02-20 MED ORDER — IOHEXOL 350 MG/ML SOLN
100.0000 mL | Freq: Once | INTRAVENOUS | Status: AC | PRN
  Administered 2024-02-21: 100 mL via INTRAVENOUS

## 2024-02-20 NOTE — ED Triage Notes (Signed)
 Pt reports she had pericardial cyst removed in late July at [facility], pt states ever since then she has had intermittent sob and chest pain but both have gotten worse tonight, pt also states she began vomiting pta.

## 2024-02-20 NOTE — ED Notes (Signed)
 Gordan, MD at bedside at this time.

## 2024-02-20 NOTE — ED Provider Notes (Signed)
 [facility] Provider Note    Event Date/Time   First MD Initiated Contact with Patient 02/20/24 2258     (approximate)   History   Shortness of Breath   HPI [name] is a 38 y.o. female whose medical history is most notable for a cardiothoracic surgery about a month ago to remove a pericardial cyst, as well as a history of anxiety and bipolar disorder and asthma (requiring daily medications and breathing treatments).  She has been having issues with pain and shortness of breath since the event and was recently hospitalized about 3 weeks ago here at [facility] due to shortness of breath and chest pain/tightness, but had a reassuring workup at that time.    She presents tonight with shortness of breath and chest tightness.  She said that the symptoms been present since the surgery but they just got worse tonight despite using her allergy medications.  She feels very anxious because she feels like she cannot take a deep breath.  She said that it is the same feeling she has been having but it would not get better tonight as she got very scared because she felt like she could not breathe.  She has not passed out.  No history of blood clots in the legs of the lungs.  She is status post hysterectomy and salpingectomy.     Physical Exam   Triage Vital Signs: ED Triage Vitals  Encounter Vitals Group     BP 02/20/24 2243 (!) 139/99     Girls Systolic BP Percentile --      Girls Diastolic BP Percentile --      Boys Systolic BP Percentile --      Boys Diastolic BP Percentile --      Pulse Rate 02/20/24 2248 (!) 167     Resp 02/20/24 2243 (!) 24     Temp 02/20/24 2243 98.7 F (37.1 C)     Temp src --      SpO2 02/20/24 2243 97 %     Weight 02/20/24 2242 102.1 kg (225 lb)     Height 02/20/24 2242 1.651 m (5' 5)     Head Circumference --      Peak Flow --      Pain Score 02/20/24 2242 7     Pain Loc --      Pain Education --      Exclude from Growth Chart --      Most recent vital signs: Vitals:   02/21/24 0230 02/21/24 0239  BP: 94/62   Pulse: (!) 101 (!) 120  Resp: 18 16  Temp:    SpO2: 100% 100%    General: Awake, alert, moderate respiratory distress CV:  Good peripheral perfusion.  Tachycardia, normal heart sounds, regular rhythm. Resp:  Patient is splinting and tachypneic but has clear lung sounds and good breath sounds bilaterally with no wheezing, rales, nor rhonchi. Abd:  Obese, soft, no tenderness to palpation. Other:  No unilateral leg swelling or evidence of erythema or cellulitis.   ED Results / Procedures / Treatments   Labs (all labs ordered are listed, but only abnormal results are displayed) Labs Reviewed  BASIC METABOLIC PANEL WITH GFR - Abnormal; Notable for the following components:      Result Value   CO2 17 (*)    Glucose, Bld 111 (*)    Anion gap 17 (*)    All other components within normal limits  CBC - Abnormal; Notable for  the following components:   WBC 18.2 (*)    Platelets 408 (*)    All other components within normal limits  TROPONIN I (HIGH SENSITIVITY)  TROPONIN I (HIGH SENSITIVITY)     EKG  ED ECG REPORT I, Darleene Dome, the attending physician, personally viewed and interpreted this ECG.  Date: 02/20/2024 EKG Time: 22: 48 Rate: 164 Rhythm: Sinus tachycardia with short PR interval QRS Axis: normal Intervals: PR interval 94 ms, QTc 505 ms ST/T Wave abnormalities: Non-specific ST segment / T-wave changes, but no clear evidence of acute ischemia, possibly some rate related changes Narrative Interpretation: no definitive evidence of acute ischemia; does not meet STEMI criteria.    RADIOLOGY I independently viewed and interpreted the patient's chest x-ray and she has an elevated left hemidiaphragm that is new since the last x-ray.  Confirmed by the radiologist.   PROCEDURES:  Critical Care performed: No  .1-3 Lead EKG Interpretation  Performed by: Dome Darleene, MD Authorized  by: Dome Darleene, MD     Interpretation: abnormal     ECG rate:  150   ECG rate assessment: tachycardic     Rhythm: sinus tachycardia     Ectopy: none     Conduction: normal       IMPRESSION / MDM / ASSESSMENT AND PLAN / ED COURSE  I reviewed the triage vital signs and the nursing notes.                              Differential diagnosis includes, but is not limited to, pneumonia, atelectasis, mucous plugging or other bronchial/pleural obstruction, empyema or other abscess, diaphragmatic rupture, pulmonary embolism, anxiety/panic attack.  Patient's presentation is most consistent with acute presentation with potential threat to life or bodily function.  Labs/studies ordered: 1 view chest x-ray, EKG, BMP, CBC, high-sensitivity troponin.  See ED course for additional details.  Interventions/Medications given:  Medications  midazolam  (VERSED ) injection 2 mg (2 mg Intravenous Given 02/20/24 2345)  lactated ringers  bolus 1,000 mL (0 mLs Intravenous Stopped 02/21/24 0211)  iohexol  (OMNIPAQUE ) 350 MG/ML injection 100 mL (100 mLs Intravenous Contrast Given 02/21/24 0008)    (Note:  hospital course my include additional interventions and/or labs/studies not listed above.)   Patient is presenting with symptoms strongly consistent with acute panic/anxiety attack in the setting of relatively recent cardiothoracic surgery.  I reviewed the medical record including the discharge summary written by Dr. Marsa from 2 weeks ago which reported generally reassuring findings and workup.  I also reviewed the cardiothoracic surgery office note from about 10 days ago where she was essentially cleared from their service.  However, she is currently tachycardic and tachypneic, and as documented above, she has acute elevation of her left hemidiaphragm.  Her last imaging revealed some atelectasis in the left lower lobe and it is possible that this has gotten acutely worse or that it represents a new  pneumonia.  I am treating the acute issue with midazolam  2 mg IV but have her on monitoring and will provide 2 L of oxygen by nasal cannula for comfort.  Lab work is all essentially normal other than a leukocytosis of 18 which is somewhat nonspecific in this case.  I ordered a liter of LR IV bolus given the tachycardia and insensible fluid losses and I will proceed with CT imaging with a CTA chest to further evaluate the lung parenchyma as well as to rule out pulmonary embolism and CT abdomen/pelvis  to further evaluate the diaphragm and abdomen.  The patient is on the cardiac monitor to evaluate for evidence of arrhythmia and/or significant heart rate changes.   Clinical Course as of 02/21/24 0319  Fri Feb 21, 2024  0148 Troponin I (High Sensitivity): 4 Stable troponin [CF]  0312 CT Angio Chest PE W and/or Wo Contrast I independently viewed and interpreted the patient's CTA chest.  I see no evidence of pulmonary embolism or pneumonia.  Radiology confirmed no acute findings, some left lower lobe atelectasis which was apparently also seen previously but maybe a little bit more extensive this time. [CF]  [id number] I reassessed the patient.  She is feeling much better.  She is still splinting a bit but she says it is because of the pain that she has had since she had her pericardial cyst removed.  she is still tachycardic at around 110, but I believe this is due to the persistent discomfort and her splinting pattern of breathing rather than acute infection.  She has a leukocytosis of 18 but again I do not feel that this is consistent with acute infection particular given the generally reassuring CT scan.  Her decreased CO2 is almost certainly respiratory due to her tachypnea and hyperventilation.  She has not had any fever and her symptoms have been essentially constant since her surgery, worsened by the anxiety/panic attack tonight, much improved after the Versed .  The patient looks and feels very comfortable  and is comfortable going home after we talked about her symptoms.  She is now laughing and joking with me and is in no distress despite some persistent tachycardia.  I do not feel she would benefit from empiric antibiotics given no clear source of infection.  She is comfortable with the plan to follow-up as an outpatient which I think is reasonable.    The patient's medical screening exam is reassuring with no indication of an emergent medical condition requiring hospitalization or additional evaluation at this point.  The patient is safe and appropriate for discharge and outpatient follow up.  I gave strict return precautions [CF]    Clinical Course User Index [CF] Gordan Huxley, MD     FINAL CLINICAL IMPRESSION(S) / ED DIAGNOSES   Final diagnoses:  Dyspnea, unspecified type  Atypical chest pain  Tachycardia     Rx / DC Orders   ED Discharge Orders     None        Note:  This document was prepared using Dragon voice recognition software and may include unintentional dictation errors.   Gordan Huxley, MD 02/21/24 [id number]

## 2024-02-21 LAB — TROPONIN I (HIGH SENSITIVITY): Troponin I (High Sensitivity): 4 ng/L (ref <18)

## 2024-02-21 NOTE — Discharge Instructions (Signed)
 Your workup in the Emergency Department today was reassuring.  We did not find any specific abnormalities.  We recommend you drink plenty of fluids, take your regular medications and/or any new ones prescribed today, and follow up with the doctor(s) listed in these documents as recommended.  Please continue to use your incentive spirometer.  Return to the Emergency Department if you develop new or worsening symptoms that concern you.

## 2024-02-21 NOTE — ED Notes (Signed)
 Pt called out and requested blanket; warm blankets provided and lights dimmed for comfort, visitor at bedside. Denies other needs at this time. Call light within reach.  Float RN

## 2024-03-25 ENCOUNTER — Other Ambulatory Visit: Payer: Self-pay | Admitting: Nurse Practitioner

## 2024-03-25 DIAGNOSIS — Z79899 Other long term (current) drug therapy: Secondary | ICD-10-CM

## 2024-03-27 ENCOUNTER — Other Ambulatory Visit: Payer: Self-pay

## 2024-03-27 DIAGNOSIS — Z79899 Other long term (current) drug therapy: Secondary | ICD-10-CM

## 2024-04-01 ENCOUNTER — Encounter: Payer: Self-pay | Admitting: Nurse Practitioner

## 2024-04-01 ENCOUNTER — Ambulatory Visit (INDEPENDENT_AMBULATORY_CARE_PROVIDER_SITE_OTHER): Payer: MEDICAID | Admitting: Nurse Practitioner

## 2024-04-01 VITALS — BP 127/88 | HR 148 | Temp 96.6°F | Resp 16 | Ht 65.0 in | Wt 228.2 lb

## 2024-04-01 DIAGNOSIS — D72828 Other elevated white blood cell count: Secondary | ICD-10-CM | POA: Diagnosis not present

## 2024-04-01 DIAGNOSIS — Q248 Other specified congenital malformations of heart: Secondary | ICD-10-CM

## 2024-04-01 DIAGNOSIS — R1902 Left upper quadrant abdominal swelling, mass and lump: Secondary | ICD-10-CM | POA: Diagnosis not present

## 2024-04-01 DIAGNOSIS — Z79899 Other long term (current) drug therapy: Secondary | ICD-10-CM

## 2024-04-01 DIAGNOSIS — R1012 Left upper quadrant pain: Secondary | ICD-10-CM

## 2024-04-01 DIAGNOSIS — F411 Generalized anxiety disorder: Secondary | ICD-10-CM

## 2024-04-01 DIAGNOSIS — Z0001 Encounter for general adult medical examination with abnormal findings: Secondary | ICD-10-CM | POA: Diagnosis not present

## 2024-04-01 MED ORDER — MELOXICAM 15 MG PO TABS: 15.0000 mg | ORAL_TABLET | Freq: Every day | ORAL | 1 refills | Status: AC

## 2024-04-01 NOTE — Patient Instructions (Signed)
 Stop mirtazapine now Continue nortriptyline  and follow up with neurology Continue fluoxetine  and follow up with psychiatry.

## 2024-04-01 NOTE — Progress Notes (Signed)
 [facility] [address] [place], [place] [address]  Internal MEDICINE  Office Visit Note  Patient Name: [name]  [id number]  [id number]  Date of Service: 04/01/2024  Chief Complaint  Patient presents with   Depression   Gastroesophageal Reflux   Hyperlipidemia   Annual Exam    HPI Breelyn presents for an annual well visit and physical exam.  Well-appearing 38 y.o. female with heart palpitations, sinus tachycardia, pericardial cyst, migraines, asthma, bipolar, GAD, insomnia, and high cholesterol.  Routine mammogram: due for first routine mammogram next year  Pap smear: discontinued, hysterectomy.  Eye exam and/or foot exam: Labs: deferred, she has had a lot of labs drawn recently. No labs due at this time.  New or worsening pain: chronic and acute pain  Other concerns: high heart rate and intermittent chest pains, still being seen by cardiology frequently.  Tachycardia -- working with cardiology. Having intermittent chest pains.  LUQ abdominal pain, has a mass/bulge in this area that has not improved since her surgery and is painful and tender to touch.  Thyroid issues -- sees endocrinology Pericardial cyst was removed in July by cardiothoracic surgery.  Currently taking 3 antidepressants: she gets nortriptyline  from neurology for her seizures. She gets fluoxetine  from her psychiatrist for depression/anxiety. She was recently prescribed mirtazapine by the GI specialist for nausea but this is not an indication for this medication. This regimen also puts the patient at risk for serotonin syndrome. She stats that the mirtazapine has not helped with nausea or sleep.    Current Medication: Outpatient Encounter Medications as of 04/01/2024  Medication Sig   FLUoxetine  (PROZAC ) 20 MG capsule Take 20 mg by mouth daily.   traMADol  (ULTRAM ) 50 MG tablet Take 50 mg by mouth every 6 (six) hours as needed.   acetaminophen  (TYLENOL ) 325 MG tablet Take 2 tablets (650 mg  total) by mouth every 6 (six) hours as needed.   albuterol  (VENTOLIN  HFA) 108 (90 Base) MCG/ACT inhaler Inhale 2 puffs into the lungs every 4 (four) hours as needed for wheezing or shortness of breath.   busPIRone  (BUSPAR ) 15 MG tablet Take 15 mg by mouth 2 (two) times daily.   desonide  (DESOWEN ) 0.05 % ointment Apply 1 gram to the skin on both elbows daily as needed for dry skin/rash   dicyclomine  (BENTYL ) 20 MG tablet TAKE 1 TABLET BY MOUTH 4 TIMES DAILY BEFORE  MEALS  AND  AT  BEDTIME.  FOR  IBS   diltiazem  (CARDIZEM  CD) 180 MG 24 hr capsule Take 180 mg by mouth daily.   ferrous sulfate  325 (65 FE) MG EC tablet Take 325 mg by mouth daily.   fluticasone -salmeterol (ADVAIR HFA) 115-21 MCG/ACT inhaler Inhale 2 puffs into the lungs 2 (two) times daily.   folic acid  (FOLVITE ) 1 MG tablet Take 1 tablet (1 mg total) by mouth daily.   meloxicam  (MOBIC ) 15 MG tablet Take 1 tablet (15 mg total) by mouth daily.   metoprolol  succinate (TOPROL -XL) 25 MG 24 hr tablet Take 25 mg by mouth daily. (Patient taking differently: Take 50 mg by mouth daily.)   Multiple Vitamin (MULTIVITAMIN WITH MINERALS) TABS tablet Take 1 tablet by mouth daily.   nortriptyline  (PAMELOR ) 10 MG capsule Take 4 capsules (40 mg total) by mouth at bedtime for 14 days.   omeprazole  (PRILOSEC) 40 MG capsule TAKE 1 CAPSULE BY MOUTH ONCE DAILY FOR  HEARTBURN   promethazine  (PHENERGAN ) 12.5 MG tablet Take 1 tablet (12.5 mg total) by mouth every  6 (six) hours as needed for nausea or vomiting.   [DISCONTINUED] meloxicam  (MOBIC ) 15 MG tablet Take 1 tablet (15 mg total) by mouth daily.   [DISCONTINUED] mirtazapine (REMERON) 15 MG tablet Take 15 mg by mouth at bedtime.   [DISCONTINUED] traZODone  (DESYREL ) 100 MG tablet Take 100 mg by mouth at bedtime.   [DISCONTINUED] venlafaxine  XR (EFFEXOR -XR) 75 MG 24 hr capsule Take 75 mg by mouth at bedtime.   No facility-administered encounter medications on file as of 04/01/2024.    Surgical  History: Past Surgical History:  Procedure Laterality Date   ABDOMINAL HYSTERECTOMY     CARPAL TUNNEL RELEASE  2015   left   CYSTOSCOPY  11/27/2019   Procedure: CYSTOSCOPY;  Surgeon: Arloa Lamar SQUIBB, MD;  Location: [facility] ORS;  Service: Gynecology;;   ECTOPIC PREGNANCY SURGERY  2011   HERNIA REPAIR  2010   INTERCOSTAL NERVE BLOCK Left 01/20/2024   Procedure: BLOCK, NERVE, INTERCOSTAL;  Surgeon: Shyrl Linnie KIDD, MD;  Location: [facility] OR;  Service: Thoracic;  Laterality: Left;   TOTAL LAPAROSCOPIC HYSTERECTOMY WITH SALPINGECTOMY Bilateral 11/27/2019   Procedure: TOTAL LAPAROSCOPIC HYSTERECTOMY WITH SALPINGECTOMY;  Surgeon: Arloa Lamar SQUIBB, MD;  Location: [facility] ORS;  Service: Gynecology;  Laterality: Bilateral;  needs RNFA   TUBAL LIGATION     XI ROBOTIC ASSISTED PERICARDIAL WINDOW Left 01/20/2024   Procedure: ROBOT-ASSISTED THORASCOPY WITH REMOVAL OF PERICARDIAL CYST;  Surgeon: Shyrl Linnie KIDD, MD;  Location: [facility] OR;  Service: Thoracic;  Laterality: Left;  LEFT ROBOTIC RESECTION OF PERICARDIAL CYST    Medical History: Past Medical History:  Diagnosis Date   Allergy    Anxiety    Asthma 11/24/2019   blood allergy testing done 11/24/19 to see what causes it to flare up   Bipolar 1 disorder (HCC)    Bipolar disorder (HCC)    Bronchitis    Chronic cough    being worked up with Dr. Tamea   Depression    Dyspareunia in female 11/2019   Dysrhythmia    TACHY BUT NOT A PROBLEM   Ectopic pregnancy    had surgery   Family history of adverse reaction to anesthesia    mother had a hard time waking up   GERD (gastroesophageal reflux disease)    History of concussion 2016   Hyperlipidemia    Migraine    has an aura. happening frequently   MRSA (methicillin resistant Staphylococcus aureus)    was on chin.  many years ago   NSVD (normal spontaneous vaginal delivery)    x 2   Poor dentition    Pseudoseizures    related to anxiety   Scoliosis    Scoliosis     Family  History: Family History  Problem Relation Age of Onset   Asthma Mother    Heart disease Mother    Diabetes Mother    Hyperlipidemia Mother    Hypertension Mother    Mental illness Mother    Migraines Mother    Thyroid disease Mother    Stroke Mother    Alcohol abuse Mother    Bipolar disorder Mother    Anxiety disorder Mother    COPD Mother    Hypertension Sister    Depression Sister    Drug abuse Sister    Cancer Maternal Grandmother    Diabetes Maternal Grandmother    Heart disease Maternal Grandmother    Hyperlipidemia Maternal Grandmother    Hypertension Maternal Grandmother    Kidney disease Maternal Grandmother    Heart  disease Paternal Grandfather    Alcohol abuse Paternal Grandfather    Drug abuse Paternal Grandfather     Social History   Socioeconomic History   Marital status: Divorced    Spouse name: Not on file   Number of children: 2   Years of education: Not on file   Highest education level: GED or equivalent  Occupational History   Not on file  Tobacco Use   Smoking status: Former    Current packs/day: 0.00    Average packs/day: 1 pack/day for 15.0 years (15.0 ttl pk-yrs)    Types: Cigarettes    Start date: 09/07/2019    Quit date: 09/23/2019    Years since quitting: 4.5   Smokeless tobacco: Never   Tobacco comments:    did not want to quit but can't inhale w/ coughing  Vaping Use   Vaping status: Never Used  Substance and Sexual Activity   Alcohol use: No    Alcohol/week: 0.0 standard drinks of alcohol   Drug use: No   Sexual activity: Yes    Partners: Male    Birth control/protection: Surgical, None  Other Topics Concern   Not on file  Social History Narrative   Lives with fiance and 2 kids.   Social Drivers of Health   Financial Resource Strain: Medium Risk (12/17/2023)   Received from [facility]   Overall Financial Resource Strain (CARDIA)    Difficulty of Paying Living Expenses: Somewhat hard  Food Insecurity:  No Food Insecurity (01/27/2024)   Hunger Vital Sign    Worried About Running Out of Food in the Last Year: Never true    Ran Out of Food in the Last Year: Never true  Recent Concern: Food Insecurity - Food Insecurity Present (12/17/2023)   Received from [facility]   Hunger Vital Sign    Within the past 12 months, you worried that your food would run out before you got the money to buy more.: Sometimes true    Within the past 12 months, the food you bought just didn't last and you didn't have money to get more.: Sometimes true  Transportation Needs: No Transportation Needs (01/27/2024)   PRAPARE - Administrator, Civil Service (Medical): No    Lack of Transportation (Non-Medical): No  Physical Activity: Sufficiently Active (11/12/2019)   Exercise Vital Sign    Days of Exercise per Week: 5 days    Minutes of Exercise per Session: 80 min  Stress: Stress Concern Present (11/12/2019)   [employer] of Occupational Health - Occupational Stress Questionnaire    Feeling of Stress : Very much  Social Connections: Moderately Isolated (11/12/2019)   Social Connection and Isolation Panel    Frequency of Communication with Friends and Family: More than three times a week    Frequency of Social Gatherings with Friends and Family: Once a week    Attends Religious Services: Never    Database administrator or Organizations: No    Attends Banker Meetings: Never    Marital Status: Living with partner  Intimate Partner Violence: Not At Risk (01/27/2024)   Humiliation, Afraid, Rape, and Kick questionnaire    Fear of Current or Ex-Partner: No    Emotionally Abused: No    Physically Abused: No    Sexually Abused: No      Review of Systems  Constitutional: Negative.  Negative for activity change, appetite change, chills, fatigue, fever and unexpected weight change.  HENT:  Negative.  Negative for congestion, ear pain, rhinorrhea, sore throat and trouble  swallowing.   Eyes: Negative.   Respiratory: Negative.  Negative for cough, chest tightness, shortness of breath and wheezing.   Cardiovascular:  Positive for chest pain and palpitations.  Gastrointestinal:  Positive for abdominal distention, abdominal pain, diarrhea, nausea and vomiting. Negative for blood in stool and constipation.  Endocrine: Negative.   Genitourinary: Negative.  Negative for difficulty urinating, dysuria, frequency, hematuria and urgency.  Musculoskeletal:  Positive for arthralgias, back pain and myalgias. Negative for joint swelling and neck pain.  Skin: Negative.  Negative for rash and wound.  Allergic/Immunologic: Negative.  Negative for immunocompromised state.  Neurological: Negative.  Negative for dizziness, seizures, numbness and headaches.  Hematological: Negative.   Psychiatric/Behavioral: Negative.  Negative for behavioral problems, self-injury and suicidal ideas. The patient is not nervous/anxious.     Vital Signs: BP 127/88   Pulse (!) 148   Temp (!) 96.6 F (35.9 C)   Resp 16   Ht 5' 5 (1.651 m)   Wt 228 lb 3.2 oz (103.5 kg)   LMP 11/09/2019   SpO2 96%   BMI 37.97 kg/m    Physical Exam Vitals reviewed.  Constitutional:      General: She is not in acute distress.    Appearance: Normal appearance. She is obese. She is ill-appearing.  HENT:     Head: Normocephalic and atraumatic.     Right Ear: Tympanic membrane, ear canal and external ear normal. There is no impacted cerumen.     Left Ear: Tympanic membrane, ear canal and external ear normal. There is no impacted cerumen.     Nose: Nose normal. No congestion or rhinorrhea.     Mouth/Throat:     Mouth: Mucous membranes are moist.     Pharynx: Oropharynx is clear. No oropharyngeal exudate or posterior oropharyngeal erythema.  Eyes:     Extraocular Movements: Extraocular movements intact.     Conjunctiva/sclera: Conjunctivae normal.     Pupils: Pupils are equal, round, and reactive to light.   Cardiovascular:     Rate and Rhythm: Regular rhythm. Tachycardia present.     Pulses: Normal pulses.     Heart sounds: Normal heart sounds. No murmur heard. Pulmonary:     Effort: Pulmonary effort is normal. No respiratory distress.     Breath sounds: Normal breath sounds. No wheezing.  Abdominal:     General: Bowel sounds are normal. There is no distension.     Palpations: Abdomen is soft. There is mass (LUQ).     Tenderness: There is generalized abdominal tenderness and tenderness in the left upper quadrant.     Hernia: No hernia is present.  Musculoskeletal:        General: No swelling or tenderness. Normal range of motion.     Cervical back: Normal range of motion and neck supple.     Right lower leg: No edema.     Left lower leg: No edema.  Lymphadenopathy:     Cervical: No cervical adenopathy.  Skin:    General: Skin is warm and dry.     Capillary Refill: Capillary refill takes less than 2 seconds.  Neurological:     Mental Status: She is alert and oriented to person, place, and time.     Cranial Nerves: No cranial nerve deficit.     Coordination: Coordination normal.     Gait: Gait normal.  Psychiatric:        Mood and Affect: Mood  normal.        Behavior: Behavior normal.        Assessment/Plan: 1. Encounter for routine adult health examination with abnormal findings (Primary) Age-appropriate preventive screenings and vaccinations discussed, annual physical exam completed. Routine labs for health maintenance deferred. PHM updated.    2. Acute LUQ pain CT abdomen pelvis ordered  - CT ABDOMEN PELVIS WO CONTRAST; Future  3. Abdominal wall mass of left upper quadrant Ct abdomen/pelvis ordered  - CT ABDOMEN PELVIS WO CONTRAST; Future  4. Pericardial cyst, s/p thorascopy 01/20/2024 Followed by cardiothoracic surgery  5. Acquired neutrophilia Sees oncology   6. Encounter for medication review Continue meloxicam  as prescribed, refills ordered.  - meloxicam   (MOBIC ) 15 MG tablet; Take 1 tablet (15 mg total) by mouth daily.  Dispense: 90 tablet; Refill: 1  8. On selective serotonin reuptake inhibitor (SSRI) therapy STOP mirtazapine due to increased risk of serotonin syndrome. Continue fluoxetine  and nortriptyline  for now.      General Counseling: Aleli verbalizes understanding of the findings of todays visit and agrees with plan of treatment. I have discussed any further diagnostic evaluation that may be needed or ordered today. We also reviewed her medications today. she has been encouraged to call the office with any questions or concerns that should arise related to todays visit.    Orders Placed This Encounter  Procedures   CT ABDOMEN PELVIS WO CONTRAST    Meds ordered this encounter  Medications   meloxicam  (MOBIC ) 15 MG tablet    Sig: Take 1 tablet (15 mg total) by mouth daily.    Dispense:  90 tablet    Refill:  1    Fill for 90 days    Return in about 1 month (around 05/02/2024) for F/U, Kaleel Schmieder PCP CT scan results.   Total time spent:30 Minutes Time spent includes review of chart, medications, test results, and follow up plan with the patient.   [place] Controlled Substance Database was reviewed by me.  This patient was seen by Mardy Maxin, FNP-C in collaboration with Dr. Sigrid Bathe as a part of collaborative care agreement.  Eudell Mcphee R. Maxin, MSN, FNP-C Internal medicine

## 2024-04-08 ENCOUNTER — Other Ambulatory Visit: Payer: MEDICAID

## 2024-05-12 ENCOUNTER — Ambulatory Visit: Payer: MEDICAID | Admitting: Nurse Practitioner

## 2024-05-18 ENCOUNTER — Other Ambulatory Visit: Payer: Self-pay | Admitting: Nurse Practitioner

## 2024-05-18 DIAGNOSIS — Q248 Other specified congenital malformations of heart: Secondary | ICD-10-CM

## 2024-05-18 DIAGNOSIS — R1012 Left upper quadrant pain: Secondary | ICD-10-CM

## 2024-05-18 DIAGNOSIS — R1902 Left upper quadrant abdominal swelling, mass and lump: Secondary | ICD-10-CM

## 2024-05-19 ENCOUNTER — Telehealth: Payer: Self-pay | Admitting: Nurse Practitioner

## 2024-05-19 ENCOUNTER — Ambulatory Visit: Payer: MEDICAID | Admitting: Nurse Practitioner

## 2024-05-19 NOTE — Telephone Encounter (Signed)
Notified patient of U/S appointment date, arrival time, location and npo after midnight-[name] 

## 2024-05-20 ENCOUNTER — Ambulatory Visit
Admission: RE | Admit: 2024-05-20 | Discharge: 2024-05-20 | Disposition: A | Discharge status: Home / Self Care | Payer: MEDICAID | Source: Ambulatory Visit | Attending: Nurse Practitioner | Admitting: Nurse Practitioner

## 2024-05-20 DIAGNOSIS — R1012 Left upper quadrant pain: Secondary | ICD-10-CM | POA: Diagnosis present

## 2024-05-20 DIAGNOSIS — R1902 Left upper quadrant abdominal swelling, mass and lump: Secondary | ICD-10-CM | POA: Insufficient documentation

## 2024-05-20 DIAGNOSIS — Q248 Other specified congenital malformations of heart: Secondary | ICD-10-CM | POA: Insufficient documentation

## 2024-05-26 ENCOUNTER — Telehealth: Payer: MEDICAID | Admitting: Physician Assistant

## 2024-05-26 DIAGNOSIS — J029 Acute pharyngitis, unspecified: Secondary | ICD-10-CM | POA: Diagnosis not present

## 2024-05-26 MED ORDER — LIDOCAINE VISCOUS HCL 2 % MT SOLN: 15.0000 mL | OROMUCOSAL | 0 refills | Status: AC | PRN

## 2024-05-26 NOTE — Progress Notes (Signed)
 Virtual Visit Consent   [name], you are scheduled for a virtual visit with a [name] provider today. Just as with appointments in the office, your consent must be obtained to participate. Your consent will be active for this visit and any virtual visit you may have with one of our providers in the next 365 days. If you have a MyChart account, a copy of this consent can be sent to you electronically.  As this is a virtual visit, video technology does not allow for your provider to perform a traditional examination. This may limit your provider's ability to fully assess your condition. If your provider identifies any concerns that need to be evaluated in person or the need to arrange testing (such as labs, EKG, etc.), we will make arrangements to do so. Although advances in technology are sophisticated, we cannot ensure that it will always work on either your end or our end. If the connection with a video visit is poor, the visit may have to be switched to a telephone visit. With either a video or telephone visit, we are not always able to ensure that we have a secure connection.  By engaging in this virtual visit, you consent to the provision of healthcare and authorize for your insurance to be billed (if applicable) for the services provided during this visit. Depending on your insurance coverage, you may receive a charge related to this service.  I need to obtain your verbal consent now. Are you willing to proceed with your visit today? [name] has provided verbal consent on 05/26/2024 for a virtual visit (video or telephone). [name], [place]  Date: 05/26/2024 7:59 AM   Virtual Visit via Video Note   I, [name], connected with  THARON KITCH  ([id number], [birth year]) on 05/26/24 at  7:45 AM EST by a video-enabled telemedicine application and verified that I am speaking with the correct person using two identifiers.  Location: Patient: Virtual Visit  Location Patient: Home Provider: Virtual Visit Location Provider: Home Office   I discussed the limitations of evaluation and management by telemedicine and the availability of in person appointments. The patient expressed understanding and agreed to proceed.    History of Present Illness: [name] is a 38 y.o. who identifies as a female who was assigned female at birth, and is being seen today for substantial sore throat and voice hoarseness starting yesterday. Notes after being around some real Christmas tress which she has a sensitivity to. Denies nasal or head congestion but some PND with laying down. Denies ear pain, chest congestion or cough. Today with increased sore throat and hoarseness. Denies recent travel or sick contact. Some pain with swallowing but denies any actual difficulty swallowing.  Benadryl , Zyrtec OTC     HPI: HPI  Problems:  Patient Active Problem List   Diagnosis Date Noted   Pneumonia 01/27/2024   Left-sided chest pain 01/27/2024   COVID-19 virus infection 01/27/2024   Pericardial cyst, s/p thorascopy 01/20/2024 01/20/2024   Mediastinal mass 01/03/2024   Acquired neutrophilia 03/29/2023   Generalized anxiety disorder 08/03/2020   Insomnia due to mental condition 08/03/2020   S/P laparoscopic hysterectomy 12/25/2019   Pelvic pain 11/27/2019   Dyspareunia due to medical condition in female 11/27/2019   Dysmenorrhea 11/27/2019   Radial styloid tenosynovitis 10/13/2019   Bipolar affective disorder (HCC) 01/26/2015   Seizures (HCC) 01/12/2015   Asthma    Hyperlipidemia    Bipolar 2 disorder (HCC)  Allergy    Migraine headache with aura     Allergies:  Allergies  Allergen Reactions   Robitussin (Alcohol Free) [Guaifenesin ] Anaphylaxis   Strawberry Extract Rash    CAN'T EVEN TOUCH STRAWBERRIES   Aspirin Other (See Comments)    Nose bleeds   Chocolate Flavoring Agent (Non-Screening) Rash   Codeine Rash   Medications:  Current Outpatient  Medications:    lidocaine  (XYLOCAINE ) 2 % solution, Use as directed 15 mLs in the mouth or throat as needed., Disp: 200 mL, Rfl: 0   acetaminophen  (TYLENOL ) 325 MG tablet, Take 2 tablets (650 mg total) by mouth every 6 (six) hours as needed., Disp: , Rfl:    albuterol  (VENTOLIN  HFA) 108 (90 Base) MCG/ACT inhaler, Inhale 2 puffs into the lungs every 4 (four) hours as needed for wheezing or shortness of breath., Disp: 18 g, Rfl: 1   busPIRone  (BUSPAR ) 15 MG tablet, Take 15 mg by mouth 2 (two) times daily., Disp: , Rfl:    desonide  (DESOWEN ) 0.05 % ointment, Apply 1 gram to the skin on both elbows daily as needed for dry skin/rash, Disp: 60 g, Rfl: 3   dicyclomine  (BENTYL ) 20 MG tablet, TAKE 1 TABLET BY MOUTH 4 TIMES DAILY BEFORE  MEALS  AND  AT  BEDTIME.  FOR  IBS, Disp: 120 tablet, Rfl: 0   diltiazem  (CARDIZEM  CD) 180 MG 24 hr capsule, Take 180 mg by mouth daily., Disp: , Rfl:    ferrous sulfate  325 (65 FE) MG EC tablet, Take 325 mg by mouth daily., Disp: , Rfl:    FLUoxetine  (PROZAC ) 20 MG capsule, Take 20 mg by mouth daily., Disp: , Rfl:    fluticasone -salmeterol (ADVAIR HFA) 115-21 MCG/ACT inhaler, Inhale 2 puffs into the lungs 2 (two) times daily., Disp: 1 each, Rfl: 12   folic acid  (FOLVITE ) 1 MG tablet, Take 1 tablet (1 mg total) by mouth daily., Disp: 90 tablet, Rfl: 1   meloxicam  (MOBIC ) 15 MG tablet, Take 1 tablet (15 mg total) by mouth daily., Disp: 90 tablet, Rfl: 1   metoprolol  succinate (TOPROL -XL) 25 MG 24 hr tablet, Take 25 mg by mouth daily. (Patient taking differently: Take 50 mg by mouth daily.), Disp: , Rfl:    Multiple Vitamin (MULTIVITAMIN WITH MINERALS) TABS tablet, Take 1 tablet by mouth daily., Disp: , Rfl:    nortriptyline  (PAMELOR ) 10 MG capsule, Take 4 capsules (40 mg total) by mouth at bedtime for 14 days., Disp: 56 capsule, Rfl: 0   omeprazole  (PRILOSEC) 40 MG capsule, TAKE 1 CAPSULE BY MOUTH ONCE DAILY FOR  HEARTBURN, Disp: 90 capsule, Rfl: 0   promethazine  (PHENERGAN )  12.5 MG tablet, Take 1 tablet (12.5 mg total) by mouth every 6 (six) hours as needed for nausea or vomiting., Disp: 30 tablet, Rfl: 3  Observations/Objective: Patient is well-developed, well-nourished in no acute distress.  Resting comfortably  at home.  Head is normocephalic, atraumatic.  No labored breathing.  Speech is clear and coherent with logical content.  Patient is alert and oriented at baseline.  Oropharynx visualized on examination. No noted erythema, tonsillar swelling/exudate, or uvular edema or lesion.  Assessment and Plan: 1. Acute viral pharyngitis (Primary)  No alarm sings or symptoms present. Initial concern for mild allergy symptoms, but not having any substantial congestion, itchy/watery eyes, sneezing, etc. Suspect viral cause. Supportive measures and OTC medications reviewed. Viscous lidocaine  per orders. In person follow-up precautions reviewed.  Follow Up Instructions: I discussed the assessment and treatment plan with the patient.  The patient was provided an opportunity to ask questions and all were answered. The patient agreed with the plan and demonstrated an understanding of the instructions.  A copy of instructions were sent to the patient via MyChart unless otherwise noted below.   The patient was advised to call back or seek an in-person evaluation if the symptoms worsen or if the condition fails to improve as anticipated.    Danielle Velma Lunger, PA-C

## 2024-05-26 NOTE — Patient Instructions (Signed)
 [name], thank you for joining Danielle Velma Lunger, PA-C for today's virtual visit.  While this provider is not your primary care provider (PCP), if your PCP is located in our provider database this encounter information will be shared with them immediately following your visit.   A [facility] MyChart account gives you access to today's visit and all your visits, tests, and labs performed at [facility]  click here if you don't have a  MyChart account or go to [email or web address]  Consent: (Patient) [name] provided verbal consent for this virtual visit at the beginning of the encounter.  Current Medications:  Current Outpatient Medications:    acetaminophen  (TYLENOL ) 325 MG tablet, Take 2 tablets (650 mg total) by mouth every 6 (six) hours as needed., Disp: , Rfl:    albuterol  (VENTOLIN  HFA) 108 (90 Base) MCG/ACT inhaler, Inhale 2 puffs into the lungs every 4 (four) hours as needed for wheezing or shortness of breath., Disp: 18 g, Rfl: 1   busPIRone  (BUSPAR ) 15 MG tablet, Take 15 mg by mouth 2 (two) times daily., Disp: , Rfl:    desonide  (DESOWEN ) 0.05 % ointment, Apply 1 gram to the skin on both elbows daily as needed for dry skin/rash, Disp: 60 g, Rfl: 3   dicyclomine  (BENTYL ) 20 MG tablet, TAKE 1 TABLET BY MOUTH 4 TIMES DAILY BEFORE  MEALS  AND  AT  BEDTIME.  FOR  IBS, Disp: 120 tablet, Rfl: 0   diltiazem  (CARDIZEM  CD) 180 MG 24 hr capsule, Take 180 mg by mouth daily., Disp: , Rfl:    ferrous sulfate  325 (65 FE) MG EC tablet, Take 325 mg by mouth daily., Disp: , Rfl:    FLUoxetine  (PROZAC ) 20 MG capsule, Take 20 mg by mouth daily., Disp: , Rfl:    fluticasone -salmeterol (ADVAIR HFA) 115-21 MCG/ACT inhaler, Inhale 2 puffs into the lungs 2 (two) times daily., Disp: 1 each, Rfl: 12   folic acid  (FOLVITE ) 1 MG tablet, Take 1 tablet (1 mg total) by mouth daily., Disp: 90 tablet, Rfl: 1   meloxicam  (MOBIC ) 15 MG tablet, Take 1 tablet (15 mg total)  by mouth daily., Disp: 90 tablet, Rfl: 1   metoprolol  succinate (TOPROL -XL) 25 MG 24 hr tablet, Take 25 mg by mouth daily. (Patient taking differently: Take 50 mg by mouth daily.), Disp: , Rfl:    Multiple Vitamin (MULTIVITAMIN WITH MINERALS) TABS tablet, Take 1 tablet by mouth daily., Disp: , Rfl:    nortriptyline  (PAMELOR ) 10 MG capsule, Take 4 capsules (40 mg total) by mouth at bedtime for 14 days., Disp: 56 capsule, Rfl: 0   omeprazole  (PRILOSEC) 40 MG capsule, TAKE 1 CAPSULE BY MOUTH ONCE DAILY FOR  HEARTBURN, Disp: 90 capsule, Rfl: 0   promethazine  (PHENERGAN ) 12.5 MG tablet, Take 1 tablet (12.5 mg total) by mouth every 6 (six) hours as needed for nausea or vomiting., Disp: 30 tablet, Rfl: 3   traMADol  (ULTRAM ) 50 MG tablet, Take 50 mg by mouth every 6 (six) hours as needed., Disp: , Rfl:    Medications ordered in this encounter:  No orders of the defined types were placed in this encounter.    *If you need refills on other medications prior to your next appointment, please contact your pharmacy*  Follow-Up: Call back or seek an in-person evaluation if the symptoms worsen or if the condition fails to improve as anticipated.  [facility] [phone]  Other Instructions Pharyngitis  Pharyngitis is a sore throat (  pharynx). This is when there is redness, pain, and swelling in your throat. Most of the time, this condition gets better on its own. In some cases, you may need medicine. What are the causes? An infection from a virus. An infection from bacteria. Allergies. What increases the risk? Being 40-65 years old. Being in crowded environments. These include: Daycares. Schools. Dormitories. Living in a place with cold temperatures outside. Having a weakened disease-fighting (immune) system. What are the signs or symptoms? Symptoms may vary depending on the cause. Common symptoms include: Sore throat. Tiredness (fatigue). Low-grade fever. Stuffy  nose. Cough. Headache. Other symptoms may include: Glands in the neck (lymph nodes) that are swollen. Skin rashes. Film on the throat or tonsils. This can be caused by an infection from bacteria. Vomiting. Red, itchy eyes. Loss of appetite. Joint pain and muscle aches. Tonsils that are temporarily bigger than usual (enlarged). How is this treated? Many times, treatment is not needed. This condition usually gets better in 3-4 days without treatment. If the infection is caused by a bacteria, you may be need to take antibiotics. Follow these instructions at home: Medicines Take over-the-counter and prescription medicines only as told by your doctor. If you were prescribed an antibiotic medicine, take it as told by your doctor. Do not stop taking the antibiotic even if you start to feel better. Use throat lozenges or sprays to soothe your throat as told by your doctor. Children can get pharyngitis. Do not give your child aspirin. Managing pain To help with pain, try: Sipping warm liquids, such as: Broth. Herbal tea. Warm water. Eating or drinking cold or frozen liquids, such as frozen ice pops. Rinsing your mouth (gargle) with a salt water mixture 3-4 times a day or as needed. To make salt water, dissolve -1 tsp (3-6 g) of salt in 1 cup (237 mL) of warm water. Do not swallow this mixture. Sucking on hard candy or throat lozenges. Putting a cool-mist humidifier in your bedroom at night to moisten the air. Sitting in the bathroom with the door closed for 5-10 minutes while you run hot water in the shower.  General instructions  Do not smoke or use any products that contain nicotine  or tobacco. If you need help quitting, ask your doctor. Rest as told by your doctor. Drink enough fluid to keep your pee (urine) pale yellow. How is this prevented? Wash your hands often for at least 20 seconds with soap and water. If soap and water are not available, use hand sanitizer. Do not touch  your eyes, nose, or mouth with unwashed hands. Wash hands after touching these areas. Do not share cups or eating utensils. Avoid close contact with people who are sick. Contact a doctor if: You have large, tender lumps in your neck. You have a rash. You cough up green, yellow-brown, or bloody spit. Get help right away if: You have a stiff neck. You drool or cannot swallow liquids. You cannot drink or take medicines without vomiting. You have very bad pain that does not go away with medicine. You have problems breathing, and it is not from a stuffy nose. You have new pain and swelling in your knees, ankles, wrists, or elbows. These symptoms may be an emergency. Get help right away. Call your local emergency services (911 in the [place] Do not wait to see if the symptoms will go away. Do not drive yourself to the hospital. Summary Pharyngitis is a sore throat (pharynx). This is when there is redness, pain,  and swelling in your throat. Most of the time, pharyngitis gets better on its own. Sometimes, you may need medicine. If you were prescribed an antibiotic medicine, take it as told by your doctor. Do not stop taking the antibiotic even if you start to feel better. This information is not intended to replace advice given to you by your health care provider. Make sure you discuss any questions you have with your health care provider. Document Revised: 09/07/2020 Document Reviewed: 09/07/2020 [employer] Patient Education  2024 [employer]   If you have been instructed to have an in-person evaluation today at a local Urgent Care facility, please use the link below. It will take you to a list of all of our available [facility], including address, phone number and hours of operation. Please do not delay care.  [facility]  If you or a family member do not have a primary care provider, use the link below to schedule a visit and establish care. When you choose a [facility] primary care physician or advanced practice provider, you gain a long-term partner in health. Find a Primary Care Provider  Learn more about [place]'s in-office and virtual care options: [place] - Get Care Now

## 2024-06-03 ENCOUNTER — Ambulatory Visit: Payer: MEDICAID | Admitting: Nurse Practitioner

## 2024-06-03 ENCOUNTER — Telehealth: Payer: Self-pay | Admitting: Nurse Practitioner

## 2024-06-03 NOTE — Telephone Encounter (Signed)
 Patient [name] to r/s today's appointment. No answer when I returned her call. [name]-[name]

## 2024-06-07 ENCOUNTER — Other Ambulatory Visit: Payer: Self-pay | Admitting: Nurse Practitioner

## 2024-06-07 DIAGNOSIS — Z79899 Other long term (current) drug therapy: Secondary | ICD-10-CM

## 2024-11-04 ENCOUNTER — Encounter: Payer: MEDICAID | Admitting: Internal Medicine

## 2025-04-02 ENCOUNTER — Encounter: Payer: MEDICAID | Admitting: Nurse Practitioner
